# Patient Record
Sex: Female | Born: 1965
Health system: Southern US, Community
[De-identification: ages and names within clinical notes are randomized; demographics above are authoritative.]

## PROBLEM LIST (undated history)

## (undated) DIAGNOSIS — K219 Gastro-esophageal reflux disease without esophagitis: Secondary | ICD-10-CM

## (undated) DIAGNOSIS — K589 Irritable bowel syndrome without diarrhea: Secondary | ICD-10-CM

## (undated) DIAGNOSIS — E785 Hyperlipidemia, unspecified: Secondary | ICD-10-CM

## (undated) DIAGNOSIS — N939 Abnormal uterine and vaginal bleeding, unspecified: Secondary | ICD-10-CM

## (undated) DIAGNOSIS — K8020 Calculus of gallbladder without cholecystitis without obstruction: Secondary | ICD-10-CM

## (undated) DIAGNOSIS — F419 Anxiety disorder, unspecified: Secondary | ICD-10-CM

## (undated) DIAGNOSIS — I10 Essential (primary) hypertension: Secondary | ICD-10-CM

## (undated) DIAGNOSIS — E119 Type 2 diabetes mellitus without complications: Secondary | ICD-10-CM

## (undated) DIAGNOSIS — T7840XA Allergy, unspecified, initial encounter: Secondary | ICD-10-CM

## (undated) DIAGNOSIS — F909 Attention-deficit hyperactivity disorder, unspecified type: Secondary | ICD-10-CM

## (undated) HISTORY — DX: Gastro-esophageal reflux disease without esophagitis: K21.9

## (undated) HISTORY — DX: Hyperlipidemia, unspecified: E78.5

## (undated) HISTORY — DX: Allergy, unspecified, initial encounter: T78.40XA

## (undated) HISTORY — DX: Type 2 diabetes mellitus without complications: E11.9

---
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
## 1997-12-13 ENCOUNTER — Encounter: Admission: RE | Admit: 1997-12-13 | Discharge: 1997-12-13 | Payer: Self-pay | Admitting: Family Medicine

## 1998-01-26 ENCOUNTER — Encounter: Admission: RE | Admit: 1998-01-26 | Discharge: 1998-01-26 | Payer: Self-pay | Admitting: Family Medicine

## 1998-02-14 ENCOUNTER — Encounter: Admission: RE | Admit: 1998-02-14 | Discharge: 1998-02-14 | Payer: Self-pay | Admitting: *Deleted

## 1998-12-06 ENCOUNTER — Encounter: Admission: RE | Admit: 1998-12-06 | Discharge: 1998-12-06 | Payer: Self-pay | Admitting: Family Medicine

## 1999-01-31 ENCOUNTER — Encounter: Admission: RE | Admit: 1999-01-31 | Discharge: 1999-01-31 | Payer: Self-pay | Admitting: Family Medicine

## 1999-08-09 ENCOUNTER — Encounter: Admission: RE | Admit: 1999-08-09 | Discharge: 1999-08-09 | Payer: Self-pay | Admitting: Family Medicine

## 1999-09-18 ENCOUNTER — Encounter: Admission: RE | Admit: 1999-09-18 | Discharge: 1999-09-18 | Payer: Self-pay | Admitting: Family Medicine

## 1999-10-18 ENCOUNTER — Encounter: Admission: RE | Admit: 1999-10-18 | Discharge: 1999-10-18 | Payer: Self-pay | Admitting: Family Medicine

## 1999-11-14 ENCOUNTER — Encounter: Admission: RE | Admit: 1999-11-14 | Discharge: 1999-11-14 | Payer: Self-pay | Admitting: Family Medicine

## 1999-11-14 ENCOUNTER — Encounter: Admission: RE | Admit: 1999-11-14 | Discharge: 2000-02-12 | Payer: Self-pay | Admitting: *Deleted

## 1999-12-14 ENCOUNTER — Encounter: Admission: RE | Admit: 1999-12-14 | Discharge: 1999-12-14 | Payer: Self-pay | Admitting: Family Medicine

## 2000-02-07 ENCOUNTER — Encounter: Admission: RE | Admit: 2000-02-07 | Discharge: 2000-02-07 | Payer: Self-pay | Admitting: Family Medicine

## 2000-03-22 ENCOUNTER — Encounter: Admission: RE | Admit: 2000-03-22 | Discharge: 2000-03-22 | Payer: Self-pay | Admitting: Sports Medicine

## 2000-07-04 ENCOUNTER — Encounter: Admission: RE | Admit: 2000-07-04 | Discharge: 2000-07-04 | Payer: Self-pay | Admitting: Sports Medicine

## 2000-08-22 ENCOUNTER — Encounter: Admission: RE | Admit: 2000-08-22 | Discharge: 2000-08-22 | Payer: Self-pay | Admitting: Family Medicine

## 2001-01-13 ENCOUNTER — Encounter: Admission: RE | Admit: 2001-01-13 | Discharge: 2001-01-13 | Payer: Self-pay | Admitting: Family Medicine

## 2001-01-14 ENCOUNTER — Encounter: Admission: RE | Admit: 2001-01-14 | Discharge: 2001-01-14 | Payer: Self-pay | Admitting: Family Medicine

## 2001-01-29 ENCOUNTER — Inpatient Hospital Stay (HOSPITAL_COMMUNITY): Admission: AD | Admit: 2001-01-29 | Discharge: 2001-01-29 | Payer: Self-pay | Admitting: Obstetrics

## 2001-04-02 ENCOUNTER — Encounter: Admission: RE | Admit: 2001-04-02 | Discharge: 2001-04-02 | Payer: Self-pay | Admitting: Family Medicine

## 2001-08-22 ENCOUNTER — Encounter: Admission: RE | Admit: 2001-08-22 | Discharge: 2001-08-22 | Payer: Self-pay | Admitting: Family Medicine

## 2001-09-12 ENCOUNTER — Encounter: Admission: RE | Admit: 2001-09-12 | Discharge: 2001-09-12 | Payer: Self-pay | Admitting: Family Medicine

## 2001-12-26 ENCOUNTER — Encounter: Admission: RE | Admit: 2001-12-26 | Discharge: 2001-12-26 | Payer: Self-pay | Admitting: Family Medicine

## 2002-02-11 ENCOUNTER — Encounter: Admission: RE | Admit: 2002-02-11 | Discharge: 2002-02-11 | Payer: Self-pay | Admitting: Family Medicine

## 2002-03-09 ENCOUNTER — Inpatient Hospital Stay (HOSPITAL_COMMUNITY): Admission: AD | Admit: 2002-03-09 | Discharge: 2002-03-09 | Payer: Self-pay | Admitting: *Deleted

## 2002-03-20 ENCOUNTER — Encounter: Admission: RE | Admit: 2002-03-20 | Discharge: 2002-03-20 | Payer: Self-pay | Admitting: Family Medicine

## 2002-06-17 ENCOUNTER — Encounter: Admission: RE | Admit: 2002-06-17 | Discharge: 2002-06-17 | Payer: Self-pay | Admitting: Family Medicine

## 2002-08-08 ENCOUNTER — Emergency Department (HOSPITAL_COMMUNITY): Admission: EM | Admit: 2002-08-08 | Discharge: 2002-08-08 | Payer: Self-pay | Admitting: Emergency Medicine

## 2002-08-09 ENCOUNTER — Emergency Department (HOSPITAL_COMMUNITY): Admission: EM | Admit: 2002-08-09 | Discharge: 2002-08-09 | Payer: Self-pay | Admitting: Emergency Medicine

## 2002-11-16 ENCOUNTER — Encounter: Admission: RE | Admit: 2002-11-16 | Discharge: 2002-11-16 | Payer: Self-pay | Admitting: Family Medicine

## 2003-01-08 ENCOUNTER — Encounter: Admission: RE | Admit: 2003-01-08 | Discharge: 2003-01-08 | Payer: Self-pay | Admitting: Family Medicine

## 2003-02-19 ENCOUNTER — Emergency Department (HOSPITAL_COMMUNITY): Admission: EM | Admit: 2003-02-19 | Discharge: 2003-02-19 | Payer: Self-pay | Admitting: Emergency Medicine

## 2003-06-04 ENCOUNTER — Encounter: Admission: RE | Admit: 2003-06-04 | Discharge: 2003-06-04 | Payer: Self-pay | Admitting: Sports Medicine

## 2003-06-13 ENCOUNTER — Emergency Department (HOSPITAL_COMMUNITY): Admission: EM | Admit: 2003-06-13 | Discharge: 2003-06-13 | Payer: Self-pay | Admitting: Emergency Medicine

## 2003-07-08 ENCOUNTER — Observation Stay (HOSPITAL_COMMUNITY): Admission: EM | Admit: 2003-07-08 | Discharge: 2003-07-09 | Payer: Self-pay | Admitting: Emergency Medicine

## 2003-07-08 ENCOUNTER — Encounter: Payer: Self-pay | Admitting: Emergency Medicine

## 2003-07-16 ENCOUNTER — Ambulatory Visit: Admission: RE | Admit: 2003-07-16 | Discharge: 2003-07-16 | Payer: Self-pay | Admitting: Family Medicine

## 2003-07-19 ENCOUNTER — Encounter: Admission: RE | Admit: 2003-07-19 | Discharge: 2003-07-19 | Payer: Self-pay | Admitting: Family Medicine

## 2003-09-16 ENCOUNTER — Encounter: Admission: RE | Admit: 2003-09-16 | Discharge: 2003-09-16 | Payer: Self-pay | Admitting: Sports Medicine

## 2003-12-08 ENCOUNTER — Emergency Department (HOSPITAL_COMMUNITY): Admission: EM | Admit: 2003-12-08 | Discharge: 2003-12-08 | Payer: Self-pay | Admitting: Emergency Medicine

## 2003-12-09 ENCOUNTER — Encounter: Admission: RE | Admit: 2003-12-09 | Discharge: 2003-12-09 | Payer: Self-pay | Admitting: Family Medicine

## 2003-12-12 ENCOUNTER — Emergency Department (HOSPITAL_COMMUNITY): Admission: EM | Admit: 2003-12-12 | Discharge: 2003-12-12 | Payer: Self-pay | Admitting: *Deleted

## 2003-12-23 ENCOUNTER — Encounter: Admission: RE | Admit: 2003-12-23 | Discharge: 2003-12-23 | Payer: Self-pay | Admitting: Family Medicine

## 2004-02-29 ENCOUNTER — Encounter: Admission: RE | Admit: 2004-02-29 | Discharge: 2004-02-29 | Payer: Self-pay | Admitting: Family Medicine

## 2004-05-18 ENCOUNTER — Ambulatory Visit: Payer: Self-pay | Admitting: Family Medicine

## 2004-06-30 ENCOUNTER — Emergency Department (HOSPITAL_COMMUNITY): Admission: EM | Admit: 2004-06-30 | Discharge: 2004-06-30 | Payer: Self-pay | Admitting: Emergency Medicine

## 2004-07-10 ENCOUNTER — Encounter: Admission: RE | Admit: 2004-07-10 | Discharge: 2004-10-08 | Payer: Self-pay | Admitting: Family Medicine

## 2004-09-22 ENCOUNTER — Ambulatory Visit: Payer: Self-pay | Admitting: Family Medicine

## 2005-03-24 ENCOUNTER — Inpatient Hospital Stay (HOSPITAL_COMMUNITY): Admission: AD | Admit: 2005-03-24 | Discharge: 2005-03-24 | Payer: Self-pay | Admitting: Family Medicine

## 2005-03-29 ENCOUNTER — Ambulatory Visit: Payer: Self-pay

## 2005-06-01 ENCOUNTER — Emergency Department (HOSPITAL_COMMUNITY): Admission: EM | Admit: 2005-06-01 | Discharge: 2005-06-01 | Payer: Self-pay | Admitting: Emergency Medicine

## 2005-06-20 ENCOUNTER — Ambulatory Visit: Payer: Self-pay | Admitting: Family Medicine

## 2005-07-03 ENCOUNTER — Encounter: Admission: RE | Admit: 2005-07-03 | Discharge: 2005-07-03 | Payer: Self-pay | Admitting: Internal Medicine

## 2006-01-09 ENCOUNTER — Ambulatory Visit: Payer: Self-pay | Admitting: Family Medicine

## 2006-01-31 ENCOUNTER — Ambulatory Visit: Payer: Self-pay | Admitting: Family Medicine

## 2006-02-07 ENCOUNTER — Encounter (INDEPENDENT_AMBULATORY_CARE_PROVIDER_SITE_OTHER): Payer: Self-pay | Admitting: *Deleted

## 2006-02-07 LAB — CONVERTED CEMR LAB

## 2006-04-09 ENCOUNTER — Ambulatory Visit: Payer: Self-pay

## 2006-08-14 ENCOUNTER — Ambulatory Visit: Payer: Self-pay | Admitting: Sports Medicine

## 2006-09-17 ENCOUNTER — Ambulatory Visit: Payer: Self-pay | Admitting: Family Medicine

## 2006-09-25 ENCOUNTER — Ambulatory Visit: Payer: Self-pay | Admitting: Family Medicine

## 2006-10-04 ENCOUNTER — Encounter (INDEPENDENT_AMBULATORY_CARE_PROVIDER_SITE_OTHER): Payer: Self-pay | Admitting: Family Medicine

## 2006-10-04 ENCOUNTER — Ambulatory Visit: Payer: Self-pay | Admitting: Family Medicine

## 2006-10-17 ENCOUNTER — Ambulatory Visit: Payer: Self-pay | Admitting: Family Medicine

## 2006-10-31 DIAGNOSIS — F988 Other specified behavioral and emotional disorders with onset usually occurring in childhood and adolescence: Secondary | ICD-10-CM

## 2006-10-31 DIAGNOSIS — F900 Attention-deficit hyperactivity disorder, predominantly inattentive type: Secondary | ICD-10-CM

## 2006-10-31 DIAGNOSIS — F411 Generalized anxiety disorder: Secondary | ICD-10-CM

## 2006-10-31 DIAGNOSIS — F419 Anxiety disorder, unspecified: Secondary | ICD-10-CM | POA: Insufficient documentation

## 2006-10-31 HISTORY — DX: Attention-deficit hyperactivity disorder, predominantly inattentive type: F90.0

## 2006-11-01 ENCOUNTER — Encounter (INDEPENDENT_AMBULATORY_CARE_PROVIDER_SITE_OTHER): Payer: Self-pay | Admitting: *Deleted

## 2006-11-05 ENCOUNTER — Telehealth: Payer: Self-pay | Admitting: *Deleted

## 2006-11-13 ENCOUNTER — Ambulatory Visit: Payer: Self-pay | Admitting: Sports Medicine

## 2006-11-13 ENCOUNTER — Telehealth (INDEPENDENT_AMBULATORY_CARE_PROVIDER_SITE_OTHER): Payer: Self-pay | Admitting: *Deleted

## 2006-11-13 LAB — CONVERTED CEMR LAB: Hgb A1c MFr Bld: 7.1 %

## 2006-12-12 ENCOUNTER — Emergency Department (HOSPITAL_COMMUNITY): Admission: EM | Admit: 2006-12-12 | Discharge: 2006-12-12 | Payer: Self-pay | Admitting: Emergency Medicine

## 2006-12-12 ENCOUNTER — Telehealth (INDEPENDENT_AMBULATORY_CARE_PROVIDER_SITE_OTHER): Payer: Self-pay | Admitting: *Deleted

## 2006-12-13 ENCOUNTER — Telehealth: Payer: Self-pay | Admitting: *Deleted

## 2006-12-17 ENCOUNTER — Telehealth (INDEPENDENT_AMBULATORY_CARE_PROVIDER_SITE_OTHER): Payer: Self-pay | Admitting: Family Medicine

## 2006-12-17 ENCOUNTER — Ambulatory Visit: Payer: Self-pay | Admitting: Family Medicine

## 2006-12-17 DIAGNOSIS — Z794 Long term (current) use of insulin: Secondary | ICD-10-CM

## 2006-12-17 DIAGNOSIS — E119 Type 2 diabetes mellitus without complications: Secondary | ICD-10-CM | POA: Insufficient documentation

## 2006-12-17 HISTORY — DX: Type 2 diabetes mellitus without complications: Z79.4

## 2007-01-24 ENCOUNTER — Ambulatory Visit: Payer: Self-pay | Admitting: Family Medicine

## 2007-01-24 ENCOUNTER — Encounter: Payer: Self-pay | Admitting: *Deleted

## 2007-01-24 LAB — CONVERTED CEMR LAB: Whiff Test: NEGATIVE

## 2007-01-27 ENCOUNTER — Emergency Department (HOSPITAL_COMMUNITY): Admission: EM | Admit: 2007-01-27 | Discharge: 2007-01-27 | Payer: Self-pay | Admitting: Emergency Medicine

## 2007-01-31 ENCOUNTER — Ambulatory Visit: Payer: Self-pay | Admitting: Family Medicine

## 2007-02-04 ENCOUNTER — Telehealth (INDEPENDENT_AMBULATORY_CARE_PROVIDER_SITE_OTHER): Payer: Self-pay | Admitting: *Deleted

## 2007-02-16 ENCOUNTER — Telehealth (INDEPENDENT_AMBULATORY_CARE_PROVIDER_SITE_OTHER): Payer: Self-pay | Admitting: *Deleted

## 2007-02-19 ENCOUNTER — Encounter: Payer: Self-pay | Admitting: *Deleted

## 2007-02-19 ENCOUNTER — Telehealth: Payer: Self-pay | Admitting: *Deleted

## 2007-03-05 ENCOUNTER — Encounter (INDEPENDENT_AMBULATORY_CARE_PROVIDER_SITE_OTHER): Payer: Self-pay | Admitting: Family Medicine

## 2007-03-05 ENCOUNTER — Ambulatory Visit: Payer: Self-pay | Admitting: Family Medicine

## 2007-03-05 DIAGNOSIS — F3289 Other specified depressive episodes: Secondary | ICD-10-CM | POA: Insufficient documentation

## 2007-03-05 DIAGNOSIS — F329 Major depressive disorder, single episode, unspecified: Secondary | ICD-10-CM | POA: Insufficient documentation

## 2007-03-05 LAB — CONVERTED CEMR LAB
Hgb A1c MFr Bld: 11 %
Pap Smear: NORMAL
Pap Smear: NORMAL
Whiff Test: NEGATIVE

## 2007-03-06 ENCOUNTER — Telehealth: Payer: Self-pay | Admitting: *Deleted

## 2007-03-10 ENCOUNTER — Telehealth (INDEPENDENT_AMBULATORY_CARE_PROVIDER_SITE_OTHER): Payer: Self-pay | Admitting: *Deleted

## 2007-03-11 ENCOUNTER — Telehealth (INDEPENDENT_AMBULATORY_CARE_PROVIDER_SITE_OTHER): Payer: Self-pay | Admitting: Family Medicine

## 2007-03-11 ENCOUNTER — Encounter (INDEPENDENT_AMBULATORY_CARE_PROVIDER_SITE_OTHER): Payer: Self-pay | Admitting: Family Medicine

## 2007-03-19 ENCOUNTER — Telehealth (INDEPENDENT_AMBULATORY_CARE_PROVIDER_SITE_OTHER): Payer: Self-pay | Admitting: Family Medicine

## 2007-04-07 ENCOUNTER — Encounter (INDEPENDENT_AMBULATORY_CARE_PROVIDER_SITE_OTHER): Payer: Self-pay | Admitting: Family Medicine

## 2007-04-10 ENCOUNTER — Encounter (INDEPENDENT_AMBULATORY_CARE_PROVIDER_SITE_OTHER): Payer: Self-pay | Admitting: Family Medicine

## 2007-04-10 ENCOUNTER — Ambulatory Visit: Payer: Self-pay | Admitting: Family Medicine

## 2007-04-10 LAB — CONVERTED CEMR LAB: Whiff Test: NEGATIVE

## 2007-04-12 LAB — CONVERTED CEMR LAB
ALT: 12 units/L (ref 0–35)
AST: 11 units/L (ref 0–37)
Albumin: 4.3 g/dL (ref 3.5–5.2)
Alkaline Phosphatase: 59 units/L (ref 39–117)
BUN: 7 mg/dL (ref 6–23)
CO2: 24 meq/L (ref 19–32)
Calcium: 9.4 mg/dL (ref 8.4–10.5)
Chloride: 101 meq/L (ref 96–112)
Cholesterol: 202 mg/dL — ABNORMAL HIGH (ref 0–200)
Creatinine, Ser: 0.73 mg/dL (ref 0.40–1.20)
Glucose, Bld: 306 mg/dL — ABNORMAL HIGH (ref 70–99)
HDL: 62 mg/dL (ref >39)
LDL Cholesterol: 105 mg/dL — ABNORMAL HIGH (ref 0–99)
Potassium: 4.4 meq/L (ref 3.5–5.3)
Sodium: 137 meq/L (ref 135–145)
Total Bilirubin: 0.5 mg/dL (ref 0.3–1.2)
Total CHOL/HDL Ratio: 3.3
Total Protein: 7.3 g/dL (ref 6.0–8.3)
Triglycerides: 173 mg/dL — ABNORMAL HIGH (ref <150)
VLDL: 35 mg/dL (ref 0–40)

## 2007-06-23 ENCOUNTER — Telehealth: Payer: Self-pay | Admitting: *Deleted

## 2007-07-02 ENCOUNTER — Telehealth: Payer: Self-pay | Admitting: Psychology

## 2007-07-02 ENCOUNTER — Ambulatory Visit: Payer: Self-pay | Admitting: Family Medicine

## 2007-07-02 ENCOUNTER — Encounter (INDEPENDENT_AMBULATORY_CARE_PROVIDER_SITE_OTHER): Payer: Self-pay | Admitting: Family Medicine

## 2007-07-02 LAB — CONVERTED CEMR LAB: Hgb A1c MFr Bld: 8.9 %

## 2007-07-03 ENCOUNTER — Telehealth (INDEPENDENT_AMBULATORY_CARE_PROVIDER_SITE_OTHER): Payer: Self-pay | Admitting: Family Medicine

## 2007-07-03 LAB — CONVERTED CEMR LAB
Cholesterol: 171 mg/dL (ref 0–200)
HDL: 50 mg/dL (ref >39)
LDL Cholesterol: 99 mg/dL (ref 0–99)
Total CHOL/HDL Ratio: 3.4
Triglycerides: 111 mg/dL (ref <150)
VLDL: 22 mg/dL (ref 0–40)

## 2007-07-14 ENCOUNTER — Encounter: Payer: Self-pay | Admitting: Psychology

## 2007-07-22 ENCOUNTER — Telehealth: Payer: Self-pay | Admitting: *Deleted

## 2007-09-07 ENCOUNTER — Telehealth (INDEPENDENT_AMBULATORY_CARE_PROVIDER_SITE_OTHER): Payer: Self-pay | Admitting: Family Medicine

## 2007-09-08 ENCOUNTER — Ambulatory Visit: Payer: Self-pay | Admitting: Family Medicine

## 2007-09-08 LAB — CONVERTED CEMR LAB
Blood Glucose, AC Bkfst: 285 mg/dL
Whiff Test: NEGATIVE

## 2007-09-10 ENCOUNTER — Telehealth: Payer: Self-pay | Admitting: *Deleted

## 2007-09-18 ENCOUNTER — Encounter (INDEPENDENT_AMBULATORY_CARE_PROVIDER_SITE_OTHER): Payer: Self-pay | Admitting: Family Medicine

## 2007-09-18 ENCOUNTER — Encounter (INDEPENDENT_AMBULATORY_CARE_PROVIDER_SITE_OTHER): Payer: Self-pay | Admitting: *Deleted

## 2007-09-18 ENCOUNTER — Ambulatory Visit: Payer: Self-pay | Admitting: Family Medicine

## 2007-09-18 LAB — CONVERTED CEMR LAB: Hgb A1c MFr Bld: 9.3 %

## 2007-09-19 LAB — CONVERTED CEMR LAB
BUN: 7 mg/dL (ref 6–23)
CO2: 28 meq/L (ref 19–32)
Calcium: 9.8 mg/dL (ref 8.4–10.5)
Chloride: 102 meq/L (ref 96–112)
Creatinine, Ser: 0.79 mg/dL (ref 0.40–1.20)
Glucose, Bld: 132 mg/dL — ABNORMAL HIGH (ref 70–99)
Potassium: 4.6 meq/L (ref 3.5–5.3)
Sodium: 139 meq/L (ref 135–145)

## 2007-10-21 ENCOUNTER — Telehealth (INDEPENDENT_AMBULATORY_CARE_PROVIDER_SITE_OTHER): Payer: Self-pay | Admitting: Family Medicine

## 2007-10-21 ENCOUNTER — Encounter: Payer: Self-pay | Admitting: *Deleted

## 2007-10-22 ENCOUNTER — Telehealth (INDEPENDENT_AMBULATORY_CARE_PROVIDER_SITE_OTHER): Payer: Self-pay | Admitting: Family Medicine

## 2007-10-27 ENCOUNTER — Telehealth (INDEPENDENT_AMBULATORY_CARE_PROVIDER_SITE_OTHER): Payer: Self-pay | Admitting: Family Medicine

## 2007-11-24 ENCOUNTER — Ambulatory Visit: Payer: Self-pay | Admitting: Family Medicine

## 2007-11-24 LAB — CONVERTED CEMR LAB: Hgb A1c MFr Bld: 8.2 %

## 2007-11-25 ENCOUNTER — Telehealth (INDEPENDENT_AMBULATORY_CARE_PROVIDER_SITE_OTHER): Payer: Self-pay | Admitting: Family Medicine

## 2008-02-05 ENCOUNTER — Telehealth (INDEPENDENT_AMBULATORY_CARE_PROVIDER_SITE_OTHER): Payer: Self-pay | Admitting: *Deleted

## 2008-02-13 ENCOUNTER — Ambulatory Visit: Payer: Self-pay | Admitting: Family Medicine

## 2008-02-16 ENCOUNTER — Ambulatory Visit: Payer: Self-pay | Admitting: Family Medicine

## 2008-05-07 ENCOUNTER — Telehealth (INDEPENDENT_AMBULATORY_CARE_PROVIDER_SITE_OTHER): Payer: Self-pay | Admitting: Family Medicine

## 2008-05-25 ENCOUNTER — Encounter (INDEPENDENT_AMBULATORY_CARE_PROVIDER_SITE_OTHER): Payer: Self-pay | Admitting: *Deleted

## 2008-05-25 ENCOUNTER — Encounter: Payer: Self-pay | Admitting: *Deleted

## 2008-06-11 ENCOUNTER — Ambulatory Visit: Payer: Self-pay | Admitting: Family Medicine

## 2008-06-11 ENCOUNTER — Encounter (INDEPENDENT_AMBULATORY_CARE_PROVIDER_SITE_OTHER): Payer: Self-pay | Admitting: Family Medicine

## 2008-06-11 LAB — CONVERTED CEMR LAB
Bilirubin Urine: NEGATIVE
Blood in Urine, dipstick: NEGATIVE
Glucose, Urine, Semiquant: 500
Hgb A1c MFr Bld: 10.1 %
Ketones, urine, test strip: NEGATIVE
Nitrite: NEGATIVE
Pap Smear: NORMAL
Specific Gravity, Urine: 1.02
Urobilinogen, UA: 0.2
WBC Urine, dipstick: NEGATIVE
Whiff Test: POSITIVE
pH: 7.5

## 2008-06-18 ENCOUNTER — Encounter (INDEPENDENT_AMBULATORY_CARE_PROVIDER_SITE_OTHER): Payer: Self-pay | Admitting: Family Medicine

## 2008-09-10 ENCOUNTER — Encounter: Payer: Self-pay | Admitting: Family Medicine

## 2008-09-10 ENCOUNTER — Ambulatory Visit: Payer: Self-pay | Admitting: Family Medicine

## 2008-09-10 LAB — CONVERTED CEMR LAB
ALT: 11 units/L (ref 0–35)
AST: 11 units/L (ref 0–37)
Albumin: 4.3 g/dL (ref 3.5–5.2)
Alkaline Phosphatase: 52 units/L (ref 39–117)
Bilirubin, Direct: 0.1 mg/dL (ref 0.0–0.3)
Hgb A1c MFr Bld: 9.2 %
Indirect Bilirubin: 0.3 mg/dL (ref 0.0–0.9)
Total Bilirubin: 0.4 mg/dL (ref 0.3–1.2)
Total Protein: 6.9 g/dL (ref 6.0–8.3)

## 2008-09-14 ENCOUNTER — Encounter: Payer: Self-pay | Admitting: Family Medicine

## 2008-11-10 ENCOUNTER — Telehealth: Payer: Self-pay | Admitting: Family Medicine

## 2009-02-18 ENCOUNTER — Encounter (INDEPENDENT_AMBULATORY_CARE_PROVIDER_SITE_OTHER): Payer: Self-pay | Admitting: Family Medicine

## 2009-02-18 ENCOUNTER — Ambulatory Visit: Payer: Self-pay | Admitting: Family Medicine

## 2009-02-18 ENCOUNTER — Telehealth (INDEPENDENT_AMBULATORY_CARE_PROVIDER_SITE_OTHER): Payer: Self-pay | Admitting: Family Medicine

## 2009-02-18 LAB — CONVERTED CEMR LAB
Hgb A1c MFr Bld: 8.8 %
Whiff Test: NEGATIVE

## 2009-02-19 LAB — CONVERTED CEMR LAB
Cholesterol: 186 mg/dL (ref 0–200)
HDL: 55 mg/dL (ref >39)
LDL Cholesterol: 109 mg/dL — ABNORMAL HIGH (ref 0–99)
Total CHOL/HDL Ratio: 3.4
Triglycerides: 111 mg/dL (ref <150)
VLDL: 22 mg/dL (ref 0–40)

## 2009-02-26 ENCOUNTER — Encounter (INDEPENDENT_AMBULATORY_CARE_PROVIDER_SITE_OTHER): Payer: Self-pay | Admitting: Family Medicine

## 2009-03-01 ENCOUNTER — Telehealth: Payer: Self-pay | Admitting: Family Medicine

## 2009-04-07 ENCOUNTER — Inpatient Hospital Stay (HOSPITAL_COMMUNITY): Admission: AD | Admit: 2009-04-07 | Discharge: 2009-04-07 | Payer: Self-pay | Admitting: Obstetrics & Gynecology

## 2009-04-08 ENCOUNTER — Telehealth: Payer: Self-pay | Admitting: *Deleted

## 2009-04-13 ENCOUNTER — Ambulatory Visit: Payer: Self-pay | Admitting: Family Medicine

## 2009-04-13 DIAGNOSIS — E669 Obesity, unspecified: Secondary | ICD-10-CM

## 2009-04-13 HISTORY — DX: Obesity, unspecified: E66.9

## 2009-05-01 ENCOUNTER — Telehealth: Payer: Self-pay | Admitting: Family Medicine

## 2009-05-04 ENCOUNTER — Encounter: Payer: Self-pay | Admitting: Family Medicine

## 2009-05-13 ENCOUNTER — Telehealth: Payer: Self-pay | Admitting: Family Medicine

## 2009-05-15 ENCOUNTER — Emergency Department (HOSPITAL_COMMUNITY): Admission: EM | Admit: 2009-05-15 | Discharge: 2009-05-15 | Payer: Self-pay | Admitting: Emergency Medicine

## 2009-05-18 ENCOUNTER — Ambulatory Visit: Payer: Self-pay | Admitting: Family Medicine

## 2009-05-18 LAB — CONVERTED CEMR LAB: Hgb A1c MFr Bld: 8.4 %

## 2009-05-30 ENCOUNTER — Encounter: Payer: Self-pay | Admitting: Family Medicine

## 2009-06-08 ENCOUNTER — Encounter: Payer: Self-pay | Admitting: Family Medicine

## 2009-07-07 ENCOUNTER — Encounter: Payer: Self-pay | Admitting: Family Medicine

## 2009-07-07 ENCOUNTER — Ambulatory Visit: Payer: Self-pay | Admitting: Family Medicine

## 2009-07-07 DIAGNOSIS — I10 Essential (primary) hypertension: Secondary | ICD-10-CM

## 2009-07-07 HISTORY — DX: Essential (primary) hypertension: I10

## 2009-07-07 LAB — CONVERTED CEMR LAB
ALT: 16 units/L (ref 0–35)
AST: 15 units/L (ref 0–37)
Albumin: 4.2 g/dL (ref 3.5–5.2)
Alkaline Phosphatase: 53 units/L (ref 39–117)
BUN: 7 mg/dL (ref 6–23)
Beta hcg, urine, semiquantitative: NEGATIVE
CO2: 23 meq/L (ref 19–32)
Calcium: 9.3 mg/dL (ref 8.4–10.5)
Chloride: 103 meq/L (ref 96–112)
Creatinine, Ser: 0.76 mg/dL (ref 0.40–1.20)
Glucose, Bld: 289 mg/dL — ABNORMAL HIGH (ref 70–99)
HCT: 38.2 % (ref 36.0–46.0)
Hemoglobin: 12.2 g/dL (ref 12.0–15.0)
MCHC: 31.9 g/dL (ref 30.0–36.0)
MCV: 87 fL (ref 78.0–100.0)
Platelets: 383 10*3/uL (ref 150–400)
Potassium: 4.7 meq/L (ref 3.5–5.3)
RBC: 4.39 M/uL (ref 3.87–5.11)
RDW: 13 % (ref 11.5–15.5)
Sodium: 137 meq/L (ref 135–145)
Total Bilirubin: 0.4 mg/dL (ref 0.3–1.2)
Total Protein: 7.1 g/dL (ref 6.0–8.3)
WBC: 6.3 10*3/uL (ref 4.0–10.5)

## 2009-07-20 ENCOUNTER — Ambulatory Visit: Payer: Self-pay | Admitting: Family Medicine

## 2009-08-04 ENCOUNTER — Encounter: Payer: Self-pay | Admitting: Family Medicine

## 2009-08-04 ENCOUNTER — Ambulatory Visit: Payer: Self-pay | Admitting: Family Medicine

## 2009-08-04 ENCOUNTER — Telehealth: Payer: Self-pay | Admitting: Family Medicine

## 2009-08-04 LAB — CONVERTED CEMR LAB
Beta hcg, urine, semiquantitative: NEGATIVE
Bilirubin Urine: NEGATIVE
Blood in Urine, dipstick: NEGATIVE
Glucose, Urine, Semiquant: NEGATIVE
Ketones, urine, test strip: NEGATIVE
Nitrite: NEGATIVE
Protein, U semiquant: NEGATIVE
Specific Gravity, Urine: 1.02
Urobilinogen, UA: 0.2
WBC Urine, dipstick: NEGATIVE
pH: 7

## 2009-08-05 ENCOUNTER — Encounter: Payer: Self-pay | Admitting: Family Medicine

## 2009-08-05 ENCOUNTER — Ambulatory Visit: Payer: Self-pay | Admitting: Family Medicine

## 2009-08-05 ENCOUNTER — Ambulatory Visit (HOSPITAL_COMMUNITY): Admission: RE | Admit: 2009-08-05 | Discharge: 2009-08-05 | Payer: Self-pay | Admitting: Family Medicine

## 2009-08-05 LAB — CONVERTED CEMR LAB
HCT: 36.3 % (ref 36.0–46.0)
Hemoglobin: 12.4 g/dL (ref 12.0–15.0)
MCHC: 34.2 g/dL (ref 30.0–36.0)
MCV: 85.4 fL (ref 78.0–100.0)
Platelets: 346 10*3/uL (ref 150–400)
RBC: 4.25 M/uL (ref 3.87–5.11)
RDW: 13.1 % (ref 11.5–15.5)
WBC: 6.3 10*3/uL (ref 4.0–10.5)

## 2009-08-15 ENCOUNTER — Ambulatory Visit (HOSPITAL_COMMUNITY): Admission: RE | Admit: 2009-08-15 | Discharge: 2009-08-15 | Payer: Self-pay | Admitting: Family Medicine

## 2009-09-19 ENCOUNTER — Encounter: Payer: Self-pay | Admitting: Family Medicine

## 2009-11-01 ENCOUNTER — Emergency Department (HOSPITAL_COMMUNITY): Admission: EM | Admit: 2009-11-01 | Discharge: 2009-11-01 | Payer: Self-pay | Admitting: Emergency Medicine

## 2009-11-02 ENCOUNTER — Ambulatory Visit: Payer: Self-pay | Admitting: Radiology

## 2009-11-02 ENCOUNTER — Emergency Department (HOSPITAL_BASED_OUTPATIENT_CLINIC_OR_DEPARTMENT_OTHER): Admission: EM | Admit: 2009-11-02 | Discharge: 2009-11-02 | Payer: Self-pay | Admitting: Emergency Medicine

## 2009-12-15 ENCOUNTER — Ambulatory Visit (HOSPITAL_COMMUNITY): Admission: RE | Admit: 2009-12-15 | Discharge: 2009-12-15 | Payer: Self-pay | Admitting: Orthopedic Surgery

## 2009-12-29 ENCOUNTER — Encounter: Admission: RE | Admit: 2009-12-29 | Discharge: 2010-03-29 | Payer: Self-pay | Admitting: Orthopedic Surgery

## 2010-02-03 ENCOUNTER — Ambulatory Visit: Payer: Self-pay | Admitting: Family Medicine

## 2010-02-03 LAB — CONVERTED CEMR LAB: Hgb A1c MFr Bld: 8.4 %

## 2010-04-14 ENCOUNTER — Telehealth: Payer: Self-pay | Admitting: Family Medicine

## 2010-04-19 ENCOUNTER — Encounter: Payer: Self-pay | Admitting: Family Medicine

## 2010-05-02 ENCOUNTER — Telehealth: Payer: Self-pay | Admitting: *Deleted

## 2010-05-05 ENCOUNTER — Ambulatory Visit: Payer: Self-pay | Admitting: Family Medicine

## 2010-05-05 LAB — CONVERTED CEMR LAB: Whiff Test: NEGATIVE

## 2010-06-08 ENCOUNTER — Encounter: Payer: Self-pay | Admitting: Family Medicine

## 2010-06-09 ENCOUNTER — Encounter: Payer: Self-pay | Admitting: Family Medicine

## 2010-06-13 ENCOUNTER — Encounter: Payer: Self-pay | Admitting: Family Medicine

## 2010-06-22 ENCOUNTER — Encounter: Payer: Self-pay | Admitting: Family Medicine

## 2010-07-04 ENCOUNTER — Telehealth: Payer: Self-pay | Admitting: Psychology

## 2010-07-04 ENCOUNTER — Ambulatory Visit: Payer: Self-pay | Admitting: Family Medicine

## 2010-07-04 LAB — CONVERTED CEMR LAB
Glucose, Urine, Semiquant: 100
Hgb A1c MFr Bld: 9.5 %
Protein, U semiquant: NEGATIVE

## 2010-08-08 ENCOUNTER — Telehealth: Payer: Self-pay | Admitting: *Deleted

## 2010-08-25 ENCOUNTER — Telehealth: Payer: Self-pay | Admitting: Sports Medicine

## 2010-09-24 ENCOUNTER — Encounter: Payer: Self-pay | Admitting: Family Medicine

## 2010-09-28 ENCOUNTER — Telehealth: Payer: Self-pay | Admitting: Family Medicine

## 2010-09-28 ENCOUNTER — Encounter: Payer: Self-pay | Admitting: Family Medicine

## 2010-10-02 ENCOUNTER — Telehealth: Payer: Self-pay | Admitting: *Deleted

## 2010-10-02 ENCOUNTER — Telehealth: Payer: Self-pay | Admitting: Family Medicine

## 2010-10-03 NOTE — Progress Notes (Signed)
Summary: Triage  Phone Note Call from Patient Call back at Home Phone [phone]   Summary of Call: Pt wants to speak with a nurse about feeling sick still.  Initial call taken by: [name],  September 10, 2007 2:44 PM  Follow-up for Phone Call        pt is calling about getting a doctor's note, sts she is still out of work and needs a work excuse note b/c she is sick Follow-up by: Fontaine No,  September 10, 2007 3:51 PM  Additional Follow-up for Phone Call Additional follow up Details #1::        message left to return call. Additional Follow-up by: Theresia Lo RN,  September 10, 2007 4:04 PM    Additional Follow-up for Phone Call Additional follow up Details #2::    pt came by office requesting note to be out of work today as she has not felt any better. states sugars continue to be elevated. continues with diarrhea.Marland Kitchen advised to push fluids. Consulted with Dr. Sheffield Slider and advises an additional note can be given for today  for being out of work. pt plans to return to work tomorrow. she will call tomorrow if not any better. Follow-up by: Theresia Lo RN,  September 10, 2007 4:38 PM

## 2010-10-03 NOTE — Progress Notes (Signed)
Summary: On call note - increased BP and syncope  Phone Note Call from Patient Call back at [facility] Phone [phone]   Caller: Patient Call For: On call physician  Summary of Call: 45 yo with DM. Sugars are in 300s. Had brief syncopal episode around 1:40 today. None since that time. No CP. No SOB. blood sugar checked at that time and found to be 350s. Took 20 units of her mom's levemir. Ran out of lantus on Friday. Did not take any insulin on Sat. Unable to afford Byetta. Taking metforming two times a day. Thinks she has a yeast infection and wants to be treated but was advised to come in for a swab by Dr. [name] prior to treatment. Unable to afford solostar pen. Drinking lots of water. Very thirsty. Current blood sugar is 338. Advised to take another 10 units of levemir as well as normal dose of Metformin and to schedule appointment in AM for clinic. If has another syncopal spell, feeling worse, or CP or SOB is to come into the ED tonight.  Initial call taken by: Alanda Amass MD,  September 07, 2007 9:11 PM

## 2010-10-03 NOTE — Assessment & Plan Note (Signed)
Summary: elevated blood sugar/ACM   Vital Signs:  Patient Profile:   45 Years Old Female Weight:      165.7 pounds Temp:     97.7 degrees F Pulse rate:   85 / minute BP sitting:   115 / 76  Pt. in pain?   no  Vitals Entered By: Dedra Skeens CMA, (November 13, 2006 2:25 PM)                Diabetes Management History:      The patient is a 45 years old female who comes in for evaluation of DM Type 2.  She states lack of understanding of dietary principles and is not following her diet appropriately.  No sensory loss is reported.  She is checking home blood sugars.        Hypoglycemic symptoms are not occurring.  No hyperglycemic symptoms are reported.        There are no symptoms to suggest diabetic complications.  Other questions/concerns include: Pt complains of BS running in 170-200 fasting for the last few months.  Has been using Byetta 10 two times a day and metformin 500 two times a day.  The following changes have been made to her treatment plan since last visit: insulin dosing and medication changes.  Treatment plan changes were initiated by D.E.P..  Dietary compliance is reported to be good.          Physical Exam  General:     Well-developed,well-nourished,in no acute distress; alert,appropriate and cooperative throughout examination Head:     Normocephalic and atraumatic without obvious abnormalities. No apparent alopecia or balding. Eyes:     vision grossly intact and pupils equal.   Lungs:     Normal respiratory effort, chest expands symmetrically. Lungs are clear to auscultation, no crackles or wheezes. Heart:     Normal rate and regular rhythm. S1 and S2 normal without gallop, murmur, click, rub or other extra sounds. Neurologic:     alert & oriented X3 and gait normal.   Psych:     Cognition and judgment appear intact. Alert and cooperative with normal attention span and concentration. No apparent delusions, illusions, hallucinations    Impression &  Recommendations:  Problem # 1:  AODM (ICD-250.00) Assessment: Unchanged Pt doing well. close to goal A1C despited reported high sugars. would like to see fasting CBG even lower.  Will try to increase metformin to 1000 two times a day. no change to byetta at this time. Pt to follow up with Dr. [name] for further adjustment of meds.  Spent good amoutn of time on counseling pt on carbohydrates and how they afffect diabetes. she was also given a handout.  If her understanding of carbohydrates is still limited at next visit, she may benefit from nutrition counseling. Orders: Hgb-A1C-[facility] (11914) [facility]- Est Level  3 ([address])   Diabetes Management Assessment/Plan:      The following lipid goals have been established for the patient: Total cholesterol goal of 200; LDL cholesterol goal of 100; HDL cholesterol goal of 40; Triglyceride goal of 200.     Patient Instructions: 1)  Please schedule a follow-up appointment in 1 month with Dr. [name]   Laboratory Results   Blood Tests   Date/Time Recieved: November 13, 2006 2:34 PM  Date/Time Reported: November 13, 2006 2:40 PM   HGBA1C: 7.1%   (Normal Range: Non-Diabetic - 3-6%   Control Diabetic - 6-8%)  CBC

## 2010-10-03 NOTE — Assessment & Plan Note (Signed)
Summary: taking NPH but not working as well  Phone Note Call from Patient Call back at [phone]   Caller: Patient Summary of Call: Would like to talk to a nurse concerning her insulin.  Follow-up for Phone Call        Tracy Weiss comes to office to discuss insulins. states her BS have been running 300-400. she has been unable to afford  lantus which costs $100.00  so she has been using NPH insulin  instead  because MD told her she could do this. she can buy  this for $25.00 she states. she takes 60 units twice daily.  states BS ran much better with lantus  . also she has no novolog either and has been using regular insulin based on a sliding scale she has. she is asking for samples of Lantus and Novolog. states BS ran much better when she takes Lantus instead of NPH.  gave her 1 sample today of   lantus and Novolog each. these are on her med list . the lantus will last 6 days and she will come back next week for more sample if available.   patient has appointment on 07/04/2010. since patient is unable to afford Lantus and can better afford NPH will ask MD to  adjust that dose  at next visit. patient had been taking 60 units twice daily of NPH. also will ask MD to enter on med list if she does change her to NPH and regular insulin.    patient brought in a vial of 70/30 mix  that she had ask pharmacist about.  advised her not to use this. needs to discuss this with MD and to work out a regime that she can afford.   consulted  and discussed all above with Dr. Gerri Spore, preceptor. Follow-up by: Theresia Lo RN,  June 22, 2010 11:35 AM  Additional Follow-up for Phone Call Additional follow up Details #1::        noted, this patient would likely benefit from working with pharmacy clinic to get her insulin adjusted properly.  Additional Follow-up by: Jamie Brookes MD,  June 24, 2010 6:01 PM    lantus 1 sample lot # [id number] exp 11/2011, Novolog 1 sample lot # [id number] exp 7/ 2012. Theresia Lo RN   June 22, 2010 11:59 AM

## 2010-10-03 NOTE — Progress Notes (Signed)
  Phone Note Call from Patient   Caller: Patient Summary of Call: Want a call back to inform her of what shots she's had from [facility].  She need the info by the 6th.  Have an appt with her doctor's office on 9/2, but don't want to wait until then to know info.  [phone] Initial call taken by: Britta Mccreedy mcgregor  Follow-up for Phone Call        told her we will have it when she comes in Fri. she wants it done now. unable to get on [facility]. told her I will keep trying & will call her when printed. I confirmed her appt on friday Follow-up by: Golden Circle RN,  May 02, 2010 4:45 PM  Additional Follow-up for Phone Call Additional follow up Details #1::         patient had an adult DT 06/03/2002 and Hep B #1 02/13/2008. there is no [place] in [place] attempted to call patient back at number  left, but no answer. Additional Follow-up by: Theresia Lo RN,  May 02, 2010 5:06 PM    Additional Follow-up for Phone Call Additional follow up Details #2::    spoke with patient and gave  her information. when she comes in Friday will need to continue the hep B series , needs tdap and she states she needs MMR .

## 2010-10-03 NOTE — Progress Notes (Signed)
Summary: [place] request  Phone Note Call from Patient Call back at Home Phone [phone]   Summary of Call: pt is wanting to speak with someone about a possible yeast infection Initial call taken by: [name],  March 10, 2007 8:47 AM  Follow-up for Phone Call        Spoke with pt regarding her vaginal itching - she states that she has been experiencing severe vaginal itching for 3 weeks - had a wet prep done last week here in office that was negative - but still has severe itching.  Worked her in with Dr. Beverely Low tomorrow. Follow-up by: AMY MARTIN RN,  March 10, 2007 9:14 AM

## 2010-10-03 NOTE — Miscellaneous (Signed)
Summary: dnka/ts  Clinical Lists Changes 

## 2010-10-03 NOTE — Letter (Signed)
Summary: Results Follow-up Letter  [facility]  [address]   [place], [place] [address]   Phone: [phone]  Fax: [phone]    09/14/2008  [address] [place], [place]  [address]  Dear Ms. Sugarman,   Your liver tests came back normal.  This is good news.  I hope you are feeling better.  Please call with any questions.  Sincerely,  Ruthe Mannan MD [facility]           Appended Document: Results Follow-up Letter mailed

## 2010-10-03 NOTE — Progress Notes (Signed)
Summary: Request mental health services  Phone Note Call from Patient   Caller: Patient Call For: Spero Geralds, Psy.D. Summary of Call: Patient called and left a VM to schedule an appt.  Called back and left a VM.  Will await follow-up. Initial call taken by: Spero Geralds PsyD,  July 04, 2010 4:57 PM

## 2010-10-03 NOTE — Progress Notes (Signed)
Summary: returning Amy's call  Phone Note Outgoing Call   Details for Reason: appt Summary of Call: I recommend an upcoming appointment to f/u on diabetes.  I understand finances are a concern.   Pt should call Rudell Cobb to discuss the [facility] plan if she will be uninsured.   In the meantime, if pt does not want to come for f/u visit, she should check her sugars fasting each morning and one time daily 2 hours post prandial and WRITE THEM DOWN for a week.  She may then drop off log and I can review them and change meds based on what I see.  Please have Keniya write down what her dose of Lantus is on these days as she should be self adjusting.  Pt should continue exercise and diet as per our previous discussions.  Please notify her of this as I will be away until Wed. Initial call taken by: Johney Maine MD,  October 22, 2007 8:00 PM  Follow-up for Phone Call        Left pt a voicemail to return my call. Follow-up by: AMY MARTIN RN,  October 23, 2007 12:04 PM  Additional Follow-up for Phone Call Additional follow up Details #1::        Left pt a voicemail to return my call. Additional Follow-up by: AMY MARTIN RN,  October 24, 2007 12:02 PM    Additional Follow-up for Phone Call Additional follow up Details #2::    pt is returning call Follow-up by: ERIN LEVAN,  October 24, 2007 12:09 PM  Additional Follow-up for Phone Call Additional follow up Details #3:: Details for Additional Follow-up Action Taken: Spoke with pt advised her of above schedule for checking her blood sugars and dropping log off here for Dr. [name].  Pt states she needs a new rx with the adjusted lantus dose - (pt now taking 50 units per day) because she has been out for approx 2 weeks due to increasing her dose.  Advised pt Dr. [name] is out of town until next week, but I gave her 2 lantus solostar pens so that she would have the med until Dr. [name] gets back and does her rx.  Pt to pick up lantus today before 5 pm.   12:55pm: Amy, I went ahead and refilled her [facility] [address] ([facility] sent there) and increased the dose to 50 units daily.  Please let her know  that this has been done. Also, tell her to call 5 days before running out as can be dangerous to be out of insulin.  Thanks!  Jess Additional Follow-up by: AMY MARTIN RN,  October 24, 2007 12:20 PM  New/Updated Medications: LANTUS SOLOSTAR 100 UNIT/ML  SOLN (INSULIN GLARGINE) inject 50 units subcutaneously once daily   Prescriptions: LANTUS SOLOSTAR 100 UNIT/ML  SOLN (INSULIN GLARGINE) inject 50 units subcutaneously once daily  #999 x 6   Entered and Authorized by:   Johney Maine MD   Signed by:   Johney Maine MD on 10/24/2007   Method used:   Electronically sent to ...       [facility] [address] [phone]*       [address]       [place], [place]  [address]       Ph: [phone] or [phone]       Fax: [phone]   RxID:   [id number]    Spoke with pt- advised  her rx was sent - and also to call in advance of running out of insulin because this can be very dangerous for her.  Pt expersses understanding. ..................................................................Marland KitchenAMY MARTIN RN  October 24, 2007 2:45 PM

## 2010-10-03 NOTE — Progress Notes (Signed)
Summary: Forms for work  Phone Note Call from Patient Call back at [employer] [phone]   Summary of Call: is wanting to speak with Dr [name] about form that needs to be filled out for work - states form is a protection form to not lose job due to diabetes Initial call taken by: [name],  December 17, 2006 9:45 AM  Follow-up for Phone Call        I need to review pt's chart b/c I do not know if she meets criteria for FMLA forms.

## 2010-10-03 NOTE — Assessment & Plan Note (Signed)
Summary: reaction to meds,tcb   Vital Signs:  Patient profile:   45 year old female Height:      61.75 inches Weight:      179.56 pounds Temp:     98.1 degrees F oral Pulse rate:   90 / minute BP sitting:   125 / 86  (right arm)  Vitals Entered By: Terese Door (May 18, 2009 3:37 PM) CC: reaction to Lisinopril (cough) Is Patient Diabetic? Yes  Pain Assessment Patient in pain? no        Primary Care Provider:  Jamie Brookes MD  CC:  reaction to Lisinopril (cough).  History of Present Illness: Cough: Pt has had coughing for about 4 weeks now. She noticed that her coughing decreased when she came off of Lisinopril so she has not taking it in about 3 weeks. She is still coughing even though she has been off the ACE-I. She was previously switched to an ARB and cold be having a cross reaction but this is unlikely. This cough has been keeping her up at night. She was seen in the ER last week (Sept 12) for the cough, fever, chills, sinus pressure and congestion. Pt was Strep neg and had a neg CRX. SHe was given a Rx for Amox x 10 days, and Guiatuss and Sudafed. She says these did not help the cough much. She fininshed the complete course.     Obesity/Weight Loss:  Pt has been trying to lose weight and says that since she started Welbutrin she has not lost any weight but that she feels well and her mood is good. She wants to know if there is anything else she can try for weight loss. Suggested trying over-the-counter Alli for weight loss when eating high fat meals. Warned her about oily stools and increased flatulance. She will consider it.   Habits & Providers  Alcohol-Tobacco-Diet     Tobacco Status: quit > 6 months  Current Medications (verified): 1)  Bayer Childrens Aspirin 81 Mg Chew (Aspirin) .... Take 1 Tablet By Mouth Once A Day 2)  Metformin Hcl 1000 Mg Tabs (Metformin Hcl) .Marland Kitchen.. 1 Tab By Mouth Two Times A Day 3)  Losartan Potassium 50 Mg Tabs (Losartan Potassium) ....  Take One Pill Daily 4)  Lantus 100 Unit/ml Soln (Insulin Glargine) .... 80 U Subcutaneously Two Times A Day 5)  Insulin Syringes .... Use Once Daily 6)  Relion Blood Glucose Test  Strp (Glucose Blood) .... Use As Directed 7)  Relion Ultra Thin Lancets  Misc (Lancets) .... Use As Directed 8)  Lantus Solostar 100 Unit/ml Soln (Insulin Glargine) .... 80 Units Subcutaneously Two Times A Day or As Directed 9)  Metronidazole 0.75 % Gel (Metronidazole) .Marland Kitchen.. 1 Applicatorful Per Vagina X 5 Days 10)  Bupropion Hcl 300 Mg Xr24h-Tab (Bupropion Hcl) .... Take One Pill in The Morning. 11)  Budeprion Xl 150 Mg Xr24h-Tab (Bupropion Hcl) .... Take One Pill Every Morning 12)  Alli 60 Mg Caps (Orlistat) .... Take Two Pills With Your Calorie Dense Meals  Allergies (verified): No Known Drug Allergies  Social History: Smoking Status:  quit > 6 months  Review of Systems        vitals reviewed and pertinent negatives and positives seen in HPI   Physical Exam  General:  overweight, well-hydrated.   Head:  Normocephalic and atraumatic without obvious abnormalities. No apparent alopecia or balding. Ears:  External ear exam shows no significant lesions or deformities.  Otoscopic examination reveals clear canals, tympanic  membranes are intact bilaterally without bulging, retraction, inflammation or discharge. Hearing is grossly normal bilaterally. Nose:  External nasal examination shows no deformity or inflammation. Nasal mucosa are pink and moist without lesions or exudates. Mouth:  Oral mucosa and oropharynx without lesions or exudates.  Teeth in good repair. Lungs:  Normal respiratory effort, chest expands symmetrically. Lungs are clear to auscultation, no crackles or wheezes. Heart:  Normal rate and regular rhythm. S1 and S2 normal without gallop, murmur, click, rub or other extra sounds.   Impression & Recommendations:  Problem # 1:  COUGH (ICD-786.2) Assessment Deteriorated PT has had this cough for  3-4 weeks now. She was evaluated in the ED. Thought to have sinusitis and a cough due to an ACE-I and sent home with a Rx for Amox which she completed. She is still coughing. ACE-I cough can take up to 3-4 weeks to resolve. She should soon begin to see some improvement. If there is none, I will consider stopping the ARB as it may have some crossreactivity. Recommended Delsem for cough.   Orders: [facility]- Est Level  3 ([address])  Problem # 2:  OBESITY (ICD-278.00) Assessment: Unchanged Pt is still quite concerned about her weight and wants to lose weight. I recommended Alli (can get over the counter) and since she is doing well ont he Welbutrin we could go up on that. Discussed exercise and diet as well.   Orders: [facility]- Est Level  3 ([address])  Complete Medication List: 1)  Bayer Childrens Aspirin 81 Mg Chew (Aspirin) .... Take 1 tablet by mouth once a day 2)  Metformin Hcl 1000 Mg Tabs (Metformin hcl) .Marland Kitchen.. 1 tab by mouth two times a day 3)  Losartan Potassium 50 Mg Tabs (Losartan potassium) .... Take one pill daily 4)  Lantus 100 Unit/ml Soln (Insulin glargine) .... 80 u subcutaneously two times a day 5)  Insulin Syringes  .... Use once daily 6)  Relion Blood Glucose Test Strp (Glucose blood) .... Use as directed 7)  Relion Ultra Thin Lancets Misc (Lancets) .... Use as directed 8)  Lantus Solostar 100 Unit/ml Soln (Insulin glargine) .... 80 units subcutaneously two times a day or as directed 9)  Metronidazole 0.75 % Gel (Metronidazole) .Marland Kitchen.. 1 applicatorful per vagina x 5 days 10)  Bupropion Hcl 300 Mg Xr24h-tab (Bupropion hcl) .... Take one pill in the morning. 11)  Budeprion Xl 150 Mg Xr24h-tab (Bupropion hcl) .... Take one pill every morning 12)  Alli 60 Mg Caps (Orlistat) .... Take two pills with your calorie dense meals  Other Orders: A1C-[facility] (09811)  Patient Instructions: 1)  Use Delsem for cough suppressant 2)  Use Alli for calorie dense meals 3)  Continue exercising 4)  increase  Wellbutrin 5)  Have a ton of fun on vacation for me! Prescriptions: LOSARTAN POTASSIUM 50 MG TABS (LOSARTAN POTASSIUM) take one pill daily  #34 x 2   Entered and Authorized by:   Jamie Brookes MD   Signed by:   Jamie Brookes MD on 05/18/2009   Method used:   Electronically to        [facility] [phone]* (retail)       [address]       [place], [place]  [address]       Ph: [phone]       Fax: [phone]   RxID:   [id number] ALLI 60 MG CAPS (ORLISTAT) take two pills with your calorie dense meals  #60 x 2   Entered and  Authorized by:   Jamie Brookes MD   Signed by:   Jamie Brookes MD on 05/18/2009   Method used:   Electronically to        [facility] [phone]* (retail)       [address]       [place], [place]  [address]       Ph: [phone]       Fax: [phone]   RxID:   [id number] BUPROPION HCL 300 MG XR24H-TAB (BUPROPION HCL) take one pill in the morning.  #34 x 1   Entered and Authorized by:   Jamie Brookes MD   Signed by:   Jamie Brookes MD on 05/18/2009   Method used:   Electronically to        [facility] [phone]* (retail)       [address]       [place], [place]  [address]       Ph: [phone]       Fax: [phone]   RxID:   [id number]   Laboratory Results   Blood Tests   Date/Time Received: May 18, 2009 3:39 PM  Date/Time Reported: May 18, 2009 3:55 PM   HGBA1C: 8.4%   (Normal Range: Non-Diabetic - 3-6%   Control Diabetic - 6-8%)  Comments: ...............test performed by......Marland KitchenBonnie A. [place], [place] (ASCP)

## 2010-10-03 NOTE — Progress Notes (Signed)
  Phone Note Outgoing Call Call back at [facility] Phone [phone]   Call placed by: Johney Maine MD,  February 18, 2009 1:34 PM Details for Reason: wet prep results Summary of Call: no infection current.  does not need metrogel or flagyl at this point.  will rx metrogel for future use but no need for pills.  patient did not answer but left messge on personal cell Initial call taken by: Johney Maine MD,  February 18, 2009 1:36 PM

## 2010-10-03 NOTE — Progress Notes (Signed)
Summary: triage  Phone Note Call from Patient Call back at Home Phone [phone]   Summary of Call: went to ed last night for headache believes it is bp related it was 159/92.  Her head is still hurting and she does not know what to do.  She has an app on [facility], but she don't think she can wait. Initial call taken by: Clydell Hakim,  April 08, 2009 4:18 PM  Follow-up for Phone Call        Reviewed ED records and patient was actually seen for abd pain and swelling.  Called patient back and she confirmed this but said that it was during the vsist that the elevated BP was discovered.  She then went on to tell me that she was very stressed d/t mom's recent death and having to care for her disabled father.   I explained that her HA was probably stressed related and best managed via an anti-imflammatory such as Tylenol or Motrin.  Instructed her to purchase Motrin and to a take one time dose of 800 mg followed by 400 mg 4 hours later.  Hopefully this will take the edge off the HA.  If her sx worsen she should go to the [facility] or ED.  Patient agreeable. Follow-up by: Dennison Nancy RN,  April 08, 2009 4:38 PM

## 2010-10-03 NOTE — Letter (Signed)
Summary: Results Follow-up Letter  [facility]  [address]   [place], [place] [address]   Phone: [phone]  Fax: [phone]    02/26/2009  [address] [place], [place]  [address]  Dear Ms. Boze,   The following are the results of your recent test(s):  Test     Result      Pap Smear    Normal   Please call for an appointment Or _________________________________________________________ _________________________________________________________ _________________________________________________________  Sincerely,  Johney Maine MD [facility]           Appended Document: Results Follow-up Letter mailed

## 2010-10-03 NOTE — Progress Notes (Signed)
Summary: pls call    I called and left message  Phone Note Call from Patient Call back at Home Phone [phone]   Caller: Patient Summary of Call: needs to talk to Tracy Weiss about her meds - getting ready to go back to school Initial call taken by: De Nurse,  April 14, 2010 1:43 PM  Follow-up for Phone Call        Called patient and asked her to leave a message with the front desk as to what she needs. I have a busy schedule this afternoon and most likelly will not be able to call back today.  Follow-up by: Jamie Brookes MD,  April 14, 2010 1:57 PM

## 2010-10-03 NOTE — Miscellaneous (Signed)
Summary: Request for documentation  pt dropped off request form for MD to supply documentation re: pts ADHD, placed on triage desk for any clinical info to be completed. Tracy Weiss  June 09, 2010 3:11 PM    form to Dr. Clotilde Dieter.Golden Circle RN  June 09, 2010 3:15 PM    done, will put on Tracy Weiss. Jamie Brookes MD  June 13, 2010 5:46 PM

## 2010-10-03 NOTE — Progress Notes (Signed)
Summary: triage  Phone Note Call from Patient Call back at Home Phone [phone]   Caller: Patient Summary of Call: tenderness on right side and lower back Initial call taken by: De Nurse,  August 04, 2009 2:34 PM  Follow-up for Phone Call        pain x 4 days. thinks it may be a bladder or kidney infection.  pcp schedule is full. placed with Dr. Janalyn Harder at 4:15 Follow-up by: Golden Circle RN,  August 04, 2009 3:28 PM  Additional Follow-up for Phone Call Additional follow up Details #1::        Put in as a work in on my schedule after 5 pm.  Additional Follow-up by: Jamie Brookes MD,  August 04, 2009 5:07 PM

## 2010-10-03 NOTE — Assessment & Plan Note (Signed)
Summary: blues, crying ,derpession/ls   Vital Signs:  Patient Profile:   45 Years Old Female Height:     61.75 inches Weight:      164.7 pounds BMI:     30.48 Temp:     98.3 degrees F Pulse rate:   81 / minute BP sitting:   130 / 87  (left arm)  Pt. in pain?   no  Vitals Entered By: Jacki Cones RN (July 02, 2007 9:19 AM)                  PCP:  Johney Maine MD  Chief Complaint:  f/u meds (lantus).  History of Present Illness: cc: elevated sugars, wt gain , crying 1)DM:  Tracy Weiss is a DM II pt who has struggled with elevated sugars for the past year. She finally agreed to Lantus a few months ago.  Previously we had tried oral meds only as pt feared that insulin would cause weight gain.  She is now upset b/c has gained 12lbs sent starting insulin.  Says she knows that she needs to go to gym more often.  Has tried treamill but thinks that weights are better. Has been reading about diabetes and is afraid of continuing to take meds b/c they may do her more harm than good.  Wants to increase her Metformin back.  Has no side effects of any meds.  Sugars are ranging from low of 262 high of 427, mostly in 300s since starting on Lantus.  No lows.  Pt does not want to start an ACE-I.  Wants a referral to Endocrinologist since we have been unable to adequately control her sugars.   2)Depression:  Pt has been crying alot lately. Says she has many  things going on in her life.  These include relationship questions, worsening diabetes, stress at work, financial stress, questions about her own life. Pt mentions that she may have been a "loner" so long that she did not make friends.  Says she has thought about what life would be like if she died but has no plans to hurt herself.  When asked what keeps her from hurting herself she just says "it's not realistic". Pt endorses highs and elevated moods as well.  She says she spends money she doesnt' have an has run up credit card bills. Has slept little  during some of these stretches.  Denies substance abuse at this time.  She is teary eyed during some of this discussion.  Really wants to talk to someone. States in past she has been on Wellbutrin and says she thinks she may need to be on it again.  Also wonders if her "highs" are b/c she is no longer taking Straterra.       Family History:    diabetes--mom, dad, grandparents    No history biplolar  Social History:    No smoking or alcohol.;  twin children   Risk Factors:  PAP Smear History:     Date of Last PAP Smear:  03/05/2007    Results:  normal  PAP Smear History:     Date of Last PAP Smear:  03/05/2007    Results:  normal    Review of Systems      See HPI   Physical Exam  General:     Well-developed,well-nourished,in no acute distress; alert,appropriate and cooperative throughout examination Psych:     normally interactive, good eye contact, and labile affect.      Impression & Recommendations:  Problem # 1:  DIABETES-TYPE 2 (ICD-250.00) Assessment: Improved Pt continues to have elevated sugars.  Will increase Lantus to 30 units at bedtime and likely may need more.  Continue Byetta as pt believes this has hellped her weight.  Increase Metforming ton 1.5 in am and 1 tab pm.  A1C is back and is improved although still high. Will discuss improvement with pt.  May refer to Endocrine if she desires or we can try the increased regimine.  Discussed concept of how pt's body requires insulin and exercise and wt loss will help decrease need but not eliminate it.  Pt is EXTREMELY motivated to exercise and lose weight.  Hopefullly we can work on stablizing weight while controlling sugars better.  Pt does not want to return to Pharm clinic.  Discussed importance of ACE-I for cardioprotection but she does not want anymore medications  The following medications were removed from the medication list:    Januvia 50 Mg Tabs (Sitagliptin phosphate) .Marland Kitchen... Take 1 tablet by mouth every  morning  Her updated medication list for this problem includes:    Bayer Childrens Aspirin 81 Mg Chew (Aspirin) .Marland Kitchen... Take 1 tablet by mouth once a day    Byetta 10 Mcg Pen 10 Mcg/0.30ml Soln (Exenatide) ..... Inject subcutaneously two times a day as directed    Metformin Hcl 1000 Mg Tabs (Metformin hcl) .Marland Kitchen... 1 1/2 tabs by mouth q am and 1 tab by mouth q pm    Lantus Solostar 100 Unit/ml Soln (Insulin glargine) ..... Inject 30 units subcutaneously once daily  Orders: Lipid-[facility] (11914-78295) A1C-[facility] (62130) [facility]- Est  Level 4 (86578)  Labs Reviewed: HgBA1c: 8.9 (07/02/2007)   Creat: 0.73 (04/10/2007)      Problem # 2:  DEPRESSION (ICD-311) Assessment: New Pt w signs of both depression and possible mania/bipolar.  I will not start SSRI b/c I fear she may have bipolar disorder or at least hypomania symptoms.  I reviewed her past visits with me and I have commented on pressured speech and hyperactivitiy. COuld be ADHD or possibly manic symptoms.   Today she is not having these symptoms.  Likely will  need some sort of mood stabilizer.  Offered pt referral to outside psychologist or psychiatrist as well as our own Psych D, Spero Geralds.  Pt wants to stay within [facility] so that I can communicate w/ provider.  Pt is stable enough to wait for OP appt.  I also will communicate with her over next few weeks.  Pt agrees to this plan. Orders: [facility]- Est  Level 4 (46962)   Complete Medication List: 1)  Bayer Childrens Aspirin 81 Mg Chew (Aspirin) .... Take 1 tablet by mouth once a day 2)  Byetta 10 Mcg Pen 10 Mcg/0.77ml Soln (Exenatide) .... Inject subcutaneously two times a day as directed 3)  Metrogel-vaginal 0.75 % Gel (Metronidazole) .Marland Kitchen.. 1 applicatorful into vagina twice a week 4)  Strattera 25 Mg Caps (Atomoxetine hcl) 5)  Metformin Hcl 1000 Mg Tabs (Metformin hcl) .Marland Kitchen.. 1 1/2 tabs by mouth q am and 1 tab by mouth q pm 6)  Lantus Solostar 100 Unit/ml Soln (Insulin glargine) .... Inject 30 units  subcutaneously once daily   Patient Instructions: 1)  Please schedule a follow-up appointment in 6 weeks 2)  Call Dr Spero Geralds 3)  Go to gym and do cardiac exercise or use treadmill 3-5 times a week for at least 30 minutes.  Try to get heartrate up.  Can also use weights.  4)  Increase Lantus to 30 units.  Increase Metforming  to 1 1/2 pills in am and 1 in evening.  continue Byetta.      ]  Laboratory Results   Blood Tests   Date/Time Received: July 02, 2007 9:53 AM  Date/Time Reported: July 02, 2007 10:12 AM   HGBA1C: 8.9%   (Normal Range: Non-Diabetic - 3-6%   Control Diabetic - 6-8%)  Comments: ...................................................................DONNA [facility]  July 02, 2007 10:12 AM      Preventive Care Screening  Pap Smear:    Date:  03/05/2007    Results:  normal    Appended Document: blues, crying ,derpession/ls Please note pt has also written down things about herself that is upsetting to her or that she does not like.  I believe she wrote it like a letter to herself.  York Spaniel this made her feel better.

## 2010-10-03 NOTE — Miscellaneous (Signed)
Summary: HCTZ and Losartan refill  Clinical Lists Changes  Medications: Rx of LOSARTAN POTASSIUM 50 MG TABS (LOSARTAN POTASSIUM) take one pill daily;  #34 x 6;  Signed;  Entered by: Jamie Brookes MD;  Authorized by: Jamie Brookes MD;  Method used: Electronically to [facility] [phone]*, [address], [place], [place]  [address], Ph: [phone], Fax: [phone] Rx of HYDROCHLOROTHIAZIDE 25 MG TABS (HYDROCHLOROTHIAZIDE) take 1 tablet daily;  #30 x 6;  Signed;  Entered by: Jamie Brookes MD;  Authorized by: Jamie Brookes MD;  Method used: Electronically to [facility] [id number]*, [address] [place], [place]  [address], Ph: [phone], Fax: [phone]    Prescriptions: HYDROCHLOROTHIAZIDE 25 MG TABS (HYDROCHLOROTHIAZIDE) take 1 tablet daily  #30 x 6   Entered and Authorized by:   Jamie Brookes MD   Signed by:   Jamie Brookes MD on 06/08/2010   Method used:   Electronically to        [facility] [phone]* (retail)       [address]       [place], [place]  [address]       Ph: [phone]       Fax: [phone]   RxID:   [id number] LOSARTAN POTASSIUM 50 MG TABS (LOSARTAN POTASSIUM) take one pill daily  #34 x 6   Entered and Authorized by:   Jamie Brookes MD   Signed by:   Jamie Brookes MD on 06/08/2010   Method used:   Electronically to        [facility] [phone]* (retail)       [address]       [place], [place]  [address]       Ph: [phone]       Fax: [phone]   RxID:   [id number]

## 2010-10-03 NOTE — Progress Notes (Signed)
  Phone Note Outgoing Call   Call placed by: Veleta Miners Call placed to: Patient Summary of Call: Called regarding pt's work-in request.  Pap smear showed yeast so I will call in Diflucan instead of having pt come in.  She has trouble getting off of work.  Also pt now has d/c similar to her BV flare-ups and had called in the Urgent Care line and was told she would need Flagyl.  I know this pt well and she does have recurrent BV.  I will go ahead and call this in. Will cancel work-in appt  Pt updated me on CBGs they are still high in 300s but improved from 400s on Januvia and Metformin.  She was unable to purchase her Byetta but will do so today.  Will follow CBGs and call in 1 week.  if no lows, can go up on Januvia.  If lows, d/c Byetta.  If no improvement, will go to insulin.   Johney Maine, MD

## 2010-10-03 NOTE — Miscellaneous (Signed)
Summary: Losartan started  Clinical Lists Changes  Medications: Changed medication from LISINOPRIL 2.5 MG  TABS (LISINOPRIL) 1 tab by mouth daily to LOSARTAN POTASSIUM 50 MG TABS (LOSARTAN POTASSIUM) take one pill daily - Signed Rx of LOSARTAN POTASSIUM 50 MG TABS (LOSARTAN POTASSIUM) take one pill daily;  #30 x 0;  Signed;  Entered by: Jamie Brookes MD;  Authorized by: Jamie Brookes MD;  Method used: [facility] [address] [id number]*, [address], [place], [place], [place]  [address], Ph: [phone] or [phone], Fax: [phone]    Prescriptions: LOSARTAN POTASSIUM 50 MG TABS (LOSARTAN POTASSIUM) take one pill daily  #30 x 0   Entered and Authorized by:   Jamie Brookes MD   Signed by:   Jamie Brookes MD on 05/04/2009   Method used:   [facility]        [facility] [address] [phone]* (retail)       [address]       [place], [place]  [address]       Ph: [phone] or [phone]       Fax: [phone]   RxID:   [id number]

## 2010-10-03 NOTE — Progress Notes (Signed)
Summary: Wants to speak with Dr  Medications Added BAYER CHILDRENS ASPIRIN 81 MG CHEW (ASPIRIN) Take 1 tablet by mouth once a day BYETTA 5 MCG PEN 5 MCG/0.02ML SOLN (EXENATIDE) Inject 5 mcg subcutaneously twice a day JANUVIA 50 MG TABS (SITAGLIPTIN PHOSPHATE) Take 1 tablet by mouth every morning METROGEL-VAGINAL 0.75 % GEL (METRONIDAZOLE) 1 applicatorful into vagina twice a week STRATTERA 25 MG  CAPS (ATOMOXETINE HCL)        Phone Note Call from Patient Call back at Home Phone [phone]   Summary of Call: wants to speak with Dr [name] - states dr already knows what she is calling about Initial call taken by: [name],  March 19, 2007 8:34 AM  Follow-up for Phone Call        called pt/informed of pap smear WNL. pt reports that her blood sugars are in the high 200's every morning. this morning 234.  would like to start Insulin. has an appt with dr.[name] on 04-06-07 and would like for dr.[name] to call her KZ:SWFUXNAT insulin fwd. message to dr.[name] Follow-up by: [name],,  March 19, 2007 8:54 AM    Additional Follow-up for Phone Call Additional follow up Details #2::    Left message 1000 7/16. [name] Pt called back:  Sugars in 200s and thinks she may need insulin. Asks about new insulin Levimer(sp?) and use of Byetta to offset weight gain.  Will discuss this w/ Dr [name].  In the interim prior to appt in 8/3 will go up on Januvia to therapeutic dose of 100mg  daily and follow CBGS.  I am encuraged that CBGs are in 200s and not 400s but still too high.  Will monitor for lows.  Continue Byetta and Metformin.  Pt has understanding of meds.    New/Updated Medications: BAYER CHILDRENS ASPIRIN 81 MG CHEW (ASPIRIN) Take 1 tablet by mouth once a day BYETTA 5 MCG PEN 5 MCG/0.02ML SOLN (EXENATIDE) Inject 5 mcg subcutaneously twice a day JANUVIA 50 MG TABS (SITAGLIPTIN PHOSPHATE) Take 1 tablet by mouth every morning METROGEL-VAGINAL 0.75 % GEL (METRONIDAZOLE) 1 applicatorful  into vagina twice a week STRATTERA 25 MG  CAPS (ATOMOXETINE HCL)

## 2010-10-03 NOTE — Assessment & Plan Note (Signed)
Summary: PPD TEST/  DPG  Nurse Visit    Prior Medications: BAYER CHILDRENS ASPIRIN 81 MG CHEW (ASPIRIN) Take 1 tablet by mouth once a day BYETTA 10 MCG PEN 10 MCG/0.04ML  SOLN (EXENATIDE) inject subcutaneously two times a day as directed METROGEL-VAGINAL 0.75 % GEL (METRONIDAZOLE) 1 applicatorful into vagina twice a week METFORMIN HCL 1000 MG TABS (METFORMIN HCL) 1 1/2 tabs by mouth q am and 1 tab by mouth q pm LANTUS SOLOSTAR 100 UNIT/ML  SOLN (INSULIN GLARGINE) inject 50 units subcutaneously once daily STRATTERA 18 MG  CAPS (ATOMOXETINE HCL) 1 tab by mouth daily LISINOPRIL 2.5 MG  TABS (LISINOPRIL) 1 tab by mouth daily DIFLUCAN 150 MG  TABS (FLUCONAZOLE) 1 tab by mouth x one. Repeat next day if symptoms continue    Hepatitis B Vaccine # 1    Vaccine Type: HepB Adult    Site: left deltoid    Mfr: GlaxoSmithKline    Dose: 1.0 ml    Route: IM    Given by: Anyssa Sharpless LPN    Exp. Date: 02/25/2010    Lot #: [id number]    VIS given: 03/20/06 version given February 13, 2008.  PPD Application    Vaccine Type: PPD    Site: right forearm    Mfr: Sanofi Pasteur    Dose: 0.1 ml    Route: ID    Given by: Asim Gersten LPN    Exp. Date: 07/10/2010    Lot #: [id number]   Orders Added: 1)  Hepatitis B Vaccine >80yrs [90746] 2)  TB Skin Test [86580] 3)  Admin 1st Vaccine [90471] 4)  Est Level 1- [facility] [47829]    ]

## 2010-10-03 NOTE — Assessment & Plan Note (Signed)
Summary: Depression, DM, Obesity   Vital Signs:  Patient profile:   45 year old female Weight:      178.3 pounds BMI:     32.99 Pulse rate:   92 / minute BP sitting:   131 / 86  (left arm)  Vitals Entered By: Starleen Blue RN 04/13/2009  Nutrition Counseling: Patient's BMI is greater than 25 and therefore counseled on weight management options. CC: BP,Weight loss, Depression, DM Is Patient Diabetic? Yes  Pain Assessment Patient in pain? no        Primary Care Provider:  Jamie Brookes MD  CC:  BP, Weight loss, Depression, and DM.  History of Present Illness: Tracy Weiss is an articulate 45 y/o female who has been going through a lot of family and job stress. Her mother recently died and she is now taking care of her elderly father. She has gone to the ER recently with abdominal pain but was also found to have an elevated SBP (159) and contributes her headaches to the elevated BP.  BP: Pt is concerned that she may be developing hypertension. Her BP was elevated when in the ER with pain. The elevation in the BP was likely due to the pain she was feeling. Her BP on the day of this appointment was 131/86. Would not start meds for this one time elevation in BP.  Weight loss: Pt hs a BMI of 32. She is concerned about her weight. Discussed diet and exercise. Pt given form to fill out about diet history and given an exercise prescription.    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 Will start an antidepressant that will work to help with weight loss and focus all at the same time.   DM: Pt is doing well with her insulin injections. She wants to lose weight. Discussed that her blood sugars will come down if she exercises and gets her glucose down.     Allergies: No Known Drug Allergies  Past History:  Past Medical History: Last updated: 11/24/2007 frequent episodes of bv and  yeast hx condyloma accuminata hx endometriosis hx SAB  thrombosed hemorrhoid 3/04 DEPRESSION (ICD-311) RHINITIS, ALLERGIC (ICD-477.9) IRRITABLE BOWEL SYNDROME (ICD-564.1) ATTENTION DEFICIT, W/O HYPERACTIVITY (ICD-314.00) ANXIETY (ICD-300.00) DIABETES-TYPE 2 (ICD-250.00)    Family History: Last updated: 02/18/2009 diabetes--mom, dad, grandparents No history biplolar mom w/ breast cancer  Risk Factors: Exercise: yes (02/18/2009)  Review of Systems       see HPi  Physical Exam  General:  obese, in no acute distress; alert,appropriate and cooperative throughout examination Head:  Normocephalic and atraumatic without obvious abnormalities. No apparent alopecia or balding. Lungs:  Normal respiratory effort, chest expands symmetrically. Lungs are clear to auscultation, no crackles or wheezes. Heart:  Normal rate and regular rhythm. S1 and S2 normal without gallop, murmur, click, rub or other extra sounds.  Diabetes Management Exam:    Foot Exam (with socks and/or shoes not present):       Sensory-Pinprick/Light touch:          Left medial foot (L-4): normal          Left dorsal foot (L-5): normal          Left lateral foot (S-1): normal          Right medial foot (L-4): normal          Right dorsal foot (L-5): normal  Right lateral foot (S-1): normal       Sensory-Monofilament:          Left foot: normal          Right foot: normal       Inspection:          Left foot: normal          Right foot: normal       Nails:          Left foot: normal          Right foot: normal    Impression & Recommendations:  Problem # 1:  DEPRESSION (ICD-311) Assessment Deteriorated Pt is dealing with a lot of changes in life. After dicussing different antidepressants she decided she would like to try Welbutrin since it may help with weight loss, her attention deficit and depression. Will plan to reassess in 6 months.   Her updated medication list for this problem includes:     Wellbutrin Xl 150 Mg Xr24h-tab (Bupropion hcl) .Marland Kitchen... Take one tab in the morning daily    Budeprion Xl 150 Mg Xr24h-tab (Bupropion hcl) .Marland Kitchen... Take one pill every morning  Orders: [facility]- Est  Level 4 ([address]214)  Problem # 2:  DIABETES-TYPE 2 (ICD-250.00) Assessment: Unchanged PT is doing well with her medications. She has not concerns at this time. She had a random glucose on 04-07-09 in the ER of 124. BMET was not done. Will need to check kidney function. Will order BMET for future.    Her updated medication list for this problem includes:    Bayer Childrens Aspirin 81 Mg Chew (Aspirin) .Marland Kitchen... Take 1 tablet by mouth once a day    Metformin Hcl 1000 Mg Tabs (Metformin hcl) .Marland Kitchen... 1 tab by mouth two times a day    Lisinopril 2.5 Mg Tabs (Lisinopril) .Marland Kitchen... 1 tab by mouth daily    Lantus 100 Unit/ml Soln (Insulin glargine) .Marland KitchenMarland KitchenMarland KitchenMarland Kitchen 80 u subcutaneously two times a day    Lantus Solostar 100 Unit/ml Soln (Insulin glargine) .Marland KitchenMarland KitchenMarland KitchenMarland Kitchen 80 units subcutaneously two times a day or as directed  Orders: [facility]- Est  Level 4 ([address]214)Future Orders: A1C-[facility] (04540) ... 04/12/2010 Basic Met-[facility] ([id number]) ... 04/07/2010  Problem # 3:  OBESITY (ICD-278.00) Assessment: Deteriorated Pt feels as if she is gaining weight and wants to discuss weight loss. Spent > 25 min discussing weight loss stratigies and exercise. Gave pt diet history to complete and and exercise prescription to start exercising and increase slowly. Hopefully pt will lose a bit of weight being on Welbutrin as well. Will continue to monitor.   Orders: [facility]- Est  Level 4 ([address]214)Future Orders: Lipid-[facility] ([id number]) ... 04/26/2010  Complete Medication List: 1)  Bayer Childrens Aspirin 81 Mg Chew (Aspirin) .... Take 1 tablet by mouth once a day 2)  Metformin Hcl 1000 Mg Tabs (Metformin hcl) .Marland Kitchen.. 1 tab by mouth two times a day 3)  Lisinopril 2.5 Mg Tabs (Lisinopril) .Marland Kitchen.. 1 tab by mouth daily 4)  Lantus 100 Unit/ml Soln (Insulin glargine) .... 80 u  subcutaneously two times a day 5)  Insulin Syringes  .... Use once daily 6)  [facility] Blood Glucose Test Strp (Glucose blood) .... Use as directed 7)  [facility] Ultra Thin Lancets Misc (Lancets) .... Use as directed 8)  Lantus Solostar 100 Unit/ml Soln (Insulin glargine) .... 80 units subcutaneously two times a day or as directed 9)  Metronidazole 0.75 % Gel (Metronidazole) .Marland Kitchen.. 1 applicatorful per vagina x 5 days 10)  Wellbutrin Xl  150 Mg Xr24h-tab (Bupropion hcl) .... Take one tab in the morning daily 11)  Budeprion Xl 150 Mg Xr24h-tab (Bupropion hcl) .... Take one pill every morning  Patient Instructions: 1)  Come back in 5-6  weeks to get:  2)  Fasting lipid panal, HgA1c and your weight checked.  3)  Record your diet history (1 weekend and 2 week days).  4)  Please schedule a follow-up appointment in 6 months .  Prescriptions: LISINOPRIL 2.5 MG  TABS (LISINOPRIL) 1 tab by mouth daily  #34 x 6   Entered and Authorized by:   Jamie Brookes MD   Signed by:   Jamie Brookes MD on 04/13/2009   Method used:   Electronically to        [facility] [phone]* (retail)       [address]       [place], [place]  [address]       Ph: [phone]       Fax: [phone]   RxID:   [id number] BUDEPRION XL 150 MG XR24H-TAB (BUPROPION HCL) take one pill every morning  #31 x 3   Entered and Authorized by:   Jamie Brookes MD   Signed by:   Jamie Brookes MD on 04/13/2009   Method used:   Electronically to        [facility] [phone]* (retail)       [address]       [place], [place]  [address]       Ph: [phone]       Fax: [phone]   RxID:   [id number]

## 2010-10-03 NOTE — Consult Note (Signed)
Summary: Deabetic Eye Exam  NO retinopathy  Deabetic Eye Exam   Imported By: Clydell Hakim 06/09/2009 13:48:39  _____________________________________________________________________  External Attachment:    Type:   Image     Comment:   External Document

## 2010-10-03 NOTE — Miscellaneous (Signed)
Summary: Metformin refill  Clinical Lists Changes  Medications: Rx of METFORMIN HCL 1000 MG TABS (METFORMIN HCL) 1 tab by mouth two times a day;  #62 x 6;  Signed;  Entered by: Jamie Brookes MD;  Authorized by: Jamie Brookes MD;  Method used: Electronically to [facility] [phone]*, [address], [place], [place]  [address], Ph: [phone], Fax: [phone]    Prescriptions: METFORMIN HCL 1000 MG TABS (METFORMIN HCL) 1 tab by mouth two times a day  #62 x 6   Entered and Authorized by:   Jamie Brookes MD   Signed by:   Jamie Brookes MD on 09/19/2009   Method used:   Electronically to        [facility] [phone]* (retail)       [address]       [place], [place]  [address]       Ph: [phone]       Fax: [phone]   RxID:   [id number]

## 2010-10-03 NOTE — Assessment & Plan Note (Signed)
Summary: ADD, HTN, Obesity/DM2   Vital Signs:  Patient profile:   45 year old female Weight:      179 pounds Temp:     98.5 degrees F oral Pulse rate:   72 / minute Pulse rhythm:   regular BP sitting:   123 / 81  (left arm) Cuff size:   large  Vitals Entered By: Loralee Pacas CMA (July 04, 2010 2:22 PM)  Primary Care Provider:  Jamie Brookes MD  CC:  DM2, ADD, and HTN.  History of Present Illness: ADD: Pt says that she has not been able to get the Adderall lately because the manufacturer is not making it. She has called around to several pharmacies and no one has it. She wants to know if there is a different medicine we can use. Plan to switch to Methylphenidate. She would like to go back on Straterra someday but since it is not generic she can not afford it.   hypertension: Pt needs a refill on HCTZ. Her BP is well controlled at 123/81 today. No side effects of concern.   Obesity/DM2:  Pt is out of Lanus and is requesting a Lanus pen sample today. She has not been taking her insulin as she should because of running out. She has an elevated A1c today.  Pt has gained 6 lbs since her last appointment. She is getting discouraged.   Current Medications (verified): 1)  Bayer Childrens Aspirin 81 Mg Chew (Aspirin) .... Take 1 Tablet By Mouth Once A Day 2)  Metformin Hcl 1000 Mg Tabs (Metformin Hcl) .Marland Kitchen.. 1 Tab By Mouth Two Times A Day 3)  Losartan Potassium 50 Mg Tabs (Losartan Potassium) .... Take One Pill Daily 4)  Lantus 100 Unit/ml Soln (Insulin Glargine) .... 80 U Subcutaneously Two Times A Day 5)  Insulin Syringes .... Use Once Daily 6)  Relion Blood Glucose Test  Strp (Glucose Blood) .... Use As Directed 7)  Relion Ultra Thin Lancets  Misc (Lancets) .... Use As Directed 8)  Lantus Solostar 100 Unit/ml Soln (Insulin Glargine) .... 80 Units Subcutaneously Two Times A Day or As Directed 9)  Metronidazole 0.75 % Gel (Metronidazole) .Marland Kitchen.. 1 Applicatorful Per Vagina X 5 Days 10)   Bupropion Sr 150 Mg .... Take Two Tablets Daily. 11)  Hydrochlorothiazide 25 Mg Tabs (Hydrochlorothiazide) .... Take 1 Tablet Daily 12)  Methylphenidate Hcl 10 Mg Tabs (Methylphenidate Hcl) .... Take 1 Tab in The Morning and 1 in The Evening. 13)  Fluconazole 150 Mg Tabs (Fluconazole) .... Take 1 Pill At The First Symptoms of Vaginal Infection. Take Another One in 4 Days If No Improvement. 14)  Novolog 100 Unit/ml Soln (Insulin Aspart) .Marland Kitchen.. 10 Units At Each Meal  Allergies (verified): No Known Drug Allergies  Review of Systems        vitals reviewed and pertinent negatives and positives seen in HPI   Physical Exam  General:  Well-developed,well-nourished,in no acute distress; alert,appropriate and cooperative throughout examination Lungs:  Normal respiratory effort, chest expands symmetrically. Lungs are clear to auscultation, no crackles or wheezes. Heart:  Normal rate and regular rhythm. S1 and S2 normal without gallop, murmur, click, rub or other extra sounds. Abdomen:  abdomen soft and non-tender without masses, organomegaly or hernias noted. Genitalia:  Normal introitus for age, no external lesions, no vaginal discharge, mucosa pink and moist, no vaginal or cervical lesions, no vaginal atrophy, no friaility or hemorrhage, normal uterus size and position, no adnexal masses or tenderness Psych:  Cognition and  judgment appear intact. Alert and cooperative with normal attention span and concentration. No apparent delusions, illusions, hallucinations   Impression & Recommendations:  Problem # 1:  ATTENTION DEFICIT, W/O HYPERACTIVITY (ICD-314.00) Assessment Deteriorated  Pt has been without her meds so she is not doing so well. She wants to switch to a different med since Adderall is not available right now. Will switch to methylphenidate. (see med list) Orders: [facility]- Est Level  3 (52841)  Problem # 2:  ESSENTIAL HYPERTENSION, BENIGN (ICD-401.1) Assessment: Unchanged Pt's BP is well  controlled. Will cont meds as prescribed. Will refill HCTZ.  Her updated medication list for this problem includes:    Losartan Potassium 50 Mg Tabs (Losartan potassium) .Marland Kitchen... Take one pill daily    Hydrochlorothiazide 25 Mg Tabs (Hydrochlorothiazide) .Marland Kitchen... Take 1 tablet daily  Orders: [facility]- Est Level  3 (32440)  Problem # 3:  OBESITY (ICD-278.00) Assessment: Deteriorated Pt has gained 6 lbs and her A1c has gone up. I suspect that she is gaining some weight because of not having her appetite suppressed by Adderall and simply eating more. Her A1c is up because she didn't have the lantus she needs. Pt given Lantus pen in office, started back on ADD meds.   Orders: [facility]- Est Level  3 (10272)  Complete Medication List: 1)  Bayer Childrens Aspirin 81 Mg Chew (Aspirin) .... Take 1 tablet by mouth once a day 2)  Metformin Hcl 1000 Mg Tabs (Metformin hcl) .Marland Kitchen.. 1 tab by mouth two times a day 3)  Losartan Potassium 50 Mg Tabs (Losartan potassium) .... Take one pill daily 4)  Lantus 100 Unit/ml Soln (Insulin glargine) .... 80 u subcutaneously two times a day 5)  Insulin Syringes  .... Use once daily 6)  Relion Blood Glucose Test Strp (Glucose blood) .... Use as directed 7)  Relion Ultra Thin Lancets Misc (Lancets) .... Use as directed 8)  Lantus Solostar 100 Unit/ml Soln (Insulin glargine) .... 80 units subcutaneously two times a day or as directed 9)  Metronidazole 0.75 % Gel (Metronidazole) .Marland Kitchen.. 1 applicatorful per vagina x 5 days 10)  Bupropion Sr 150 Mg  .... Take two tablets daily. 11)  Hydrochlorothiazide 25 Mg Tabs (Hydrochlorothiazide) .... Take 1 tablet daily 12)  Methylphenidate Hcl 10 Mg Tabs (Methylphenidate hcl) .... Take 1 tab in the morning and 1 in the evening. 13)  Fluconazole 150 Mg Tabs (Fluconazole) .... Take 1 pill at the first symptoms of vaginal infection. take another one in 4 days if no improvement. 14)  Novolog 100 Unit/ml Soln (Insulin aspart) .Marland Kitchen.. 10 units at each  meal  Other Orders: A1C-[facility] (53664) UA Glucose/Protein-[facility] (40347)  Patient Instructions: 1)  It was good to see you today.  2)  You are getting an A1c. We will call you with results.  3)  Call Jaynee Eagles to make an appointment for a mammogram.  Prescriptions: HYDROCHLOROTHIAZIDE 25 MG TABS (HYDROCHLOROTHIAZIDE) take 1 tablet daily  #30 x 5   Entered and Authorized by:   Jamie Brookes MD   Signed by:   Jamie Brookes MD on 07/04/2010   Method used:   Electronically to        [facility] [phone]* (retail)       [address]       [place], [place]  [address]       Ph: [phone]       Fax: [phone]   RxID:   [id number] METHYLPHENIDATE HCL 10 MG TABS (METHYLPHENIDATE HCL) take  1 tab in the morning and 1 in the evening.  #60 x 0   Entered and Authorized by:   Jamie Brookes MD   Signed by:   Jamie Brookes MD on 07/04/2010   Method used:   Handwritten   RxID:   [id number]    Orders Added: 1)  [facility]- Est Level  3 [14782] 2)  A1C-[facility] [83036] 3)  UA Glucose/Protein-[facility] [81002]    Laboratory Results   Urine Tests  Date/Time Received: July 04, 2010 3:12 PM  Date/Time Reported: July 04, 2010 3:34 PM   Routine Urinalysis   Glucose: 100   (Normal Range: Negative) Protein: negative   (Normal Range: Negative)    Comments: ...............test performed by......Marland KitchenBonnie A. [place], MLS (ASCP)cm   Blood Tests   Date/Time Received: July 04, 2010 3:12 PM  Date/Time Reported: July 04, 2010 3:25 PM   HGBA1C: 9.5%   (Normal Range: Non-Diabetic - 3-6%   Control Diabetic - 6-8%)  Comments: ...............test performed by......Marland KitchenBonnie A. [place], MLS (ASCP)cm

## 2010-10-03 NOTE — Assessment & Plan Note (Signed)
Summary: FU/KH needs to see denya re copays   Vital Signs:  Patient Profile:   45 Years Old Female Height:     61.75 inches Weight:      152 pounds Pulse rate:   82 / minute BP sitting:   121 / 84  Vitals Entered By: Lillia Pauls CMA (April 10, 2007 9:40 AM)               PCP:  Johney Maine MD  Chief Complaint:  fu dm and vag d/c.  History of Present Illness: cc: dm HPI: 45 yo female w/ uncontrolled DM 1)DM:  Ms Brandenburger has continued to have CBGs in 200s.  This is slightly lower since we have started Januvia but not at the goal we wanted. She states she is ready to start insulin.  Has used Lantus in past before.  Wants to stay on Byetta b/c of weight loss she has achieved.  (likelly from high sugars in reality).  Continues to have blurry vision when sugars are up.  Also on max Metformin, Januvia. 2)Vaginal d/c:  Has devloped white vag d/c. Has frequent yeast infections b/c of high CBGs    Past Medical History:    Reviewed history from 01/31/2007 and no changes required:       frequent episodes of bv and yeast       hx condyloma accuminata       hx endometriosis       hx SAB        thrombosed hemorrhoid 3/04   Family History:    Reviewed history from 10/31/2006 and no changes required:       diabetes--mom, dad, grandparents  Social History:    Reviewed history from 10/31/2006 and no changes required:       No smoking or alcohol.; Lives with husband, twin children and niece.    Review of Systems      See HPI   Physical Exam  General:     Well-developed,well-nourished,in no acute distress; alert,appropriate and cooperative throughout examination    Impression & Recommendations:  Problem # 1:  DIABETES-TYPE 2 (ICD-250.00) Assessment: Deteriorated Last A1C 11 with continued high CBGs.  Have discussed case with Dr [name], PharmD.  Will start Lantus at 15units once daily. continue Byetta.  D/c Januvia.  Decrease Metromin to 1000mg  two times a day.  Pt to check  sugars and monitor for lows.  Will call me w/ sugars so we can adjust Lantus.  Likely will require up to 30 units based on weight.  Pt insists on keeping Byetta as she feels it will help balance wt loss.  f/u 1 month.  Discussed starting ACE-I low dose but pt wants to weight until next visit.  Discussed beneftis of cardioprotection.  Will take ASA. Her updated medication list for this problem includes:    Bayer Childrens Aspirin 81 Mg Chew (Aspirin) .Marland Kitchen... Take 1 tablet by mouth once a day    Byetta 10 Mcg Pen 10 Mcg/0.67ml Soln (Exenatide) ..... Inject subcutaneously two times a day as directed    Januvia 50 Mg Tabs (Sitagliptin phosphate) .Marland Kitchen... Take 1 tablet by mouth every morning    Metformin Hcl 1000 Mg Tabs (Metformin hcl) .Marland Kitchen... 1 1/2 tabs by mouth q am and 1 tab by mouth q pm    Lantus Solostar 100 Unit/ml Soln (Insulin glargine) ..... Inject 15 units subcutaneously once daily  Orders: Comp Met-[facility] (52841-32440) Lipid-[facility] (10272-53664) [facility]- Est  Level 4 (40347)  Labs Reviewed:  HgBA1c: 11.0 (03/05/2007)      Problem # 2:  VAGINAL DISCHARGE (ICD-623.5) Assessment: Deteriorated Wrote for diflucan 2 pills.  Hopefully will decrease once sugars controlled. Her updated medication list for this problem includes:    Metrogel-vaginal 0.75 % Gel (Metronidazole) .Marland Kitchen... 1 applicatorful into vagina twice a week  Orders: Wet Prep- [facility] (87210) [facility]- Est  Level 4 (55732)   Complete Medication List: 1)  Bayer Childrens Aspirin 81 Mg Chew (Aspirin) .... Take 1 tablet by mouth once a day 2)  Byetta 10 Mcg Pen 10 Mcg/0.5ml Soln (Exenatide) .... Inject subcutaneously two times a day as directed 3)  Januvia 50 Mg Tabs (Sitagliptin phosphate) .... Take 1 tablet by mouth every morning 4)  Metrogel-vaginal 0.75 % Gel (Metronidazole) .Marland Kitchen.. 1 applicatorful into vagina twice a week 5)  Strattera 25 Mg Caps (Atomoxetine hcl) 6)  Metformin Hcl 1000 Mg Tabs (Metformin hcl) .Marland Kitchen.. 1 1/2 tabs by mouth q am and 1  tab by mouth q pm 7)  Lantus Solostar 100 Unit/ml Soln (Insulin glargine) .... Inject 15 units subcutaneously once daily     Prescriptions: DIFLUCAN 150 MG TABS (FLUCONAZOLE) 1 tab by mouth daily  #2 x 0   Entered and Authorized by:   Johney Maine MD   Signed by:   Johney Maine MD on 04/10/2007   Method used:   Print then Give to Patient   RxID:   [id number] LANTUS SOLOSTAR 100 UNIT/ML  SOLN (INSULIN GLARGINE) inject 15 units subcutaneously once daily  #1 x 3   Entered and Authorized by:   Johney Maine MD   Signed by:   Johney Maine MD on 04/10/2007   Method used:   Print then Give to Patient   RxID:   [id number] BYETTA 10 MCG PEN 10 MCG/0.04ML  SOLN (EXENATIDE) inject subcutaneously two times a day as directed  #1 x 3   Entered and Authorized by:   Johney Maine MD   Signed by:   Johney Maine MD on 04/10/2007   Method used:   Print then Give to Patient   RxID:   [id number]      Laboratory Results  Date/Time Received: April 10, 2007 9:52 AM  Date/Time Reported: April 10, 2007 10:14 AM   Wet [place]/KOH Source: Vaginal WBC/hpf 1-5 Bacteria/hpf 3+  Rods Clue cells/hpf none  Negative whiff Yeast/hpf none Trichomonas/hpf none Comments ...................................................................DONNA LORING  April 10, 2007 10:14 AM

## 2010-10-03 NOTE — Progress Notes (Signed)
Summary: WI request  Phone Note Call from Patient Call back at Home Phone [phone]   Summary of Call: pt is needing to be seen next week for her blood sugar - states it was 501 yesterday and went to UC for it last night and is supposed to fu early next week Initial call taken by: [name],  December 13, 2006 9:07 AM  Follow-up for Phone Call        was seen in urgent care yesterday . no meds were changed . advised to f/u with pcp .  needs endocrinologist referral. offered appointment this afternoon but pt is at work. requests appointment tues 12/17/06 and it is scheduled . advised to follow diet carefully and take meds as directed. Follow-up by: Theresia Lo RN,  December 13, 2006 11:03 AM

## 2010-10-03 NOTE — Assessment & Plan Note (Signed)
Summary: ck dm and stomach swelling, df   Vital Signs:  Patient Profile:   45 Years Old Female Height:     61.75 inches Weight:      168 pounds Pulse rate:   87 / minute BP sitting:   128 / 93  Pt. in pain?   no  Vitals Entered By: Jone Baseman CMA (September 10, 2008 9:49 AM)                   PCP:  Johney Maine MD  Chief Complaint:  DM and stomach swelling.  History of Present Illness: Tracy Weiss is a 45 yo AAF with h/o poorly controlled  DM, recurrent BV, depression who presents with 3 month h/o stomach swelling and weight gain.  1.  Stomach swelling/weight gain- has actually lost four pounds since last visit.  Has a h/o IBS but states this feels different tha her IBS flares.  She is frequently nauseated and vommits a couple of times a month with "all over" abdominal pain.  Feels like her bowels are irregular as well, alternating between constipation and diarrhea.  2. DM- has been poorly controlled for a while.  Lost insurance coverage last year and can no longer afford Byetta.  Now on Lantus 50 qam and 50 q pm.  Does not check her sugars at home.  Denies any symptoms of hypo or hyper glycemia.  She does endorse some blurry vision but has not been to an eye doctor in years.  Wants Lanuts samples today.  Medications reviewed.    Past Medical History:    Reviewed history from 11/24/2007 and no changes required:       frequent episodes of bv and yeast       hx condyloma accuminata       hx endometriosis       hx SAB        thrombosed hemorrhoid 3/04       DEPRESSION (ICD-311)       RHINITIS, ALLERGIC (ICD-477.9)       IRRITABLE BOWEL SYNDROME (ICD-564.1)       ATTENTION DEFICIT, W/O HYPERACTIVITY (ICD-314.00)       ANXIETY (ICD-300.00)       DIABETES-TYPE 2 (ICD-250.00)          Past Surgical History:    Reviewed history from 01/31/2007 and no changes required:       btl - 06/03/1989       c-section - 10/04/1984       l. groin mass excision (endometriosis) -  07/04/1990     Review of Systems      See HPI   Physical Exam  General:     alert and well-nourished.   Lungs:     Normal respiratory effort, chest expands symmetrically. Lungs are clear to auscultation, no crackles or wheezes. Heart:     Normal rate and regular rhythm. S1 and S2 normal without gallop, murmur, click, rub or other extra sounds. Abdomen:     Bowel sounds positive,abdomen soft and non-tender without masses, organomegaly or hernias noted.    Impression & Recommendations:  Problem # 1:  ABDOMINAL PAIN, GENERALIZED (ICD-789.07) Assessment: New Possibly early onset gastroparesis given poorly controlled diabetes.  Will check LFTs simply to r/o other pathology although not likely.  Advised that if labs wnl and continues to have issues could possibly try Reglan. Orders: Hepatic-[facility] ([id number]) [facility]- Est Level  3 (09811)   Problem # 2:  DIABETES-TYPE 2 (ICD-250.00) Assessment:  Unchanged Poorly controlled.  Strongly advised to see an eye doctor.  Attempted to get Lanuts samples from her but Olegario Messier says would could not give it to her.  She became VERY angry but nursing staff was able to calm her down. Her updated medication list for this problem includes:    Bayer Childrens Aspirin 81 Mg Chew (Aspirin) .Marland Kitchen... Take 1 tablet by mouth once a day    Metformin Hcl 1000 Mg Tabs (Metformin hcl) .Marland Kitchen... 1 tab by mouth two times a day    Lantus 100 Unit/ml Soln (Insulin glargine) .Marland KitchenMarland KitchenMarland KitchenMarland Kitchen 50 two times a day    Lisinopril 2.5 Mg Tabs (Lisinopril) .Marland Kitchen... 1 tab by mouth daily    Lantus 100 Unit/ml Soln (Insulin glargine) ..... Inject 50 units once daily subq  Orders: A1C-[facility] (54098) UA Microalbumin-[facility] (11914) [facility]- Est Level  3 (78295)   Complete Medication List: 1)  Bayer Childrens Aspirin 81 Mg Chew (Aspirin) .... Take 1 tablet by mouth once a day 2)  Metformin Hcl 1000 Mg Tabs (Metformin hcl) .Marland Kitchen.. 1 tab by mouth two times a day 3)  Lantus 100 Unit/ml Soln (Insulin glargine) ....  50 two times a day 4)  Strattera 18 Mg Caps (Atomoxetine hcl) .Marland Kitchen.. 1 tab by mouth daily 5)  Lisinopril 2.5 Mg Tabs (Lisinopril) .Marland Kitchen.. 1 tab by mouth daily 6)  Lantus 100 Unit/ml Soln (Insulin glargine) .... Inject 50 units once daily subq 7)  Insulin Syringes  .... Use once daily 8)  Relion Blood Glucose Test Strp (Glucose blood) .... Use as directed 9)  Relion Ultra Thin Lancets Misc (Lancets) .... Use as directed 10)  Metronidazole 500 Mg Tabs (Metronidazole) .Marland Kitchen.. 1 tab by mouth two times a day for 7 days    ] Laboratory Results   Blood Tests   Date/Time Received: September 10, 2008 9:46 AM  Date/Time Reported: September 10, 2008 9:57 AM   HGBA1C: 9.2%   (Normal Range: Non-Diabetic - 3-6%   Control Diabetic - 6-8%)  Comments: ...............test performed by......Marland KitchenBonnie A. [place], [place] (ASCP)      Appended Document: microalbumin results  Laboratory Results   Urine Tests  Date/Time Received: September 10, 2008 10:45 AM  Date/Time Reported: September 10, 2008 11:00 AM   Microalbumin (urine): 1+ mg/L   Comments: ...........test performed by...........Marland KitchenTerese Door, CMA

## 2010-10-03 NOTE — Progress Notes (Signed)
Summary: Schedule Behavioral Medicine  Phone Note Call from Patient   Caller: Patient Call For: Spero Geralds Summary of Call: Patient saw Dr. [name] today and was asked to call for an appointment.  She called before she left the building.  I couldn't get a sense from her whether this was a [facility] or Behavioral Medicine referral.  I scheduled her for Behavioral Medicine because I had an opening on 07/14/07 at 10:00.  I can initiate an evaluation and then go from there. Initial call taken by: Spero Geralds PsyD,  July 02, 2007 9:56 AM

## 2010-10-03 NOTE — Progress Notes (Signed)
Summary: triage  Phone Note Call from Patient Call back at Home Phone [phone]   Caller: Patient Summary of Call: pt is urinating frequently w/ burn -  mom passed away last February 04, 2023 and funeral is tomorrow and wants to see if she can take care of it before then Initial call taken by: De Nurse,  March 01, 2009 9:17 AM  Follow-up for Phone Call        she will be here at 3pm for a workin appt. unable to come earlier. to increase fluids Follow-up by: Golden Circle RN,  March 01, 2009 9:20 AM

## 2010-10-03 NOTE — Letter (Signed)
Summary: Generic Letter  [facility]  [address]   [place], [place] [address]   Phone: [phone]  Fax: [phone]    06/13/2010  re: Anelia Carriveau  To whom it may concern, Falan Hensler is under my care as a patient at [facility]. She is being seen for Attention Deficit without hyperactivity (ICD9 314.00). She was last seen on 05-05-10 for this problem. She was assessed by myself and was placed on Adderall 10 mg qAm and qNoon. She should have improved academic performace on this medication and I would expect that without it she would have poor performance. She will need a quiet room for testing but would not require any additional accommodations at this time.     Sincerely,   Jamie Brookes MD  Appended Document: Generic Letter LM on her voice mail that the papers she wanted are ready for her to pick up

## 2010-10-03 NOTE — Progress Notes (Signed)
Summary: Glucometer needed ASAP  Phone Note Call from Patient Call back at Home Phone [phone]   Summary of Call: Pt needs glucometer called into her pharmacy today because she will not have insurance after tomorrow.  Needs to go [facility] on [address]. Initial call taken by: [name],  October 27, 2007 3:32 PM  Follow-up for Phone Call        will have script faxed Follow-up by: Levander Campion MD,  October 27, 2007 3:58 PM

## 2010-10-03 NOTE — Miscellaneous (Signed)
Summary: triage   Clinical Lists Changes  t states she was rescheduled from appointment on 01/22/07 to today to see Dr. [name].at 2:00. however md is not in clinic today. pt has white vaginal discharge . has tried otc med . not helping.blood sugars elevated and vision blurred she states. appointment scheduled with Dr. Sandria Manly now . ..................................................................Marland KitchenTheresia Lo RN  Jan 24, 2007 2:19 PM

## 2010-10-03 NOTE — Progress Notes (Signed)
Summary: Insulin  Phone Note Call from Patient Call back at Home Phone [phone]   Reason for Call: Talk to Nurse Summary of Call: Wants to discuss insulin. Initial call taken by: [name],  May 07, 2008 8:29 AM  Follow-up for Phone Call        spoke with patient and she states insurance no longer pays for [employer]. she has been out of insulin for a week but has gotten some from her cousin off and on. she wants rx called to [facility] on  [address] for Lantus insulin in a vial and syringes. will send message to  MD. Follow-up by: Theresia Lo RN,  May 07, 2008 10:00 AM  Additional Follow-up for Phone Call Additional follow up Details #1::        filled by RN.  Pt made upcoming appt Additional Follow-up by: Johney Maine MD,  May 07, 2008 12:17 PM

## 2010-10-03 NOTE — Miscellaneous (Signed)
Summary: Metronidazole refill to [facility].   Clinical Lists Changes  Medications: Rx of METRONIDAZOLE 0.75 % GEL (METRONIDAZOLE) 1 applicatorful per vagina x 5 days;  #5 x 3;  Signed;  Entered by: Jamie Brookes MD;  Authorized by: Jamie Brookes MD;  Method used: Electronically to [facility] [phone]*, [address] [place], [place]  [address], Ph: [phone], Fax: [phone]    Prescriptions: METRONIDAZOLE 0.75 % GEL (METRONIDAZOLE) 1 applicatorful per vagina x 5 days  #5 x 3   Entered and Authorized by:   Jamie Brookes MD   Signed by:   Jamie Brookes MD on 04/19/2010   Method used:   Electronically to        [facility] [phone]* (retail)       [address]       [place], [place]  [address]       Ph: [phone]       Fax: [phone]   RxID:   [id number]

## 2010-10-03 NOTE — Assessment & Plan Note (Signed)
Summary: f/u diabetes/ACM   Vital Signs:  Patient Profile:   45 Years Old Female Height:     61.75 inches Weight:      166.8 pounds Pulse rate:   81 / minute BP sitting:   125 / 86  (right arm)  Pt. in pain?   no  Vitals Entered By: [name], (November 24, 2007 2:49 PM)              Is Patient Diabetic? Yes      PCP:  Johney Maine MD  Chief Complaint:  f/up DM>.  History of Present Illness: 1)DM: Pt doing well.  Taking medications as prescribed.  Denies increased urination, being thirsty, vision changes, signs of low sugars.   see template 2)Depression:  Lost job in January but much better now.  Spirits good.  Looking for job.  Is happy.  Happy w/ exercising.  Feels healthier.    Diabetes Management History:      The patient is a 45 years old female who comes in for evaluation of DM Type 2.  She has not been enrolled in the "Diabetic Education Program".  She states understanding of dietary principles and is following her diet appropriately.  No sensory loss is reported.  Self foot exams are not being performed.  She is checking home blood sugars.  She says that she is exercising.  Type of exercise includes: running.  Duration of exercise is estimated to be 40 min.  She is doing this 4 times per week.        Hypoglycemic symptoms are not occurring.  No hyperglycemic symptoms are reported.        There are no symptoms to suggest diabetic complications.  Other questions/concerns include: CBGs now 170s-270s.  No lows.  Treamill, cardio video, walking park most days week.  Pants feel loose in waist.  The following changes have been made to her treatment plan since last visit: diet changes and exercise program.       Past Medical History:    frequent episodes of bv and yeast    hx condyloma accuminata    hx endometriosis    hx SAB     thrombosed hemorrhoid 3/04    DEPRESSION (ICD-311)    RHINITIS, ALLERGIC (ICD-477.9)    IRRITABLE BOWEL SYNDROME (ICD-564.1)  ATTENTION DEFICIT, W/O HYPERACTIVITY (ICD-314.00)    ANXIETY (ICD-300.00)    DIABETES-TYPE 2 (ICD-250.00)         Risk Factors:  Exercise:  yes    Times per week:  4    Type:  running   Review of Systems      See HPI   Physical Exam  General:     Well-developed,well-nourished,in no acute distress; alert,appropriate and cooperative throughout examination Lungs:     Normal respiratory effort, chest expands symmetrically. Lungs are clear to auscultation, no crackles or wheezes. Heart:     Normal rate and regular rhythm. S1 and S2 normal without gallop, murmur, click, rub or other extra sounds. Extremities:     No clubbing, cyanosis, edema,  Psych:     Oriented X3, memory intact for recent and remote, normally interactive, and good eye contact.      Impression & Recommendations:  Problem # 1:  DIABETES-TYPE 2 (ICD-250.00) Assessment: Improved MARKED IMPROVEMENT over past 6 months.  Still w/ room to improve.  INcrase Lantus to 30 units two times a day or 60 units at bedtime depending on pt's comfort level.  Finances issues.  Ferol Luz to help pt get set up in free meds programs.  Gave 2 Lantus pens.  Pt wants Byetta..will set her up as well w/ free meds if possible.  Started LIsiniopril for cardioprotection.  Will check BMET at next visit.  Praised exercise regimine Her updated medication list for this problem includes:    Bayer Childrens Aspirin 81 Mg Chew (Aspirin) .Marland Kitchen... Take 1 tablet by mouth once a day    Byetta 10 Mcg Pen 10 Mcg/0.56ml Soln (Exenatide) ..... Inject subcutaneously two times a day as directed    Metformin Hcl 1000 Mg Tabs (Metformin hcl) .Marland Kitchen... 1 1/2 tabs by mouth q am and 1 tab by mouth q pm    Lantus Solostar 100 Unit/ml Soln (Insulin glargine) ..... Inject 50 units subcutaneously once daily    Lisinopril 2.5 Mg Tabs (Lisinopril) .Marland Kitchen... 1 tab by mouth daily  Orders: A1C-[facility] ([address]) UA Microalbumin-[facility] ([address]) [facility]- Est  Level 4 ([address])  Labs Reviewed:  HgBA1c: 8.2 (11/24/2007)   Creat: 0.79 (09/18/2007)   Microalbumin: 1+ (11/24/2007)   Problem # 2:  DEPRESSION (ICD-311) Assessment: Improved No changes.  doing well.  Orders: [facility]- Est  Level 4 ([address])   Complete Medication List: 1)  Bayer Childrens Aspirin 81 Mg Chew (Aspirin) .... Take 1 tablet by mouth once a day 2)  Byetta 10 Mcg Pen 10 Mcg/0.52ml Soln (Exenatide) .... Inject subcutaneously two times a day as directed 3)  Metrogel-vaginal 0.75 % Gel (Metronidazole) .Marland Kitchen.. 1 applicatorful into vagina twice a week 4)  Metformin Hcl 1000 Mg Tabs (Metformin hcl) .Marland Kitchen.. 1 1/2 tabs by mouth q am and 1 tab by mouth q pm 5)  Lantus Solostar 100 Unit/ml Soln (Insulin glargine) .... Inject 50 units subcutaneously once daily 6)  Strattera 18 Mg Caps (Atomoxetine hcl) .Marland Kitchen.. 1 tab by mouth daily 7)  Lisinopril 2.5 Mg Tabs (Lisinopril) .Marland Kitchen.. 1 tab by mouth daily  Diabetes Management Assessment/Plan:      The following lipid goals have been established for the patient: Total cholesterol goal of 200; LDL cholesterol goal of 100; HDL cholesterol goal of 40; Triglyceride goal of 200.     Patient Instructions: 1)  Please schedule a follow-up appointment in 3 months. 2)  Call Dr [name] with name brand of strips 3)  Try Lantus either 60 units at night or 30 every morning and night 4)  Keep up with exercising!  You are doing a great job!!!    Prescriptions: LISINOPRIL 2.5 MG  TABS (LISINOPRIL) 1 tab by mouth daily  #31 x 3   Entered and Authorized by:   Johney Maine MD   Signed by:   Johney Maine MD on 11/24/2007   Method used:   Electronically sent to ...       [address]       [address]       [place], [place]  [address]       Ph: [phone]       Fax: [phone]   RxID:   [id number] BYETTA 10 MCG PEN 10 MCG/0.04ML  SOLN (EXENATIDE) inject subcutaneously two times a day as directed  #1 x 3   Entered and Authorized by:   Johney Maine MD   Signed by:   Johney Maine MD on  11/24/2007   Method used:   Printed then faxed to ...       [facility] [phone]*       [address]  [place], [place]  [address]       Ph: [phone] or [phone]       Fax: [phone]   RxID:   [id number]  ] Laboratory Results   Urine Tests  Date/Time Received: November 24, 2007 3:21 PM  Date/Time Reported: November 24, 2007 3:54 PM   Microalbumin (urine): 1+ mg/L   Comments: ...................................................................[facility]  November 24, 2007 3:54 PM   Blood Tests   Date/Time Received: November 24, 2007 2:50  PM  Date/Time Reported: November 24, 2007 3:01 PM   HGBA1C: 8.2%   (Normal Range: Non-Diabetic - 3-6%   Control Diabetic - 6-8%)  Comments: ...............test performed by......Marland KitchenBonnie A. [place], [place] (ASCP)

## 2010-10-03 NOTE — Progress Notes (Signed)
Summary: samples?  Phone Note Call from Patient Call back at Home Phone [phone]   Reason for Call: Talk to Nurse Summary of Call: pt is out of lantus and wants to know if we have any samples Initial call taken by: Knox Royalty,  February 05, 2008 3:00 PM  Follow-up for Phone Call        Pt notified our office does not have Lantus solostar samples.  Advised to call in a few weeks to see if we have any samples Follow-up by: Alphia Kava,  February 05, 2008 4:11 PM

## 2010-10-03 NOTE — Progress Notes (Signed)
Summary: [place] request  Phone Note Call from Patient Call back at Home Phone [phone]   Reason for Call: Talk to Nurse Summary of Call: Pt is requesting to speak with an rn about depression. Initial call taken by: [name],  June 23, 2007 8:56 AM  Follow-up for Phone Call        pt reports she has been feeling blue for about a month, crying a lot.  requests appointment with Dr. [name]. also needs new rx for glucose monitor because her monitor has broken. there are no available appointment with Dr Ivery Quale available thru the month of November. offered work in appointment but she only wants to see Dr. [name].  consulted with Denya. will double book pt and notify MD  to make sure this is acceptable. appointment double booked for 07/03/07 at 9:00 Am. pt will use mother's  glucose monitor until that appointment time. Follow-up by: Theresia Lo RN,  June 23, 2007 9:52 AM  Additional Follow-up for Phone Call Additional follow up Details #1::        I know Barba well and am  happy to see her for this. Please let her know that I'll have to limit her appt to the depression.  Ask her to bring in an average of her sugars so that I can at least glance and address this issue to see if need to increase Lantus.  Thanks! Additional Follow-up by: Johney Maine    Additional Follow-up for Phone Call Additional follow up Details #2::    pt notified of above message. Follow-up by: Theresia Lo RN,  June 24, 2007 10:44 AM

## 2010-10-03 NOTE — Assessment & Plan Note (Signed)
Summary: read TB/eo  Nurse Visit    Prior Medications: BAYER CHILDRENS ASPIRIN 81 MG CHEW (ASPIRIN) Take 1 tablet by mouth once a day BYETTA 10 MCG PEN 10 MCG/0.04ML  SOLN (EXENATIDE) inject subcutaneously two times a day as directed METROGEL-VAGINAL 0.75 % GEL (METRONIDAZOLE) 1 applicatorful into vagina twice a week METFORMIN HCL 1000 MG TABS (METFORMIN HCL) 1 1/2 tabs by mouth q am and 1 tab by mouth q pm LANTUS SOLOSTAR 100 UNIT/ML  SOLN (INSULIN GLARGINE) inject 50 units subcutaneously once daily STRATTERA 18 MG  CAPS (ATOMOXETINE HCL) 1 tab by mouth daily LISINOPRIL 2.5 MG  TABS (LISINOPRIL) 1 tab by mouth daily DIFLUCAN 150 MG  TABS (FLUCONAZOLE) 1 tab by mouth x one. Repeat next day if symptoms continue    PPD Results    Date of reading: 02/16/2008    Results: < 5mm    Interpretation: negative     ]  Appended Document: read TB/eo    Clinical Lists Changes  Orders: Added new Service order of No Charge Patient Arrived (NCPA0) (NCPA0) - Signed

## 2010-10-03 NOTE — Assessment & Plan Note (Signed)
Summary: CPE/pap,df   Vital Signs:  Patient profile:   45 year old female Height:      61.75 inches Weight:      173.7 pounds BMI:     32.14 Temp:     97.6 degrees F oral Pulse rate:   93 / minute BP sitting:   134 / 90  (left arm) Cuff size:   regular  Vitals Entered By: Dedra Skeens CMA, (February 18, 2009 11:32 AM)   CC: cpe with pap Is Patient Diabetic? Yes  Pain Assessment Patient in pain? no        Primary Care Provider:  Johney Maine MD  CC:  cpe with pap.  History of Present Illness: physical w/ brief complaints  1)DM:  taking 80u lantus two times a day.  not always daily as she has no insurance.  gets lantus through church.  has not been checking sugars b/c can't afford strips.  has appt w/ [facility] next week.    2)stress: mom has cancer w/ mets.  gastric cancer.  breast cancer.  unclear if 2 types or not.  doign very poorly and in hospital.  has been w/ mom 24/7 for months.  family very supportive.  using faith to help deal with illness    3)vag d/c:  has very frequent episode of BV and yeast infections.  thinks she has one now. white d/c w/ irritation.  out of metrogel which she uses to prevent BV.    Habits & Providers  Alcohol-Tobacco-Diet     Tobacco Status: never  Exercise-Depression-Behavior     Does Patient Exercise: yes     Type of exercise: gym [employer]bmbership     Times/week: 1  Medications Prior to Update: 1)  Bayer Childrens Aspirin 81 Mg Chew (Aspirin) .... Take 1 Tablet By Mouth Once A Day 2)  Metformin Hcl 1000 Mg Tabs (Metformin Hcl) .Marland Kitchen.. 1 Tab By Mouth Two Times A Day 3)  Lantus 100 Unit/ml Soln (Insulin Glargine) .... 50 Two Times A Day 4)  Strattera 18 Mg  Caps (Atomoxetine Hcl) .Marland Kitchen.. 1 Tab By Mouth Daily 5)  Lisinopril 2.5 Mg  Tabs (Lisinopril) .Marland Kitchen.. 1 Tab By Mouth Daily 6)  Lantus 100 Unit/ml Soln (Insulin Glargine) .... Inject 50 Units Once Daily Subq 7)  Insulin Syringes .... Use Once Daily 8)  Relion Blood Glucose Test  Strp  (Glucose Blood) .... Use As Directed 9)  Relion Ultra Thin Lancets  Misc (Lancets) .... Use As Directed 10)  Metronidazole 500 Mg Tabs (Metronidazole) .Marland Kitchen.. 1 Tab By Mouth Two Times A Day For 7 Days  Allergies (verified): No Known Drug Allergies  Past History:  Past Medical History: Last updated: 11/24/2007 frequent episodes of bv and yeast hx condyloma accuminata hx endometriosis hx SAB  thrombosed hemorrhoid 3/04 DEPRESSION (ICD-311) RHINITIS, ALLERGIC (ICD-477.9) IRRITABLE BOWEL SYNDROME (ICD-564.1) ATTENTION DEFICIT, W/O HYPERACTIVITY (ICD-314.00) ANXIETY (ICD-300.00) DIABETES-TYPE 2 (ICD-250.00)    Past Surgical History: Last updated: 01/31/2007 btl - 06/03/1989 c-section - 10/04/1984 l. groin mass excision (endometriosis) - 07/04/1990  Family History: diabetes--mom, dad, grandparents No history biplolar mom w/ breast cancer  Social History: Smoking Status:  never  Review of Systems       The patient complains of weight gain and depression.  The patient denies fever, chest pain, syncope, dyspnea on exertion, peripheral edema, headaches, abdominal pain, genital sores, and suspicious skin lesions.   CV:  Complains of palpitations.  Physical Exam  General:  alert and well-nourished.   Head:  Normocephalic and atraumatic without obvious abnormalities. No apparent alopecia or balding. Eyes:  PERRLA extraocular muscles intact  Ears:  External ear exam shows no significant lesions or deformities.  Otoscopic examination reveals clear canals, tympanic membranes are intact bilaterally without bulging, retraction, inflammation or discharge. Hearing is grossly normal bilaterally. Nose:  External nasal examination shows no deformity or inflammation. Nasal mucosa are pink and moist without lesions or exudates. Mouth:  Oral mucosa and oropharynx without lesions or exudates.  Teeth in good repair. Breasts:  dense, fibrocystic.  no  masses.  no nipple abnormalities Lungs:  Normal  respiratory effort, chest expands symmetrically. Lungs are clear to auscultation, no crackles or wheezes. Heart:  Normal rate and regular rhythm. S1 and S2 normal without gallop, murmur, click, rub or other extra sounds. Abdomen:  Bowel sounds positive,abdomen soft and non-tender without masses, organomegaly or hernias noted. Genitalia:  Pelvic Exam: scant white d/c        External: normal female genitalia without lesions or masses        Vagina: normal without lesions or masses        Cervix: normal without lesions or masses        Adnexa: normal bimanual exam without masses or fullness        Uterus: normal by palpation        Pap smear: performed Msk:  normal ROM, no joint tenderness, no joint swelling, and no joint warmth.   Pulses:  normal radial and dp pulses   Extremities:  No clubbing, cyanosis, edema, or deformity noted with normal full range of motion of all joints.   Neurologic:  alert & oriented X3, cranial nerves II-XII intact, strength normal in all extremities, gait normal, and DTRs symmetrical and normal.   Skin:  Intact without suspicious lesions or rashes Psych:  normally interactive, good eye contact, and not anxious appearing.    tearful at times when discussing mom's health   Impression & Recommendations:  Problem # 1:  ROUTINE GYNECOLOGICAL EXAMINATION (ICD-V72.31) Assessment Unchanged pap obtained Orders: [facility] - Est  40-64 yrs ([address])  Problem # 2:  VAGINAL DISCHARGE (ICD-623.5) Assessment: Deteriorated no yeast or BV.  no need for oral tx.  refilled metro gel for patient to use for BV prevention.  ok to continue refills for this.  patient w/o insurance and high stress time.  ok to prescribe diflucan w/ 2 refills in future if has signficant d/c. Her updated medication list for this problem includes:    Metronidazole 0.75 % Gel (Metronidazole) .Marland Kitchen... 1 applicatorful per vagina x 5 days  Orders: Wet Prep- [facility] ([address])  Problem # 3:  DEPRESSION (ICD-311)  Assessment: Deteriorated No HI/SI.  has good support.  asked patient to return in 1-2 weeks to talk about how she's doing.  declines meds.  using faith for self tx  Problem # 4:  DIABETES-TYPE 2 (ICD-250.00) Assessment: Improved improved A1C.  Has been obtained Lantus through help from church as still no insurance.  She will meet w/ Rudell Cobb next week.  Just got Lantus refilled yesterday.  Requests rx for both pen and and needle Lantus as her church can help her purchase these but she isn't sure which would be easier for them.  I will try and give her samples at next visit.  She will see me on July 6th for a work in to discuss her diabetes.  Need to talk about her BP and encourage her to start lisinopril, which she never started taking.  The following medications were removed from the medication list:    Lantus 100 Unit/ml Soln (Insulin glargine) .Marland KitchenMarland KitchenMarland KitchenMarland Kitchen 50 two times a day Her updated medication list for this problem includes:    Bayer Childrens Aspirin 81 Mg Chew (Aspirin) .Marland Kitchen... Take 1 tablet by mouth once a day    Metformin Hcl 1000 Mg Tabs (Metformin hcl) .Marland Kitchen... 1 tab by mouth two times a day    Lisinopril 2.5 Mg Tabs (Lisinopril) .Marland Kitchen... 1 tab by mouth daily    Lantus 100 Unit/ml Soln (Insulin glargine) .Marland KitchenMarland KitchenMarland KitchenMarland Kitchen 80 u subcutaneously two times a day    Lantus Solostar 100 Unit/ml Soln (Insulin glargine) .Marland KitchenMarland KitchenMarland KitchenMarland Kitchen 80 units subcutaneously two times a day or as directed  Orders: A1C-[facility] ([address]) Lipid-[facility] ([id number])  Labs Reviewed: Creat: 0.79 (09/18/2007)   Microalbumin: 1+ (09/10/2008) Reviewed HgBA1c results: 8.8 (02/18/2009)  9.2 (09/10/2008)  Problem # 5:  palpitations has them when anxious.  can discuss at next visit if continues.    Complete Medication List: 1)  Bayer Childrens Aspirin 81 Mg Chew (Aspirin) .... Take 1 tablet by mouth once a day 2)  Metformin Hcl 1000 Mg Tabs (Metformin hcl) .Marland Kitchen.. 1 tab by mouth two times a day 3)  Strattera 18 Mg Caps (Atomoxetine hcl) .Marland Kitchen.. 1 tab by  mouth daily 4)  Lisinopril 2.5 Mg Tabs (Lisinopril) .Marland Kitchen.. 1 tab by mouth daily 5)  Lantus 100 Unit/ml Soln (Insulin glargine) .... 80 u subcutaneously two times a day 6)  Insulin Syringes  .... Use once daily 7)  Relion Blood Glucose Test Strp (Glucose blood) .... Use as directed 8)  Relion Ultra Thin Lancets Misc (Lancets) .... Use as directed 9)  Lantus Solostar 100 Unit/ml Soln (Insulin glargine) .... 80 units subcutaneously two times a day or as directed 10)  Metronidazole 0.75 % Gel (Metronidazole) .Marland Kitchen.. 1 applicatorful per vagina x 5 days  Other Orders: Pap Smear-[facility] ([id number])  Patient Instructions: 1)  come in July 1st or July 6th as a work-in to see Dr [name]. Prescriptions: METRONIDAZOLE 0.75 % GEL (METRONIDAZOLE) 1 applicatorful per vagina x 5 days  #5 x 3   Entered and Authorized by:   Johney Maine MD   Signed by:   Johney Maine MD on 02/18/2009   Method used:   Electronically to        [facility] [phone]* (retail)       [address]       [place], [place]  [address]       Ph: [phone]       Fax: [phone]   RxID:   [id number] LANTUS SOLOSTAR 100 UNIT/ML SOLN (INSULIN GLARGINE) 80 units subcutaneously two times a day or as directed  #3 x 3   Entered and Authorized by:   Johney Maine MD   Signed by:   Johney Maine MD on 02/18/2009   Method used:   Electronically to        [facility] [phone]* (retail)       [address]       [place], [place]  [address]       Ph: [phone]       Fax: [phone]   RxID:   [id number]   Laboratory Results   Blood Tests   Date/Time Received: February 18, 2009 11:31 AM  Date/Time Reported: February 18, 2009 11:49 AM   HGBA1C: 8.8%   (Normal Range: Non-Diabetic - 3-6%   Control Diabetic - 6-8%)  Comments: ...........test performed by...........Marland KitchenTerese Door, CMA  Date/Time Received: February 18, 2009 12:19 PM  Date/Time Reported: February 18, 2009 12:24 PM   [employer] Source: VAGINAL WBC/hpf:  RARE Bacteria/hpf: 1+  Rods Clue cells/hpf: none  Negative whiff Yeast/hpf: none Trichomonas/hpf: none Comments: ...........test performed by...........Marland KitchenTerese Door, CMA

## 2010-10-03 NOTE — Progress Notes (Signed)
Summary: MEDICATION  Phone Note Call from Patient Call back at Home Phone [phone]   Reason for Call: Talk to Nurse Summary of Call: PT ACCIDENTALLY GAVE HERSELF 2 SHOTS OF HER MEDICATION, SHE THINKS SHE MAY BE HAVING AN ALLERGIC REACTION, SHE NEEDS A CALL ASAP Initial call taken by: ERIN LEVAN,  November 05, 2006 4:19 PM  Follow-up for Phone Call        pt reports at 4:00, bs 281, took Byetta10 micrograms.  had to meet with her supervisor then 15 minutes later she inadvertently took additional byetta 10 micrograms,  momentarily forgetting she had already taken. immediately felt tingling sensation in throat,she was nervous and scared. Now 45 minutes later feels fine,  blood sugar now is 221. consulted with Dr Cathey Endow advised to check blood sugars every 2 hours, eat supper now.advised Byetta peaks 2.5 hours pt understands Follow-up by: Theresia Lo RN,  November 05, 2006 5:01 PM

## 2010-10-03 NOTE — Letter (Signed)
Summary: Out of Work  [facility]  [address]   [place], [place] [address]   Phone: [phone]  Fax:     February 19, 2007   Employee:  Judie Braddock    To Whom It May Concern:   For Medical reasons, please excuse the above named employee from work for the following dates:  Start:   01-31-07  End:   02-01-07  If you need additional information, please feel free to contact our office.         Sincerely,    Montrel Donahoe CMA,

## 2010-10-03 NOTE — Assessment & Plan Note (Signed)
Summary: wp   Vital Signs:  Patient Profile:   45 Years Old Female Height:     61.75 inches Weight:      172.3 pounds Temp:     98.8 degrees F oral Pulse rate:   78 / minute BP sitting:   125 / 93  (left arm)  Vitals Entered By: Johney Maine MD (June 11, 2008 11:36 AM)             Is Patient Diabetic? Yes      PCP:  Johney Maine MD  Chief Complaint:  f/u.  History of Present Illness: cc: dm, depression 1)DM: patient has lost job and can no longer afford byetta.  has run out of metformin and did not refill it.  also is getting lantus from a friend although she says she hasn't missed dosing.  admits to not eating healthy and has not been exercising.  wants to get her sugars down.  has not checked her sugars at home.  wants machine w/ cheaper strips.    2)vag d/c:  has white vag d/c .  thinks it's a yeast infection.  no concern for STDs  3)Depression/anxeity:  ms Manolis is very concerned that she may have bipolar.  says she has frequent highs w/ her mind racing.  when asked she does spend money she doesn't have.  she does not abuse alcohol or drugs.  she does not go days w/o sleeping.  she has lows after her highs.  she used to think highs were related to ADHD but now she has more concerns.  Reviewed medical history.  Updated and reviewed medications.       Past Medical History:    Reviewed history from 11/24/2007 and no changes required:       frequent episodes of bv and yeast       hx condyloma accuminata       hx endometriosis       hx SAB        thrombosed hemorrhoid 3/04       DEPRESSION (ICD-311)       RHINITIS, ALLERGIC (ICD-477.9)       IRRITABLE BOWEL SYNDROME (ICD-564.1)       ATTENTION DEFICIT, W/O HYPERACTIVITY (ICD-314.00)       ANXIETY (ICD-300.00)       DIABETES-TYPE 2 (ICD-250.00)             Review of Systems      See HPI   Physical Exam  General:     Well-developed,well-nourished,in no acute distress; alert,appropriate and cooperative  throughout examination Lungs:     Normal respiratory effort, chest expands symmetrically. Lungs are clear to auscultation, no crackles or wheezes. Heart:     Normal rate and regular rhythm. S1 and S2 normal without gallop, murmur, click, rub or other extra sounds. Genitalia:     white vag d/c normal introitus, no external lesions, mucosa pink and moist, normal uterus size and position, and no adnexal masses or tenderness.   Pulses:     normal dp pulses Extremities:     No clubbing, cyanosis, edema, or deformity noted with normal full range of motion of all joints.   Psych:     normally interactive, good eye contact, not anxious appearing, and not depressed appearing.      Impression & Recommendations:  Problem # 1:  DIABETES-TYPE 2 (ICD-250.00) Assessment: Deteriorated finances have limited patient's adherence to regimine.  discussed switching to 70/30 which is cheaper.  however  patient does  not eat breakfast regularly and there is a risk of lows.  patient desires switch to lantus syringes instead of solostar pen.  discussed healthy eating.  patient to resume metformin daily w/ goal to increase to two times a day.  wrote rx for relion, the cheapest glucometer and strips. samples given.  The following medications were removed from the medication list:    Byetta 10 Mcg Pen 10 Mcg/0.80ml Soln (Exenatide) ..... Inject subcutaneously two times a day as directed  Her updated medication list for this problem includes:    Bayer Childrens Aspirin 81 Mg Chew (Aspirin) .Marland Kitchen... Take 1 tablet by mouth once a day    Metformin Hcl 1000 Mg Tabs (Metformin hcl) .Marland Kitchen... 1 tab by mouth two times a day    Lantus 100 Unit/ml Soln (Insulin glargine) .Marland KitchenMarland KitchenMarland KitchenMarland Kitchen 50 two times a day    Lisinopril 2.5 Mg Tabs (Lisinopril) .Marland Kitchen... 1 tab by mouth daily    Lantus 100 Unit/ml Soln (Insulin glargine) ..... Inject 50 units once daily subq  Orders: A1C-[facility] ([address]) [facility]- Est  Level 4 (60454)  Labs Reviewed: HgBA1c: 10.1  (06/11/2008)   Creat: 0.79 (09/18/2007)   Microalbumin: 1+ (11/24/2007)   Problem # 2:  VAGINAL DISCHARGE (ICD-623.5) Assessment: New BV.  treat w/ flagyl.  LM on vm. The following medications were removed from the medication list:    Metrogel-vaginal 0.75 % Gel (Metronidazole) .Marland Kitchen... 1 applicatorful into vagina twice a week  Orders: Pap Smear-[facility] (09811-91478) Wet Prep- [facility] (29562) [facility]- Est  Level 4 (13086)   Problem # 3:  ANXIETY (ICD-300.00) Assessment: Deteriorated will review chart to see if there has been any concerns about bipolar disorder.  will discuss findings w/ patient and determine if tx needed.    Complete Medication List: 1)  Bayer Childrens Aspirin 81 Mg Chew (Aspirin) .... Take 1 tablet by mouth once a day 2)  Metformin Hcl 1000 Mg Tabs (Metformin hcl) .Marland Kitchen.. 1 tab by mouth two times a day 3)  Lantus 100 Unit/ml Soln (Insulin glargine) .... 50 two times a day 4)  Strattera 18 Mg Caps (Atomoxetine hcl) .Marland Kitchen.. 1 tab by mouth daily 5)  Lisinopril 2.5 Mg Tabs (Lisinopril) .Marland Kitchen.. 1 tab by mouth daily 6)  Lantus 100 Unit/ml Soln (Insulin glargine) .... Inject 50 units once daily subq 7)  Insulin Syringes  .... Use once daily 8)  Relion Blood Glucose Test Strp (Glucose blood) .... Use as directed 9)  Relion Ultra Thin Lancets Misc (Lancets) .... Use as directed 10)  Metronidazole 500 Mg Tabs (Metronidazole) .Marland Kitchen.. 1 tab by mouth two times a day for 7 days  Other Orders: Urinalysis-[facility] (00000)   Patient Instructions: 1)  Please schedule a follow-up appointment in 3 months. 2)  START ON METFORMIN ONCE A DAY.  WE MAY INCREASE TO two times a day  3)  DR Khalfani Weideman WILL CALL IN PRESCRIPTION FOR EITHER BACTERIAL OR FUNGAL VAGINAL INFECTION   Prescriptions: METRONIDAZOLE 500 MG TABS (METRONIDAZOLE) 1 tab by mouth two times a day for 7 days  #14 x 0   Entered and Authorized by:   Johney Maine MD   Signed by:   Johney Maine MD on 06/11/2008   Method used:   Electronically to         [facility]* (retail)       [address]       [place], [place]  [address]       Ph: [phone]  Fax: [phone]   RxID:   [id number] RELION ULTRA THIN LANCETS  MISC (LANCETS) use as directed  #100 x 11   Entered and Authorized by:   Johney Maine MD   Signed by:   Johney Maine MD on 06/11/2008   Method used:   Electronically to        [facility]* (retail)       [address]       [place], [place]  [address]       Ph: [phone]       Fax: [phone]   RxID:   [id number] RELION BLOOD GLUCOSE TEST  STRP (GLUCOSE BLOOD) use as directed  #100 x 11   Entered and Authorized by:   Johney Maine MD   Signed by:   Johney Maine MD on 06/11/2008   Method used:   Electronically to        [facility]* (retail)       [address]       [place], [place]  [address]       Ph: [phone]       Fax: [phone]   RxID:   [id number] METFORMIN HCL 1000 MG TABS (METFORMIN HCL) 1 tab by mouth two times a day  #60 x 6   Entered and Authorized by:   Johney Maine MD   Signed by:   Johney Maine MD on 06/11/2008   Method used:   Electronically to        [facility]* (retail)       [address]       [place], [place]  [address]       Ph: [phone]       Fax: [phone]   RxID:   [id number]  ] Laboratory Results   Urine Tests  Date/Time Received: June 11, 2008 11:45 AM  Date/Time Reported: June 11, 2008 11:49 AM   Routine Urinalysis   Color: yellow Appearance: Clear Glucose: 500   (Normal Range: Negative) Bilirubin: negative   (Normal Range: Negative) Ketone: negative   (Normal Range: Negative) Spec. Gravity: 1.020   (Normal Range: 1.003-1.035) Blood: negative   (Normal Range: Negative) pH: 7.5   (Normal Range: 5.0-8.0) Protein: trace   (Normal Range: Negative) Urobilinogen: 0.2   (Normal Range: 0-1) Nitrite: negative   (Normal Range: Negative) Leukocyte Esterace: negative   (Normal Range: Negative)    Comments:  ...........test performed by...........Marland KitchenTerese Door, CMA    Blood Tests   Date/Time Received: June 11, 2008 11:45 AM  Date/Time Reported: June 11, 2008 12:01 PM   HGBA1C: 10.1%   (Normal Range: Non-Diabetic - 3-6%   Control Diabetic - 6-8%)  Comments: ............test performed by...........Marland Kitchen Terese Door, CMA .............entered by...........Marland KitchenBonnie A. [place], [place] (ASCP)  Date/Time Received: June 11, 2008 12:27 PM  Date/Time Reported: June 11, 2008 12:31 PM   [employer] Source: vag WBC/hpf: 1-5 Bacteria/hpf: 3+  Cocci Clue cells/hpf: moderate  Positive whiff Yeast/hpf: none Trichomonas/hpf: none Comments: ...............test performed by......Marland KitchenBonnie A. [place], [place] (ASCP)

## 2010-10-03 NOTE — Assessment & Plan Note (Signed)
Summary: abd swelling and Obesity   Vital Signs:  Patient profile:   45 year old female Height:      61.75 inches Weight:      183.4 pounds BMI:     33.94 Temp:     98.1 degrees F oral Pulse rate:   90 / minute BP sitting:   120 / 86  (right arm) Cuff size:   large  Vitals Entered By: Garen Grams LPN (July 20, 2009 10:29 AM) CC: f/u meds and abd swelling Is Patient Diabetic? Yes Did you bring your meter with you today? No Pain Assessment Patient in pain? no        Primary Care Provider:  Jamie Brookes MD  CC:  f/u meds and abd swelling.  History of Present Illness: Abd "swelling":  45 y/o diabetic who comes in after a recent appointment for abd swelling where the patient was given HCTZ. She is very sensitive about her weight and has made many appointments concerning her weight gain. The patient would like to get off some of her meds and we discussed her medications and what they were for. She can consider coming off her HCTZ  med or combining it with the Losartan if she feels that her swelling is back to baseline.   WEight: Pt continues to struggle with weight. Every time she comes it we spend a lot of time discussing different weight loss stratigies. She appears to be very motivated but may be overwhelmed with the amount of information. She would like to lead a healthy life and get off some of her medications.  She also believes that her diabetes could be under control if she lost some weight. she is interested in speaking to a nutritionist.   Habits & Providers  Alcohol-Tobacco-Diet     Tobacco Status: quit > 6 months  Current Medications (verified): 1)  Bayer Childrens Aspirin 81 Mg Chew (Aspirin) .... Take 1 Tablet By Mouth Once A Day 2)  Metformin Hcl 1000 Mg Tabs (Metformin Hcl) .Marland Kitchen.. 1 Tab By Mouth Two Times A Day 3)  Losartan Potassium 50 Mg Tabs (Losartan Potassium) .... Take One Pill Daily 4)  Lantus 100 Unit/ml Soln (Insulin Glargine) .... 80 U  Subcutaneously Two Times A Day 5)  Insulin Syringes .... Use Once Daily 6)  Relion Blood Glucose Test  Strp (Glucose Blood) .... Use As Directed 7)  Relion Ultra Thin Lancets  Misc (Lancets) .... Use As Directed 8)  Lantus Solostar 100 Unit/ml Soln (Insulin Glargine) .... 80 Units Subcutaneously Two Times A Day or As Directed 9)  Metronidazole 0.75 % Gel (Metronidazole) .Marland Kitchen.. 1 Applicatorful Per Vagina X 5 Days 10)  Bupropion Sr 150 Mg .... Take Two Tablets Daily. 11)  Alli 60 Mg Caps (Orlistat) .... Take Two Pills With Your Calorie Dense Meals 12)  Hydrochlorothiazide 25 Mg Tabs (Hydrochlorothiazide) .... Take 1 Tablet Daily  Allergies (verified): No Known Drug Allergies  Review of Systems       weight gain since last visit but abdominal swelling has improved.   Physical Exam  General:   overweight, in no acute distress; alert,appropriate and cooperative throughout examination Lungs:  Normal respiratory effort, chest expands symmetrically. Lungs are clear to auscultation, no crackles or wheezes. Heart:  Normal rate and regular rhythm. S1 and S2 normal without gallop, murmur, click, rub or other extra sounds. Abdomen:  Bowel sounds positive,abdomen soft and non-tender without masses, organomegaly or hernias noted. overweight Psych:  Cognition and judgment appear intact. Alert  and cooperative with normal attention span and concentration. No apparent delusions, illusions, hallucinations   Impression & Recommendations:  Problem # 1:  ABDOMINAL DISTENSION (ICD-787.3) Assessment Improved Pt feels that her abdomen is not as distended (and I did not see her the last time she came it but it appears to be normal) but wants to stay on the HCTZ in order to keep the fluid off. I'm not convinced that her problem was a lot of fluid but the HCTZ may be serving a placebo role. May try to transition off at the next office visit. This may have been due to some weight gain as the patient is very sensitive  to any weight gain.   Orders: [facility]- Est Level  3 (81191)  Problem # 2:  OBESITY (ICD-278.00) Assessment: Unchanged Pt is very determined to lose weight and we talked about several options including internet sites for calorie tracking and having her meet with our nutritionist Dr. Gerilyn Pilgrim. Pt is very interested in meeting with Dr. Gerilyn Pilgrim and I think she would be motivated to makes some changes.   Gave pt recipe for fat free salad dressing.   Orders: [facility]- Est Level  3 (47829)  Complete Medication List: 1)  Bayer Childrens Aspirin 81 Mg Chew (Aspirin) .... Take 1 tablet by mouth once a day 2)  Metformin Hcl 1000 Mg Tabs (Metformin hcl) .Marland Kitchen.. 1 tab by mouth two times a day 3)  Losartan Potassium 50 Mg Tabs (Losartan potassium) .... Take one pill daily 4)  Lantus 100 Unit/ml Soln (Insulin glargine) .... 80 u subcutaneously two times a day 5)  Insulin Syringes  .... Use once daily 6)  Relion Blood Glucose Test Strp (Glucose blood) .... Use as directed 7)  Relion Ultra Thin Lancets Misc (Lancets) .... Use as directed 8)  Lantus Solostar 100 Unit/ml Soln (Insulin glargine) .... 80 units subcutaneously two times a day or as directed 9)  Metronidazole 0.75 % Gel (Metronidazole) .Marland Kitchen.. 1 applicatorful per vagina x 5 days 10)  Bupropion Sr 150 Mg  .... Take two tablets daily. 11)  Alli 60 Mg Caps (Orlistat) .... Take two pills with your calorie dense meals 12)  Hydrochlorothiazide 25 Mg Tabs (Hydrochlorothiazide) .... Take 1 tablet daily  Patient Instructions: 1)  check out  www.mycaloriecounter.com 2)  Lemon juice, 1 tsp of oregano, basil, spikes seasonings, 1 tbsp of Braggs amino acids, agave nectar (like honey) 3)  I will see you the beginning of January.

## 2010-10-03 NOTE — Miscellaneous (Signed)
Summary: DNKA   DPG   

## 2010-10-03 NOTE — Progress Notes (Signed)
Summary: re: Lantus/ts  Phone Note Call from Patient Call back at Home Phone [phone]   Caller: Patient Summary of Call: needs to talk to nurse/Strother is out of her DM meds and needs to know if we have samples of lantus  Initial call taken by: De Nurse,  August 08, 2010 10:20 AM  Follow-up for Phone Call        called pt. 'mail box is full'. unable to leave message. we do have Lantus pens only. Follow-up by: [name],,  August 08, 2010 12:00 PM  Additional Follow-up for Phone Call Additional follow up Details #1::        Patient will pick a Lantus pen.  Let her know when ready.  Ms. Bath want to know if the pen will equal the same amount as in the vial. If not then she will need 2 pens. Additional Follow-up by: Abundio Miu,  August 09, 2010 11:46 AM    Additional Follow-up for Phone Call Additional follow up Details #2::    CALLED PT AND LMVM TO ASK FOR ME.  Follow-up by: [name],,  August 10, 2010 5:46 PM  Additional Follow-up for Phone Call Additional follow up Details #3:: Details for Additional Follow-up Action Taken: spoke with pt and pt knows how to use the pen. 2 pens are in the refrigerator for her to pick up. Additional Follow-up by: [name],,  August 11, 2010 10:33 AM

## 2010-10-03 NOTE — Miscellaneous (Signed)
Summary: change to Buppropion SR  Clinical Lists Changes Pharmacy at [facility] Dept does not have Bupropion XL 300 so we will switch to Bupropion SR 300mg .  Medications: Removed medication of BUDEPRION XL 150 MG XR24H-TAB (BUPROPION HCL) take one pill every morning Changed medication from BUPROPION HCL 300 MG XR24H-TAB (BUPROPION HCL) take one pill in the morning. to * BUPROPION SR 150 MG take two tablets daily. - Signed Rx of BUPROPION SR 150 MG take two tablets daily.;  #64 x 3;  Signed;  Entered by: Jamie Brookes MD;  Authorized by: Jamie Brookes MD;  Method used: [facility] [address] [id number]*, [address], [place], [place], [place]  [address], Ph: [phone] or [phone], Fax: [phone]    Prescriptions: BUPROPION SR 150 MG take two tablets daily.  #64 x 3   Entered and Authorized by:   Jamie Brookes MD   Signed by:   Jamie Brookes MD on 06/08/2009   Method used:   [facility]ed to ...       [facility] [address] [phone]* (retail)       [address]       [place], [place]  [address]       Ph: [phone] or [phone]       Fax: [phone]   RxID:   [id number]

## 2010-10-03 NOTE — Assessment & Plan Note (Signed)
Summary: DNKA            Complete Medication List: 1)  Bayer Childrens Aspirin 81 Mg Chew (Aspirin) .... Take 1 tablet by mouth once a day 2)  Byetta 10 Mcg Pen 10 Mcg/0.69ml Soln (Exenatide) .... Inject subcutaneously two times a day as directed 3)  Metrogel-vaginal 0.75 % Gel (Metronidazole) .Marland Kitchen.. 1 applicatorful into vagina twice a week 4)  Strattera 25 Mg Caps (Atomoxetine hcl) 5)  Metformin Hcl 1000 Mg Tabs (Metformin hcl) .Marland Kitchen.. 1 1/2 tabs by mouth q am and 1 tab by mouth q pm 6)  Lantus Solostar 100 Unit/ml Soln (Insulin glargine) .... Inject 30 units subcutaneously once daily     ]

## 2010-10-03 NOTE — Letter (Signed)
Summary: Results Follow-up Letter  [facility]  [address]   [place], [place] [address]   Phone: [phone]  Fax: [phone]    06/18/2008  [address] [place], [place]  [address]  Dear Ms. Jaimes,   The following are the results of your recent test(s):  Test     Result     Pap Smear    Normal _________________________________________________________  Please call for an appointment Or _________________________________________________________ _________________________________________________________ _________________________________________________________  Sincerely,  Johney Maine MD [facility]           Appended Document: Results Follow-up Letter sent

## 2010-10-03 NOTE — Progress Notes (Signed)
Summary: R hand pain and bilateral breast pain.   Phone Note Call from Patient Call back at [facility] Phone [phone]   Caller: Patient Summary of Call: PAtient woke up this am with R hand numb. has been numb since primarily in the finger tips of her entire hand.  she had her hands massaged/nails done today to see if this would help but it didn't and she became worried because it was still continuing after so long that she called the outpatient line.  she denies any color change to her hands, she denies any weakness or slurred speech.  she denies any injury.  she states that her hand is the normal color.  she does note that she has DM2.    she also was concerned about pain in the outer quadrants of her breasts that she thinks may be related to fibrous tissue that is from increased caffeine intake.   advised her that the 2 are likely unrelated.  advised her to call in the AM for appt.  she thinks the needs an MRI but i suggested she get examined first to get a better feel for what is going on.  advised her that is fhe develops color change to the hand, weakness or slurred speech she get checked out sooner.  she expressed understanding.  Initial call taken by: Ancil Boozer  MD,  November 10, 2008 9:35 PM

## 2010-10-03 NOTE — Assessment & Plan Note (Signed)
Summary: RLQ pain and mid low back pain.    Vital Signs:  Patient profile:   45 year old female Weight:      184 pounds Temp:     98.3 degrees F oral Pulse rate:   78 / minute Pulse rhythm:   regular BP sitting:   133 / 93  (right arm) Cuff size:   regular  Vitals Entered By: Loralee Pacas CMA (August 04, 2009 4:52 PM) CC: ?bladder/kidney infection  sx x 1 1/2 weeks   Primary Care Provider:  Jamie Brookes MD  CC:  ?bladder/kidney infection  sx x 1 1/2 weeks.  History of Present Illness: 45 y/o female with RLQ pain x 1 week. It started 1-2 days after Thanksgiving and she describes pain/discomfort in her RLQ as well as midline low back pain. She has not had any urinary frequency, burning or bleeding. No fever or chills, She vomited 3 times last week but has not had any vomiting this week. She has no new sexual partners and when asked if she is sexually active she says "not really".  She has been drinnking lots of water today. She says she has been laying in bed all day. Pt says she has had unusual mentration the last 3 months and isn't sure when her true las period was. She thinks it was the first half of last month but is not sure. No history of kidney stones.   Habits & Providers  Alcohol-Tobacco-Diet     Tobacco Status: never  Current Medications (verified): 1)  Bayer Childrens Aspirin 81 Mg Chew (Aspirin) .... Take 1 Tablet By Mouth Once A Day 2)  Metformin Hcl 1000 Mg Tabs (Metformin Hcl) .Marland Kitchen.. 1 Tab By Mouth Two Times A Day 3)  Losartan Potassium 50 Mg Tabs (Losartan Potassium) .... Take One Pill Daily 4)  Lantus 100 Unit/ml Soln (Insulin Glargine) .... 80 U Subcutaneously Two Times A Day 5)  Insulin Syringes .... Use Once Daily 6)  Relion Blood Glucose Test  Strp (Glucose Blood) .... Use As Directed 7)  Relion Ultra Thin Lancets  Misc (Lancets) .... Use As Directed 8)  Lantus Solostar 100 Unit/ml Soln (Insulin Glargine) .... 80 Units Subcutaneously Two Times A Day or As  Directed 9)  Metronidazole 0.75 % Gel (Metronidazole) .Marland Kitchen.. 1 Applicatorful Per Vagina X 5 Days 10)  Bupropion Sr 150 Mg .... Take Two Tablets Daily. 11)  Alli 60 Mg Caps (Orlistat) .... Take Two Pills With Your Calorie Dense Meals 12)  Hydrochlorothiazide 25 Mg Tabs (Hydrochlorothiazide) .... Take 1 Tablet Daily  Allergies (verified): No Known Drug Allergies  Social History: Smoking Status:  never  Review of Systems        vitals reviewed and pertinent negatives and positives seen in HPI   Physical Exam  General:  overweight appearing, in no acute distress but does not appear to feel well; alert,appropriate and cooperative throughout examination Lungs:  Normal respiratory effort, chest expands symmetrically. Lungs are clear to auscultation, no crackles or wheezes. Heart:  Normal rate and regular rhythm. S1 and S2 normal without gallop, murmur, click, rub or other extra sounds. Abdomen:  Bowel sounds positive,abdomen soft without masses, organomegaly or hernias noted. She has RLQ tenderness to deep palpation. NO CVA tenderness   Impression & Recommendations:  Problem # 1:  ABDOMINAL PAIN RIGHT LOWER QUADRANT (ICD-789.03) Assessment New Pt had neg UA and neg Upreg. She continued to have RLQ pain in the clinic and was given a Toradol inj. She  was also scheduled for a vaginal [name] and told to come back in the morning to have a CBC. DDx is appendicitis, ovarian mass, menstrual pain? Pt may need to be seen again tomorrow to go over results if not feeling better.   Orders: U Preg-[facility] (81025) Ultrasound (Ultrasound) Ketorolac-Toradol 15mg  (Y3016) [facility]- Est Level  3 (99213)Future Orders: CBC-[facility] (01093) ... 08/10/2010  Problem # 2:  DYSURIA (ICD-788.1) Assessment: Comment Only Pt is complaining of pain in her abdomen that she feels when she urinates but it does not actually hurt to urinate. She feels the pain at other times as well.   Orders: Urinalysis-[facility] (00000) [facility]- Est Level  3  (23557)  Complete Medication List: 1)  Bayer Childrens Aspirin 81 Mg Chew (Aspirin) .... Take 1 tablet by mouth once a day 2)  Metformin Hcl 1000 Mg Tabs (Metformin hcl) .Marland Kitchen.. 1 tab by mouth two times a day 3)  Losartan Potassium 50 Mg Tabs (Losartan potassium) .... Take one pill daily 4)  Lantus 100 Unit/ml Soln (Insulin glargine) .... 80 u subcutaneously two times a day 5)  Insulin Syringes  .... Use once daily 6)  Relion Blood Glucose Test Strp (Glucose blood) .... Use as directed 7)  Relion Ultra Thin Lancets Misc (Lancets) .... Use as directed 8)  Lantus Solostar 100 Unit/ml Soln (Insulin glargine) .... 80 units subcutaneously two times a day or as directed 9)  Metronidazole 0.75 % Gel (Metronidazole) .Marland Kitchen.. 1 applicatorful per vagina x 5 days 10)  Bupropion Sr 150 Mg  .... Take two tablets daily. 11)  Alli 60 Mg Caps (Orlistat) .... Take two pills with your calorie dense meals 12)  Hydrochlorothiazide 25 Mg Tabs (Hydrochlorothiazide) .... Take 1 tablet daily  Laboratory Results   Urine Tests  Date/Time Received: August 04, 2009 5:02 PM  Date/Time Reported: August 04, 2009 5:26 PM   Routine Urinalysis   Color: yellow Appearance: Clear Glucose: negative   (Normal Range: Negative) Bilirubin: negative   (Normal Range: Negative) Ketone: negative   (Normal Range: Negative) Spec. Gravity: 1.020   (Normal Range: 1.003-1.035) Blood: negative   (Normal Range: Negative) pH: 7.0   (Normal Range: 5.0-8.0) Protein: negative   (Normal Range: Negative) Urobilinogen: 0.2   (Normal Range: 0-1) Nitrite: negative   (Normal Range: Negative) Leukocyte Esterace: negative   (Normal Range: Negative)    Urine HCG: negative Comments: ...............test performed by......Marland KitchenBonnie A. [place], MLS (ASCP)cm     Appended Document: RLQ pain and mid low back pain.  pt given toradol 60mg /51mL 1 mL given in L deltoid, and 1mL given in R deltoid. pt tolorated medication without complications

## 2010-10-03 NOTE — Progress Notes (Signed)
Summary: Appt  Phone Note Call from Patient Call back at Home Phone [phone]   Summary of Call: Pt is calling to schedule an appt with Dr. [name] @ the end of Nov. or beginning of Dec. to check her kidney's and for side pain she is experiencing.  I told pt that dr was booked for the rest of the year, but that we would ask Dr if she needs to be overbooked, or if she can wait until Jan. to be seen. Initial call taken by: [name],  July 22, 2007 10:56 AM  Follow-up for Phone Call        If pt having side pain, maybe this should be  a SDA or acute continuity.  We recently check creatinine but can recheck next month w/o a MD appt.  Please notify pt.  Also she missed appt w/ Dr Pascal Lux, let her know this as an FYI Follow-up by: Johney Maine  Additional Follow-up for Phone Call Additional follow up Details #1::        called pt. LMVM to sched. SDA for pain. Additional Follow-up by: [name],,  July 24, 2007 8:43 AM

## 2010-10-03 NOTE — Letter (Signed)
Summary: Out of Work  [facility]  [address]   [place], [place] [address]   Phone: [phone]  Fax: [phone]    September 18, 2007   Employee:  Tracy Weiss    To Whom It May Concern:   For Medical reasons, please excuse the above named employee from work for the following dates:  Start:  September 18, 2007 9:24 AM   End:  September 19, 2007   If you need additional information, please feel free to contact our office.         Sincerely,    Lillia Pauls CMA

## 2010-10-03 NOTE — Letter (Signed)
Summary: Results Follow-up Letter  [facility]  [address]   [place], [place] [address]   Phone: [phone]  Fax: [phone]    03/11/2007  [address] [place], [place]  [address]  Dear Ms. Feldhaus,   The following are the results of your recent test(s):  Test     Result     Pap Smear    Normal___x____  _________________________________________________________  Please call for an appointment Or _________________________________________________________ _________________________________________________________ _________________________________________________________  Sincerely,  Johney Maine MD [facility]           Appended Document: Results Follow-up Letter Patient letter mailed

## 2010-10-03 NOTE — Assessment & Plan Note (Signed)
Summary: ADHD, vaccines   Vital Signs:  Patient profile:   45 year old female Height:      61.75 inches Weight:      173 pounds BMI:     32.01 Temp:     97.9 degrees F oral Pulse rate:   90 / minute BP sitting:   138 / 89  (left arm) Cuff size:   regular  Vitals Entered By: Jimmy Footman, CMA (May 05, 2010 2:50 PM) CC: ADHD, immunization catch up Is Patient Diabetic? No   Primary Care Provider:  Jamie Brookes MD  CC:  ADHD and immunization catch up.  History of Present Illness: ADHD: Pt is having a hard time focusing in school. We recently started Adderall and she says that it is helping her study but that she has had to go up on the dose. She says that the 10 mg dose helps more than the 5 mg dose. She is taking the medication too late in the day (4 pm) and is staying up late at night but says that she likes that because she gets more done at night. Talked about getting good sleep while in school so she can make good grades.   Immunization: Pt is currently in school and is in need of some vaccines to be caught up and continue school. Those will be given today. Pt has already spoken to [place], Charity fundraiser to figure out which vaccines she needs.    Habits & Providers  Alcohol-Tobacco-Diet     Tobacco Status: never  Current Medications (verified): 1)  Bayer Childrens Aspirin 81 Mg Chew (Aspirin) .... Take 1 Tablet By Mouth Once A Day 2)  Metformin Hcl 1000 Mg Tabs (Metformin Hcl) .Marland Kitchen.. 1 Tab By Mouth Two Times A Day 3)  Losartan Potassium 50 Mg Tabs (Losartan Potassium) .... Take One Pill Daily 4)  Lantus 100 Unit/ml Soln (Insulin Glargine) .... 80 U Subcutaneously Two Times A Day 5)  Insulin Syringes .... Use Once Daily 6)  Relion Blood Glucose Test  Strp (Glucose Blood) .... Use As Directed 7)  Relion Ultra Thin Lancets  Misc (Lancets) .... Use As Directed 8)  Lantus Solostar 100 Unit/ml Soln (Insulin Glargine) .... 80 Units Subcutaneously Two Times A Day or As Directed 9)   Metronidazole 0.75 % Gel (Metronidazole) .Marland Kitchen.. 1 Applicatorful Per Vagina X 5 Days 10)  Bupropion Sr 150 Mg .... Take Two Tablets Daily. 11)  Alli 60 Mg Caps (Orlistat) .... Take Two Pills With Your Calorie Dense Meals 12)  Hydrochlorothiazide 25 Mg Tabs (Hydrochlorothiazide) .... Take 1 Tablet Daily 13)  Adderall 10 Mg Tabs (Amphetamine-Dextroamphetamine) .... Take 1 Tab in The Am, 1 Tab By 2:00 Pm 14)  Fluconazole 150 Mg Tabs (Fluconazole) .... Take 1 Pill At The First Symptoms of Vaginal Infection. Take Another One in 4 Days If No Improvement. 15)  Novolog 100 Unit/ml Soln (Insulin Aspart) .Marland Kitchen.. 10 Units At Each Meal  Allergies (verified): No Known Drug Allergies  Review of Systems       no anorexia, some insomnia (b/c pt taking medication too late), some weight loss, no heart palpitations  Physical Exam  General:  Well-developed,well-nourished,in no acute distress; alert,appropriate and cooperative throughout examination Psych:  Cognition and judgment appear intact. Alert and cooperative with normal attention span and concentration. No apparent delusions, illusions, hallucinations. Pt appears a little hyperactive today.    Impression & Recommendations:  Problem # 1:  ATTENTION DEFICIT, W/O HYPERACTIVITY (ICD-314.00) Assessment Improved Pt says she is  doing well in school. She needs to go up on the Adderall. She says the 10 mg works best for her. She recieved Rx x3 scripts. Will see her back in 3 weeks.   Orders: [facility]- Est Level  3 (51761)  Problem # 2:  vaccines Assessment: Comment Only Pt recieved vaccines needed for school today.  Complete Medication List: 1)  Bayer Childrens Aspirin 81 Mg Chew (Aspirin) .... Take 1 tablet by mouth once a day 2)  Metformin Hcl 1000 Mg Tabs (Metformin hcl) .Marland Kitchen.. 1 tab by mouth two times a day 3)  Losartan Potassium 50 Mg Tabs (Losartan potassium) .... Take one pill daily 4)  Lantus 100 Unit/ml Soln (Insulin glargine) .... 80 u subcutaneously  two times a day 5)  Insulin Syringes  .... Use once daily 6)  Relion Blood Glucose Test Strp (Glucose blood) .... Use as directed 7)  Relion Ultra Thin Lancets Misc (Lancets) .... Use as directed 8)  Lantus Solostar 100 Unit/ml Soln (Insulin glargine) .... 80 units subcutaneously two times a day or as directed 9)  Metronidazole 0.75 % Gel (Metronidazole) .Marland Kitchen.. 1 applicatorful per vagina x 5 days 10)  Bupropion Sr 150 Mg  .... Take two tablets daily. 11)  Hydrochlorothiazide 25 Mg Tabs (Hydrochlorothiazide) .... Take 1 tablet daily 12)  Adderall 10 Mg Tabs (Amphetamine-dextroamphetamine) .... Take 1 tab in the am, 1 tab by 2:00 pm 13)  Fluconazole 150 Mg Tabs (Fluconazole) .... Take 1 pill at the first symptoms of vaginal infection. take another one in 4 days if no improvement. 14)  Novolog 100 Unit/ml Soln (Insulin aspart) .Marland Kitchen.. 10 units at each meal  Other Orders: Tdap => 12yrs IM [phone]) Admin 1st Vaccine (10626) Admin 1st Vaccine [facility]) [phone]) Hepatitis B Vaccine >60yrs (27035) Admin of Any Addtl Vaccine (00938) Admin of Any Addtl Vaccine (State) (18299B) Prescriptions: ADDERALL 10 MG TABS (AMPHETAMINE-DEXTROAMPHETAMINE) take 1 tab in the am, 1 tab by 2:00 pm  #62 x 0   Entered and Authorized by:   Jamie Brookes MD   Signed by:   Terese Door on 05/05/2010   Method used:   Handwritten   RxID:   [id number]       Hepatitis B Vaccine # 2    Vaccine Type: HepB Adult    Site: right deltoid    Mfr: GlaxoSmithKline    Dose: 0.1 ml    Route: IM    Given by: Theresia Lo RN    Exp. Date: 05/26/2012    Lot #: [id number]    VIS given: 03/20/06 version given May 05, 2010.  Tetanus/Td Vaccine    Vaccine Type: Tdap    Site: left deltoid    Mfr: GlaxoSmithKline    Dose: 0.5 ml    Route: IM    Given by: Theresia Lo RN    Exp. Date: 03/02/2012    Lot #: [id number]    VIS given: 07/21/08 version given May 05, 2010.  MMR given. state supply used because  patient is needing immunization for college and this is supplied by the state.  all immunizations entered in [place]. Theresia Lo RN  May 05, 2010 4:13 PM

## 2010-10-03 NOTE — Progress Notes (Signed)
Summary: Requesting excuse note for work  Phone Note Call from Patient Call back at Home Phone [phone]   Reason for Call: Talk to Nurse Summary of Call: pt is calling to find out if she can get another Doctor's excuse note for work from 05/30, she lost her last one that was given Initial call taken by: ERIN LEVAN,  February 19, 2007 11:57 AM  Follow-up for Phone Call        called pt. (last ov 01-31-07), [facility] Follow-up by: [name],,  February 19, 2007 3:03 PM  Additional Follow-up for Phone Call Additional follow up Details #1::        note up front for pick up Additional Follow-up by: [name],,  February 20, 2007 10:28 AM

## 2010-10-03 NOTE — Progress Notes (Signed)
Summary: wi request  Phone Note Call from Patient Call back at Home Phone [phone]   Reason for Call: Talk to Nurse Summary of Call: pt is requesting a wi appt, she sts her sugar is running high Initial call taken by: ERIN LEVAN,  December 12, 2006 3:20 PM  Follow-up for Phone Call        Phone call to pt - no answer left message on answering machine to call our office.   Follow-up by: Taylah Dubiel MARTIN RN,  December 12, 2006 3:36 PM  Additional Follow-up for Phone Call Additional follow up Details #1::        Pt returned call stating her blood sugars have been running high for past few weeks, and today it is 501 mg/dl, and she is taking her medications regularly.  Advised pt that this blood sugar is extremely high, and she needs to come in to our office for evaluation today, pt states she cannot leave work to come into office.  Spoke with Dr. [name] she advised pt come in to office today to be seen, and if pt will not come here, she needs to go to Urgent Care or ED to be evaluated.  Gave pt the above message from Dr. [name] - she states she will go to Urgent Care after she gets off work, and she will call our office in the morning to f/u.   Additional Follow-up by: Tashawn Laswell MARTIN RN,  December 12, 2006 4:20 PM

## 2010-10-03 NOTE — Assessment & Plan Note (Signed)
Summary: ADD, Diabetes type 2   Vital Signs:  Patient profile:   45 year old female Weight:      178.2 pounds Temp:     98.8 degrees F oral Pulse rate:   91 / minute Pulse rhythm:   regular BP sitting:   128 / 78  (left arm) Cuff size:   regular CC: ADD, DM2 Comments not feeling as sharp mentally x 2 months    Primary Care Provider:  Jamie Brookes MD  CC:  ADD and DM2.  History of Present Illness: Difficulty paying attention/ADD: Pt has been trying to study some material before she goes back to school and she is finding it difficult to concentrate. She is having to go over the material several times, her mind wanders. She use to take Straterra and did not like it very well. Her symptoms have all started up again in the l;ast 6 months. She feels fatigued, has a fear of fialing and feels like she is studying but not progressing.   Diabetes 2: Pt saw opthomology yesterday. She does not have any diabetic retinopathy but does need glasses. She can not afford glasses at this time. Her CBg's are running in the 300's. She can not afford Lantus. She did get some from a relative but that ran out in April. She went to [facility] and found some over the counter insulin and has been taking that. She does not have a meter though. It is broken. So she is not regularily checking her blood sugars. She is out of several needed supplies. She is suppose to go to the MAP program for an appointment tomorrow and will hopefully get some help with her meds.   I spent more than 40 minutes with this patient with face-to-face time.   Current Medications (verified): 1)  Bayer Childrens Aspirin 81 Mg Chew (Aspirin) .... Take 1 Tablet By Mouth Once A Day 2)  Metformin Hcl 1000 Mg Tabs (Metformin Hcl) .Marland Kitchen.. 1 Tab By Mouth Two Times A Day 3)  Losartan Potassium 50 Mg Tabs (Losartan Potassium) .... Take One Pill Daily 4)  Lantus 100 Unit/ml Soln (Insulin Glargine) .... 80 U Subcutaneously Two Times A Day 5)  Insulin  Syringes .... Use Once Daily 6)  Relion Blood Glucose Test  Strp (Glucose Blood) .... Use As Directed 7)  Relion Ultra Thin Lancets  Misc (Lancets) .... Use As Directed 8)  Lantus Solostar 100 Unit/ml Soln (Insulin Glargine) .... 80 Units Subcutaneously Two Times A Day or As Directed 9)  Metronidazole 0.75 % Gel (Metronidazole) .Marland Kitchen.. 1 Applicatorful Per Vagina X 5 Days 10)  Bupropion Sr 150 Mg .... Take Two Tablets Daily. 11)  Alli 60 Mg Caps (Orlistat) .... Take Two Pills With Your Calorie Dense Meals 12)  Hydrochlorothiazide 25 Mg Tabs (Hydrochlorothiazide) .... Take 1 Tablet Daily 13)  Amphetamine-Dextroamphetamine 5 Mg Tabs (Amphetamine-Dextroamphetamine) .... Take 1 Pill At Breakfast and 1 At [facility]. 14)  Fluconazole 150 Mg Tabs (Fluconazole) .... Take 1 Pill At The First Symptoms of Vaginal Infection. Take Another One in 4 Days If No Improvement. 15)  Novolog 100 Unit/ml Soln (Insulin Aspart) .Marland Kitchen.. 10 Units At Each Meal  Allergies (verified): No Known Drug Allergies  Review of Systems        vitals reviewed and pertinent negatives and positives seen in HPI   Physical Exam  General:  Well-developed,well-nourished,in no acute distress; alert,appropriate and cooperative throughout examination   Impression & Recommendations:  Problem # 1:  DIABETES-TYPE 2 (ICD-250.00)  Assessment Deteriorated Pt met with pharmacist while in the office. She was given 1 lantus and 2 novolog samples. She was taught to give herself 5 units of Novolog with each meal and give 50 units of Lantus daily. She is to get  MAP assistance soon. I wrote a RX for Relion glucometer, have 3 boxes of syringes, and refilled other necessities. Hopefully this patient will get back on the road to health. her goal is to get off insulin. She is exercising. Her A1c is actually donwn compared to last time despite being off insulin for a month or more.   Her updated medication list for this problem includes:    Bayer Childrens  Aspirin 81 Mg Chew (Aspirin) .Marland Kitchen... Take 1 tablet by mouth once a day    Metformin Hcl 1000 Mg Tabs (Metformin hcl) .Marland Kitchen... 1 tab by mouth two times a day    Losartan Potassium 50 Mg Tabs (Losartan potassium) .Marland Kitchen... Take one pill daily    Lantus 100 Unit/ml Soln (Insulin glargine) .Marland KitchenMarland KitchenMarland KitchenMarland Kitchen 80 u subcutaneously two times a day    Lantus Solostar 100 Unit/ml Soln (Insulin glargine) .Marland KitchenMarland KitchenMarland KitchenMarland Kitchen 80 units subcutaneously two times a day or as directed    Novolog 100 Unit/ml Soln (Insulin aspart) .Marland KitchenMarland KitchenMarland KitchenMarland Kitchen 10 units at each meal  Orders: A1C-[facility] ([address]) [facility]- Est  Level 4 ([address])  Problem # 2:  ATTENTION DEFICIT, W/O HYPERACTIVITY (ICD-314.00) Assessment: Deteriorated Started Adderall in this patient. Will reasses in 3 months. STarted on 5 mg dose.   Orders: [facility]- Est  Level 4 ([address])  Complete Medication List: 1)  Bayer Childrens Aspirin 81 Mg Chew (Aspirin) .... Take 1 tablet by mouth once a day 2)  Metformin Hcl 1000 Mg Tabs (Metformin hcl) .Marland Kitchen.. 1 tab by mouth two times a day 3)  Losartan Potassium 50 Mg Tabs (Losartan potassium) .... Take one pill daily 4)  Lantus 100 Unit/ml Soln (Insulin glargine) .... 80 u subcutaneously two times a day 5)  Insulin Syringes  .... Use once daily 6)  Relion Blood Glucose Test Strp (Glucose blood) .... Use as directed 7)  Relion Ultra Thin Lancets Misc (Lancets) .... Use as directed 8)  Lantus Solostar 100 Unit/ml Soln (Insulin glargine) .... 80 units subcutaneously two times a day or as directed 9)  Metronidazole 0.75 % Gel (Metronidazole) .Marland Kitchen.. 1 applicatorful per vagina x 5 days 10)  Bupropion Sr 150 Mg  .... Take two tablets daily. 11)  Alli 60 Mg Caps (Orlistat) .... Take two pills with your calorie dense meals 12)  Hydrochlorothiazide 25 Mg Tabs (Hydrochlorothiazide) .... Take 1 tablet daily 13)  Amphetamine-dextroamphetamine 5 Mg Tabs (Amphetamine-dextroamphetamine) .... Take 1 pill at breakfast and 1 at lunch. 14)  Fluconazole 150 Mg Tabs (Fluconazole) .... Take 1  pill at the first symptoms of vaginal infection. take another one in 4 days if no improvement. 15)  Novolog 100 Unit/ml Soln (Insulin aspart) .Marland Kitchen.. 10 units at each meal  Patient Instructions: 1)  pap smear in june 2)  mamogram was normal 3)  pick up meds 4)  see you back in june.  Prescriptions: NOVOLOG 100 UNIT/ML SOLN (INSULIN ASPART) 10 units at each meal  #31 x 6   Entered and Authorized by:   Jamie Brookes MD   Signed by:   Jamie Brookes MD on 02/03/2010   Method used:   Electronically to        [facility] [phone]* (retail)       [address]  [place], [place]  [address]       Ph: [phone]       Fax: [phone]   RxID:   [id number] METFORMIN HCL 1000 MG TABS (METFORMIN HCL) 1 tab by mouth two times a day  #62 x 11   Entered and Authorized by:   Jamie Brookes MD   Signed by:   Jamie Brookes MD on 02/03/2010   Method used:   Electronically to        [facility] [phone]* (retail)       [address]       [place], [place]  [address]       Ph: [phone]       Fax: [phone]   RxID:   [id number] RELION BLOOD GLUCOSE TEST  STRP (GLUCOSE BLOOD) use as directed  #100 x 11   Entered and Authorized by:   Jamie Brookes MD   Signed by:   Jamie Brookes MD on 02/03/2010   Method used:   Electronically to        [facility] [phone]* (retail)       [address]       [place], [place]  [address]       Ph: [phone]       Fax: [phone]   RxID:   [id number] RELION ULTRA THIN LANCETS  MISC (LANCETS) use as directed  #100 x 11   Entered and Authorized by:   Jamie Brookes MD   Signed by:   Jamie Brookes MD on 02/03/2010   Method used:   Electronically to        [facility] [phone]* (retail)       [address]       [place], [place]  [address]       Ph: [phone]       Fax: [phone]   RxID:   [id number] FLUCONAZOLE 150 MG TABS (FLUCONAZOLE) take 1 pill at the first symptoms of vaginal infection. Take  another one in 4 days if no improvement.  #2 x 5   Entered and Authorized by:   Jamie Brookes MD   Signed by:   Jamie Brookes MD on 02/03/2010   Method used:   Electronically to        [facility] [phone]* (retail)       [address]       [place], [place]  [address]       Ph: [phone]       Fax: [phone]   RxID:   [id number] AMPHETAMINE-DEXTROAMPHETAMINE 5 MG TABS (AMPHETAMINE-DEXTROAMPHETAMINE) take 1 pill at breakfast and 1 at lunch.  #62 x 0   Entered and Authorized by:   Jamie Brookes MD   Signed by:   Jamie Brookes MD on 02/03/2010   Method used:   Handwritten   RxID:   [id number]   Laboratory Results   Blood Tests   Date/Time Received: February 03, 2010 11:27 AM  Date/Time Reported: February 03, 2010 12:04 PM   HGBA1C: 8.4%   (Normal Range: Non-Diabetic - 3-6%   Control Diabetic - 6-8%)  Comments: ...............test performed by......Marland KitchenBonnie A. [place]az[place], MLS (ASCP)cm      Prevention & Chronic Care Immunizations   Influenza vaccine: Not documented    Tetanus booster: 06/03/2002: Done.   Tetanus booster due: 06/03/2012    Pneumococcal vaccine: Not documented  Other Screening   Pap smear: NEGATIVE FOR INTRAEPITHELIAL LESIONS OR MALIGNANCY.  (02/18/2009)   Pap smear  due: 06/11/2009    Mammogram: ASSESSMENT: Negative - BI-RADS 1^MM DIGITAL SCREENING  (08/15/2009)  Reports requested:  Smoking status: never  (08/04/2009)  Diabetes Mellitus   HgbA1C: 8.4  (02/03/2010)   Hemoglobin A1C due: 09/11/2008    Eye exam: Not documented   Last eye exam report requested.   Eye exam due: 02/03/2011    Foot exam: yes  (04/13/2009)   High risk foot: Not documented   Foot care education: Not documented    Urine microalbumin/creatinine ratio: Not documented   Urine microalbumin/cr due: 11/23/2008    Diabetes flowsheet reviewed?: Yes   Progress toward A1C goal: Improved  Lipids   Total Cholesterol: 186  (02/18/2009)   LDL: 109   (02/18/2009)   LDL Direct: Not documented   HDL: 55  (02/18/2009)   Triglycerides: 111  (02/18/2009)  Hypertension   Last Blood Pressure: 128 / 78  (02/03/2010)   Serum creatinine: 0.76  (07/07/2009)   Serum potassium 4.7  (07/07/2009)    Hypertension flowsheet reviewed?: Yes   Progress toward BP goal: At goal  Self-Management Support :   Personal Goals (by the next clinic visit) :     Personal A1C goal: 6  (02/03/2010)     Personal blood pressure goal: 130/80  (02/03/2010)     Personal LDL goal: 70  (02/03/2010)    Patient will work on the following items until the next clinic visit to reach self-care goals:     Medications and monitoring: take my medicines every day, check my blood sugar  (02/03/2010)    Diabetes self-management support: Not documented    Hypertension self-management support: Not documented   Nursing Instructions: Request report of last diabetic eye exam

## 2010-10-03 NOTE — Assessment & Plan Note (Signed)
Summary: cpp wk   Vital Signs:  Patient Profile:   45 Years Old Female Height:     61.75 inches Weight:      152.8 pounds BMI:     28.28 Pulse rate:   72 / minute BP sitting:   121 / 84  (right arm)  Pt. in pain?   no  Vitals Entered By: Theresia Lo RN (March 05, 2007 9:49 AM)              Is Patient Diabetic? Yes    PCP:  Johney Maine MD  Chief Complaint:  adult cpe.  History of Present Illness: cc: PE HPI: 1)PE:  Doing well overall.  Has lost weight by eating less fried foods and sweets and more veggies.   2)DM: Admits sugars are usually in 3-400s only occasionally 200s and never in 100s.  Has been using Byetta and Metformin max dose.  Does not want to start insulin as she fears weight gain.  Has blurry vision on occasion.  Has been to ED with high sugars. complains of tingling in 3-4th toes on left foot and occasionally tingling/numbness in hand over past 2 months.    3)Depression:  Pt states she has been depressed b/c of high sugars and "a lot going on".  Denies suicidal ideation.  Is eating well and has interest in her normal activities.  Does not want meds, just wants to let me know 3)ADD: Says since stopping Straterra she has noticed herself having difficulty concentrating.  Has had some trouble w/ her job and wants to restart at a low dose.   4)Vaginal itching: has frequent BV. Says she now has external itching.  Not sexually active.  No burning.    Diabetes Management History:      The patient is a 45 years old female who comes in for evaluation of DM Type 2.  She has not been enrolled in the "Diabetic Education Program".  She states understanding of dietary principles but she is not following the appropriate diet.  Sensory loss is noted.  Self foot exams are not being performed.  She is checking home blood sugars.  She says that she is not exercising regularly.        Hyperglycemic symptoms include blurred vision.        Symptoms which suggest diabetic complications  include vision problems and paresthesias.  The following changes have been made to her treatment plan since last visit: medication changes.  Treatment plan changes were initiated by MD.       Past Medical History:    Reviewed history from 01/31/2007 and no changes required:       frequent episodes of bv       hx condyloma accuminata       hx endometriosis       hx SAB        thrombosed hemorrhoid 3/04   Family History:    Reviewed history from 10/31/2006 and no changes required:       diabetes--mom, dad, grandparents  Social History:    Reviewed history from 10/31/2006 and no changes required:       No smoking or alcohol.; Lives with husband, twin children and niece.   Risk Factors:  Tobacco use:  quit Exercise:  no   Review of Systems      See HPI   Physical Exam  General:     Well-developed,well-nourished,in no acute distress; alert,appropriate and cooperative throughout examination Head:  Normocephalic and atraumatic without obvious abnormalities. No apparent alopecia or balding. Eyes:     No corneal or conjunctival inflammation noted. EOMI. Perrla. Funduscopic exam benign, without hemorrhages, exudates or papilledema. Vision grossly normal. Mouth:     Oral mucosa and oropharynx without lesions or exudates.  Teeth in good repair. Neck:     No deformities, masses, or tenderness noted. Chest Wall:     No deformities, masses, or tenderness noted. Breasts:     No mass, nodules, thickening, tenderness, bulging, retraction, inflamation, nipple discharge or skin changes noted.   Lungs:     Normal respiratory effort, chest expands symmetrically. Lungs are clear to auscultation, no crackles or wheezes. Heart:     Normal rate and regular rhythm. S1 and S2 normal without gallop, murmur, click, rub or other extra sounds. Abdomen:     Bowel sounds positive,abdomen soft and non-tender without masses, organomegaly or hernias noted.  Pelvic Exam  Vulva:      normal  appearance.  No hair present Vagina:      normal.   Cervix:      friable.  ? brown discharge on cervix that comes off w/ swab.   Uterus:      smooth.   Adnexa:      normal, no masses bilaterally.       Impression & Recommendations:  Problem # 1:  ATTENTION DEFICIT, W/O HYPERACTIVITY (ICD-314.00) Assessment: Deteriorated Refilled STraterra but at lower dose 25mg  daily. May titrate up as needed  Problem # 2:  DIABETES-TYPE 2 (ICD-250.00) Assessment: Deteriorated Very poorly controlled despite max metformin and byetta.  Continues to refuse insulin.  Have discussed this w/ pt and Dr [name], Ilda Basset D in past.  Will try and add on Januvia and titrate up. Will have pt check sugars and look out for lows and symptoms of lows.  Will bring sugar diary to next visit.  If not much improvement, pt agrees that she will try insulin at next visit.  I believe her pancreas is no longer functioning and she really requires insulin.  Have discussed risks of not being on insulin now but pt wants to "try one more month".  Explained to pt that her tingling is likely seconary to DM and that it is sign of damage.  Orders: A1C-[facility] ([address])  Labs Reviewed: HgBA1c: 11.0 (03/05/2007)      Problem # 3:  VAGINAL DISCHARGE (ICD-623.5) Assessment: Unchanged wet prep negative.  No action needed.   Problem # 4:  DEPRESSION (ICD-311) Assessment: New Pt desires no meds for now.  Wants to get DM under control to decrease biggest stressor.  Will notify me if things get worse.  Other Orders: [facility] ([id number]) [id number]  Diabetes Management Assessment/Plan:      The following lipid goals have been established for the patient: Total cholesterol goal of 200; LDL cholesterol goal of 100; HDL cholesterol goal of 40; Triglyceride goal of 200.     Patient Instructions: 1)  Please schedule a follow-up appointment in 1 month. 2)  Take Januvia, Metformin, and Byetta as before        Laboratory  Results   Blood Tests   Date/Time Recieved: March 05, 2007 9:54 AM  Date/Time Reported: March 05, 2007 10:05 AM   HGBA1C: 11.0%   (Normal Range: Non-Diabetic - 3-6%   Control Diabetic - 6-8%)  Comments: ...................................................................DONNA [facility]  March 05, 2007 10:05 AM  Date/Time Received: March 05, 2007 10:32 AM  Date/Time Reported: March 05, 2007 10:37 AM   Wet Mount/KOH Source: vaginal WBC/hpf 5-10 Bacteria/hpf 2+  Rods Clue cells/hpf none  Negative whiff Yeast/hpf none Comments ...................................................................JEANNETTE RICHARDSON CMA,  March 05, 2007 10:37 AM    Laboratory Results   Blood Tests     HGBA1C: 11.0%   (Normal Range: Non-Diabetic - 3-6%   Control Diabetic - 6-8%)  CBC Comments: ...................................................................DONNA LORING  March 05, 2007 10:05 AM    Wet Mount/KOH  Rods  Negative whiff Comments ...................................................................Altamese Dilling CMA,  March 05, 2007 10:37 AM

## 2010-10-03 NOTE — Letter (Signed)
Summary: Out of Work  [facility]  [address]   [place], [place] [address]   Phone: [phone]  Fax: [phone]    August 04, 2009   Employee:  Tracy Weiss    To Whom It May Concern:   For Medical reasons, please excuse the above named employee from work for the following dates:  Start:   08-04-09  End:   08-07-09  If you need additional information, please feel free to contact our office.         Sincerely,    Jamie Brookes MD

## 2010-10-05 NOTE — Progress Notes (Addendum)
Summary: refill: adderall and sample of lantus  Phone Note Call from Patient Message from:  Patient  Caller: Patient Summary of Call: pt would like a refill for adderall xr. and she also would like a sample of lantus(flag sent to [facility] ). aslo she would like for her phone number to be updated will send to admin team.  Cell [phone], Home [phone] Initial call taken by: Loralee Pacas CMA,  September 28, 2010 2:58 PM  Follow-up for Phone Call        her Adderall Rx was put in the box out front yesterday and is ready for pick up. Please let her know.  Follow-up by: Jamie Brookes MD,  September 29, 2010 10:15 AM    patient in office to pick up RX . gave her one lantus pen sample [employer] lot # [id number] exp 07/14 Theresia Lo RN  September 29, 2010 4:08 PM

## 2010-10-05 NOTE — Progress Notes (Signed)
Summary: EMERGENCY LINE CALL  Phone Note Call from Patient Call back at [phone]   Caller: Patient Summary of Call: Pt calling emergency line with CBG of 290, she says she doesn't feel right.  She usually takes lantus 80 units two times a day but only took it once today because she ate a lot of vegetables.  She took 10 units of novolog approx 20 min before the CBG of 290.  I asked her to check her CBG now, she did and it was 280.  I advised she could take another 10 units novolog, and should take her lantus 80 two times a day as directed regardless of what she eats (as long as she is eating something).  Advised that since she missed a dose of lantus, she is 80 units of insulin deficient today and that an extra dose of novolog would be helpful but would unlikely drop her too low. Pt agrees and thankful amd will take lantus as directed from now on. Initial call taken by: Rodney Langton MD,  August 25, 2010 1:45 AM

## 2010-10-05 NOTE — Miscellaneous (Signed)
Summary: change methyphenedate to Adderall xr  Clinical Lists Changes  Pt was originally on Adderall. The pharamacy's in the area had a temporary shortage and she was switched to Methyphenidate during this time. She would like to go back on Adderall and use the long acting medicine so she doesn't have to dose it twice a day. Will give a 1 month trial of this medicine and if it works for her I can start writing 3 Rx's at a time. Pt is to call or come in to let her know who it's working.  Jamie Brookes MD  September 28, 2010 2:12 PM   Medications: Changed medication from METHYLPHENIDATE HCL 10 MG TABS (METHYLPHENIDATE HCL) take 1 tab in the morning and 1 in the evening. to ADDERALL XR 20 MG XR24H-CAP (AMPHETAMINE-DEXTROAMPHETAMINE) take 1 tablet every morning for ADHD - Signed Rx of ADDERALL XR 20 MG XR24H-CAP (AMPHETAMINE-DEXTROAMPHETAMINE) take 1 tablet every morning for ADHD;  #31 x 0;  Signed;  Entered by: Jamie Brookes MD;  Authorized by: Jamie Brookes MD;  Method used: Handwritten    Prescriptions: ADDERALL XR 20 MG XR24H-CAP (AMPHETAMINE-DEXTROAMPHETAMINE) take 1 tablet every morning for ADHD  #31 x 0   Entered and Authorized by:   Jamie Brookes MD   Signed by:   Jamie Brookes MD on 09/28/2010   Method used:   Handwritten   RxID:   [id number]

## 2010-10-06 ENCOUNTER — Telehealth: Payer: Self-pay | Admitting: Family Medicine

## 2010-10-09 ENCOUNTER — Encounter: Payer: Self-pay | Admitting: *Deleted

## 2010-10-09 ENCOUNTER — Telehealth: Payer: Self-pay | Admitting: *Deleted

## 2010-10-11 NOTE — Progress Notes (Signed)
----   Converted from flag ---- ---- 10/02/2010 4:27 PM, Jamie Brookes MD wrote: Please let the patient know she can pick up the new Rx. ------------------------------  LVM telling pt. that new rx is up front ready to be picked up

## 2010-10-11 NOTE — Progress Notes (Signed)
Summary: alternate rx  Phone Note Call from Patient Call back at [phone]   Summary of Call: pt wants to know if there is another rx besides the adderall XR? the rx she went to fill was $170, pt cannot afford it.  Initial call taken by: Knox Royalty,  October 02, 2010 3:46 PM  Follow-up for Phone Call        We can try the long acting concerta. If this does not work we will go back to regular dosing of either adderall or concerta and forget the extended release versions.  Follow-up by: Jamie Brookes MD,  October 02, 2010 4:24 PM    New/Updated Medications: METADATE ER 20 MG CR-TABS (METHYLPHENIDATE HCL) take 1 tablet every morning for ADHD Prescriptions: METADATE ER 20 MG CR-TABS (METHYLPHENIDATE HCL) take 1 tablet every morning for ADHD  #31 x 0   Entered and Authorized by:   Jamie Brookes MD   Signed by:   Jamie Brookes MD on 10/02/2010   Method used:   Handwritten   RxID:   [id number]

## 2010-10-11 NOTE — Progress Notes (Signed)
Summary: [facility] referral and [facility]  Phone Note Call from Patient   Caller: Patient Call For: [phone] Summary of Call: Need referral to Dental Clinic and have Mammogram.  Have Rudell Cobb card now. Initial call taken by: Abundio Miu,  October 06, 2010 11:20 AM  Follow-up for Phone Call        Will make sure this gets done. She should just need the phone number to the Breast Center. Will have WT give it.  Follow-up by: Jamie Brookes MD,  October 06, 2010 11:42 AM  New Problems: DENTAL EXAMINATION (ICD-V72.2)   New Problems: DENTAL EXAMINATION (ICD-V72.2)

## 2010-10-12 ENCOUNTER — Other Ambulatory Visit: Payer: Self-pay | Admitting: Family Medicine

## 2010-10-12 DIAGNOSIS — Z1231 Encounter for screening mammogram for malignant neoplasm of breast: Secondary | ICD-10-CM

## 2010-10-17 ENCOUNTER — Telehealth: Payer: Self-pay | Admitting: Family Medicine

## 2010-10-17 NOTE — Telephone Encounter (Signed)
Called pt and lvm for her to return call.Tracy Weiss [place]etta  

## 2010-10-19 NOTE — Letter (Signed)
Summary: Generic Letter  [facility]  [address]   [place], [place] [address]   Phone: [phone]  Fax: [phone]     Dear Tracy Weiss,  I have made several attempts to reach Tracy Weiss by phone concerning the information that you requested.  I am enclosing the card for the Breast center and you can call and set up an appointment to have a mammogram done.  Also I will send your information to [facility], just to make you aware we don not have any control over when the appointments for the dental clinic are scheduled and they have a limited number of slots each month for their services. Please be sure to stop by our office so that we can make a copy of your [facility] card.  Thank you for your time and attention to this matter.  Sincerely,   Tracy Weiss CMA enclosure

## 2010-10-19 NOTE — Progress Notes (Signed)
Summary: request for information  Phone Note Outgoing Call   Call placed by: Loralee Pacas CMA,  October 09, 2010 10:43 AM Summary of Call: called to give pt the information for the breast center that she was requesting but the mailbox was full. i will send her the information requested via mail Initial call taken by: Loralee Pacas CMA,  October 09, 2010 10:44 AM

## 2010-10-24 ENCOUNTER — Ambulatory Visit (HOSPITAL_COMMUNITY)
Admission: RE | Admit: 2010-10-24 | Discharge: 2010-10-24 | Disposition: A | Discharge status: Home / Self Care | Payer: Self-pay | Source: Ambulatory Visit | Attending: Family Medicine | Admitting: Family Medicine

## 2010-10-24 DIAGNOSIS — Z1231 Encounter for screening mammogram for malignant neoplasm of breast: Secondary | ICD-10-CM

## 2010-10-24 DIAGNOSIS — Z139 Encounter for screening, unspecified: Secondary | ICD-10-CM | POA: Insufficient documentation

## 2010-10-27 NOTE — Telephone Encounter (Signed)
Pt has not returned call after 2 attempts, if pt calls back, transfer to white team.

## 2010-11-02 ENCOUNTER — Telehealth: Payer: Self-pay | Admitting: Family Medicine

## 2010-11-02 NOTE — Telephone Encounter (Signed)
Has gotten letter from [facility] and they said it will be about 6-8 weeks before she can get her meds from there.  Needs lantus and novalog - she will be out tomorrow. Has an appt w/ strother next week.

## 2010-11-02 NOTE — Telephone Encounter (Signed)
Message left on voicemail that  She needs to bring in letter. Once she does that we will give her sample .

## 2010-11-03 NOTE — Telephone Encounter (Signed)
Patient brings in letter stating that she has appointment with [facility] on 11/08/2010. Advised patient when she gets her acceptance to let us know. Gave her one sample of Lantus pen [employer] lot # [id number] exp date 03/2013 and Novolog [employer]) lot # [id number] exp date 10/13.

## 2010-11-07 ENCOUNTER — Encounter: Payer: Self-pay | Admitting: Family Medicine

## 2010-11-07 ENCOUNTER — Ambulatory Visit (INDEPENDENT_AMBULATORY_CARE_PROVIDER_SITE_OTHER): Payer: Self-pay | Admitting: Family Medicine

## 2010-11-07 VITALS — BP 141/91 | HR 90 | Temp 98.6°F | Ht 62.0 in | Wt 181.9 lb

## 2010-11-07 DIAGNOSIS — N760 Acute vaginitis: Secondary | ICD-10-CM | POA: Insufficient documentation

## 2010-11-07 DIAGNOSIS — F988 Other specified behavioral and emotional disorders with onset usually occurring in childhood and adolescence: Secondary | ICD-10-CM

## 2010-11-07 DIAGNOSIS — B9689 Other specified bacterial agents as the cause of diseases classified elsewhere: Secondary | ICD-10-CM | POA: Insufficient documentation

## 2010-11-07 DIAGNOSIS — E119 Type 2 diabetes mellitus without complications: Secondary | ICD-10-CM

## 2010-11-07 DIAGNOSIS — E669 Obesity, unspecified: Secondary | ICD-10-CM

## 2010-11-07 DIAGNOSIS — I10 Essential (primary) hypertension: Secondary | ICD-10-CM

## 2010-11-07 HISTORY — DX: Other specified bacterial agents as the cause of diseases classified elsewhere: N76.0

## 2010-11-07 HISTORY — DX: Other specified bacterial agents as the cause of diseases classified elsewhere: B96.89

## 2010-11-07 LAB — POCT GLYCOSYLATED HEMOGLOBIN (HGB A1C): Hemoglobin A1C: 8

## 2010-11-07 MED ORDER — LOSARTAN POTASSIUM 50 MG PO TABS: 50.0000 mg | ORAL_TABLET | Freq: Every day | ORAL | Status: DC

## 2010-11-07 MED ORDER — FLUCONAZOLE 150 MG PO TABS: 150.0000 mg | ORAL_TABLET | Freq: Once | ORAL | Status: DC

## 2010-11-07 MED ORDER — INSULIN GLARGINE 100 UNIT/ML ~~LOC~~ SOLN: 80.0000 [IU] | Freq: Two times a day (BID) | SUBCUTANEOUS | Status: DC

## 2010-11-07 MED ORDER — METRONIDAZOLE 0.75 % VA GEL: 1.0000 | Freq: Two times a day (BID) | VAGINAL | Status: AC

## 2010-11-07 MED ORDER — HYDROCHLOROTHIAZIDE 25 MG PO TABS: 25.0000 mg | ORAL_TABLET | Freq: Every day | ORAL | Status: DC

## 2010-11-07 MED ORDER — ATOMOXETINE HCL 18 MG PO CAPS: 18.0000 mg | ORAL_CAPSULE | Freq: Every day | ORAL | Status: DC

## 2010-11-07 NOTE — Patient Instructions (Signed)
It was good to see you today. We updated your meds.

## 2010-11-10 ENCOUNTER — Telehealth: Payer: Self-pay | Admitting: Family Medicine

## 2010-11-10 NOTE — Telephone Encounter (Addendum)
Called back to # provided and person answering states I have a wrong # . Number dialed  verified correct. Will await call back from patient. All other numbers listed are out of service.

## 2010-11-10 NOTE — Telephone Encounter (Signed)
Message left for patient to return call.

## 2010-11-10 NOTE — Telephone Encounter (Signed)
[facility] states that it will take 4-6 weeks to get her meds and needs enough to last through march, It's for Methylphenid pls call when ready to pick up- needs today

## 2010-11-12 NOTE — Progress Notes (Signed)
Addended by: Jamie Brookes on: 11/12/2010 02:33 PM   Modules accepted: Orders

## 2010-11-12 NOTE — Assessment & Plan Note (Signed)
Pt has been out of her HCTZ and has been taking ADHD meds that may have caused some increase in her BP. Will refill HCTZ and start pt on Stratterra.

## 2010-11-12 NOTE — Assessment & Plan Note (Signed)
Pt's preference to change to [facility] which I approve of since it is better to have her off adderall and concerta.

## 2010-11-12 NOTE — Progress Notes (Signed)
Pt is here today to discuss several issues:  DM2: Pt is using Lantus 80 mg twice daily and Novolog 5-10 units with meals. Her A1c was 9.5 on 07-04-10 an is now 8.0. She says that her CBGs have been 75, 209, 95, 51, 63, 209 and 257 over the last few days.   ADHD: Pt is doing well on her ADHD meds but thinks that she would like to go back on Straterra because she did well with it in the past and she doesn't want to be on a medication for "kids" she wants to be on the "adult" ADHD medication.   HTN: Pt's BP is elevated today to 141/91. She has run out of her HCTZ and has not been taking it.   Weight: Pt continues to be unhappy with her weight. She was hoping that her ADHD meds would help with both school and weight loss but she has not lost weight with them. She is not exercising like she was previously. She has some joint aching.   Joint aches: Pt has not been taking her HCTZ med for HTN and today comes in with joints that are aching and feel like they are swollen. Discussed her not taking her diuretic and how this could contribute to her feeling like she has increased swelling everywhere.   Chronic Vaginitis: Pt has frequent vaginitis infection and keeps metronidazole at home to treat these infections. She does need a refill today.   ROS: Neg except as noted in HPI, neg fever, chills and weight loss.   PE:  GEN:NAD, pt it active and appears happy.  HEENT: [place]/AT, EOMI, PERRL, no external ear deformities, no external nasal deformities CV: RRR, no murmurs Pulm: CTAB, no wheezes.  Ext: no signs of edema.

## 2010-11-12 NOTE — Assessment & Plan Note (Signed)
Pt has chronic recurring vaginitis infections. Rx for Metronidazole gel refilled.

## 2010-11-12 NOTE — Assessment & Plan Note (Signed)
meds refilled, A1c is 8.0 which is improved since last A1c of 9.5 Cont Lantus and Novolog with meals.  Encouraged weight loss to help with diabetes

## 2010-11-12 NOTE — Assessment & Plan Note (Signed)
Pt feels like she is gaining weight despite being on some ADHD meds that might have caused weight loss. She wonders if it is all swelling. It is likely that she is retaining some fluid since she usually is taking HCTZ and has been out. Will restart this medication and encourage exercise for weight loss.

## 2010-11-13 NOTE — Telephone Encounter (Signed)
Spoke with patient and she needs a full months prescription written for methyphenidate .   Will send message to Dr. Clotilde Dieter.

## 2010-11-13 NOTE — Telephone Encounter (Signed)
[phone] - pls call back

## 2010-11-14 ENCOUNTER — Other Ambulatory Visit: Payer: Self-pay | Admitting: Family Medicine

## 2010-11-14 DIAGNOSIS — I10 Essential (primary) hypertension: Secondary | ICD-10-CM

## 2010-11-14 DIAGNOSIS — IMO0002 Reserved for concepts with insufficient information to code with codable children: Secondary | ICD-10-CM

## 2010-11-14 DIAGNOSIS — F988 Other specified behavioral and emotional disorders with onset usually occurring in childhood and adolescence: Secondary | ICD-10-CM

## 2010-11-14 DIAGNOSIS — E669 Obesity, unspecified: Secondary | ICD-10-CM

## 2010-11-14 DIAGNOSIS — E1165 Type 2 diabetes mellitus with hyperglycemia: Secondary | ICD-10-CM

## 2010-11-14 MED ORDER — INSULIN ASPART 100 UNIT/ML ~~LOC~~ SOLN: 10.0000 [IU] | Freq: Three times a day (TID) | SUBCUTANEOUS | Status: DC

## 2010-11-14 MED ORDER — OLMESARTAN MEDOXOMIL-HCTZ 40-25 MG PO TABS: 1.0000 | ORAL_TABLET | Freq: Every day | ORAL | Status: DC

## 2010-11-14 MED ORDER — METHYLPHENIDATE HCL 10 MG PO TABS: 10.0000 mg | ORAL_TABLET | Freq: Two times a day (BID) | ORAL | Status: DC

## 2010-11-14 NOTE — Telephone Encounter (Signed)
Informed patient

## 2010-11-14 NOTE — Telephone Encounter (Signed)
Please tell the patient the prescription will be waiting at the front desk. Also, let her know that I have to change her Cozaar to Benicar HCT to combine both her Cozaar and HCTZ into 1 similar med that they cover.  Tell her to check her BP frequently to make sure she is not going low over the next moth or so. If she gets dizzy to drop back to a half a pill.  Also, let her know that I will also send in a Rx for the Novolog.  Thanks.

## 2010-11-26 LAB — GLUCOSE, CAPILLARY: Glucose-Capillary: 311 mg/dL — ABNORMAL HIGH (ref 70–99)

## 2010-11-29 ENCOUNTER — Telehealth: Payer: Self-pay | Admitting: Family Medicine

## 2010-11-29 NOTE — Telephone Encounter (Signed)
Has not heard about referral to diabetic education.  Needs to know when it is.

## 2010-11-30 NOTE — Telephone Encounter (Signed)
Will re-fax referral.

## 2010-12-08 LAB — RAPID STREP SCREEN (MED CTR MEBANE ONLY): Streptococcus, Group A Screen (Direct): NEGATIVE

## 2010-12-09 LAB — DIFFERENTIAL
Basophils Absolute: 0.3 10*3/uL — ABNORMAL HIGH (ref 0.0–0.1)
Basophils Relative: 4 % — ABNORMAL HIGH (ref 0–1)
Eosinophils Absolute: 0.1 10*3/uL (ref 0.0–0.7)
Eosinophils Relative: 1 % (ref 0–5)
Lymphocytes Relative: 37 % (ref 12–46)
Lymphs Abs: 2.6 10*3/uL (ref 0.7–4.0)
Monocytes Absolute: 0.4 10*3/uL (ref 0.1–1.0)
Monocytes Relative: 6 % (ref 3–12)
Neutro Abs: 3.7 10*3/uL (ref 1.7–7.7)
Neutrophils Relative %: 53 % (ref 43–77)

## 2010-12-09 LAB — URINALYSIS, ROUTINE W REFLEX MICROSCOPIC
Bilirubin Urine: NEGATIVE
Glucose, UA: NEGATIVE mg/dL
Hgb urine dipstick: NEGATIVE
Ketones, ur: NEGATIVE mg/dL
Nitrite: NEGATIVE
Protein, ur: NEGATIVE mg/dL
Specific Gravity, Urine: 1.02 (ref 1.005–1.030)
Urobilinogen, UA: 0.2 mg/dL (ref 0.0–1.0)
pH: 6 (ref 5.0–8.0)

## 2010-12-09 LAB — CBC
HCT: 35.3 % — ABNORMAL LOW (ref 36.0–46.0)
Hemoglobin: 12 g/dL (ref 12.0–15.0)
MCHC: 34.1 g/dL (ref 30.0–36.0)
MCV: 88.7 fL (ref 78.0–100.0)
Platelets: 332 10*3/uL (ref 150–400)
RBC: 3.98 MIL/uL (ref 3.87–5.11)
RDW: 13.5 % (ref 11.5–15.5)
WBC: 7.2 10*3/uL (ref 4.0–10.5)

## 2010-12-09 LAB — POCT PREGNANCY, URINE: Preg Test, Ur: NEGATIVE

## 2010-12-09 LAB — GLUCOSE, CAPILLARY: Glucose-Capillary: 124 mg/dL — ABNORMAL HIGH (ref 70–99)

## 2010-12-29 ENCOUNTER — Encounter: Payer: Self-pay | Admitting: Family Medicine

## 2010-12-29 ENCOUNTER — Ambulatory Visit (INDEPENDENT_AMBULATORY_CARE_PROVIDER_SITE_OTHER): Payer: Self-pay | Admitting: Family Medicine

## 2010-12-29 VITALS — BP 130/89 | HR 86 | Temp 98.2°F | Ht 61.0 in | Wt 183.0 lb

## 2010-12-29 DIAGNOSIS — K589 Irritable bowel syndrome without diarrhea: Secondary | ICD-10-CM | POA: Insufficient documentation

## 2010-12-29 DIAGNOSIS — E119 Type 2 diabetes mellitus without complications: Secondary | ICD-10-CM

## 2010-12-29 NOTE — Assessment & Plan Note (Addendum)
Last A1c was 8.0 in March. Next A1c due is in June. Pt has her meds intermittently from the [facility]. She is using them when they are available and trying to eat right and exercise when they don't have her meds.  Plan to see her back in 3rd week of June to retest her A1c.

## 2010-12-29 NOTE — Assessment & Plan Note (Addendum)
Pt has had years of IBS, was evaluated by a GI doctor in the past > 15 years ago but does not remember the name. She has a family history of metastatic ca (unknown origin) and her sister has to have colonoscopies yearly (she's not sure why). She is concerned about her risk of colon cancer. Plan to order screening colonoscopy since she has a family h/o colon problems.

## 2010-12-29 NOTE — Patient Instructions (Signed)
I sent in a referral to the GI doctor for a colonoscopy.  I resent a message about your diabetes classes.  I will need to see you the end of June for a diabetes class.

## 2010-12-31 NOTE — Progress Notes (Signed)
GI/IBS: Pt has a personal h/o years of constipation dominant IBS, was evaluated by a GI doctor in the past > 15 years ago but does not remember the name. She takes something (Super Colon Cleanse) to help her stool about every other day. If she does not take something then she does not stool. She feels like she is bloated daily. She has not seen any blood in the stool, has a pos h/o hemorroids, some painful stools occasionally. She has a family history of metastatic ca (unknown origin) and her sister has to have colonoscopies yearly since age 89 (she's not sure why). She is concerned about her risk of colon cancer. She wants to discuss getting a colonoscopy.   Diabetes: Last A1c was 8.0 in March. Next A1c due is in June. Pt has her meds intermittently from the [facility]. She is using them when they are available and trying to eat right and exercise when they don't have her meds. She is walking and riding her bike for exercise. She is not checking her pm CBG's as often but is checking the am regularly and says the are usually less than 120 if taking meds.   ROS: neg except as noted above in HPI.   PE:  Gen: no acute distress CV: RRR, no murmur Pulm: CTAB, no wheezes.  Abd: no tenderness, no distention, hypoactive BS, overweight but not obese.  Est:  no edema

## 2011-01-19 NOTE — Discharge Summary (Signed)
   NAME:  Tracy Weiss, Tracy Weiss                           ACCOUNT NO.:  [id number]   MEDICAL RECORD NO.:  [id number]                   PATIENT TYPE:  INP   LOCATION:  5039                                 FACILITY:  [facility]   PHYSICIAN:  Leighton Roach McDiarmid, M.D.             DATE OF BIRTH:  1965/11/27   DATE OF ADMISSION:  07/08/2003  DATE OF DISCHARGE:  07/09/2003                                 DISCHARGE SUMMARY   DISCHARGE DIAGNOSES:  1. Syncope.  2. Pleuritic chest pain, resolved.  3. Diabetes mellitus, type 2.   DISCHARGE MEDICATIONS:  1. Glucophage 500 mg p.o. b.i.d.  2. Sucaryl 80 mg p.o. daily  3. Zelnorm 6 mg p.o. b.i.d.   HISTORY:  Tracy Weiss is a 45 year old who presented with a syncopal episode  and pleuritic chest pain.   HOSPITAL COURSE:  The patient was brought into the hospital for evaluation  and monitoring.  Overnight she remained on telemetry without any episodes.  The plan was to have a 2-D echocardiogram performed the following morning.  After admission, the patient had no further chest pain or difficulty  ambulating and no dizziness.  The patient did admit to social stressor being  possibility of her chest pain which could have caused her to become  vasovagal and have her syncopal episode.  It was decided to perform 2-D  echocardiogram as outpatient at [facility].  If she does develop  palpitations in the future, she will need Holter monitor.   Diabetes mellitus:  The patient received a flu shot prior to discharge.  She  was continued on Glucophage, and her sugars remained stable.   CONDITION ON DISCHARGE:  The patient was discharged to home in stable  condition.   DISCHARGE INSTRUCTIONS:  1. She was told to continue her medical regimen.  2. She has followup appointment with her primary physician as well as     evaluation by echocardiogram.      Maurice March, M.D.                        Tracy Weiss, M.D.    LC/MEDQ  D:  07/09/2003  T:  07/10/2003   Job:  [id number]

## 2011-01-19 NOTE — H&P (Signed)
NAME:  Tracy Weiss, Tracy Weiss                           ACCOUNT NO.:  [id number]   MEDICAL RECORD NO.:  [id number]                   PATIENT TYPE:  INP   LOCATION:  1843                                 FACILITY:  [facility]   PHYSICIAN:  Ursula Beath, MD               DATE OF BIRTH:  1965-12-26   DATE OF ADMISSION:  07/08/2003  DATE OF DISCHARGE:                                HISTORY & PHYSICAL   CHIEF COMPLAINT:  Syncope.   HISTORY OF PRESENT ILLNESS:  Today, the patient was dizzy, clouded, and  went to work.  She walked over to see her employer when she felt worsening  dizziness and apparently lost consciousness. Coworkers state she did not  respond for 15 minutes, but did not specify whether she spontaneously opened  her eyes or  had movement during that 15 minutes. She recalled where she was  after the incident. No mental status changes.  Able to ambulate without  incident. No bowel or bladder incontinence. No seizure activity.  She was  diaphoretic with incident. Of note, she has also had substernal chest pains  for the past three days. Initially it was 5/10 and today prior to the  incident it was 8 or 9/10 and sharp radiating to her back, and increasing  with bending forward and breathing.   REVIEW OF SYSTEMS:  Denies fevers. Did have a bitemporal headache the day  prior to admission.  CARDIOVASCULAR:  Positive for chest pain. GI: No  vomiting, diarrhea, or constipation. Positive for nausea. NEUROLOGIC: No  weakness or numbness. MUSCULOSKELETAL: None noted.  ENT:  No URI symptoms.  EYES: No vision changes.  GU: No dysuria. RESPIRATORY: Positive for  pleuritic chest pain and some dyspnea, but no cough.  SKIN: No rash.  PSYCHIATRIC: No anxiety, nervousness, or depression.  ENDOCRINE: Her CBGs  have been no greater than 160.   PAST MEDICAL HISTORY:  1. Type 2 diabetes mellitus.  2. Attention deficit disorder, without hyperactivity.  3. Irritable bowel syndrome.  4.  Endometriosis.   PAST SURGICAL HISTORY:  1. Removal of thrombosed hemorrhoid.  2. Cesarean section.  3. Left groin mass excision.  4. Bilateral tubal ligation.   MEDICATIONS:  1. Glucophage 500 mg b.i.d.  2. Strattera 80 mg daily.  3. Zelnorm 6 mg b.i.d. Her stools have been soft since starting this     medication.   ALLERGIES:  No known drug allergies.   SOCIAL HISTORY:  She lives with her husband, twin children, and niece. 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 She denies alcohol or tobacco  use.   PHYSICAL EXAMINATION:  VITAL SIGNS: Temperature 97.9, blood pressure 153/99,  heart rate 84, respirations 20.  Saturations 99% on room air.  GENERAL: She is alert, somewhat less talkative than usual, appropriate, and  in no acute distress. Judgment and insight appear to be intact.  Mental  status also  appears to be intact.  HEENT:  Pupils equal, round, and reactive to light. Extraocular motions  intact. Conjunctivae clear. Oropharynx is without erythema or exudate.  Check for mild tenderness over the center of his forehead.  NECK: She has no neck masses. Her thyroid is without enlargement or  tenderness. She has no lymphadenopathy.  RESPIRATORY: Decreased breath sounds secondary to splinting bilaterally, but  her lungs are clear.  CARDIOVASCULAR: Regular rate and rhythm with no murmur, rub, or gallop.  No  cardiomegaly is noted on exam. EXTREMITIES: No clubbing or cyanosis. No  edema.  GI: Her abdomen is soft, nontender, nondistended with good bowel sounds.  No  hepatosplenomegaly.  SKIN: No rash noted.  NEUROLOGIC: Cranial nerves II-XII intact grossly.  DTRs reveal 0-1/2 patella  bilaterally and biceps bilaterally. Normal finger-to-nose, normal heel-to-  shin. Strength is 5/5 in the upper and lower extremities bilaterally. She  has a normal gait. Monofilament is normal.   Laboratories collected at [facility] emergency department  revealed Chem-7 with  sodium of 138, potassium 3.4, chloride 106, bicarbonate  28, BUN 3, creatinine 0.6, glucose 120, calcium 8.7. CBC reveals white count  of 5.5, hemoglobin 11.8, platelet count 327,000, MCV 89%.  Liver enzymes are  within normal limits. CK 99, MB 1.5, troponin 0.01. Urine pregnancy is  negative. UA is within normal limits.   ASSESSMENT/PLAN:  This is a 45 year old female with syncopal episode and  pleuritic chest pain.  1. Syncope. Differential diagnosis includes vasovagal reaction, dehydration,     and most likely neurologic event or cardiac event. She has had some risk     factors for cardiac disease, but her diabetes is extremely well     controlled. Last noted clinic hemoglobin A1C was 5.9 and she also has a     nonfocal exam.  You should also consider pericarditis as a possible in     sighting event with the pain causing a vagal reaction. We plan for an EKG     and chest x-ray, check a 2-D echocardiogram in the morning, and will plan     InFeD therapy if it is found to be pericarditis.  2. Diabetes mellitus.  Continue Glucophage.  3. Irritable bowel syndrome. Continue Zelnorm.  4. Blood pressure is elevated, but no history. Will monitor this during her     hospitalization.                                                Ursula Beath, MD    JT/MEDQ  D:  07/08/2003  T:  07/08/2003  Job:  [id number]

## 2011-01-23 ENCOUNTER — Encounter: Payer: Self-pay | Admitting: Family Medicine

## 2011-01-23 ENCOUNTER — Ambulatory Visit (INDEPENDENT_AMBULATORY_CARE_PROVIDER_SITE_OTHER): Payer: Self-pay | Admitting: Family Medicine

## 2011-01-23 VITALS — BP 121/83 | HR 101 | Temp 97.1°F | Ht 61.0 in | Wt 186.2 lb

## 2011-01-23 DIAGNOSIS — R259 Unspecified abnormal involuntary movements: Secondary | ICD-10-CM

## 2011-01-23 DIAGNOSIS — R253 Fasciculation: Secondary | ICD-10-CM

## 2011-01-23 LAB — BASIC METABOLIC PANEL
BUN: 8 mg/dL (ref 6–23)
CO2: 25 mEq/L (ref 19–32)
Calcium: 10 mg/dL (ref 8.4–10.5)
Chloride: 100 mEq/L (ref 96–112)
Creat: 0.78 mg/dL (ref 0.40–1.20)
Glucose, Bld: 108 mg/dL — ABNORMAL HIGH (ref 70–99)
Potassium: 3.7 mEq/L (ref 3.5–5.3)
Sodium: 138 mEq/L (ref 135–145)

## 2011-01-23 NOTE — Patient Instructions (Signed)
We are going to check you electrolytes for the reason for your twitches.  I do not think they are anything to be concerned about and don't think we should get any imaging today.  I will put in a referral to neurology and if they feel imaging is needed, they will order the proper imaging.

## 2011-01-23 NOTE — Assessment & Plan Note (Signed)
Pt has some twitching sensations in her Rt parietal region on the outside of her skull. She is concerned that it is something serious. It has been going on for 2 weeks. I reassured her that imaging is not necessary but since it is waking her up at night i have agreed to do a neurology referral.

## 2011-01-24 ENCOUNTER — Encounter: Payer: Self-pay | Admitting: Family Medicine

## 2011-01-31 NOTE — Progress Notes (Signed)
Rt head fasiculations: Pt has some twitching sensations in her Rt parietal region on the outside of her skull. She is concerned that it is something serious. It has been going on for 2 weeks. She reports having a headache for 2 days. Took Aleve and got better. She says that the twitching in her head wakes her up out of sleep sometimes. "feels like something is running in it". No pain. Never had anything like this before.   ROS: neg except as noted above.  PE:  Gen: seated comfortably.  HEENT: [place]/AT, pt has on hairpeice that can not be removed. No noted abnormalities in her head. No eye abnormalities.

## 2011-02-02 ENCOUNTER — Other Ambulatory Visit: Payer: Self-pay | Admitting: Family Medicine

## 2011-02-02 DIAGNOSIS — N760 Acute vaginitis: Secondary | ICD-10-CM

## 2011-02-02 MED ORDER — FLUCONAZOLE 150 MG PO TABS: 150.0000 mg | ORAL_TABLET | Freq: Once | ORAL | Status: DC

## 2011-02-16 ENCOUNTER — Encounter: Payer: Self-pay | Attending: Family Medicine | Admitting: Dietician

## 2011-02-16 DIAGNOSIS — E119 Type 2 diabetes mellitus without complications: Secondary | ICD-10-CM | POA: Insufficient documentation

## 2011-02-16 DIAGNOSIS — Z713 Dietary counseling and surveillance: Secondary | ICD-10-CM | POA: Insufficient documentation

## 2011-02-22 ENCOUNTER — Ambulatory Visit: Payer: Self-pay | Admitting: Family Medicine

## 2011-03-26 ENCOUNTER — Encounter: Payer: Self-pay | Admitting: Family Medicine

## 2011-03-26 ENCOUNTER — Ambulatory Visit (INDEPENDENT_AMBULATORY_CARE_PROVIDER_SITE_OTHER): Payer: Self-pay | Admitting: Family Medicine

## 2011-03-26 VITALS — BP 127/81 | HR 90 | Temp 98.3°F | Ht 61.0 in | Wt 186.6 lb

## 2011-03-26 DIAGNOSIS — R202 Paresthesia of skin: Secondary | ICD-10-CM | POA: Insufficient documentation

## 2011-03-26 DIAGNOSIS — R200 Anesthesia of skin: Secondary | ICD-10-CM

## 2011-03-26 DIAGNOSIS — E119 Type 2 diabetes mellitus without complications: Secondary | ICD-10-CM

## 2011-03-26 DIAGNOSIS — R209 Unspecified disturbances of skin sensation: Secondary | ICD-10-CM

## 2011-03-26 HISTORY — DX: Anesthesia of skin: R20.2

## 2011-03-26 HISTORY — DX: Anesthesia of skin: R20.0
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
## 2011-03-26 NOTE — Patient Instructions (Signed)
I've put in the referral for podiatry for your annual foot exam. Someone from our office or their office will give you a call. Try wearing tennis shoes to help with the swelling and numbness. If you have any questions don't hesitate to call.

## 2011-03-27 ENCOUNTER — Telehealth: Payer: Self-pay | Admitting: Family Medicine

## 2011-03-27 NOTE — Telephone Encounter (Signed)
lvm to inform pt that because she has [place] that her referrals cannot be complete at this time. Neurology she will have to go to either [facility] and will have to pay a few hundred dollars and then set up a payment plan. If this is something that she is interested in doing to call our office back and we can begin setting up that appt for her.Marland KitchenMarland KitchenLoralee Pacas [place]

## 2011-03-27 NOTE — Telephone Encounter (Signed)
Would like a referral to a Neurologist as well as a Podiatrist.  Will call back in 2 days to check the status of these referrals.

## 2011-03-28 ENCOUNTER — Telehealth: Payer: Self-pay | Admitting: Family Medicine
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
## 2011-03-31 ENCOUNTER — Encounter: Payer: Self-pay | Admitting: Family Medicine

## 2011-04-16 ENCOUNTER — Emergency Department (HOSPITAL_COMMUNITY): Payer: Self-pay

## 2011-04-16 ENCOUNTER — Emergency Department (HOSPITAL_COMMUNITY)
Admission: EM | Admit: 2011-04-16 | Discharge: 2011-04-16 | Disposition: A | Discharge status: Home / Self Care | Payer: Self-pay | Attending: Emergency Medicine | Admitting: Emergency Medicine

## 2011-04-16 DIAGNOSIS — E119 Type 2 diabetes mellitus without complications: Secondary | ICD-10-CM | POA: Insufficient documentation

## 2011-04-16 DIAGNOSIS — R609 Edema, unspecified: Secondary | ICD-10-CM | POA: Insufficient documentation

## 2011-04-16 DIAGNOSIS — R079 Chest pain, unspecified: Secondary | ICD-10-CM | POA: Insufficient documentation

## 2011-04-16 LAB — CBC
HCT: 33.4 % — ABNORMAL LOW (ref 36.0–46.0)
Hemoglobin: 11.1 g/dL — ABNORMAL LOW (ref 12.0–15.0)
MCH: 29.1 pg (ref 26.0–34.0)
MCHC: 33.2 g/dL (ref 30.0–36.0)
MCV: 87.7 fL (ref 78.0–100.0)
Platelets: 348 10*3/uL (ref 150–400)
RBC: 3.81 MIL/uL — ABNORMAL LOW (ref 3.87–5.11)
RDW: 13.5 % (ref 11.5–15.5)
WBC: 5 10*3/uL (ref 4.0–10.5)

## 2011-04-16 LAB — POCT I-STAT, CHEM 8
BUN: 8 mg/dL (ref 6–23)
Calcium, Ion: 1.23 mmol/L (ref 1.12–1.32)
Chloride: 103 mEq/L (ref 96–112)
Creatinine, Ser: 0.8 mg/dL (ref 0.50–1.10)
Glucose, Bld: 109 mg/dL — ABNORMAL HIGH (ref 70–99)
HCT: 36 % (ref 36.0–46.0)
Hemoglobin: 12.2 g/dL (ref 12.0–15.0)
Potassium: 3.9 mEq/L (ref 3.5–5.1)
Sodium: 141 mEq/L (ref 135–145)
TCO2: 26 mmol/L (ref 0–100)

## 2011-04-16 LAB — DIFFERENTIAL
Basophils Absolute: 0 10*3/uL (ref 0.0–0.1)
Basophils Relative: 1 % (ref 0–1)
Eosinophils Absolute: 0.1 10*3/uL (ref 0.0–0.7)
Eosinophils Relative: 1 % (ref 0–5)
Lymphocytes Relative: 46 % (ref 12–46)
Lymphs Abs: 2.3 10*3/uL (ref 0.7–4.0)
Monocytes Absolute: 0.4 10*3/uL (ref 0.1–1.0)
Monocytes Relative: 7 % (ref 3–12)
Neutro Abs: 2.3 10*3/uL (ref 1.7–7.7)
Neutrophils Relative %: 46 % (ref 43–77)

## 2011-04-16 LAB — GLUCOSE, CAPILLARY: Glucose-Capillary: 112 mg/dL — ABNORMAL HIGH (ref 70–99)

## 2011-04-16 LAB — POCT I-STAT TROPONIN I: Troponin i, poc: 0 ng/mL (ref 0.00–0.08)

## 2011-04-17 ENCOUNTER — Ambulatory Visit: Payer: Self-pay | Admitting: Dietician

## 2011-04-18 ENCOUNTER — Ambulatory Visit: Payer: Self-pay | Admitting: Family Medicine

## 2011-04-30 ENCOUNTER — Encounter: Payer: Self-pay | Admitting: Dietician

## 2011-05-11 ENCOUNTER — Ambulatory Visit: Payer: Self-pay | Admitting: Family Medicine

## 2011-05-15 ENCOUNTER — Other Ambulatory Visit: Payer: Self-pay | Admitting: Family Medicine

## 2011-05-15 DIAGNOSIS — E119 Type 2 diabetes mellitus without complications: Secondary | ICD-10-CM

## 2011-05-15 MED ORDER — INSULIN GLARGINE 100 UNIT/ML ~~LOC~~ SOLN: 80.0000 [IU] | Freq: Two times a day (BID) | SUBCUTANEOUS | Status: DC

## 2011-06-17 ENCOUNTER — Telehealth: Payer: Self-pay | Admitting: Family Medicine

## 2011-06-17 NOTE — Telephone Encounter (Signed)
Pt reports cold symptoms x 3 weeks.  Not seeming to get better.  Cough- dry cough.  Good fluid intake. Decreased po intake.  Had fever approx 3-4 days ago.  Pt encouraged to call in the morning for work in appointment.  Pt states understanding.

## 2011-06-19 ENCOUNTER — Ambulatory Visit (INDEPENDENT_AMBULATORY_CARE_PROVIDER_SITE_OTHER): Payer: Self-pay | Admitting: Family Medicine

## 2011-06-19 ENCOUNTER — Encounter: Payer: Self-pay | Admitting: Family Medicine

## 2011-06-19 VITALS — BP 131/85 | HR 94 | Ht 61.0 in | Wt 189.9 lb

## 2011-06-19 DIAGNOSIS — J189 Pneumonia, unspecified organism: Secondary | ICD-10-CM

## 2011-06-19 DIAGNOSIS — F988 Other specified behavioral and emotional disorders with onset usually occurring in childhood and adolescence: Secondary | ICD-10-CM

## 2011-06-19 MED ORDER — METHYLPHENIDATE HCL 10 MG PO TABS: 10.0000 mg | ORAL_TABLET | Freq: Two times a day (BID) | ORAL | Status: DC

## 2011-06-19 MED ORDER — DOXYCYCLINE HYCLATE 100 MG PO TABS: 100.0000 mg | ORAL_TABLET | Freq: Two times a day (BID) | ORAL | Status: AC

## 2011-06-19 MED ORDER — DOXYCYCLINE HYCLATE 100 MG PO TABS: 100.0000 mg | ORAL_TABLET | Freq: Two times a day (BID) | ORAL | Status: DC

## 2011-06-19 NOTE — Patient Instructions (Signed)
Pneumonia Pneumonia is an infection of the lungs. It may be caused by a bacteria or virus. Most forms are bacterial. Usually, these infections are caused by breathing infectious particles into the lungs (respiratory tract). SYMPTOMS   The most common problems (symptoms) are:    Cough.   Fever.    Chest pain.   Increased rate of breathing.     Wheezing.   Mucus production.     DIAGNOSIS   Often these infections are diagnosed on exam by your caregiver. Sometimes the diagnosis may require:    Chest X-rays.   Blood analysis.   Cultures. Blood cultures may be done to help find the cause of your pneumonia.  Your caregiver may do tests (blood gasses or pulse oximetry) to see how well your lungs are working. TREATMENT The bacterial pneumonias generally respond well to medicines (antibiotics) that kill germs. Viral infections must run their course. These infections will not respond to antibiotics. A pneumococcal shot (vaccine) is available to prevent a common bacterial pneumonia. This is usually suggested for the elderly and for other groups of higher risk individuals, such as those on chemotherapy or those who have problems with their immune system.  You will have pneumococcal screening or vaccination if you are over 65 years old and are not immunized.   If you are a smoker, it is time to quit. You may receive instructions on how to best stop smoking. Your caregiver can provide medicines and counseling to help you quit.  HOME CARE INSTRUCTIONS  Cough suppressants may be used if you are losing too much rest. However, coughing protects you by clearing your lungs. This is one reason for not using cough suppressants, if able, as they take away this protection.   Your caregiver may have prescribed an antibiotic if she or he feels your cough is caused by a bacterial infection. Take all your medicine until you are finished.   Your caregiver may also prescribe an expectorant to loosen the mucus to  be coughed up.   Only take over-the-counter or prescription medicines for pain, discomfort, or fever as directed by your caregiver.   Smoking is a common cause of bronchitis and can contribute to pneumonia. Stopping this habit is an important self-help step.   If you are a smoker and continue to smoke, your cough may last several weeks after your pneumonia has cleared.   A cold steam vaporizer or humidifier in your room or home may help loosen mucus.   Coughing is often worse at night. Sleeping in a semi-upright position in a recliner or using a couple pillows under your head will help with this.   Get rest as you feel it is needed. Your body will usually let you know when to rest.  SEEK IMMEDIATE MEDICAL CARE IF:  You develop pus-like mucus (sputum) or your illness becomes worse. This is especially true if you are elderly or weakened from any other disease.   You cannot control your cough with suppressants and are losing sleep.   You begin coughing up blood.   You develop pain which is getting worse or is uncontrolled with medicines.   You or your child has an oral temperature above 100.4, not controlled by medicine.   Any of the symptoms which initially brought you in for treatment are getting worse rather than better.   You develop shortness of breath or chest pain.  MAKE SURE YOU:    Understand these instructions.   Will watch your condition.     Will get help right away if you are not doing well or get worse.  Document Released: 08/20/2005 Document Re-Released: 02/07/2010 [employer] Patient Information 2011 [employer]. 

## 2011-06-19 NOTE — Progress Notes (Signed)
  Subjective:     Tracy Weiss is a 45 y.o. female who presents for evaluation of symptoms of a URI. Symptoms include fever,  cough described as productive of yellow sputum and harsh. Onset of symptoms was 1 month ago, and has been gradually worsening since that time. Treatment to date: antihistamines, cough suppressants and decongestants. She originally had congestion and sore throat with post-nasal drip. Now it is in her chest. She has loss of voice. No contact with children or hospital/nursing home.  The following portions of the patient's history were reviewed and updated as appropriate: allergies, current medications, past family history, past medical history, past social history, past surgical history and problem list.  Review of Systems Constitutional: positive for chills and fevers, negative for night sweats and weight loss Respiratory: positive for cough, pleurisy/chest pain and sputum, negative for asthma, chronic bronchitis, hemoptysis, stridor and wheezing Cardiovascular: negative for chest pain, irregular heart beat, lower extremity edema and near-syncope Gastrointestinal: positive for none, negative for constipation, diarrhea, melena and nausea Genitourinary:negative   Objective:    BP 131/85  Pulse 94  Ht 5\' 1"  (1.549 m)  Wt 189 lb 14.4 oz (86.138 kg)  BMI 35.88 kg/m2 General appearance: alert and cooperative Head: Normocephalic, without obvious abnormality, atraumatic, sinuses nontender to percussion Nose: Nares normal. Septum midline. Mucosa normal. No drainage or sinus tenderness. Throat: lips, mucosa, and tongue normal; teeth and gums normal Neck: no adenopathy, no carotid bruit, no JVD, supple, symmetrical, trachea midline and thyroid not enlarged, symmetric, no tenderness/mass/nodules Lungs: rales LLL Heart: regular rate and rhythm, S1, S2 normal, no murmur, click, rub or gallop   Assessment:    Community acquired Pneumonia  Plan:    discussed option to get chest  xray. patient does not want to get one.  Discussed treatment of Pneumonia. Will prescribe 7 days of Doxycycline 100mg  BID PO No allergies

## 2011-06-19 NOTE — Progress Notes (Signed)
Addended by: Edd Arbour on: 06/19/2011 04:01 PM   Modules accepted: Orders

## 2011-07-13 ENCOUNTER — Ambulatory Visit (INDEPENDENT_AMBULATORY_CARE_PROVIDER_SITE_OTHER): Payer: Self-pay | Admitting: Family Medicine

## 2011-07-13 ENCOUNTER — Encounter: Payer: Self-pay | Admitting: Family Medicine

## 2011-07-13 VITALS — BP 124/86 | HR 92 | Temp 98.2°F | Ht 61.0 in | Wt 190.5 lb

## 2011-07-13 DIAGNOSIS — E119 Type 2 diabetes mellitus without complications: Secondary | ICD-10-CM

## 2011-07-13 DIAGNOSIS — F988 Other specified behavioral and emotional disorders with onset usually occurring in childhood and adolescence: Secondary | ICD-10-CM

## 2011-07-13 DIAGNOSIS — N760 Acute vaginitis: Secondary | ICD-10-CM

## 2011-07-13 DIAGNOSIS — E669 Obesity, unspecified: Secondary | ICD-10-CM

## 2011-07-13 LAB — POCT GLYCOSYLATED HEMOGLOBIN (HGB A1C): Hemoglobin A1C: 8.5

## 2011-07-13 MED ORDER — METHYLPHENIDATE HCL 10 MG PO TABS: 20.0000 mg | ORAL_TABLET | Freq: Every day | ORAL | Status: DC

## 2011-07-13 MED ORDER — INSULIN GLARGINE 100 UNIT/ML ~~LOC~~ SOLN: 80.0000 [IU] | Freq: Two times a day (BID) | SUBCUTANEOUS | Status: DC

## 2011-07-13 MED ORDER — FLUCONAZOLE 150 MG PO TABS: 150.0000 mg | ORAL_TABLET | Freq: Once | ORAL | Status: DC

## 2011-07-13 MED ORDER — METRONIDAZOLE 0.75 % EX GEL: Freq: Two times a day (BID) | CUTANEOUS | Status: DC

## 2011-07-13 NOTE — Assessment & Plan Note (Signed)
Patient gets frequent yeast and BV infections and keeps refills of diflucan and metronidazole cream per her last PCP. She has been successful with this regimen.

## 2011-07-13 NOTE — Progress Notes (Signed)
  Subjective:    Patient ID: Tracy Weiss, female    DOB: 11-14-1965, 45 y.o.   MRN: [id number]  HPI 1. Diabetes  45 y.o. female for follow up of diabetes. Diabetic Review of Systems - medication compliance: compliant all of the time, diabetic diet compliance: compliant most of the time, home glucose monitoring: is performed regularly, fasting values range 79-120, nonfasting values range 120-374, last eye exam approximately one year ago.  Other symptoms and concerns: none.  2. Obesity  Current weight/BMI :  36 How long have been obese:  Most of adult life Course:  Worsening - 20 lbs in 2 years Problems or symptoms it causes:  Diabetes control, self-image   Things have tried to improve:  Diet  3. ADHD Patient has been using ritalin for some time. She has noticed that taking two pills daily 20mg , is more effective at controlling her adhd than one. No appetite/weightloss. No palpitations. No anxiety. No sleep disruption.   Review of Systems No fever, no chills, no night sweats, no weight loss    Objective:   Physical Exam Filed Vitals:   07/13/11 1339  BP: 124/86  Pulse: 92  Temp: 98.2 F (36.8 C)  TempSrc: Oral  Height: 5\' 1"  (1.549 m)  Weight: 190 lb 8 oz (86.41 kg)  Lungs:  Normal respiratory effort, chest expands symmetrically. Lungs are clear to auscultation, no crackles or wheezes. Heart - Regular rate and rhythm.  No murmurs, gallops or rubs.    Extremities:  No cyanosis, edema, or deformity noted with good range of motion of all major joints.   Constitutional: obese, alert, nad    Assessment & Plan:

## 2011-07-13 NOTE — Assessment & Plan Note (Signed)
Advised patient to count calories using myfitnesspal phone app Advised to exercise daily for 30 minutes or more counseled about health benefits with HTN and Dm with weight loss

## 2011-07-13 NOTE — Assessment & Plan Note (Signed)
No change in medications. Referral for opthalmology for yearly eye exam. Foot exam normal. A1c: pending F/u in 3 months Weight loss

## 2011-07-13 NOTE — Patient Instructions (Signed)
It was great to see you today!  Schedule an appointment to see me in 3 months.  Great work on your Diabetes control.  Keep working on your weight with myfitnesspal.

## 2011-07-13 NOTE — Assessment & Plan Note (Signed)
Doing ok. Still having some concentration issues with school. Increased her dose to 20 mg of ritalin daily. She has done this on her own with successful results. F/u in 3 months

## 2011-08-20 ENCOUNTER — Other Ambulatory Visit (HOSPITAL_COMMUNITY)
Admission: RE | Admit: 2011-08-20 | Discharge: 2011-08-20 | Disposition: A | Discharge status: Home / Self Care | Payer: Self-pay | Source: Ambulatory Visit | Attending: Family Medicine | Admitting: Family Medicine

## 2011-08-20 ENCOUNTER — Ambulatory Visit (INDEPENDENT_AMBULATORY_CARE_PROVIDER_SITE_OTHER): Payer: Self-pay | Admitting: Family Medicine

## 2011-08-20 ENCOUNTER — Encounter: Payer: Self-pay | Admitting: Family Medicine

## 2011-08-20 VITALS — BP 130/85 | HR 90 | Ht 61.0 in | Wt 190.7 lb

## 2011-08-20 DIAGNOSIS — E669 Obesity, unspecified: Secondary | ICD-10-CM

## 2011-08-20 DIAGNOSIS — F988 Other specified behavioral and emotional disorders with onset usually occurring in childhood and adolescence: Secondary | ICD-10-CM

## 2011-08-20 DIAGNOSIS — I10 Essential (primary) hypertension: Secondary | ICD-10-CM

## 2011-08-20 DIAGNOSIS — E119 Type 2 diabetes mellitus without complications: Secondary | ICD-10-CM

## 2011-08-20 DIAGNOSIS — Z124 Encounter for screening for malignant neoplasm of cervix: Secondary | ICD-10-CM

## 2011-08-20 DIAGNOSIS — Z01419 Encounter for gynecological examination (general) (routine) without abnormal findings: Secondary | ICD-10-CM | POA: Insufficient documentation

## 2011-08-20 MED ORDER — METHYLPHENIDATE HCL 20 MG PO TABS: 20.0000 mg | ORAL_TABLET | Freq: Two times a day (BID) | ORAL | Status: DC

## 2011-08-20 MED ORDER — METFORMIN HCL 500 MG PO TABS: 500.0000 mg | ORAL_TABLET | Freq: Two times a day (BID) | ORAL | Status: DC

## 2011-08-20 NOTE — Patient Instructions (Signed)
It has been a pleasure to see you today. Please take the medications as prescribed. I will call you with the labs results if they come back abnormal otherwise you will receive a letter. Make your next appointment in 3 months

## 2011-08-21 ENCOUNTER — Other Ambulatory Visit: Payer: Self-pay

## 2011-08-21 DIAGNOSIS — E119 Type 2 diabetes mellitus without complications: Secondary | ICD-10-CM

## 2011-08-21 LAB — LIPID PANEL
Cholesterol: 194 mg/dL (ref 0–200)
HDL: 41 mg/dL (ref >39)
LDL Cholesterol: 107 mg/dL — ABNORMAL HIGH (ref 0–99)
Total CHOL/HDL Ratio: 4.7 Ratio
Triglycerides: 228 mg/dL — ABNORMAL HIGH (ref <150)
VLDL: 46 mg/dL — ABNORMAL HIGH (ref 0–40)

## 2011-08-21 LAB — TSH: TSH: 1.22 u[IU]/mL (ref 0.350–4.500)

## 2011-08-21 NOTE — Assessment & Plan Note (Addendum)
Obese with BMI of 36.. On exercise and diet plan. Plan: Discussed goals. Pt motivated to lose weight. General diabetes diet recommendations given. Possible consult with diabetes education/nutrition if weight continues to increase. TSH Lipid profile

## 2011-08-21 NOTE — Assessment & Plan Note (Signed)
Controlled on Methylphenidate 20 mg BID. Pt reports the loss of prescriptions. We looked  In control substance database and the meds were not filled. Plan:  We refilled this medication. Reevaluate if another episode of prescription loss is reported.

## 2011-08-21 NOTE — Progress Notes (Signed)
  Subjective:    Patient ID: York Spaniel, female    DOB: 19-Jun-1966, 45 y.o.   MRN: [id number]  HPI Pt that comes for a full physical today. Her principal complaints is her weight that she is been increasing regardless the amount of exercise and healthy lifestyles she has adopted in the past year. States she is actually studying in [facility] and she is taking classes of nutrition. Pt is concerned about her inability to decrease her weight. #1. DM On Lantus 80 units BID compliant. Has treatment also with Novolog but she is using this once a day if so. A1C today 8.5. No symptoms of hypoglycemia. No other side effects. Was on Metformin in the past, she does not recall why was this medication was stopped.  #2. HTN: Controlled on Benicar (40/25) compliant. No side effects reported. #3. Attention Deficit Disorder: On Methylphenidate and symptom controlled on 20 mg BID.   Review of Systems Per HPI    Objective:   Physical Exam  Constitutional: She is oriented to person, place, and time. No distress.  HENT:  Mouth/Throat: Oropharynx is clear and moist.  Eyes: Conjunctivae and EOM are normal. Pupils are equal, round, and reactive to light.  Neck: No thyromegaly present.  Cardiovascular: Normal rate and regular rhythm.   No murmur heard. Pulmonary/Chest: Effort normal and breath sounds normal.  Abdominal: Soft. Bowel sounds are normal.  Genitourinary: Vagina normal and uterus normal. No vaginal discharge found.       Pap smear sample taken today. Norma cervix. Still some minor bleeding from menstruation. Bimanual: No tender to palpation. No adnexal mass found.  Musculoskeletal: She exhibits no edema.  Lymphadenopathy:    She has no cervical adenopathy.  Neurological: She is alert and oriented to person, place, and time. She has normal reflexes.  Psychiatric: She has a normal mood and affect. Her behavior is normal. Judgment and thought content normal.          Assessment & Plan:

## 2011-08-21 NOTE — Assessment & Plan Note (Signed)
On insuline 80 units BID. A1C 8.5 goal under 7.0.  Plan: Add Metformin 500 mg BID.  Check A1C in three months.

## 2011-08-21 NOTE — Assessment & Plan Note (Signed)
On Benicar. Controlled. Plan:  Continue treatment. Weight management.

## 2011-08-21 NOTE — Progress Notes (Signed)
FLP AND TSH DONE TODAY [facility] 

## 2011-08-31 ENCOUNTER — Telehealth: Payer: Self-pay | Admitting: Family Medicine

## 2011-08-31 ENCOUNTER — Encounter: Payer: Self-pay | Admitting: Family Medicine

## 2011-08-31 NOTE — Telephone Encounter (Signed)
Called pt left a message. In case she calls back please tell her the result of her Lipid Panel. Cholesterol (LDL) is 107 goal under 100. 2 years ago was 109 (elevated). Also Triglycerides are 228, normal value is under 150, two years ago was normal. She should schedule a visit to talk about plan to manage this. Thanks.

## 2011-09-17 ENCOUNTER — Other Ambulatory Visit: Payer: Self-pay | Admitting: Family Medicine

## 2011-09-17 DIAGNOSIS — Z1231 Encounter for screening mammogram for malignant neoplasm of breast: Secondary | ICD-10-CM

## 2011-09-27 ENCOUNTER — Telehealth: Payer: Self-pay | Admitting: Family Medicine

## 2011-09-27 NOTE — Telephone Encounter (Signed)
Pt called reporting emesis beginning yesterday evening.  Emesis is non-bloody and intermittent.  Is able to keep food/drink down.  Has been feeling under the weather recently as well.  Normal BM.  Is concerned that this might be related to her pancreas or her gall bladder.  Informed her that she could either come to the ER or wait and come to the clinic in the AM.  Symptoms do not sound terribly severe so I recommended tylenol and clinic in the morning.  Reviewed red flags.  Pt agreeable to plan.

## 2011-10-10 ENCOUNTER — Encounter: Payer: Self-pay | Admitting: Family Medicine

## 2011-10-10 ENCOUNTER — Ambulatory Visit (INDEPENDENT_AMBULATORY_CARE_PROVIDER_SITE_OTHER): Payer: Self-pay | Admitting: Family Medicine

## 2011-10-10 VITALS — BP 142/95 | HR 88 | Temp 97.7°F | Ht 61.0 in | Wt 190.2 lb

## 2011-10-10 DIAGNOSIS — R635 Abnormal weight gain: Secondary | ICD-10-CM

## 2011-10-10 DIAGNOSIS — E785 Hyperlipidemia, unspecified: Secondary | ICD-10-CM

## 2011-10-10 DIAGNOSIS — E119 Type 2 diabetes mellitus without complications: Secondary | ICD-10-CM

## 2011-10-10 DIAGNOSIS — I10 Essential (primary) hypertension: Secondary | ICD-10-CM

## 2011-10-10 LAB — POCT GLYCOSYLATED HEMOGLOBIN (HGB A1C): Hemoglobin A1C: 7.2

## 2011-10-10 NOTE — Patient Instructions (Addendum)
Make an appointment to f/u in 3-4 months Hypertriglyceridemia  Diet for High blood levels of Triglycerides Most fats in food are triglycerides. Triglycerides in your blood are stored as fat in your body. High levels of triglycerides in your blood may put you at a greater risk for heart disease and stroke.  Normal triglyceride levels are less than 150 mg/dL. Borderline high levels are 150-199 mg/dl. High levels are 200 - 499 mg/dL, and very high triglyceride levels are greater than 500 mg/dL. The decision to treat high triglycerides is generally based on the level. For people with borderline or high triglyceride levels, treatment includes weight loss and exercise. Drugs are recommended for people with very high triglyceride levels. Many people who need treatment for high triglyceride levels have metabolic syndrome. This syndrome is a collection of disorders that often include: insulin resistance, high blood pressure, blood clotting problems, high cholesterol and triglycerides. TESTING PROCEDURE FOR TRIGLYCERIDES  You should not eat 4 hours before getting your triglycerides measured. The normal range of triglycerides is between 10 and 250 milligrams per deciliter (mg/dl). Some people may have extreme levels (1000 or above), but your triglyceride level may be too high if it is above 150 mg/dl, depending on what other risk factors you have for heart disease.   People with high blood triglycerides may also have high blood cholesterol levels. If you have high blood cholesterol as well as high blood triglycerides, your risk for heart disease is probably greater than if you only had high triglycerides. High blood cholesterol is one of the main risk factors for heart disease.  CHANGING YOUR DIET  Your weight can affect your blood triglyceride level. If you are more than 20% above your ideal body weight, you may be able to lower your blood triglycerides by losing weight. Eating less and exercising regularly is the  best way to combat this. Fat provides more calories than any other food. The best way to lose weight is to eat less fat. Only 30% of your total calories should come from fat. Less than 7% of your diet should come from saturated fat. A diet low in fat and saturated fat is the same as a diet to decrease blood cholesterol. By eating a diet lower in fat, you may lose weight, lower your blood cholesterol, and lower your blood triglyceride level.  Eating a diet low in fat, especially saturated fat, may also help you lower your blood triglyceride level. Ask your dietitian to help you figure how much fat you can eat based on the number of calories your caregiver has prescribed for you.  Exercise, in addition to helping with weight loss may also help lower triglyceride levels.   Alcohol can increase blood triglycerides. You may need to stop drinking alcoholic beverages.   Too much carbohydrate in your diet may also increase your blood triglycerides. Some complex carbohydrates are necessary in your diet. These may include bread, rice, potatoes, other starchy vegetables and cereals.   Reduce "simple" carbohydrates. These may include pure sugars, candy, honey, and jelly without losing other nutrients. If you have the kind of high blood triglycerides that is affected by the amount of carbohydrates in your diet, you will need to eat less sugar and less high-sugar foods. Your caregiver can help you with this.   Adding 2-4 grams of fish oil (EPA+ DHA) may also help lower triglycerides. Speak with your caregiver before adding any supplements to your regimen.  Following the Diet  Maintain your ideal weight. Your  caregivers can help you with a diet. Generally, eating less food and getting more exercise will help you lose weight. Joining a weight control group may also help. Ask your caregivers for a good weight control group in your area.  Eat low-fat foods instead of high-fat foods. This can help you lose weight too.    These foods are lower in fat. Eat MORE of these:   Dried beans, peas, and lentils.   Egg whites.   Low-fat cottage cheese.   Fish.   Lean cuts of meat, such as round, sirloin, rump, and flank (cut extra fat off meat you fix).   Whole grain breads, cereals and pasta.   Skim and nonfat dry milk.   Low-fat yogurt.   Poultry without the skin.   Cheese made with skim or part-skim milk, such as mozzarella, parmesan, farmers', ricotta, or pot cheese.  These are higher fat foods. Eat LESS of these:   Whole milk and foods made from whole milk, such as American, blue, cheddar, monterey jack, and swiss cheese   High-fat meats, such as luncheon meats, sausages, knockwurst, bratwurst, hot dogs, ribs, corned beef, ground pork, and regular ground beef.   Fried foods.  Limit saturated fats in your diet. Substituting unsaturated fat for saturated fat may decrease your blood triglyceride level. You will need to read package labels to know which products contain saturated fats.  These foods are high in saturated fat. Eat LESS of these:   Fried pork skins.   Whole milk.   Skin and fat from poultry.   Palm oil.   Butter.   Shortening.   Cream cheese.   Tracy Weiss.   Margarines and baked goods made from listed oils.   Vegetable shortenings.   Chitterlings.   Fat from meats.   Coconut oil.   Palm kernel oil.   Lard.   Cream.   Sour cream.   Fatback.   Coffee whiteners and non-dairy creamers made with these oils.   Cheese made from whole milk.  Use unsaturated fats (both polyunsaturated and monounsaturated) moderately. Remember, even though unsaturated fats are better than saturated fats; you still want a diet low in total fat.  These foods are high in unsaturated fat:   Canola oil.   Sunflower oil.   Mayonnaise.   Almonds.   Peanuts.   Pine nuts.   Margarines made with these oils.   Safflower oil.   Olive oil.   Avocados.   Cashews.   Peanut  butter.   Sunflower seeds.   Soybean oil.   Peanut oil.   Olives.   Pecans.   Walnuts.   Pumpkin seeds.  Avoid sugar and other high-sugar foods. This will decrease carbohydrates without decreasing other nutrients. Sugar in your food goes rapidly to your blood. When there is excess sugar in your blood, your liver may use it to make more triglycerides. Sugar also contains calories without other important nutrients.  Eat LESS of these:   Sugar, brown sugar, powdered sugar, jam, jelly, preserves, honey, syrup, molasses, pies, candy, cakes, cookies, frosting, pastries, colas, soft drinks, punches, fruit drinks, and regular gelatin.   Avoid alcohol. Alcohol, even more than sugar, may increase blood triglycerides. In addition, alcohol is high in calories and low in nutrients. Ask for sparkling water, or a diet soft drink instead of an alcoholic beverage.  Suggestions for planning and preparing meals   Bake, broil, grill or roast meats instead of frying.   Remove fat from meats and skin  from poultry before cooking.   Add spices, herbs, lemon juice or vinegar to vegetables instead of salt, rich sauces or gravies.   Use a non-stick skillet without fat or use no-stick sprays.   Cool and refrigerate stews and broth. Then remove the hardened fat floating on the surface before serving.   Refrigerate meat drippings and skim off fat to make low-fat gravies.   Serve more fish.   Use less butter, margarine and other high-fat spreads on bread or vegetables.   Use skim or reconstituted non-fat dry milk for cooking.   Cook with low-fat cheeses.   Substitute low-fat yogurt or cottage cheese for all or part of the sour cream in recipes for sauces, dips or congealed salads.   Use half yogurt/half mayonnaise in salad recipes.   Substitute evaporated skim milk for cream. Evaporated skim milk or reconstituted non-fat dry milk can be whipped and substituted for whipped cream in certain recipes.    Choose fresh fruits for dessert instead of high-fat foods such as pies or cakes. Fruits are naturally low in fat.  When Dining Out   Order low-fat appetizers such as fruit or vegetable juice, pasta with vegetables or tomato sauce.   Select clear, rather than cream soups.   Ask that dressings and gravies be served on the side. Then use less of them.   Order foods that are baked, broiled, poached, steamed, stir-fried, or roasted.   Ask for margarine instead of butter, and use only a small amount.   Drink sparkling water, unsweetened tea or coffee, or diet soft drinks instead of alcohol or other sweet beverages.  QUESTIONS AND ANSWERS ABOUT OTHER FATS IN THE BLOOD: SATURATED FAT, TRANS FAT, AND CHOLESTEROL What is trans fat? Trans fat is a type of fat that is formed when vegetable oil is hardened through a process called hydrogenation. This process helps makes foods more solid, gives them shape, and prolongs their shelf life. Trans fats are also called hydrogenated or partially hydrogenated oils.  What do saturated fat, trans fat, and cholesterol in foods have to do with heart disease? Saturated fat, trans fat, and cholesterol in the diet all raise the level of LDL "bad" cholesterol in the blood. The higher the LDL cholesterol, the greater the risk for coronary heart disease (CHD). Saturated fat and trans fat raise LDL similarly.  What foods contain saturated fat, trans fat, and cholesterol? High amounts of saturated fat are found in animal products, such as fatty cuts of meat, chicken skin, and full-fat dairy products like butter, whole milk, cream, and cheese, and in tropical vegetable oils such as palm, palm kernel, and coconut oil. Trans fat is found in some of the same foods as saturated fat, such as vegetable shortening, some margarines (especially hard or stick margarine), crackers, cookies, baked goods, fried foods, salad dressings, and other processed foods made with partially  hydrogenated vegetable oils. Small amounts of trans fat also occur naturally in some animal products, such as milk products, beef, and lamb. Foods high in cholesterol include liver, other organ meats, egg yolks, shrimp, and full-fat dairy products. How can I use the new food label to make heart-healthy food choices? Check the Nutrition Facts panel of the food label. Choose foods lower in saturated fat, trans fat, and cholesterol. For saturated fat and cholesterol, you can also use the Percent Daily Value (%DV): 5% DV or less is low, and 20% DV or more is high. (There is no %DV for trans fat.) Use the  Nutrition Facts panel to choose foods low in saturated fat and cholesterol, and if the trans fat is not listed, read the ingredients and limit products that list shortening or hydrogenated or partially hydrogenated vegetable oil, which tend to be high in trans fat. POINTS TO REMEMBER: YOU NEED A LITTLE TLC (THERAPEUTIC LIFESTYLE CHANGES)  Discuss your risk for heart disease with your caregivers, and take steps to reduce risk factors.   Change your diet. Choose foods that are low in saturated fat, trans fat, and cholesterol.   Add exercise to your daily routine if it is not already being done. Participate in physical activity of moderate intensity, like brisk walking, for at least 30 minutes on most, and preferably all days of the week. No time? Break the 30 minutes into three, 10-minute segments during the day.   Stop smoking. If you do smoke, contact your caregiver to discuss ways in which they can help you quit.   Do not use street drugs.   Maintain a normal weight.   Maintain a healthy blood pressure.   Keep up with your blood work for checking the fats in your blood as directed by your caregiver.  Document Released: 06/07/2004 Document Revised: 05/02/2011 Document Reviewed: 01/03/2009 [facility] Patient Information 2012 [place], [place].  Cholesterol Control Diet Cholesterol levels in your body  are determined significantly by your diet. Cholesterol levels may also be related to heart disease. The following material helps to explain this relationship and discusses what you can do to help keep your heart healthy. Not all cholesterol is bad. Low-density lipoprotein (LDL) cholesterol is the "bad" cholesterol. It may cause fatty deposits to build up inside your arteries. High-density lipoprotein (HDL) cholesterol is "good." It helps to remove the "bad" LDL cholesterol from your blood. Cholesterol is a very important risk factor for heart disease. Other risk factors are high blood pressure, smoking, stress, heredity, and weight. The heart muscle gets its supply of blood through the coronary arteries. If your LDL cholesterol is high and your HDL cholesterol is low, you are at risk for having fatty deposits build up in your coronary arteries. This leaves less room through which blood can flow. Without sufficient blood and oxygen, the heart muscle cannot function properly and you may feel chest pains (angina pectoris). When a coronary artery closes up entirely, a part of the heart muscle may die, causing a heart attack (myocardial infarction). CHECKING CHOLESTEROL When your caregiver sends your blood to a lab to be analyzed for cholesterol, a complete lipid (fat) profile may be done. With this test, the total amount of cholesterol and levels of LDL and HDL are determined. Triglycerides are a type of fat that circulates in the blood and can also be used to determine heart disease risk. The list below describes what the numbers should be: Test: Total Cholesterol.  Less than 200 mg/dl.  Test: LDL "bad cholesterol."  Less than 100 mg/dl.   Less than 70 mg/dl if you are at very high risk of a heart attack or sudden cardiac death.  Test: HDL "good cholesterol."  Greater than 50 mg/dl for women.   Greater than 40 mg/dl for men.  Test: Triglycerides.  Less than 150 mg/dl.  CONTROLLING CHOLESTEROL WITH  DIET Although exercise and lifestyle factors are important, your diet is key. That is because certain foods are known to raise cholesterol and others to lower it. The goal is to balance foods for their effect on cholesterol and more importantly, to replace saturated and  trans fat with other types of fat, such as monounsaturated fat, polyunsaturated fat, and omega-3 fatty acids. On average, a person should consume no more than 15 to 17 g of saturated fat daily. Saturated and trans fats are considered "bad" fats, and they will raise LDL cholesterol. Saturated fats are primarily found in animal products such as meats, butter, and cream. However, that does not mean you need to sacrifice all your favorite foods. Today, there are good tasting, low-fat, low-cholesterol substitutes for most of the things you like to eat. Choose low-fat or nonfat alternatives. Choose round or loin cuts of red meat, since these types of cuts are lowest in fat and cholesterol. Chicken (without the skin), fish, veal, and ground [place] breast are excellent choices. Eliminate fatty meats, such as hot dogs and salami. Even shellfish have little or no saturated fat. Have a 3 oz (85 g) portion when you eat lean meat, poultry, or fish. Trans fats are also called "partially hydrogenated oils." They are oils that have been scientifically manipulated so that they are solid at room temperature resulting in a longer shelf life and improved taste and texture of foods in which they are added. Trans fats are found in stick margarine, some tub margarines, cookies, crackers, and baked goods.  When baking and cooking, oils are an excellent substitute for butter. The monounsaturated oils are especially beneficial since it is believed they lower LDL and raise HDL. The oils you should avoid entirely are saturated tropical oils, such as coconut and palm.  Remember to eat liberally from food groups that are naturally free of saturated and trans fat, including  fish, fruit, vegetables, beans, grains (barley, rice, couscous, bulgur wheat), and pasta (without cream sauces).  IDENTIFYING FOODS THAT LOWER CHOLESTEROL  Soluble fiber may lower your cholesterol. This type of fiber is found in fruits such as apples, vegetables such as broccoli, potatoes, and carrots, legumes such as beans, peas, and lentils, and grains such as barley. Foods fortified with plant sterols (phytosterol) may also lower cholesterol. You should eat at least 2 g per day of these foods for a cholesterol lowering effect.  Read package labels to identify low-saturated fats, trans fats free, and low-fat foods at the supermarket. Select cheeses that have only 2 to 3 g saturated fat per ounce. Use a heart-healthy tub margarine that is free of trans fats or partially hydrogenated oil. When buying baked goods (cookies, crackers), avoid partially hydrogenated oils. Breads and muffins should be made from whole grains (whole-wheat or whole oat flour, instead of "flour" or "enriched flour"). Buy non-creamy canned soups with reduced salt and no added fats.  FOOD PREPARATION TECHNIQUES  Never deep-fry. If you must fry, either stir-fry, which uses very little fat, or use non-stick cooking sprays. When possible, broil, bake, or roast meats, and steam vegetables. Instead of dressing vegetables with butter or margarine, use lemon and herbs, applesauce and cinnamon (for squash and sweet potatoes), nonfat yogurt, salsa, and low-fat dressings for salads.  LOW-SATURATED FAT / LOW-FAT FOOD SUBSTITUTES Meats / Saturated Fat (g)  Avoid: Steak, marbled (3 oz/85 g) / 11 g   Choose: Steak, lean (3 oz/85 g) / 4 g   Avoid: Hamburger (3 oz/85 g) / 7 g   Choose: Hamburger, lean (3 oz/85 g) / 5 g   Avoid: Ham (3 oz/85 g) / 6 g   Choose: Ham, lean cut (3 oz/85 g) / 2.4 g   Avoid: Chicken, with skin, dark meat (3 oz/85 g) /  4 g   Choose: Chicken, skin removed, dark meat (3 oz/85 g) / 2 g   Avoid: Chicken, with  skin, light meat (3 oz/85 g) / 2.5 g   Choose: Chicken, skin removed, light meat (3 oz/85 g) / 1 g  Dairy / Saturated Fat (g)  Avoid: Whole milk (1 cup) / 5 g   Choose: Low-fat milk, 2% (1 cup) / 3 g   Choose: Low-fat milk, 1% (1 cup) / 1.5 g   Choose: Skim milk (1 cup) / 0.3 g   Avoid: Hard cheese (1 oz/28 g) / 6 g   Choose: Skim milk cheese (1 oz/28 g) / 2 to 3 g   Avoid: Cottage cheese, 4% fat (1 cup) / 6.5 g   Choose: Low-fat cottage cheese, 1% fat (1 cup) / 1.5 g   Avoid: Ice cream (1 cup) / 9 g   Choose: Sherbet (1 cup) / 2.5 g   Choose: Nonfat frozen yogurt (1 cup) / 0.3 g   Choose: Frozen fruit bar / trace   Avoid: Whipped cream (1 tbs) / 3.5 g   Choose: Nondairy whipped topping (1 tbs) / 1 g  Condiments / Saturated Fat (g)  Avoid: Mayonnaise (1 tbs) / 2 g   Choose: Low-fat mayonnaise (1 tbs) / 1 g   Avoid: Butter (1 tbs) / 7 g   Choose: Extra light margarine (1 tbs) / 1 g   Avoid: Coconut oil (1 tbs) / 11.8 g   Choose: Olive oil (1 tbs) / 1.8 g   Choose: Corn oil (1 tbs) / 1.7 g   Choose: Safflower oil (1 tbs) / 1.2 g   Choose: Sunflower oil (1 tbs) / 1.4 g   Choose: Soybean oil (1 tbs) / 2.4 g   Choose: Canola oil (1 tbs) / 1 g  Document Released: 08/20/2005 Document Revised: 05/02/2011 Document Reviewed: 02/08/2011 [facility] Patient Information 2012 [place], [place].

## 2011-10-12 DIAGNOSIS — E785 Hyperlipidemia, unspecified: Secondary | ICD-10-CM | POA: Insufficient documentation

## 2011-10-12 NOTE — Assessment & Plan Note (Signed)
Pt has already improved her A1C in the last three months, she is enthusiastic and wants to try diet and life style changes first. Plan: Nutrition consult for weight management. If in three months no changes pt agreeable with start statins.

## 2011-10-12 NOTE — Progress Notes (Signed)
  Subjective:    Patient ID: Tracy Weiss, female    DOB: 1966-02-17, 46 y.o.   MRN: [id number]  HPI Pt comes today to address her labs results (lipid profile). This is a first time since 2008 that her cholesterol and TG are elevated. No active treatment for this condition.  She has DM type 2 controlled with Insuline Lantus 80u BID but is currently using 60 units. Insuline Novolog 10u w/meals but she is using just 5u when her "sugars are high". Also with Metformin 500mg   BID. With no side effects She went to one DM education class but was not very helpful to her and she did not return. Her A1 C today was 7.2 and prior on Nov/11(aprox 3 months ago) was 8.5. Showing improvement but still not at target. Pt weight is 190.2 she has been slowly increasing her weight but has been stable for the past 3 months. Her BMI is 35.96.  Her other chronic condition is HTN. She takes Benicar HCT40/25 daily today her BP was 142/95 but she hasn't taken it today.  Pt with Attention Deficit without hyperactivity controlled on Ritalin 20 mg daily.  Pt is very enthusiastic about making life styles changes that can help with her chronic conditions.  Review of Systems  Constitutional: Negative for fever, activity change and appetite change.  HENT: Negative.   Eyes: Negative for visual disturbance.  Respiratory: Negative for chest tightness and shortness of breath.   Cardiovascular: Negative for chest pain, palpitations and leg swelling.  Genitourinary: Negative for dysuria and frequency.  Musculoskeletal: Negative.   Neurological: Negative for dizziness and weakness.  Psychiatric/Behavioral: Negative for hallucinations, behavioral problems, confusion, dysphoric mood and agitation.       Objective:   Physical Exam  Constitutional: She is oriented to person, place, and time. No distress.  HENT:  Mouth/Throat: Oropharynx is clear and moist.  Eyes: Conjunctivae and EOM are normal. Pupils are equal, round, and reactive to  light.       Left eye with two small subconjunctival hemorrhages. No changes in vision.  Cardiovascular: Normal rate, regular rhythm, normal heart sounds and intact distal pulses.   No murmur heard. Pulmonary/Chest: Effort normal and breath sounds normal. No respiratory distress. She has no rales.  Abdominal: Soft. Bowel sounds are normal. There is no tenderness.  Musculoskeletal: She exhibits no edema.  Neurological: She is alert and oriented to person, place, and time. She has normal reflexes.       No focal neurologic findings.  Skin: Skin is warm and dry.  Psychiatric: She has a normal mood and affect. Her behavior is normal. Judgment and thought content normal.          Assessment & Plan:

## 2011-10-12 NOTE — Assessment & Plan Note (Signed)
Elevated BP today. Pt has not taken her daily Benicar HCT. Plan: Take meds as prescribed and recheck BP. If next visit still elevated will adjust treatment.

## 2011-10-12 NOTE — Assessment & Plan Note (Signed)
A1C from 8.5 to 7.2 in 3 months. Improved. No side effects of DM medications (pt on Insuline Lantus, Novolog and Metformin) Plan: Continue same recommendations. Discussed with pt amount of insulin needed and importance of taking as prescribed. Nutrition consult for weight management. Pt agree to have ophthalmology consulted once a year.

## 2011-10-23 ENCOUNTER — Telehealth: Payer: Self-pay | Admitting: Family Medicine

## 2011-10-23 ENCOUNTER — Other Ambulatory Visit: Payer: Self-pay | Admitting: Family Medicine

## 2011-10-23 NOTE — Telephone Encounter (Signed)
Pt is anxious about getting labs done for a hep B panel - she has a friend that just found out she has it and patient is concerned - needs to hear back within the next couple of days.

## 2011-10-23 NOTE — Telephone Encounter (Signed)
Will forward message to Dr. Aviva Signs.

## 2011-10-24 NOTE — Telephone Encounter (Signed)
patient is scheduled for 2/21 for labs

## 2011-10-24 NOTE — Telephone Encounter (Signed)
I will put an future order for several tests if she is concerned about sexually/blood transmitted disease. She can call and make a lab appointment.

## 2011-10-25 ENCOUNTER — Other Ambulatory Visit: Payer: Self-pay | Admitting: Family Medicine

## 2011-10-25 ENCOUNTER — Other Ambulatory Visit: Payer: Self-pay

## 2011-10-25 DIAGNOSIS — Z7251 High risk heterosexual behavior: Secondary | ICD-10-CM

## 2011-10-25 NOTE — Progress Notes (Signed)
LABS DRAWN FOR HEP B  NEWSOME,CRYSTAL

## 2011-10-26 ENCOUNTER — Ambulatory Visit (HOSPITAL_COMMUNITY)
Admission: RE | Admit: 2011-10-26 | Discharge: 2011-10-26 | Disposition: A | Discharge status: Home / Self Care | Payer: Self-pay | Source: Ambulatory Visit | Attending: Family Medicine | Admitting: Family Medicine

## 2011-10-26 DIAGNOSIS — Z1231 Encounter for screening mammogram for malignant neoplasm of breast: Secondary | ICD-10-CM | POA: Insufficient documentation

## 2011-10-26 LAB — HEPATITIS B SURFACE ANTIGEN: Hepatitis B Surface Ag: NEGATIVE

## 2011-10-30 ENCOUNTER — Encounter: Payer: Self-pay | Admitting: Family Medicine

## 2011-10-30 ENCOUNTER — Ambulatory Visit (INDEPENDENT_AMBULATORY_CARE_PROVIDER_SITE_OTHER): Payer: Self-pay | Admitting: Family Medicine

## 2011-10-30 VITALS — BP 119/79 | HR 89 | Temp 98.2°F | Wt 189.0 lb

## 2011-10-30 DIAGNOSIS — N760 Acute vaginitis: Secondary | ICD-10-CM

## 2011-10-30 DIAGNOSIS — A499 Bacterial infection, unspecified: Secondary | ICD-10-CM

## 2011-10-30 DIAGNOSIS — B9689 Other specified bacterial agents as the cause of diseases classified elsewhere: Secondary | ICD-10-CM

## 2011-10-30 LAB — POCT WET PREP (WET MOUNT)

## 2011-10-30 MED ORDER — METRONIDAZOLE 500 MG PO TABS: 500.0000 mg | ORAL_TABLET | Freq: Two times a day (BID) | ORAL | Status: AC

## 2011-10-30 MED ORDER — METRONIDAZOLE 0.75 % EX GEL: CUTANEOUS | Status: DC

## 2011-10-30 NOTE — Patient Instructions (Signed)
Bacterial Vaginosis Bacterial vaginosis (BV) is a vaginal infection where the normal balance of bacteria in the vagina is disrupted. The normal balance is then replaced by an overgrowth of certain bacteria. There are several different kinds of bacteria that can cause BV. BV is the most common vaginal infection in women of childbearing age. CAUSES   The cause of BV is not fully understood. BV develops when there is an increase or imbalance of harmful bacteria.   Some activities or behaviors can upset the normal balance of bacteria in the vagina and put women at increased risk including:   Having a new sex partner or multiple sex partners.   Douching.   Using an intrauterine device (IUD) for contraception.   It is not clear what role sexual activity plays in the development of BV. However, women that have never had sexual intercourse are rarely infected with BV.  Women do not get BV from toilet seats, bedding, swimming pools or from touching objects around them.  SYMPTOMS   Grey vaginal discharge.   A fish-like odor with discharge, especially after sexual intercourse.   Itching or burning of the vagina and vulva.   Burning or pain with urination.   Some women have no signs or symptoms at all.  DIAGNOSIS  Your caregiver must examine the vagina for signs of BV. Your caregiver will perform lab tests and look at the sample of vaginal fluid through a microscope. They will look for bacteria and abnormal cells (clue cells), a pH test higher than 4.5, and a positive amine test all associated with BV.  RISKS AND COMPLICATIONS   Pelvic inflammatory disease (PID).   Infections following gynecology surgery.   Developing HIV.   Developing herpes virus.  TREATMENT  Sometimes BV will clear up without treatment. However, all women with symptoms of BV should be treated to avoid complications, especially if gynecology surgery is planned. Female partners generally do not need to be treated. However,  BV may spread between female sex partners so treatment is helpful in preventing a recurrence of BV.   BV may be treated with antibiotics. The antibiotics come in either pill or vaginal cream forms. Either can be used with nonpregnant or pregnant women, but the recommended dosages differ. These antibiotics are not harmful to the baby.   BV can recur after treatment. If this happens, a second round of antibiotics will often be prescribed.   Treatment is important for pregnant women. If not treated, BV can cause a premature delivery, especially for a pregnant woman who had a premature birth in the past. All pregnant women who have symptoms of BV should be checked and treated.   For chronic reoccurrence of BV, treatment with a type of prescribed gel vaginally twice a week is helpful.  HOME CARE INSTRUCTIONS   Finish all medication as directed by your caregiver.   Do not have sex until treatment is completed.   Tell your sexual partner that you have a vaginal infection. They should see their caregiver and be treated if they have problems, such as a mild rash or itching.   Practice safe sex. Use condoms. Only have 1 sex partner.  PREVENTION  Basic prevention steps can help reduce the risk of upsetting the natural balance of bacteria in the vagina and developing BV:  Do not have sexual intercourse (be abstinent).   Do not douche.   Use all of the medicine prescribed for treatment of BV, even if the signs and symptoms go away.     Tell your sex partner if you have BV. That way, they can be treated, if needed, to prevent reoccurrence.  SEEK MEDICAL CARE IF:   Your symptoms are not improving after 3 days of treatment.   You have increased discharge, pain, or fever.  MAKE SURE YOU:   Understand these instructions.   Will watch your condition.   Will get help right away if you are not doing well or get worse.  FOR MORE INFORMATION  Division of STD Prevention (DSTDP), Centers for Disease  Control and Prevention: www.cdc.gov/std American Social Health Association (ASHA): www.ashastd.org  Document Released: 08/20/2005 Document Revised: 05/02/2011 Document Reviewed: 02/10/2009 Exit[employer] Patient Information 2012 [employer]. 

## 2011-10-30 NOTE — Progress Notes (Signed)
  Subjective:    Patient ID: York Spaniel, female    DOB: 1965/11/07, 46 y.o.   MRN: [id number]  HPIcc: vag discahrge  Several days to weeks of vaginal discharge.  Has chronic BV which she treats with metrogel.  Had not resolved despite treatment over the past three days.  Foul odor, thin white d/c. Changed with stage of her menstrual cycle.   Some dyspareunia, no sig pelvic pain, fever, or dysuria.  Uses douches and vaginal PH balancing products.  Review of Systems See HPI    Objective:   Physical Exam  GEN: NAD Pelvic Exam:        External: normal female genitalia without lesions or masses        Vagina: normal without lesions or masses, thin white discharge noted.        Cervix: normal without lesions or masses        Adnexa: normal bimanual exam without masses or fullness        Uterus: normal by palpation        Samples for Wet prep, GC/Chlamydia obtained       Assessment & Plan:

## 2011-10-30 NOTE — Assessment & Plan Note (Signed)
Recurrent bacterial vaginosis, seen on w et prep again today.  Also screened for GC/ Chlamydia.  Discussed avoidng douches, vaginal products.  Will rx flagyl x 10 days, then will prophyalx with twice weekly metrogel.  Discussed benign yet chronic nature of BV.

## 2011-11-05 ENCOUNTER — Other Ambulatory Visit: Payer: Self-pay | Admitting: Family Medicine

## 2011-11-05 NOTE — Progress Notes (Signed)
Received Rx request for strattera 18mg  daily for ADHD. Review of records reveals filled on 11/07/2010 as patient was switched from ritalin. All subsequent records discuss patient being well controlled on ritalin including most recent note on 08/21/11. Faxed request to pharmacy indicating will not refill strattera at this time as patient on alternate therapy for ADHD.   Tana Conch, MD, PGY1 11/05/2011 5:32 PM

## 2011-11-15 ENCOUNTER — Ambulatory Visit: Payer: Self-pay | Admitting: Family Medicine

## 2011-11-16 ENCOUNTER — Ambulatory Visit: Payer: Self-pay | Admitting: Family Medicine

## 2011-12-13 ENCOUNTER — Ambulatory Visit (HOSPITAL_COMMUNITY)
Admission: RE | Admit: 2011-12-13 | Discharge: 2011-12-13 | Disposition: A | Discharge status: Home / Self Care | Payer: Self-pay | Source: Ambulatory Visit | Attending: Family Medicine | Admitting: Family Medicine

## 2011-12-13 ENCOUNTER — Ambulatory Visit (INDEPENDENT_AMBULATORY_CARE_PROVIDER_SITE_OTHER): Payer: Self-pay | Admitting: Family Medicine

## 2011-12-13 ENCOUNTER — Other Ambulatory Visit: Payer: Self-pay | Admitting: Family Medicine

## 2011-12-13 VITALS — BP 125/85 | HR 92 | Temp 98.1°F | Ht 61.0 in | Wt 190.0 lb

## 2011-12-13 DIAGNOSIS — R002 Palpitations: Secondary | ICD-10-CM

## 2011-12-13 DIAGNOSIS — R55 Syncope and collapse: Secondary | ICD-10-CM | POA: Insufficient documentation

## 2011-12-13 DIAGNOSIS — G478 Other sleep disorders: Secondary | ICD-10-CM

## 2011-12-13 DIAGNOSIS — G4730 Sleep apnea, unspecified: Secondary | ICD-10-CM

## 2011-12-13 DIAGNOSIS — B9689 Other specified bacterial agents as the cause of diseases classified elsewhere: Secondary | ICD-10-CM

## 2011-12-13 DIAGNOSIS — I10 Essential (primary) hypertension: Secondary | ICD-10-CM

## 2011-12-13 DIAGNOSIS — A499 Bacterial infection, unspecified: Secondary | ICD-10-CM

## 2011-12-13 DIAGNOSIS — F988 Other specified behavioral and emotional disorders with onset usually occurring in childhood and adolescence: Secondary | ICD-10-CM

## 2011-12-13 DIAGNOSIS — N760 Acute vaginitis: Secondary | ICD-10-CM

## 2011-12-13 LAB — BASIC METABOLIC PANEL
BUN: 7 mg/dL (ref 6–23)
CO2: 28 mEq/L (ref 19–32)
Calcium: 9.4 mg/dL (ref 8.4–10.5)
Chloride: 103 mEq/L (ref 96–112)
Creat: 0.69 mg/dL (ref 0.50–1.10)
Glucose, Bld: 96 mg/dL (ref 70–99)
Potassium: 3.6 mEq/L (ref 3.5–5.3)
Sodium: 139 mEq/L (ref 135–145)

## 2011-12-13 LAB — CBC
HCT: 37.3 % (ref 36.0–46.0)
Hemoglobin: 12.1 g/dL (ref 12.0–15.0)
MCH: 28.7 pg (ref 26.0–34.0)
MCHC: 32.4 g/dL (ref 30.0–36.0)
MCV: 88.4 fL (ref 78.0–100.0)
Platelets: 429 10*3/uL — ABNORMAL HIGH (ref 150–400)
RBC: 4.22 MIL/uL (ref 3.87–5.11)
RDW: 13.1 % (ref 11.5–15.5)
WBC: 7 10*3/uL (ref 4.0–10.5)

## 2011-12-13 MED ORDER — METHYLPHENIDATE HCL 20 MG PO TABS: 20.0000 mg | ORAL_TABLET | Freq: Two times a day (BID) | ORAL | Status: DC

## 2011-12-13 NOTE — Progress Notes (Signed)
  Subjective:    Patient ID: Tracy Weiss, female    DOB: Sep 05, 1965, 46 y.o.   MRN: [id number]  HPI Pt that comes today complaining of two episodes of blackouts in a-week period. There is no prodrome and the people surround her have assisted preventing her from falling. The first episode at school was while she was in sitting position and it is described as "everything went black". She woke up and saw her friends around her. She was taken home and slept from 800pm that night to noon the next day, unusual for her since she is a morning person. There is no description of jerking movents, no urinary or fecal incontinence during this episode. The next episode occurred at church when she was standing up. Again, the people next to her noticed it and assisted her to be seated. She does not remember anything else until she wake up from it. The time of duration of both events are not specified. She reports mild tenderness on her right side of her forehead, no changes in vision in the phase of recovering, also describes that she feels like "out of place" not very sharp on her thoughts and difficulty remembering things. She also mention has felt palpitations and she is not under a source of stress currently. No episodes of lightheadedness or dizziness.  Her second complaint for today is difficulty sleeping. She has noticed that she feels sleepy during the day, unusual on her. At night she wakes up multiple times gasping for air. Her snoring has increased since her weight has increased. She is currently taking her medications with no changes in dose or frequency. No herbal supplements or recreational drugs.  Review of Systems  Constitutional: Positive for activity change and fatigue. Negative for fever and chills.  HENT: Negative for hearing loss, rhinorrhea and neck pain.   Eyes: Negative.  Negative for visual disturbance.  Respiratory: Negative.   Cardiovascular: Positive for palpitations. Negative for chest pain and  leg swelling.  Gastrointestinal: Negative.   Genitourinary: Negative.   Musculoskeletal: Negative.   Neurological: Positive for syncope and headaches. Negative for dizziness, tremors, speech difficulty, weakness, light-headedness and numbness.  Psychiatric/Behavioral: Positive for decreased concentration. Negative for confusion and agitation.       Objective:   Physical Exam  Constitutional: She is oriented to person, place, and time. She appears well-developed. No distress.  HENT:  Head: Normocephalic.  Right Ear: External ear normal.  Left Ear: External ear normal.  Mouth/Throat: Oropharynx is clear and moist.  Eyes: Conjunctivae and EOM are normal. Pupils are equal, round, and reactive to light.  Neck: Normal range of motion. Neck supple. No thyromegaly present.  Cardiovascular: Normal rate, regular rhythm, normal heart sounds and intact distal pulses.  Exam reveals no gallop and no friction rub.   No murmur heard. Pulmonary/Chest: Effort normal and breath sounds normal.  Abdominal: Soft. Bowel sounds are normal.  Musculoskeletal: She exhibits no edema and no tenderness.  Lymphadenopathy:    She has no cervical adenopathy.  Neurological: She is alert and oriented to person, place, and time. She has normal reflexes. No cranial nerve deficit. She exhibits normal muscle tone. Coordination normal.       No neurologic motor or sensory focalization. Gait normal.  Psychiatric: She has a normal mood and affect. Her behavior is normal. Judgment and thought content normal.          Assessment & Plan:

## 2011-12-13 NOTE — Assessment & Plan Note (Signed)
Methylphenidate prescription for 1 month given since no QT prolongation or other arrhythmias on ekg.

## 2011-12-13 NOTE — Assessment & Plan Note (Signed)
We can not rule out the possibility that this can contribute to above problem since is not clear the etiology of her "syncopal" episodes. Plan: Sleep studies. Sleep hygiene discussed. Weight reduction. Pt made appointment already with nutrition and she is awaiting to get seen. F/u after studies are completed

## 2011-12-13 NOTE — Assessment & Plan Note (Signed)
Two episodes of syncope witnessed by friends. No prodrome,  No specific length of duration/characteristic of the episode itself. No reported sphincter in continence or movements. Loss of muscular tone but with time for assistance. Not clear id postictal but somnolence described after the events. Short  term memory affected by pt's perception. MMSE intact. No new medication. CBG after the events on the 80's. Reported palpitations. Normal sinus rhythm by physical exam and ekg.  Orthostatics in or office today WNL, EKG normal (no QT prolongations or arrythmias, normal morphology of ekg waves) Causes of syncope, first r/o vasovagal, orthostatic, ventricular arrhythmias. Second r/o neurologic: seizure (complex, grand mal, petit mal) Plan: Avoid driving until diagnosis has been established. Aware friends and family and ask for details of duration or if presence of movements. Record event if possible.  CBC and BMET to check for metabolic contributors. Reevaluation in two weeks or sooner if event repeats. Possible holter monitoring

## 2011-12-13 NOTE — Patient Instructions (Addendum)
Do not drive until diagnosis is clear. Get  Medically evaluated right away if this episodes last more than 1 min or if you have body movements associated with it or If you present chest pain or chest discomfort.  In general get evaluated if your condition worsens or new symptoms appear. Make a f/u appointment in 2 weeks or sooner if symptoms recur. Ask people who assisted you and get more information about this episodes.  I will call you if lab results are abnormal.

## 2011-12-27 ENCOUNTER — Ambulatory Visit: Payer: Self-pay | Admitting: Family Medicine

## 2012-01-14 ENCOUNTER — Ambulatory Visit: Payer: Self-pay | Admitting: Family Medicine

## 2012-01-16 ENCOUNTER — Ambulatory Visit (HOSPITAL_BASED_OUTPATIENT_CLINIC_OR_DEPARTMENT_OTHER): Payer: Self-pay | Attending: Family Medicine | Admitting: General Practice

## 2012-01-16 VITALS — Ht 60.0 in | Wt 190.0 lb

## 2012-01-16 DIAGNOSIS — G4730 Sleep apnea, unspecified: Secondary | ICD-10-CM

## 2012-01-16 DIAGNOSIS — G4710 Hypersomnia, unspecified: Secondary | ICD-10-CM | POA: Insufficient documentation

## 2012-01-16 DIAGNOSIS — G4733 Obstructive sleep apnea (adult) (pediatric): Secondary | ICD-10-CM

## 2012-01-26 DIAGNOSIS — G4730 Sleep apnea, unspecified: Secondary | ICD-10-CM

## 2012-01-26 DIAGNOSIS — G4710 Hypersomnia, unspecified: Secondary | ICD-10-CM

## 2012-01-26 NOTE — Procedures (Signed)
Tracy Weiss, Tracy Weiss                   ACCOUNT NO.:  [id number]  MEDICAL RECORD NO.:  [id number]          PATIENT TYPE:  OUT  LOCATION:  SLEEP CENTER                 FACILITY:  [facility]  PHYSICIAN:  Georgia Baria D. Maple Hudson, MD, FCCP, FACPDATE OF BIRTH:  10[birth year]04[birth year]  DATE OF STUDY:  01/16/2012                           NOCTURNAL POLYSOMNOGRAM  REFERRING PHYSICIAN:  Wayne A. Sheffield Slider, M.D.  INDICATION FOR STUDY:  Hypersomnia with sleep apnea.  EPWORTH SLEEPINESS SCORE:  16/24.  BMI 37.1.  Weight 190 pounds.  Height 60 inches.  Neck 14.5 inches.  MEDICATIONS:  Home medications charted and reviewed.  SLEEP ARCHITECTURE:  Total sleep time 387 minutes with sleep efficiency 87.9%.  Stage I was 4%, stage II 81.5%,  stage III absent, REM 14.5% of total sleep time.  Sleep latency 44.5 minutes, REM latency 90 minutes. Awake after sleep onset 9 minutes.  Arousal index 7.8.  Bedtime medication:  None.  RESPIRATORY DATA:  Apnea-hypopnea index (AHI) 0.9 per hour.  A total of 6 events was scored including 1 obstructive apnea and 5 hypopneas. Events were not positional.  REM AHI 5.4.  There were insufficient numbers of events to permit application of split protocol CPAP titration on this study night.  OXYGEN DATA:  Moderately loud snoring with oxygen desaturation to a nadir of 77% with mean oxygen saturation through the study of 96.3% on room air.  During the total recording time 9.2 minutes were recorded with saturation less than 90% and 8.6 minutes recorded with saturation less than 88% on room air.  CARDIAC DATA:  Sinus rhythm with rare PVC.  MOVEMENT-PARASOMNIA:  A few limb jerks were noted with insignificant effect on sleep.  No bathroom trips.  IMPRESSIONS-RECOMMENDATIONS: 1. Unremarkable sleep architecture for sleep center environment. 2. Rare respiratory event with sleep disturbance, within normal     limits.  AHI 0.9 per hour (the normal range for adults is from 0-5     events per hour).   Moderately loud snoring with oxygen desaturation     to a nadir of 77% and mean oxygen saturation through the study of     96.3% on room air. 3. During the total recording time, which included some time awake, 9.2     minutes were recorded with room air     saturation less than 90% and 8.6 minutes recorded with saturation     less than 88%.  Consider if room air overnight oximetry in the home     environment would be useful in assessing this patient.     [name], MD, [facility], FACP Diplomate, American Board of Sleep Medicine    CDY/MEDQ  D:  01/26/2012 14:13:45  T:  01/26/2012 14:40:12  Job:  [id number]

## 2012-02-07 ENCOUNTER — Other Ambulatory Visit: Payer: Self-pay | Admitting: Family Medicine

## 2012-02-07 ENCOUNTER — Telehealth: Payer: Self-pay | Admitting: Family Medicine

## 2012-02-07 DIAGNOSIS — K029 Dental caries, unspecified: Secondary | ICD-10-CM

## 2012-02-07 NOTE — Telephone Encounter (Signed)
Is asking how long it will be about the dental referral  Has been waiting a very long time

## 2012-02-07 NOTE — Telephone Encounter (Signed)
Spoke with patient about this referral and informed her that it referral is being faxed to [facility]. She is also aware that because she has orange card this process may not be quick. Patient understands and agrees to this

## 2012-02-21 ENCOUNTER — Other Ambulatory Visit: Payer: Self-pay | Admitting: Family Medicine

## 2012-02-21 DIAGNOSIS — I10 Essential (primary) hypertension: Secondary | ICD-10-CM

## 2012-02-21 MED ORDER — OLMESARTAN MEDOXOMIL-HCTZ 40-25 MG PO TABS: 1.0000 | ORAL_TABLET | Freq: Every day | ORAL | Status: DC

## 2012-04-02 ENCOUNTER — Telehealth: Payer: Self-pay | Admitting: Family Medicine

## 2012-04-02 NOTE — Telephone Encounter (Signed)
Patient calls complaining of some numbness and tingling in a small area below her bottom lip on the left side of her face that has been present on and off for the last 2 days. She has no other symptoms. She has no weakness, no visual changes, no facial droop, no problems speaking, and no numbness elsewhere. I advised the patient that if her symptoms were to get significantly worse she come to the emergency room but that most likely she should be seen in our clinic by the end of the week. I told her that her symptoms did not sound concerning to me but that it would be best for her to be seen to set her mind at ease.

## 2012-04-08 ENCOUNTER — Ambulatory Visit (INDEPENDENT_AMBULATORY_CARE_PROVIDER_SITE_OTHER): Payer: Self-pay | Admitting: Family Medicine

## 2012-04-08 ENCOUNTER — Encounter: Payer: Self-pay | Admitting: Family Medicine

## 2012-04-08 VITALS — BP 127/86 | HR 90 | Temp 98.6°F | Ht 61.0 in | Wt 191.9 lb

## 2012-04-08 DIAGNOSIS — E669 Obesity, unspecified: Secondary | ICD-10-CM

## 2012-04-08 DIAGNOSIS — I10 Essential (primary) hypertension: Secondary | ICD-10-CM

## 2012-04-08 DIAGNOSIS — N649 Disorder of breast, unspecified: Secondary | ICD-10-CM

## 2012-04-08 DIAGNOSIS — N6489 Other specified disorders of breast: Secondary | ICD-10-CM

## 2012-04-08 DIAGNOSIS — E785 Hyperlipidemia, unspecified: Secondary | ICD-10-CM

## 2012-04-08 DIAGNOSIS — F988 Other specified behavioral and emotional disorders with onset usually occurring in childhood and adolescence: Secondary | ICD-10-CM

## 2012-04-08 DIAGNOSIS — E119 Type 2 diabetes mellitus without complications: Secondary | ICD-10-CM

## 2012-04-08 DIAGNOSIS — G478 Other sleep disorders: Secondary | ICD-10-CM

## 2012-04-08 DIAGNOSIS — G4730 Sleep apnea, unspecified: Secondary | ICD-10-CM

## 2012-04-08 LAB — POCT GLYCOSYLATED HEMOGLOBIN (HGB A1C): Hemoglobin A1C: 7.8

## 2012-04-09 ENCOUNTER — Other Ambulatory Visit: Payer: Self-pay | Admitting: Family Medicine

## 2012-04-09 ENCOUNTER — Telehealth: Payer: Self-pay | Admitting: *Deleted

## 2012-04-09 ENCOUNTER — Encounter: Payer: Self-pay | Admitting: Family Medicine

## 2012-04-09 DIAGNOSIS — F988 Other specified behavioral and emotional disorders with onset usually occurring in childhood and adolescence: Secondary | ICD-10-CM

## 2012-04-09 DIAGNOSIS — L80 Vitiligo: Secondary | ICD-10-CM | POA: Insufficient documentation

## 2012-04-09 MED ORDER — METHYLPHENIDATE HCL 20 MG PO TABS: 20.0000 mg | ORAL_TABLET | Freq: Two times a day (BID) | ORAL | Status: DC

## 2012-04-09 MED ORDER — TERBINAFINE HCL 1 % EX CREA: TOPICAL_CREAM | Freq: Two times a day (BID) | CUTANEOUS | Status: DC

## 2012-04-09 MED ORDER — METFORMIN HCL 1000 MG PO TABS: 1000.0000 mg | ORAL_TABLET | Freq: Two times a day (BID) | ORAL | Status: DC

## 2012-04-09 MED ORDER — METRONIDAZOLE 0.75 % VA GEL: 1.0000 | Freq: Two times a day (BID) | VAGINAL | Status: DC

## 2012-04-09 MED ORDER — METFORMIN HCL 500 MG PO TABS: 1000.0000 mg | ORAL_TABLET | Freq: Two times a day (BID) | ORAL | Status: DC

## 2012-04-09 NOTE — Assessment & Plan Note (Signed)
BP at goal. No change in treatment.

## 2012-04-09 NOTE — Assessment & Plan Note (Signed)
Pt is DM. Goal is under 100. She is actively doing exercise and has been changing her diet to more healthy choices. Plan Repeat lipid profile. If no change or worse LDL will discuss with pt possibility of start statin treatment.

## 2012-04-09 NOTE — Assessment & Plan Note (Signed)
Sleep studies negative for sleep apnea.

## 2012-04-09 NOTE — Assessment & Plan Note (Signed)
Pt has been on Ritalin 20 mg daily since 2011. No follow up with Psychiatry since that time. Pt reports problems with short term memory. MMSE is 30/30. Plan: Referral to psychiatry to follow up, evaluate efficacy of treatment and adjust medication if needed.

## 2012-04-09 NOTE — Assessment & Plan Note (Signed)
Discoloration on the upper aspect of left nipple. The discoloration is in one to two tones down of pt's nipple skin color. Edges are very demarcated, no erythema, no scaling, no pruritus, no discharge, no nipple retraction, no masses. Last mammography this year negative for malignancy. Woods lamp revealed bluish-greenish borders. Possible fungal infection (?).  Plan: Will treat with topical Terbinafine for 2 weeks and reevaluate. If no changes dermatology consult will be needed.

## 2012-04-09 NOTE — Progress Notes (Signed)
  Subjective:    Patient ID: Tracy Weiss, female    DOB: August 10, 1966, 46 y.o.   MRN: [id number]  HPI Pt comes today with multiple complaints. 1. Nipple discoloration: Noticed last weekend, unilateral on left breast. Not sure how long its has been there. Does not bother her, no pruritus, no swelling, no scaling, no redness. No nipple discharge. 2. DM: on metformin and insulin. Compliant. No side effect noticed. Pt very committed to lose weight. Reports to be on en exercise program and compliant with diabetes diet. 3. Needs medication refill for her Ritalin: last psychiatry consult was in 2011. She takes medication only during school year. No side effects that she notices but pt concerned about recent difficulty with short term memory. She is to the point that needs a letter for school disability's office stating that is ok for her to use recording devices during class. Without recording pt is unsure of new information given. 4. Obesity: pt very disappointed that her diet and exercise regimen is  Not heading to the right direction since she is gaining weight. Today her weight is the highest she is been. She asks for other possibilities as causes of her Metabolic Syndrome. Would like to consider other options to lose weight. 5. No other syncope-like events since the last described on prior visit.  Review of Systems  Constitutional: Positive for unexpected weight change (increase weight despite diet and exercise regimen.). Negative for appetite change and fatigue.  HENT: Negative.   Eyes: Negative.   Respiratory: Negative.   Cardiovascular: Negative for palpitations and leg swelling.  Genitourinary: Negative.   Psychiatric/Behavioral: Positive for confusion, disturbed wake/sleep cycle and decreased concentration. Negative for suicidal ideas, hallucinations, behavioral problems and agitation. The patient is nervous/anxious.        Objective:   Physical Exam  Constitutional: She is oriented to person,  place, and time. No distress.  HENT:  Mouth/Throat: Oropharynx is clear and moist.  Eyes: EOM are normal. Pupils are equal, round, and reactive to light.  Neck: Normal range of motion. Neck supple. No thyromegaly present.  Cardiovascular: Normal rate, regular rhythm and normal heart sounds.   Pulmonary/Chest: Effort normal and breath sounds normal.  Abdominal: Soft. Bowel sounds are normal. She exhibits no mass.  Musculoskeletal: Normal range of motion. She exhibits no edema.  Lymphadenopathy:    She has no cervical adenopathy.  Neurological: She is alert and oriented to person, place, and time.       No neurologic focalization.  Psychiatric: She has a normal mood and affect. Her behavior is normal. Judgment and thought content normal.       Assessment & Plan:

## 2012-04-09 NOTE — Assessment & Plan Note (Signed)
Pt concerned since she is dieting and doing fair amount of daily exercise in the gym and weight does not seem to change. Also DM, HTN, Hyperlipidemia with elevated TG . Pt meets criteria for Metabolic Syndrome diagnosis. Her weight keep increasing regardless reported life style changes made. Today is 191.9 lb the highest pt has been since 2010. Plan: Will obtain set of labs to evaluate other causes of obesity (endocrine) Result will guide [name] to therapy.

## 2012-04-09 NOTE — Assessment & Plan Note (Signed)
A1C today 7.8. Goal under 7. Pt has A1C up to 9 in the past. On Lantus 80 units BID. Metformin 500 mg BID Plan: Will increase Metformin 1000 mg BID. Recheck A1C in 3 months.

## 2012-04-09 NOTE — Telephone Encounter (Signed)
Called and LMOVM for pt to come p/u RX up front.Tracy Weiss [place]

## 2012-04-09 NOTE — Patient Instructions (Addendum)
1800 Calorie Diabetic Diet  The 1800 calorie diabetic diet is designed for eating up to 1800 calories each day. Following this diet and making healthy meal choices can help improve overall health. It controls blood glucose (sugar) levels, and it can also help lower blood pressure and cholesterol.  SERVING SIZES  Measuring foods and serving sizes helps to make sure you are getting the right amount of food. The list below tells how big or small some common serving sizes are:   1 oz.........4 stacked dice.    3 oz.........Deck of cards.    1 tsp........Tip of little finger.    1 tbs........Thumb.    2 tbs........Golf ball.     cup.......Half of a fist.    1 cup........A fist.   GUIDELINES FOR CHOOSING FOODS  The goal of this diet is to eat a variety of foods and limit calories to 1800 each day. This can be done by choosing foods that are low in calories and fat. The diet also suggests eating small amounts of food frequently. Doing this helps control your blood glucose levels so they do not get too high or too low. Each meal or snack may include a protein food source to help you feel more satisfied. Try to eat about the same amount of food around the same time each day. This includes weekend days, travel days, and days off work. Space your meals about 4 to 5 hours apart, and add a snack between them, if you wish.    For example, a daily food plan could include breakfast, a morning snack, lunch, dinner, and an evening snack. Healthy meals and snacks have different types of foods, including whole grains, vegetables, fruits, lean meats, poultry, fish, and dairy products. As you plan your meals, select a variety of foods. Choose from the bread and starch, vegetable, fruit, dairy, and meat/protein groups. Examples of foods from each group are listed below with their suggested serving sizes. Use measuring cups and spoons to become familiar with what a healthy portion looks like.  Bread and Starch   Each serving equals 15 grams of carbohydrates.   1 slice bread.     bagel.     cup cold cereal (unsweetened).     cup hot cereal or mashed potatoes.    1 small potato (size of a computer mouse).    ? cup cooked pasta or rice.     English muffin.    1 cup broth-based soup.    3 cups of popcorn.    4 to 6 whole-wheat crackers.     cup cooked beans, peas, or corn.   Vegetables  Each serving equals 5 grams of carbohydrates.    cup cooked vegetables.    1 cup raw vegetables.     cup tomato or vegetable juice.   Fruit  Each serving equals 15 grams of carbohydrates.   1 small apple or orange.    1  cup watermelon or strawberries.     cup applesauce (no sugar added).    2 tbs raisins.     banana.     cup canned fruit, packed in water or in its own juice.     cup unsweetened fruit juice.   Dairy  Each serving equals 12 to 15 grams of carbohydrates.   1 cup fat-free milk.    6 oz artificially sweetened yogurt or plain yogurt.    1 cup low-fat buttermilk.    1 cup soy milk.    1 cup   almond milk.   Meat/Protein   1 large egg.    2 to 3 oz meat, poultry, or fish.     cup low-fat cottage cheese.    1 tbs peanut butter.    1 oz low-fat cheese.     cup tuna, packed in water.     cup tofu.   Fat   1 tsp oil.    1 tsp trans-fat-free margarine.    1 tsp butter.    1 tsp mayonnaise.    2 tbs avocado.    1 tbs salad dressing.    1 tbs cream cheese.    2 tbs sour cream.   SAMPLE 1800 CALORIE DIET PLAN  Breakfast    cup unsweetened cereal (1 carb serving).    1 cup fat-free milk (1 carb serving).    1 slice whole-wheat toast (1 carb serving).     small banana (1 carb serving).    1 scrambled egg.    1 tsp trans-fat-free margarine.   Lunch   Tuna sandwich.    2 slices whole-wheat bread (2 carb servings).     cup canned tuna in water, drained.    1 tbs reduced fat mayonnaise.    1 stalk celery, chopped.    2 slices tomato.    1 lettuce leaf.    1 cup carrot sticks.     24 to 30 seedless grapes (2 carb servings).    6 oz light yogurt (1 carb serving).   Afternoon Snack   3 graham cracker squares (1 carb serving).    1 cup fat-free milk (1 carb serving).    1 tbs peanut butter.   Dinner   3 oz salmon, broiled with 1 tsp oil.    1 cup mashed potatoes (2 carb servings) with 1 tsp trans-fat-free margarine.    1 cup fresh or frozen green beans.    1 cup steamed asparagus.    1 cup fat-free milk (1 carb serving).   Evening Snack   3 cups of air-popped popcorn (1 carb serving).    2 tbs Parmesan cheese.   Meal Plan  You can use this worksheet to help you make a daily meal plan based on the 1800 calorie diabetic diet suggestions. If you are using this plan to help you control your blood glucose, you may interchange carbohydrate-containing foods (dairy, starches, and fruits). Select a variety of fresh foods of varying colors and flavors. The total amount of carbohydrate in your meals or snacks is more important than making sure you include all of the food groups every time you eat. Choose from the approximate amount of the following foods to build your day's meals:   8 Starches.    4 Vegetables.    3 Fruits.    2 Dairy.    6 to 7 oz Meat/Protein.    Up to 4 Fats.   Your dietician can use this worksheet to help you decide how many servings and which types of foods are right for you.  BREAKFAST  Food Group and Servings / Food Choice  Starches _______________________________________________________  Dairy __________________________________________________________  Fruit ___________________________________________________________  Meat/Protein ____________________________________________________  Fat ____________________________________________________________  LUNCH  Food Group and Servings / Food Choice  Starch _________________________________________________________  Meat/Protein ___________________________________________________   Vegetables _____________________________________________________  Fruit __________________________________________________________  Dairy __________________________________________________________  Fat ____________________________________________________________  AFTERNOON SNACK  Food Group and Servings / Food Choice  Starch ________________________________________________________  Meat/Protein ___________________________________________________  Fruit __________________________________________________________  Dairy __________________________________________________________  DINNER  Food

## 2012-04-10 ENCOUNTER — Telehealth: Payer: Self-pay | Admitting: *Deleted

## 2012-04-10 NOTE — Telephone Encounter (Signed)
Received fax from pharmacy questioning quaniity given of the metformin on the RX sent in yesterday. Directions states metformin 500 mg two tabs two times daily. Quaniity given was # 60. Will ask Dr. Aviva Signs to clarify. Please resend.

## 2012-04-18 ENCOUNTER — Encounter: Payer: Self-pay | Admitting: Family Medicine

## 2012-04-18 ENCOUNTER — Telehealth: Payer: Self-pay | Admitting: Family Medicine

## 2012-04-18 NOTE — Telephone Encounter (Signed)
Faxed out

## 2012-04-18 NOTE — Telephone Encounter (Signed)
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  The school needs this by next Tuesday.  Attention: [place] fax # [phone]

## 2012-04-25 ENCOUNTER — Telehealth: Payer: Self-pay | Admitting: Family Medicine

## 2012-04-25 NOTE — Telephone Encounter (Signed)
Letter written to signify change of needing a note taker in addition to recorder note faxed to Attention: [place] fax # [phone]   .Loralee [place]

## 2012-04-25 NOTE — Telephone Encounter (Signed)
The patient is calling about the letter that was sent about needing a recorder to assist her but she also needs it to say that she needs a note taker to assist her as well.  She needs this before Wednesday next week.

## 2012-05-30 ENCOUNTER — Telehealth: Payer: Self-pay | Admitting: Family Medicine

## 2012-05-30 NOTE — Telephone Encounter (Signed)
Spoke to pt by phone who states she has started experiencing intermittent numbness in arms and legs. TBS in the am has been around 130. Pt has significantly increased her exercise regimen and altered her diet over the past 6 days. Advised to pay close attention to [facility] throughout the day checking before and after exercise and before and after naps during the day. Discussed warning signs of hypo and hyperglycemia and advised to come to ED if necessary. Denies CP, SOB, syncope, Dizziness, HA. Pt to f/u w/ PCP on 06/11/12

## 2012-06-11 ENCOUNTER — Ambulatory Visit: Payer: Self-pay | Admitting: Family Medicine

## 2012-06-11 ENCOUNTER — Other Ambulatory Visit: Payer: Self-pay

## 2012-06-11 DIAGNOSIS — E669 Obesity, unspecified: Secondary | ICD-10-CM

## 2012-06-11 DIAGNOSIS — Z7251 High risk heterosexual behavior: Secondary | ICD-10-CM

## 2012-06-11 LAB — LIPID PANEL
Cholesterol: 170 mg/dL (ref 0–200)
HDL: 39 mg/dL — ABNORMAL LOW (ref >39)
LDL Cholesterol: 106 mg/dL — ABNORMAL HIGH (ref 0–99)
Total CHOL/HDL Ratio: 4.4 Ratio
Triglycerides: 123 mg/dL (ref <150)
VLDL: 25 mg/dL (ref 0–40)

## 2012-06-11 LAB — HIV ANTIBODY (ROUTINE TESTING W REFLEX): HIV: NONREACTIVE

## 2012-06-11 LAB — TSH: TSH: 1.033 u[IU]/mL (ref 0.350–4.500)

## 2012-06-11 LAB — HEPATITIS C ANTIBODY, REFLEX: HCV Ab: NEGATIVE

## 2012-06-11 NOTE — Progress Notes (Signed)
HIV,HEP C Ab,TSH,TESTOSTERONE,PROLACTIN,FSH AND FLP DONE TODAY [facility] [place]

## 2012-06-12 LAB — PROLACTIN: Prolactin: 10.3 ng/mL

## 2012-06-12 LAB — TESTOSTERONE, FREE, TOTAL, SHBG
Sex Hormone Binding: 23 nmol/L (ref 18–114)
Testosterone, Free: 7.3 pg/mL — ABNORMAL HIGH (ref 0.6–6.8)
Testosterone-% Free: 2.2 % (ref 0.4–2.4)
Testosterone: 33.33 ng/dL (ref 10–70)

## 2012-06-12 LAB — FOLLICLE STIMULATING HORMONE: FSH: 3.4 m[IU]/mL

## 2012-06-25 ENCOUNTER — Ambulatory Visit (INDEPENDENT_AMBULATORY_CARE_PROVIDER_SITE_OTHER): Payer: Self-pay | Admitting: Family Medicine

## 2012-06-25 ENCOUNTER — Encounter: Payer: Self-pay | Admitting: Family Medicine

## 2012-06-25 VITALS — BP 122/78 | HR 92 | Temp 98.6°F | Ht 61.0 in | Wt 193.4 lb

## 2012-06-25 DIAGNOSIS — E282 Polycystic ovarian syndrome: Secondary | ICD-10-CM | POA: Insufficient documentation

## 2012-06-25 DIAGNOSIS — E669 Obesity, unspecified: Secondary | ICD-10-CM

## 2012-06-25 DIAGNOSIS — N649 Disorder of breast, unspecified: Secondary | ICD-10-CM

## 2012-06-25 DIAGNOSIS — E785 Hyperlipidemia, unspecified: Secondary | ICD-10-CM

## 2012-06-25 DIAGNOSIS — N6489 Other specified disorders of breast: Secondary | ICD-10-CM

## 2012-06-25 HISTORY — DX: Polycystic ovarian syndrome: E28.2

## 2012-06-25 MED ORDER — PRAVASTATIN SODIUM 10 MG PO TABS: 10.0000 mg | ORAL_TABLET | Freq: Every day | ORAL | Status: DC

## 2012-06-25 NOTE — Patient Instructions (Addendum)
Polycystic Ovarian Syndrome Polycystic ovarian syndrome is a condition with a number of problems. One problem is with the ovaries. The ovaries are organs located in the female pelvis, on each side of the uterus. Usually, during the menstrual cycle, an egg is released from 1 ovary every month. This is called ovulation. When the egg is fertilized, it goes into the womb (uterus), which allows for the growth of a baby. The egg travels from the ovary through the fallopian tube to the uterus. The ovaries also make the hormones estrogen and progesterone. These hormones help the development of a woman's breasts, body shape, and body hair. They also regulate the menstrual cycle and pregnancy. Sometimes, cysts form in the ovaries. A cyst is a fluid-filled sac. On the ovary, different types of cysts can form. The most common type of ovarian cyst is called a functional or ovulation cyst. It is normal, and often forms during the normal menstrual cycle. Each month, a woman's ovaries grow tiny cysts that hold the eggs. When an egg is fully grown, the sac breaks open. This releases the egg. Then, the sac which released the egg from the ovary dissolves. In one type of functional cyst, called a follicle cyst, the sac does not break open to release the egg. It may actually continue to grow. This type of cyst usually disappears within 1 to 3 months.  One type of cyst problem with the ovaries is called Polycystic Ovarian Syndrome (PCOS). In this condition, many follicle cysts form, but do not rupture and produce an egg. This health problem can affect the following:  Menstrual cycle.  Heart.  Obesity.  Cancer of the uterus.  Fertility.  Blood vessels.  Hair growth (face and body) or baldness.  Hormones.  Appearance.  High blood pressure.  Stroke.  Insulin production.  Inflammation of the liver.  Elevated blood cholesterol and triglycerides. CAUSES   No one knows the exact cause of PCOS.  Women with  PCOS often have a mother or sister with PCOS. There is not yet enough proof to say this is inherited.  Many women with PCOS have a weight problem.  Researchers are looking at the relationship between PCOS and the body's ability to make insulin. Insulin is a hormone that regulates the change of sugar, starches, and other food into energy for the body's use, or for storage. Some women with PCOS make too much insulin. It is possible that the ovaries react by making too many female hormones, called androgens. This can lead to acne, excessive hair growth, weight gain, and ovulation problems.  Too much production of luteinizing hormone (LH) from the pituitary gland in the brain stimulates the ovary to produce too much female hormone (androgen). SYMPTOMS   Infrequent or no menstrual periods, and/or irregular bleeding.  Inability to get pregnant (infertility), because of not ovulating.  Increased growth of hair on the face, chest, stomach, back, thumbs, thighs, or toes.  Acne, oily skin, or dandruff.  Pelvic pain.  Weight gain or obesity, usually carrying extra weight around the waist.  Type 2 diabetes (this is the diabetes that usually does not need insulin).  High cholesterol.  High blood pressure.  Female-pattern baldness or thinning hair.  Patches of thickened and dark brown or black skin on the neck, arms, breasts, or thighs.  Skin tags, or tiny excess flaps of skin, in the armpits or neck area.  Sleep apnea (excessive snoring and breathing stops at times while asleep).  Deepening of the voice.    Gestational diabetes when pregnant.  Increased risk of miscarriage with pregnancy. DIAGNOSIS  There is no single test to diagnose PCOS.   Your caregiver will:  Take a medical history.  Perform a pelvic exam.  Perform an ultrasound.  Check your female and female hormone levels.  Measure glucose or sugar levels in the blood.  Do other blood tests.  If you are producing too many  female hormones, your caregiver will make sure it is from PCOS. At the physical exam, your caregiver will want to evaluate the areas of increased hair growth. Try to allow natural hair growth for a few days before the visit.  During a pelvic exam, the ovaries may be enlarged or swollen by the increased number of small cysts. This can be seen more easily by vaginal ultrasound or screening, to examine the ovaries and lining of the uterus (endometrium) for cysts. The uterine lining may become thicker, if there has not been a regular period. TREATMENT  Because there is no cure for PCOS, it needs to be managed to prevent problems. Treatments are based on your symptoms. Treatment is also based on whether you want to have a baby or whether you need contraception.  Treatment may include:  Progesterone hormone, to start a menstrual period.  Birth control pills, to make you have regular menstrual periods.  Medicines to make you ovulate, if you want to get pregnant.  Medicines to control your insulin.  Medicine to control your blood pressure.  Medicine and diet, to control your high cholesterol and triglycerides in your blood.  Surgery, making small holes in the ovary, to decrease the amount of female hormone production. This is done through a long, lighted tube (laparoscope), placed into the pelvis through a tiny incision in the lower abdomen. Your caregiver will go over some of the choices with you. WOMEN WITH PCOS HAVE THESE CHARACTERISTICS:  High levels of female hormones called androgens.  An irregular or no menstrual cycle.  May have many small cysts in their ovaries. PCOS is the most common hormonal reproductive problem in women of childbearing age. WHY DO WOMEN WITH PCOS HAVE TROUBLE WITH THEIR MENSTRUAL CYCLE? Each month, about 20 eggs start to mature in the ovaries. As one egg grows and matures, the follicle breaks open to release the egg, so it can travel through the fallopian tube for  fertilization. When the single egg leaves the follicle, ovulation takes place. In women with PCOS, the ovary does not make all of the hormones it needs for any of the eggs to fully mature. They may start to grow and accumulate fluid, but no one egg becomes large enough. Instead, some may remain as cysts. Since no egg matures or is released, ovulation does not occur and the hormone progesterone is not made. Without progesterone, a woman's menstrual cycle is irregular or absent. Also, the cysts produce female hormones, which continue to prevent ovulation.  Document Released: 12/14/2004 Document Revised: 11/12/2011 Document Reviewed: 07/08/2009 [employer] Patient Information 2013 [employer].  

## 2012-06-25 NOTE — Assessment & Plan Note (Signed)
A/P documented under PCOS.

## 2012-06-25 NOTE — Progress Notes (Signed)
Family Medicine Office Visit Note   Subjective:   Patient ID: Tracy Weiss, female  DOB: 06-11-1966, 46 y.o.. MRN: [id number]   Pt that comes today to f/u recent lab work order after her complaint of unintentional weigh gain.  Obesity: She denies other symptoms but is very concerned about her weight. She exercises on regular bases in a gym, she has been on a strict diet and reports has had evaluation in the past with dietitian but reports no improvement on her condition. She is open to any options she may have to lose weight and recover her healthy state.  DM type II: insulin-dependent on Lantus 80 units BID and SSI Novolog up to 10 units around meals. Reports some time adjusting her Laantus to 50 units BID if glucose has been on the lower side. Also on metformin and reports compliance.  Lesion on breast: f/u hypopigmented area on areola an nipple. Has been using Lamisil cream after prior diagnosis of fungal infection but no changes observed. No pruritus, erythema or extension of lesion. No other areas involved. No nipple discharge. No masses noted by pt.  Review of Systems:  Pt denies SOB, chest pain, palpitations, headaches, dizziness, numbness or weakness. No changes on urinary or BM habits.Positive for unintentional weight gain.  Objective:   Physical Exam: Gen:  NAD HEENT: Moist mucous membranes  CV: Regular rate and rhythm, no murmurs. BREASTS: left nipple area with 2 cm diameter of iregular but well defined area of hypopigmentation. No erythema or raised lesion. No masses on palpation, no nipple discharge. No involvement of other breast or other body areas.  PULM: Clear to auscultation bilaterally.  ABD: Soft, non tender, non distended, normal bowel sounds EXT: No edema Neuro: Alert and oriented x3. No focalization  Assessment & Plan:

## 2012-06-25 NOTE — Assessment & Plan Note (Signed)
No changes since noticed. No other areas involved. Possible vitiligo although infrequent area. Plan: Close observation. Oriented pt to take picture of lesion and observe since minimal changes when progression is slow are difficult to perceive. If changes seen possible dermatology consut for biopsy/eval.

## 2012-06-25 NOTE — Assessment & Plan Note (Signed)
Improvement of TG but LDL with almost no change. Plan: Start Pravachol 10 mg daily and repeat direct LDL in  6 months.

## 2012-06-25 NOTE — Assessment & Plan Note (Addendum)
Pt with DM type II, insulin dependant on high dose that shows insulin resistance. Obesity with BMI 36.54. Hyperlipidemia and Hypertension. (Metabolic Syndrome) with elevated FreeTestosterone per recent tests.  Denies hirsutism.  Plan: Obesity: Since pt is 36.54 BMI with multiple comorbidities and her major issue is obesity we discussed all options and pt would like to be evaluated for Bariatric Surgery. Information given to pt about seminar offer at [facility] as part of the process of BS eval. Insulin resistance: Pt already on max dose of metformin. Hx of irregular menses but Infertility is not a problem since she already has children and is 46 y/o. U/S is not indicated at this time.

## 2012-06-27 ENCOUNTER — Other Ambulatory Visit: Payer: Self-pay | Admitting: Family Medicine

## 2012-06-27 ENCOUNTER — Telehealth: Payer: Self-pay | Admitting: Family Medicine

## 2012-06-27 DIAGNOSIS — E1165 Type 2 diabetes mellitus with hyperglycemia: Secondary | ICD-10-CM

## 2012-06-27 DIAGNOSIS — E119 Type 2 diabetes mellitus without complications: Secondary | ICD-10-CM

## 2012-06-27 DIAGNOSIS — IMO0002 Reserved for concepts with insufficient information to code with codable children: Secondary | ICD-10-CM

## 2012-06-27 MED ORDER — INSULIN GLARGINE 100 UNIT/ML ~~LOC~~ SOLN: 80.0000 [IU] | Freq: Two times a day (BID) | SUBCUTANEOUS | Status: DC

## 2012-06-27 MED ORDER — INSULIN ASPART 100 UNIT/ML ~~LOC~~ SOLN: 10.0000 [IU] | Freq: Three times a day (TID) | SUBCUTANEOUS | Status: DC

## 2012-06-27 NOTE — Telephone Encounter (Addendum)
Dr. Aviva Signs refilled rx and these are faxed to [facility] and have been received per Dawn at [facility].

## 2012-06-27 NOTE — Telephone Encounter (Signed)
Refills on Novolog and Lantus need to be sent to [facility] with [facility], fax # is [phone].  Dawns direct # is [phone].  She has been out of these since August.  Patient says this is urgent to happen before 4:30.

## 2012-07-30 ENCOUNTER — Telehealth: Payer: Self-pay | Admitting: *Deleted

## 2012-07-30 NOTE — Telephone Encounter (Signed)
I need to precept this case. I agree to continue prescribing Ritalin since pt was on an stable dose not needing adjustments or changes. Most likely pt has to go to get her ADHD clinic to get re-evaluated in order to change medication.

## 2012-07-30 NOTE — Telephone Encounter (Signed)
[facility] pharmacy calling because patient just received 20-month supply of Lantus from [facility] program (#14 vials).  Patient's Lantus rx will expire and wants verbal order or rx to approve for 59-month supply.  Ok to give 41-month supply.    Patient at pharmacy and requesting to speak with someone in our office.  Patient takes generic Ritalin for ADD.  Patient has been unable to afford med.  Patient would like to change to Vyvanse because [facility] pharmacy can provide patient with medication assistance.  Wants to know if Dr. Aviva Signs can change rx and write for #30 x 3 (90-day supply) to cover December through February.  Will route request to Dr. Aviva Signs and call patient back.  Gaylene Brooks, RN

## 2012-08-11 ENCOUNTER — Encounter: Payer: Self-pay | Admitting: Family Medicine

## 2012-08-11 ENCOUNTER — Ambulatory Visit (INDEPENDENT_AMBULATORY_CARE_PROVIDER_SITE_OTHER): Payer: Self-pay | Admitting: Family Medicine

## 2012-08-11 VITALS — BP 125/71 | HR 81 | Temp 98.1°F | Ht 61.5 in | Wt 192.8 lb

## 2012-08-11 DIAGNOSIS — E119 Type 2 diabetes mellitus without complications: Secondary | ICD-10-CM

## 2012-08-11 DIAGNOSIS — N6489 Other specified disorders of breast: Secondary | ICD-10-CM

## 2012-08-11 DIAGNOSIS — E669 Obesity, unspecified: Secondary | ICD-10-CM

## 2012-08-11 DIAGNOSIS — F988 Other specified behavioral and emotional disorders with onset usually occurring in childhood and adolescence: Secondary | ICD-10-CM

## 2012-08-11 DIAGNOSIS — E785 Hyperlipidemia, unspecified: Secondary | ICD-10-CM

## 2012-08-11 DIAGNOSIS — N649 Disorder of breast, unspecified: Secondary | ICD-10-CM

## 2012-08-11 LAB — POCT GLYCOSYLATED HEMOGLOBIN (HGB A1C): Hemoglobin A1C: 7

## 2012-08-11 MED ORDER — METRONIDAZOLE 0.75 % VA GEL: 1.0000 | Freq: Two times a day (BID) | VAGINAL | Status: DC

## 2012-08-11 NOTE — Assessment & Plan Note (Signed)
Has been seen for this condition in the past. Last time pt was precepted with attending Dr. Leveda Anna and plan was close obs with low threshold for referral to dermatology for biopsy No reported changes but pt very anxious due to hx of maternal early death due to CA. Plan: Referral to dermatology for evaluation and biopsy.

## 2012-08-11 NOTE — Progress Notes (Signed)
Family Medicine Office Visit Note   Subjective:   Patient ID: Tracy Weiss, female  DOB: 1966/05/07, 46 y.o.. MRN: [id number]   Pt that comes today to discuss skin decoloration on left areola and nipple area. She reports the lesion has not changed but she is concerned due to mother's hx of death due to cancer. Denies pain, masses on breast self examination or other concerns.  Pt also went to Bariatric Surgery Seminar since she was considering this treatment due to failure of all her prior attempts to lose weight. (please refer to prior note for more detailed information about pt meeting criteria for Bariatric Procedure) Pt now considering more conservative approach and agreeable to meet with our dietitian to have more tailored diet regimen for weight management with her chronic conditions.   Review of Systems:  Pt denies SOB, chest pain, palpitations, headaches, dizziness, numbness or weakness. No changes on urinary or BM habits. No unintentional weigh loss/gain.  Objective:   Physical Exam: Gen:  NAD Skin: hypopigmented macular lesion on left nipple and areola. About 2-3 cm. borders well defined of irregular shape. No raised or depressed lesion. Breast: normal breast tissue. No masses. Normal nipple anatomy. No axillary adenopathies  Assessment & Plan:

## 2012-08-11 NOTE — Assessment & Plan Note (Signed)
Pt has not been taking Pravachol. Declines this treatment and would like to manage LDL goals with diet. Pt's LDL goal is below 70.

## 2012-08-11 NOTE — Assessment & Plan Note (Signed)
Pt would like to be evaluated by dietitian and give this approach one more time before she decides Bariatric Surgery.

## 2012-08-11 NOTE — Patient Instructions (Signed)
It has been a pleasure to see you today. A dermatology referral has been placed.  Please call and make an appointment with our dietitian. Make your next appointment for f/u in 3-4 months.

## 2012-08-15 ENCOUNTER — Other Ambulatory Visit: Payer: Self-pay | Admitting: Family Medicine

## 2012-08-15 DIAGNOSIS — E669 Obesity, unspecified: Secondary | ICD-10-CM

## 2012-09-15 ENCOUNTER — Telehealth: Payer: Self-pay | Admitting: Family Medicine

## 2012-09-15 ENCOUNTER — Other Ambulatory Visit: Payer: Self-pay | Admitting: Family Medicine

## 2012-09-15 DIAGNOSIS — Z1231 Encounter for screening mammogram for malignant neoplasm of breast: Secondary | ICD-10-CM

## 2012-09-15 NOTE — Telephone Encounter (Signed)
Patient is at her Dermatology appt with Dr. Terri Piedra and has been told that the referral was not placed as an [facility] referral and she said that she needs that corrected.  She said that the nurse needs to call [phone] to have this corrected.

## 2012-09-16 ENCOUNTER — Encounter: Payer: Self-pay | Admitting: Family Medicine

## 2012-09-16 ENCOUNTER — Encounter: Payer: Self-pay | Admitting: *Deleted

## 2012-09-16 ENCOUNTER — Ambulatory Visit (INDEPENDENT_AMBULATORY_CARE_PROVIDER_SITE_OTHER): Payer: No Typology Code available for payment source | Admitting: Family Medicine

## 2012-09-16 VITALS — Ht 61.75 in | Wt 187.4 lb

## 2012-09-16 DIAGNOSIS — E669 Obesity, unspecified: Secondary | ICD-10-CM

## 2012-09-16 DIAGNOSIS — E119 Type 2 diabetes mellitus without complications: Secondary | ICD-10-CM

## 2012-09-16 NOTE — Progress Notes (Signed)
Medical Nutrition Therapy:  Appt start time: 1330 end time:  1430.  Assessment:  Primary concerns today: Blood sugar control.  Tracy Weiss would like to get off her insulin.  She has been working hard at exercising and making good food choices.   Checks BG AM and bedtime.  FBG has been 90-150, but is usually around <110.  She is using Novolog only as needed, and is taking 80 U of Lantus only once a day (AM) usually.   Usual eating pattern includes 3 meals and 0-1 snacks per day. Usual physical activity includes gym 5 X wk (30 min TM walk-jog, ab work and light weights) - usually 45-60 total workout.  [name]'s cousin is her Systems analyst, and he calls her at 5 AM for the workout.   [name] is highly motivated to lose wt and manage her BG well right now.   Everyday foods include water, 24 oz [employer] orange Hi C drink.  Avoided foods include most sweets and breads.    Fasted yesterday for her religion, so recall is from Sunday:  Dietary recall: (Up at 9) B ( AM)-   None (BG was 110):  Went to church, AM & PM Snk ( AM)-   none L (1:45 PM)-  ~4 c salad w/ 2 tbsp dressing, 1 roll, 16 oz sweet tea Snk ( PM)-  none D ( PM)-  none Snk ( PM)-  none Sunday was atypical b/c [name] usually eats three meals a day.  More normal day includes bkfst of 15 almonds and either a [employer] oatmeal or home-cooked oatmeal, and dinner is often chicken, starch, and veg's.  Progress Towards Goal(s):  In progress.   Nutritional Diagnosis:  [address]-2.1 Inpaired nutrition utilization As related to blood glucose control.  As evidenced by history of Type 2 DM.    Intervention:  Nutrition education.  Monitoring/Evaluation:  Dietary intake, exercise, and body weight in 1 month(s).

## 2012-09-16 NOTE — Telephone Encounter (Signed)
This encounter was created in error - please disregard.

## 2012-09-16 NOTE — Telephone Encounter (Signed)
Attempted to call patient about this issues and her mailbox is full. Spoke with Lupita Leash about this and at the time that referral was made her [employer] was expired. Dr. Aviva Signs was aware of this and patient agreed to being self pay ($85 at time of visit) to that visit. If the patient has an active [employer] she needs to bring it in and the process for this referral will have to be done again through [employer] so that it is valid and can be accepted as an [facility] appointment.

## 2012-09-16 NOTE — Patient Instructions (Addendum)
-   [employer] Oatmeal:  32 g added sugar = 8 teaspoons of sugar.  Every teaspoon of sugar = 4 grams of sugar (carb) on the label.  - Carbohydrates come in different forms:  Starch, sugar, and fiber.  Fiber, however, does NOT raise your blood sugar b/c you can't absorb it.    Diet Recommendations for Diabetes   Starchy (carb) foods include: Bread, rice, pasta, potatoes, corn, crackers, bagels, muffins, all baked goods.  (Fruit and milk/yogurt all have sugar, but for now, we will focus on the starchy foods above to limit at each meal.)  Protein foods include: Meat, fish, poultry, eggs, dairy foods, and beans such as pinto and kidney beans (beans also provide carbohydrate).   1. Eat at least 3 meals and 1-2 snacks per day. Never go more than 4-5 hours while awake without eating.  2. Limit starchy foods to TWO per meal and ONE per snack. ONE portion of a starchy  food is equal to the following:   - ONE slice of bread (or its equivalent, such as half of a hamburger bun).   - 1/2 cup of a "scoopable" starchy food such as potatoes or rice.   - 1 OUNCE (28 grams) of starchy snack foods such as crackers or pretzels (look on label).   - 15 grams of carbohydrate as shown on food label.  3. Both lunch and dinner should include a protein food, a carb food, and vegetables.   - Obtain twice as many veg's as protein or carbohydrate foods for both lunch and dinner.   - Try to keep frozen veg's on hand for a quick vegetable serving.     - Fresh or frozen veg's are best.  4. Breakfast should always include protein, i.e., 2 eggs, 1-2 slc wholewheat toast, fruit &/or yogurt.   5. Record what you eat and drink every day, including how much and what time.  6. Record blood glucose levels on the form provided today, and bring to follow-up.    - Email [email or web address] for a follow-up appt in Feb (Mon, Tue, or [place]).

## 2012-09-16 NOTE — Telephone Encounter (Signed)
Attempted to call  patietn back about this and her mailbo

## 2012-09-17 NOTE — Telephone Encounter (Signed)
Faxed OC to [facility] stating that patient's card is valid and that she is an established patient there

## 2012-10-15 ENCOUNTER — Telehealth: Payer: Self-pay | Admitting: Family Medicine

## 2012-10-15 NOTE — Telephone Encounter (Signed)
Needs a refill on her ritalin - pls call when ready

## 2012-10-20 ENCOUNTER — Ambulatory Visit (HOSPITAL_COMMUNITY)
Admission: RE | Admit: 2012-10-20 | Discharge: 2012-10-20 | Disposition: A | Discharge status: Home / Self Care | Payer: No Typology Code available for payment source | Source: Ambulatory Visit | Attending: Internal Medicine | Admitting: Internal Medicine

## 2012-10-20 DIAGNOSIS — Z1231 Encounter for screening mammogram for malignant neoplasm of breast: Secondary | ICD-10-CM | POA: Insufficient documentation

## 2012-10-20 NOTE — Telephone Encounter (Signed)
Pt needs appointment. Ritalin prescription requires office visit.

## 2012-10-20 NOTE — Telephone Encounter (Signed)
Spoke with patient and she stated that she was told during her December visit to call when she needed the prescription just to call back. I informed her of message below but she was not happy and stated that she we must have some type of "amnesia" because we cannot remember what we tell her and she needs this for school

## 2012-10-21 ENCOUNTER — Telehealth: Payer: Self-pay | Admitting: Family Medicine

## 2012-10-21 NOTE — Telephone Encounter (Signed)
Pt is asking to speak with Huntley Dec asap - she is very upset that this is happening and it was supposed to be noted in her chart when she was here in Dec.  That she was told to call when she needs another refill.  The unfortunate thing is that Dr Aviva Signs doesn't have anything available until March 3rd

## 2012-10-21 NOTE — Telephone Encounter (Signed)
Called pt and clarify misunderstanding about prescription refill. She will call tomorrow morning to get a same day-appointment for this matter since she is out of her Ritalin. She can be seen by any doctor of our practice in order to prescribe this meds for a month supply since pt is scheduled with me in March.  She has been stable on her meds, no side effects noted, no need to increase dose.

## 2012-10-21 NOTE — Telephone Encounter (Signed)
Patient will like to speak with Dr. Aviva Signs personally about this today please.

## 2012-10-22 ENCOUNTER — Ambulatory Visit (INDEPENDENT_AMBULATORY_CARE_PROVIDER_SITE_OTHER): Payer: No Typology Code available for payment source | Admitting: Family Medicine

## 2012-10-22 VITALS — BP 122/81 | HR 85 | Ht 61.75 in | Wt 188.0 lb

## 2012-10-22 DIAGNOSIS — F988 Other specified behavioral and emotional disorders with onset usually occurring in childhood and adolescence: Secondary | ICD-10-CM

## 2012-10-22 MED ORDER — METHYLPHENIDATE HCL 20 MG PO TABS: 20.0000 mg | ORAL_TABLET | Freq: Two times a day (BID) | ORAL | Status: DC

## 2012-10-22 NOTE — Assessment & Plan Note (Signed)
Refilled ritalin 20mg  bid 60 tablets no refills.

## 2012-10-22 NOTE — Progress Notes (Signed)
Patient ID: Tracy Weiss    DOB: 1966/06/09, 47 y.o.   MRN: [id number] --- Subjective:  Tracy Weiss is a 47 y.o.female who presents for ritalin 20mg  bid refill. Spoke with her PCP, Dr. Aviva Signs on the phone who ok'ed the refill by provider in [facility].  Patient denies any side effects on medication. Feels like it helps with her concentration with work and school. No significant GI side effects.    ROS: see HPI Past Medical History: reviewed and updated medications and allergies. Social History: Tobacco: former smoker  Objective: Filed Vitals:   10/22/12 0908  BP: 122/81  Pulse: 85    Physical Examination:   General appearance - alert, well appearing, and in no distress Chest - clear to auscultation, no wheezes, rales or rhonchi, symmetric air entry Heart - normal rate, regular rhythm, normal S1, S2, no murmurs, rubs, clicks or gallops

## 2012-10-22 NOTE — Patient Instructions (Addendum)
Follow up with your primary care doctor in 1 month for a refill.  It was nice to meet you!

## 2012-11-28 ENCOUNTER — Telehealth: Payer: Self-pay | Admitting: Family Medicine

## 2012-11-28 NOTE — Telephone Encounter (Signed)
Patient is calling to make sure that the appt with Dr. Terri Piedra - Dermatology -  is going to be covered with her [facility]

## 2012-11-28 NOTE — Telephone Encounter (Signed)
LVM for patient to call back to inform her that the visit will be covered by orange card

## 2012-12-03 ENCOUNTER — Ambulatory Visit (INDEPENDENT_AMBULATORY_CARE_PROVIDER_SITE_OTHER): Payer: No Typology Code available for payment source | Admitting: Family Medicine

## 2012-12-03 ENCOUNTER — Encounter: Payer: Self-pay | Admitting: Family Medicine

## 2012-12-03 VITALS — BP 139/70 | HR 85 | Ht 61.75 in | Wt 191.0 lb

## 2012-12-03 DIAGNOSIS — E119 Type 2 diabetes mellitus without complications: Secondary | ICD-10-CM

## 2012-12-03 LAB — POCT GLYCOSYLATED HEMOGLOBIN (HGB A1C): Hemoglobin A1C: 7.1

## 2012-12-08 ENCOUNTER — Ambulatory Visit (INDEPENDENT_AMBULATORY_CARE_PROVIDER_SITE_OTHER): Payer: No Typology Code available for payment source | Admitting: Family Medicine

## 2012-12-08 ENCOUNTER — Encounter: Payer: Self-pay | Admitting: Family Medicine

## 2012-12-08 VITALS — BP 124/88 | HR 98 | Temp 98.0°F | Ht 61.0 in | Wt 191.0 lb

## 2012-12-08 DIAGNOSIS — E119 Type 2 diabetes mellitus without complications: Secondary | ICD-10-CM

## 2012-12-08 DIAGNOSIS — I10 Essential (primary) hypertension: Secondary | ICD-10-CM

## 2012-12-08 DIAGNOSIS — N649 Disorder of breast, unspecified: Secondary | ICD-10-CM

## 2012-12-08 DIAGNOSIS — F988 Other specified behavioral and emotional disorders with onset usually occurring in childhood and adolescence: Secondary | ICD-10-CM

## 2012-12-08 DIAGNOSIS — N6489 Other specified disorders of breast: Secondary | ICD-10-CM

## 2012-12-08 MED ORDER — METHYLPHENIDATE HCL 20 MG PO TABS: 20.0000 mg | ORAL_TABLET | Freq: Two times a day (BID) | ORAL | Status: DC

## 2012-12-08 NOTE — Progress Notes (Signed)
Family Medicine Office Visit Note   Subjective:   Patient ID: Tracy Weiss, female  DOB: [birth year], 47 y.o.. MRN: [id number]   Pt that comes today to refill her Ritalin. She reports that medication helps and denies side effects.   DM: she reports continue to be motivated to lose weight but after researching and going to pertinent seminar at [facility] she has decided that Bariatric Surgery is not a current option for her.  She continues with diet and exercise program. Pt states she is supposed to take Insulin BID but she only injects 50 units daily and sometimes 5 Novolog in Am if CBG's are above 140 before breakfast. She would like other options for DM control given the fact that insulin may contribute to her weight gain. Last A1C was 7.0  Review of Systems:  Pt denies SOB, chest pain, palpitations, headaches, dizziness, numbness or weakness. No changes on urinary or BM habits. No unintentional weigh loss/gain.  Objective:   Physical Exam: Gen:  NAD HEENT: Moist mucous membranes  CV: Regular rate and rhythm, no murmurs rubs or gallops PULM: Clear to auscultation bilaterally. No wheezes/rales/rhonchi ABD: Soft, non tender, non distended, normal bowel sounds EXT: No edema Neuro: Alert and oriented x3. No focalization  Assessment & Plan:

## 2012-12-08 NOTE — Patient Instructions (Addendum)
It has been a pleasure to see you today. Please take the medications as prescribed. Make your next appointment for pharmacy clinic and for laboratory.

## 2012-12-10 ENCOUNTER — Other Ambulatory Visit: Payer: No Typology Code available for payment source

## 2012-12-10 NOTE — Assessment & Plan Note (Signed)
Controlled. Continue current regimen. 

## 2012-12-10 NOTE — Assessment & Plan Note (Addendum)
Dermatology evaluate lesion and reports post inflammatory dermatitis. recommended triamcinolone cream BID.

## 2012-12-10 NOTE — Assessment & Plan Note (Addendum)
A1C 7.1, last 7.0. She is supposed to be on Lantus 80 BID, but only injecting 50u daily. Pt would like to explore other options for DM control. Besides improvement on A1C compared to prior results still compliance is a concern. Plan: Discussed with pt and she is agreeable to make appointment for Pharmacy clinic.  Microalbuminuria not obtained since pt is on ARB Recommended annual Opthalmology evaluation. Foot exam once a year.  Microalbuminuria not ordered, pt is on ARB fooron her  exam

## 2012-12-17 ENCOUNTER — Other Ambulatory Visit: Payer: Self-pay

## 2012-12-17 NOTE — Progress Notes (Signed)
Pt left without been seen

## 2012-12-22 ENCOUNTER — Encounter: Payer: Self-pay | Admitting: Pharmacist

## 2012-12-22 ENCOUNTER — Other Ambulatory Visit: Payer: No Typology Code available for payment source

## 2012-12-22 ENCOUNTER — Ambulatory Visit (INDEPENDENT_AMBULATORY_CARE_PROVIDER_SITE_OTHER): Payer: No Typology Code available for payment source | Admitting: Pharmacist

## 2012-12-22 VITALS — BP 128/85 | HR 71 | Ht 62.5 in | Wt 192.0 lb

## 2012-12-22 DIAGNOSIS — E119 Type 2 diabetes mellitus without complications: Secondary | ICD-10-CM

## 2012-12-22 MED ORDER — LIRAGLUTIDE 18 MG/3ML ~~LOC~~ SOLN: 1.8000 mg | Freq: Every day | SUBCUTANEOUS | Status: DC

## 2012-12-22 NOTE — Progress Notes (Signed)
  Subjective:    Patient ID: York Spaniel, female    DOB: Aug 25, 1966, 47 y.o.   MRN: [id number]  HPI Patient comes to clinic today in great spirits. Pt voiced concerns about weight gain over the last several years using insulin. Pt is currently ~190lbs and would like to be around ~150lbs. Pt states glucose is currently well controlled for the last several months. Interested in going to the gym soon. Voiced interest in alternative anti-diabetic agents to aid with wt loss and continue good glucose control.   Review of Systems     Objective:   Physical Exam        Assessment & Plan:   Diabetes of several yrs duration currently under good control of blood glucose based on   Lab Results  Component Value Date   HGBA1C 7.1 12/03/2012  home fasting CBG readings of low 100s and random CBG readings of 100s - 200s. Control is suboptimal due to likely elevated PPG readings, as patient is only taking Novolog approximately 1 time per day. Denies hypoglycemic events.  Able to verbalize appropriate hypoglycemia management plan. Decreased dose of basal insulin Lantus (insulin glargine) to 35 units daily. Increased dose of rapid insulin Novolog (insulin aspart) to 10 units three times daily with meals.  Written patient instructions provided.  Follow up in  Pharmacist Clinic Visit in approximately 1 month.   Total time in face to face counseling 35 minutes.  Patient seen with Abran Duke, PharmD Resident.   Transition to victoza was initiated with written script by Dr. Jennette Kettle provided to patient to start MAP process. Pt was counseled on the dosing of victoza, as well and the various adverse effects such as N/V/D and more serious issues such a pancreatitis and thyroid cancer. Pt was counseled on the transition from basal-bolus therapy to victoza. Pt was also counseled on administration technique. Pt was able to verbalize understanding of the education that was provided. F/u with pharmacy clinic in ~1 month for any  issues with victoza transition.  Marland Kitchen

## 2012-12-22 NOTE — Progress Notes (Signed)
PT CAME IN FOR LABS ONLY TODAY,PT HAD BLOOD DRAWN HOWEVER,NO ORDERS WERE IN EPIC.NO TEST WERE PERFORMED AT THIS TIME.DR. WAS OUT OF TOWN FOR THE WEEK AND WAS UNABLE TO BE REACHED Tracy Weiss

## 2012-12-22 NOTE — Patient Instructions (Addendum)
It was nice to see you today!  Please start taking Lantus 35 units daily.  Please start taking Novolog 10  Units three times daily with meals.   Continue to work on dietary changes and going to the gym.  Please follow-up with Dr. [name] in about 1 month.

## 2012-12-22 NOTE — Progress Notes (Signed)
Patient ID: Tracy Weiss, female   DOB: 1966-02-03, 47 y.o.   MRN: [id number] Reviewed: Agree with Dr. Macky Lower documentation and management

## 2012-12-22 NOTE — Assessment & Plan Note (Signed)
Diabetes of several yrs duration currently under good control of blood glucose based on   Lab Results  Component Value Date   HGBA1C 7.1 12/03/2012  home fasting CBG readings of low 100s and random CBG readings of 100s - 200s. Control is suboptimal due to likely elevated PPG readings, as patient is only taking Novolog approximately 1 time per day. Denies hypoglycemic events.  Able to verbalize appropriate hypoglycemia management plan. Decreased dose of basal insulin Lantus (insulin glargine) to 35 units daily. Increased dose of rapid insulin Novolog (insulin aspart) to 10 units three times daily with meals.  Written patient instructions provided.  Follow up in  Pharmacist Clinic Visit in approximately 1 month.   Total time in face to face counseling 35 minutes.  Patient seen with Abran Duke, PharmD Resident.   Transition to victoza was initiated with written script by Dr. Jennette Kettle provided to patient to start MAP process. Pt was counseled on the dosing of victoza, as well and the various adverse effects such as N/V/D and more serious issues such a pancreatitis and thyroid cancer. Pt was counseled on the transition from basal-bolus therapy to victoza. Pt was also counseled on administration technique. Pt was able to verbalize understanding of the education that was provided. F/u with pharmacy clinic in ~1 month for any issues with victoza transition.

## 2012-12-30 ENCOUNTER — Telehealth: Payer: Self-pay | Admitting: Family Medicine

## 2012-12-30 NOTE — Telephone Encounter (Signed)
LVM for patient to call back. Due to her having the orange card this request MUST go through [employer] in order for this appointment to be covered. This referral request will not be sent out until May 1st. It may be a little wait for this request we will put the doctor she requests on it. If patient is wanting to be seen on that date she will have to pay out of pocket due to the orange card always having to go through [employer] first.

## 2012-12-30 NOTE — Telephone Encounter (Signed)
Dr Acquanetta Sit # [phone]

## 2012-12-30 NOTE — Telephone Encounter (Signed)
Pt went to see Dr Burundi - DM Opthalm.  Needs to go back on May 19th and needs another referral to go back there for them to accept the orange card

## 2013-01-19 ENCOUNTER — Telehealth: Payer: Self-pay | Admitting: Family Medicine

## 2013-01-19 DIAGNOSIS — E119 Type 2 diabetes mellitus without complications: Secondary | ICD-10-CM

## 2013-01-19 NOTE — Telephone Encounter (Signed)
Spoke with [name].  She is very frustrated because she called on 12/30/2012 asking that a referral be sent to [employer] for Dr. Burundi because she was suppose to follow up on 01/19/2013.  I see the phone note where she called in.  Huntley Dec left her a voicemail stating that the referral would be faxed on 01/01/2013 which Tracy Weiss states she never received.  Does not look like request was routed to Dr. Aviva Signs so no referral was ever entered therefore no referral was sent to [facility].  Patient is very upset.  Doesn't understand why Dr. Aviva Signs wasn't given this message.  I called and spoke with Ewell Poe at [facility].  Explained that Dr. Burundi had already given Tracy Weiss an appointment slot for today but they needed a referral from [facility] in order for the visit to be covered under her orange card.  Explained that the patient had called in requesting this referral but it was never put in.  Asking if something can be worked out so Tracy Weiss can keep her appointment today at [facility].  Judeth Cornfield called Dr. Acquanetta Sit office and worked out the details with them.  She will have Tracy Weiss fax over the referral to Dr. Burundi.  Tracy Weiss informed to keep her appointment today.  She was very Adult nurse. I placed the referral in EPIC.    Tracy Weiss

## 2013-01-19 NOTE — Telephone Encounter (Signed)
Pt called. She wants to make sure her orange card has been authorized for use at Dr Lovie Macadamia. She has an appt with him today at 3. Please advise

## 2013-01-19 NOTE — Telephone Encounter (Signed)
Tried explained,she was frustrated so Lupita Leash continued to explain. Hollyn Stucky, [name]

## 2013-01-19 NOTE — Telephone Encounter (Signed)
Unable to reach patient,phone not receiving message. Tracy Weiss, [name]

## 2013-01-23 ENCOUNTER — Ambulatory Visit: Payer: No Typology Code available for payment source | Admitting: Pharmacist

## 2013-03-10 ENCOUNTER — Telehealth: Payer: Self-pay | Admitting: Family Medicine

## 2013-03-10 NOTE — Telephone Encounter (Signed)
Patient is coming in for a f/u appt on 7/16 at 8:45.  She thinks that she is supposed to have some labs drawn as well, but there are no orders in.  She is planning on coming in fasting just in case.

## 2013-03-11 ENCOUNTER — Other Ambulatory Visit: Payer: Self-pay | Admitting: Family Medicine

## 2013-03-11 DIAGNOSIS — E119 Type 2 diabetes mellitus without complications: Secondary | ICD-10-CM

## 2013-03-11 NOTE — Telephone Encounter (Signed)
Orders for labs entered in EMR. Pt can schedule and appointment for laboratory and 2-3 days later with me to discuss results during visit.

## 2013-03-18 ENCOUNTER — Ambulatory Visit (INDEPENDENT_AMBULATORY_CARE_PROVIDER_SITE_OTHER): Payer: No Typology Code available for payment source | Admitting: Family Medicine

## 2013-03-18 ENCOUNTER — Encounter: Payer: Self-pay | Admitting: Family Medicine

## 2013-03-18 VITALS — BP 122/75 | HR 88 | Temp 98.6°F | Ht 62.5 in | Wt 183.6 lb

## 2013-03-18 DIAGNOSIS — F988 Other specified behavioral and emotional disorders with onset usually occurring in childhood and adolescence: Secondary | ICD-10-CM

## 2013-03-18 DIAGNOSIS — E119 Type 2 diabetes mellitus without complications: Secondary | ICD-10-CM

## 2013-03-18 DIAGNOSIS — I10 Essential (primary) hypertension: Secondary | ICD-10-CM

## 2013-03-18 LAB — BASIC METABOLIC PANEL
BUN: 8 mg/dL (ref 6–23)
CO2: 27 mEq/L (ref 19–32)
Calcium: 9.4 mg/dL (ref 8.4–10.5)
Chloride: 104 mEq/L (ref 96–112)
Creat: 0.85 mg/dL (ref 0.50–1.10)
Glucose, Bld: 107 mg/dL — ABNORMAL HIGH (ref 70–99)
Potassium: 4.2 mEq/L (ref 3.5–5.3)
Sodium: 137 mEq/L (ref 135–145)

## 2013-03-18 LAB — CBC
HCT: 35.5 % — ABNORMAL LOW (ref 36.0–46.0)
Hemoglobin: 12 g/dL (ref 12.0–15.0)
MCH: 29.5 pg (ref 26.0–34.0)
MCHC: 33.8 g/dL (ref 30.0–36.0)
MCV: 87.2 fL (ref 78.0–100.0)
Platelets: 362 10*3/uL (ref 150–400)
RBC: 4.07 MIL/uL (ref 3.87–5.11)
RDW: 14.3 % (ref 11.5–15.5)
WBC: 5.2 10*3/uL (ref 4.0–10.5)

## 2013-03-18 LAB — LIPID PANEL
Cholesterol: 201 mg/dL — ABNORMAL HIGH (ref 0–200)
HDL: 45 mg/dL (ref >39)
LDL Cholesterol: 127 mg/dL — ABNORMAL HIGH (ref 0–99)
Total CHOL/HDL Ratio: 4.5 Ratio
Triglycerides: 146 mg/dL (ref <150)
VLDL: 29 mg/dL (ref 0–40)

## 2013-03-18 LAB — POCT GLYCOSYLATED HEMOGLOBIN (HGB A1C): Hemoglobin A1C: 7

## 2013-03-18 MED ORDER — METHYLPHENIDATE HCL ER (LA) 30 MG PO CP24: 30.0000 mg | ORAL_CAPSULE | ORAL | Status: DC

## 2013-03-18 NOTE — Assessment & Plan Note (Addendum)
Controlled with A1C at 7.0 today.  P/ Continue current care F/u with Dr. [name] as recommended. Lipid and Metabolic Profile drawn today.

## 2013-03-18 NOTE — Assessment & Plan Note (Addendum)
Pt taking Ritalin 30 mg a day in two separated doses (20 and 10) P/ Change to Ritalin Long Acting 30 mg daily, and evaluate response.

## 2013-03-18 NOTE — Progress Notes (Signed)
Family Medicine Office Visit Note   Subjective:   Patient ID: Tracy Weiss, female  DOB: 29-Jul-1966, 47 y.o.. MRN: [id number]   Pt that comes today for DM follow up and complaining of difficulty staying focused.   #1. DM f/u: Pt on new regimen for DM control. She is on Victosa and on Lantus 35 units. She is very happy with her weight loss of 12lb. She was supposed to f/u her new treatment changes on May/2014 with Dr. [name]. She reports continuing  to exercise and keeping her diabetic diet as strict as possible. She denies nausea,vomiting diarrhea or abdominal pain. Denies also hypoglycemic events.  #2 ADHD: She takes methylphenidate 20mg  plus another 1/2 a tab in 24 h to maintain focus. She uses it only when in classes (Monday through Friday). Denies side effects and reports no problems with sleeping, but she is concerned that this is not enough to keep her concentration and symptoms under control. She declines evaluation by Psychiatry specialists regarding this matter stating that she has never used them in the past and all her medication needs has been handled here in [facility].  Review of Systems:  Pt denies SOB, chest pain, palpitations, headaches, dizziness, numbness or weakness. No changes on urinary or BM habits.   Objective:   Physical Exam: Gen:  NAD HEENT: Moist mucous membranes  CV: Regular rate and rhythm, no murmurs rubs or gallops PULM: Clear to auscultation bilaterally. No wheezes/rales/rhonchi ABD: Obese, Soft, non tender, non distended, normal bowel sounds EXT: No edema Neuro: Alert and oriented x3. No focalization. Normal reflexes and 5/5 strength, intact sensation.  DM Foot exam done at this visit.   Assessment & Plan:

## 2013-03-18 NOTE — Patient Instructions (Addendum)
It has been a pleasure to see you today. Please take the medications as prescribed. I will call you with the labs results if they come back abnormal otherwise we will discuss them at your next appointment.

## 2013-03-18 NOTE — Assessment & Plan Note (Signed)
Controlled on HCTZ and ARB.

## 2013-03-31 ENCOUNTER — Encounter: Payer: Self-pay | Admitting: Family Medicine

## 2013-03-31 ENCOUNTER — Ambulatory Visit (INDEPENDENT_AMBULATORY_CARE_PROVIDER_SITE_OTHER): Payer: No Typology Code available for payment source | Admitting: Family Medicine

## 2013-03-31 VITALS — BP 129/76 | HR 87 | Temp 98.7°F | Ht 62.5 in | Wt 184.0 lb

## 2013-03-31 DIAGNOSIS — E119 Type 2 diabetes mellitus without complications: Secondary | ICD-10-CM

## 2013-03-31 DIAGNOSIS — R300 Dysuria: Secondary | ICD-10-CM

## 2013-03-31 DIAGNOSIS — E785 Hyperlipidemia, unspecified: Secondary | ICD-10-CM

## 2013-03-31 DIAGNOSIS — R109 Unspecified abdominal pain: Secondary | ICD-10-CM

## 2013-03-31 HISTORY — DX: Dysuria: R30.0

## 2013-03-31 LAB — POCT URINALYSIS DIPSTICK
Bilirubin, UA: NEGATIVE
Blood, UA: NEGATIVE
Glucose, UA: NEGATIVE
Ketones, UA: NEGATIVE
Leukocytes, UA: NEGATIVE
Nitrite, UA: NEGATIVE
Protein, UA: NEGATIVE
Spec Grav, UA: 1.02
Urobilinogen, UA: 0.2
pH, UA: 7

## 2013-03-31 MED ORDER — METRONIDAZOLE 0.75 % VA GEL: 1.0000 | Freq: Every day | VAGINAL | Status: AC

## 2013-03-31 MED ORDER — SIMVASTATIN 40 MG PO TABS: 40.0000 mg | ORAL_TABLET | Freq: Every day | ORAL | Status: DC

## 2013-03-31 NOTE — Patient Instructions (Signed)
All your recent labs are within normal limits except your LDL (cholesterol)  We have discussed options for treatment and have agreed to start you on Simvastatin 40 mg daily. Per your request we can recheck your direct LDL  in 6 months and evaluate response to medication. F/u with Dr. [name] for your DM management.

## 2013-03-31 NOTE — Assessment & Plan Note (Signed)
Dysuria and flank pain that have resolved. No fever or chills. No other symptoms. UA WNL. No further intervention.

## 2013-03-31 NOTE — Assessment & Plan Note (Signed)
Pt report never took Pravastatin as recommended in the past. Discussed start stating therapy with LDL goal for her below 70. P/ The ideal drug for her will be Crestor or Lipitor. Due to pt financial difficulties we have prescribed Simvastatin. (Pt discuss with our attending)

## 2013-03-31 NOTE — Progress Notes (Signed)
Family Medicine Office Visit Note   Subjective:   Patient ID: Tracy Weiss, female  DOB: 1966/01/19, 47 y.o.. MRN: [id number]   Pt that comes today to review labs. She reports feeling well but had mild dysuria and right flank pain 4 days ago that resolved completely. Denies fever, chills, nausea or vomiting. No other complaints.   Review of Systems:  Per HPI  Objective:   Physical Exam: Gen:  NAD HEENT: Moist mucous membranes  CV: Regular rate and rhythm, no murmurs rubs or gallops PULM: Clear to auscultation bilaterally. No wheezes/rales/rhonchi ABD: Soft, non tender, non distended, normal bowel sounds. No CVA tenderness. EXT: No edema Neuro: Alert and oriented x3. No focalization  Assessment & Plan:

## 2013-04-12 ENCOUNTER — Telehealth: Payer: Self-pay | Admitting: Family Medicine

## 2013-04-12 NOTE — Telephone Encounter (Signed)
[facility] Family Medicine Residency After Hours Line.   Started simvastatin a week ago. Diffuse muscle aches for a few days. Wants to come off medicine. Advised she is welcome to do that and see if it improves. To come in for appointment if no improvement off medicine. Can call in about a week to discuss future planning with PCP Dr. Abbott Pao. Marti Sleigh, MD, PGY3 04/12/2013 12:47 PM

## 2013-05-15 ENCOUNTER — Encounter: Payer: Self-pay | Admitting: Pharmacist

## 2013-05-15 ENCOUNTER — Ambulatory Visit (INDEPENDENT_AMBULATORY_CARE_PROVIDER_SITE_OTHER): Payer: No Typology Code available for payment source | Admitting: Pharmacist

## 2013-05-15 ENCOUNTER — Ambulatory Visit (INDEPENDENT_AMBULATORY_CARE_PROVIDER_SITE_OTHER): Payer: No Typology Code available for payment source | Admitting: Family Medicine

## 2013-05-15 VITALS — BP 126/89 | HR 84 | Ht 62.0 in | Wt 180.9 lb

## 2013-05-15 VITALS — BP 126/89 | HR 84

## 2013-05-15 DIAGNOSIS — Z800 Family history of malignant neoplasm of digestive organs: Secondary | ICD-10-CM

## 2013-05-15 DIAGNOSIS — E785 Hyperlipidemia, unspecified: Secondary | ICD-10-CM

## 2013-05-15 DIAGNOSIS — E119 Type 2 diabetes mellitus without complications: Secondary | ICD-10-CM

## 2013-05-15 DIAGNOSIS — F988 Other specified behavioral and emotional disorders with onset usually occurring in childhood and adolescence: Secondary | ICD-10-CM

## 2013-05-15 DIAGNOSIS — F909 Attention-deficit hyperactivity disorder, unspecified type: Secondary | ICD-10-CM

## 2013-05-15 MED ORDER — METHYLPHENIDATE HCL ER (LA) 30 MG PO CP24: 30.0000 mg | ORAL_CAPSULE | ORAL | Status: DC

## 2013-05-15 MED ORDER — ROSUVASTATIN CALCIUM 10 MG PO TABS: 10.0000 mg | ORAL_TABLET | Freq: Every day | ORAL | Status: DC

## 2013-05-15 NOTE — Assessment & Plan Note (Signed)
Diabetes diagnosed in 2008 currently on Lantus 35 units twice daily, Novolog 10 units TID with meals, Victoza 1.8 mg once daily Lab Results  Component Value Date   HGBA1C 7.0 03/18/2013   AND home fasting CBG readings of 100-140. Lowest reported is 87, highest 190 but very infrequent. She has made significant weight loss (15lbs) over the last several months and hopes to be around 150-170 lbs by December. Denies hypoglycemic events and is able to verbalize appropriate hypoglycemia management plan.  Reports adherence with medication and active lifestyle, walking 4x a week outside and up stairs. Patient is meeting goals for blood glucose control at this time but will likely need a reduction in insulin requirements due to increasing activity and weight loss. We will reduce her Lantus to 30 units twice daily, and continue Novolog 10 units TID with meals, and Victoza 1.8 mg daily. Patient was encouraged to drink more water and continue taking miralax for complaint of constipation. We will follow up in 2 weeks via telephone to adjust insulin and victoza if necessary. Written patient instructions provided.  Follow up in Pharmacist Clinic Visit in 4 weeks.   Total time in face to face counseling 40 minutes.  Patient seen with Anthony Sar, PharmD resident

## 2013-05-15 NOTE — Progress Notes (Signed)
S:    Patient arrives in good spirits today, happy about recent weight loss, 15 lbs in the last few months. She presents to the clinic for diabetes follow up, currently on Lantus 35 units twice daily, Novolog 10 units TID with meals, and Victoza 1.8 mg once daily. Patient reports having history of Diabetes since the year of 2008. At previous visit, pt reported CBGs in the 100s with high motivation to exercise and lose weight. Goal wt around 150 lbs.   Recently she has been walking 4x per week outside and up stairs. Today she is happy to have lost weight but concerned about recent constipation which may be correlated with Victoza use.    O:  Wt 180.9 lb BP 146/89   A/P: Diabetes diagnosed in 2008 currently on Lantus 35 units twice daily, Novolog 10 units TID with meals, Victoza 1.8 mg once daily Lab Results  Component Value Date   HGBA1C 7.0 03/18/2013   AND home fasting CBG readings of 100-140. Lowest reported is 87, highest 190 but very infrequent. She has made significant weight loss (15lbs) over the last several months and hopes to be around 150-170 lbs by December. Denies hypoglycemic events and is able to verbalize appropriate hypoglycemia management plan.  Reports adherence with medication and active lifestyle, walking 4x a week outside and up stairs. Patient is meeting goals for blood glucose control at this time but will likely need a reduction in insulin requirements due to increasing activity and weight loss. We will reduce her Lantus to 30 units twice daily, and continue Novolog 10 units TID with meals, and Victoza 1.8 mg daily. Patient was encouraged to drink more water and continue taking miralax for complaint of constipation. We will follow up in 2 weeks via telephone to adjust insulin and victoza if necessary. Written patient instructions provided.  Follow up in Pharmacist Clinic Visit in 4 weeks.   Total time in face to face counseling 40 minutes.  Patient seen with Anthony Sar, PharmD  resident

## 2013-05-15 NOTE — Assessment & Plan Note (Addendum)
Doing well on ritalin long acting formulation. No noticeable side effects.  Prescription given till Dec/2014.

## 2013-05-15 NOTE — Assessment & Plan Note (Signed)
Changed to Crestor 10mg  daily since pt is able to get medication with MAP program Direct LDL in December 2014 to follow up response and tailor tx.

## 2013-05-15 NOTE — Progress Notes (Signed)
Family Medicine Office Visit Note   Subjective:   Patient ID: Tracy Weiss, female  DOB: 09-06-1965, 47 y.o.. MRN: [id number]   Pt that comes today for her annual exam. She reports no complaints today. Before this visit she was seen for med review with our Pharmacist. Pt was very positive with her weight loss and her blood glucose results.  She also mentions doing well on Methylphenidate XR formulation changes on her prior visit. Denies noticeable side effects and reports her focus and memory has improved.   Pt reports Positive first line FHx of colon Cancer (mother who died at the age of 39) she also has another maternal uncle with this diagnosis. She denies change in coloration on her stools, but reports mild constipation after started on Victosa.>>> this was discussed with [facility] who recommend increase water intake and Miralax, possible decreasing Victosa if continues since this could be the cause but also we refer pt to get Screening Colonoscopy with her risk factors.  Pap smear: last in 2012, negative results since 2008>>> next pap smear in 2015. Mammography: negative done 10/2012>>> next mammography in 1 year.   Review of Systems:  Pt denies SOB, chest pain, palpitations, headaches, dizziness, numbness or weakness. No changes on urinary or BM habits.   Objective:   Physical Exam: Gen:  NAD HEENT: Moist mucous membranes  CV: Regular rate and rhythm, no murmurs rubs or gallops PULM: Clear to auscultation bilaterally. No wheezes/rales/rhonchi ABD: Soft, non tender, non distended, normal bowel sounds EXT: No edema Neuro: Alert and oriented x3. No focalization  Assessment & Plan:

## 2013-05-15 NOTE — Patient Instructions (Addendum)
Thanks for visiting [name].  You're making very good progress. Keep up the good work. We will make a couple changes to help adjust your insulin to your current lifestyle habits Please inject Lantus 30 units twice daily (morning and evening) Continue Novolog 10 units three times daily with meals, Victoza 1.8 mg once daily  Remember to stay hydrated and use your miralax if needed. We will call you in two weeks to follow up with your lantus and victoza dose.  Next visit in 4 weeks, you can schedule this at the front desk

## 2013-05-15 NOTE — Patient Instructions (Signed)
Congratulation with your new life style changes!!!! Please schedule a screening colonoscopy. I have provided you the information needed. Make a f/u appointment with Dr. [name] in 4 weeks. Your next pap smear will be in 2015. Mammography next year. F/u with me in 3-4 months or sooner if needed.

## 2013-05-15 NOTE — Assessment & Plan Note (Signed)
Reports mother died at age 45 with this diagnosis. Maternal uncle also with this dx per pt's report. P/ Screening colonoscopy recommended. Information given to pt.

## 2013-05-15 NOTE — Assessment & Plan Note (Addendum)
Well controlled on Victosa and Insulin. Mild constipation.  Insulin will be decreased today. Miralax and increase water intake for constipation.  F/u in 4 weeks with Pharmacy Clinic.

## 2013-05-18 NOTE — Progress Notes (Signed)
Patient ID: Tracy Weiss, female   DOB: [birth year], 47 y.o.   MRN: [id number] Reviewed: Agree with Dr. Macky Lower documentation and management.

## 2013-05-21 ENCOUNTER — Telehealth: Payer: Self-pay | Admitting: Family Medicine

## 2013-05-21 NOTE — Telephone Encounter (Signed)
Pt would like a refill request on Novolog. JW

## 2013-05-22 ENCOUNTER — Other Ambulatory Visit: Payer: Self-pay | Admitting: Family Medicine

## 2013-05-22 DIAGNOSIS — E1165 Type 2 diabetes mellitus with hyperglycemia: Secondary | ICD-10-CM

## 2013-05-22 DIAGNOSIS — IMO0002 Reserved for concepts with insufficient information to code with codable children: Secondary | ICD-10-CM

## 2013-05-22 MED ORDER — INSULIN ASPART 100 UNIT/ML ~~LOC~~ SOLN: 10.0000 [IU] | Freq: Three times a day (TID) | SUBCUTANEOUS | Status: DC

## 2013-05-22 NOTE — Telephone Encounter (Signed)
Informed patient of below.

## 2013-05-22 NOTE — Telephone Encounter (Signed)
Refill done place paper prescription to be faxed since pt gets it per [facility] program.

## 2013-06-27 ENCOUNTER — Telehealth: Payer: Self-pay | Admitting: Family Medicine

## 2013-06-27 NOTE — Telephone Encounter (Signed)
Patient called the after hours line at ~ 1100 PM.  She reports that earlier this morning and throughout the day she has not "felt like herself." She states that this started with left pinky numbness.  She has also had some lip numbness/tingling. Patient appears to be concerned that, given her risk factors, she may be having a stroke. I reassured her that her non-focal symptoms and lack of weakness, slurred speech or other deficits makes stroke highly unlikely. Patient reassured.  Advised Daily 81 mg Aspirin if she desired.  Encouraged follow up with PCP early next week.

## 2013-07-03 ENCOUNTER — Telehealth: Payer: Self-pay | Admitting: Family Medicine

## 2013-07-03 NOTE — Telephone Encounter (Signed)
[facility] Emergency Line  Patient calls complaining of 2 days of headache that is 6-7/10, throbbing, constant, and at her temples and near the bridge of her nose. She has also had runny nose and "a little cold" for the past 2-3 days. She does not report chest pain or shortness of breath and denies any other illness, sick contacts, dizziness, syncope, weakness, changes in speech or gait, or previous headache like this that she can remember. She thinks headache is due to her blood pressure being up, measured when having a headache at 140/90 when her baseline is usually 110-120/70s. She reports blurred vision for a few days that has resolved. She states her benicar was recently discontinued due to normal  BPs in clinic. She also wanted advice on lowering BP. She has not tried any medications. She also reports not having eaten much today.   A: Likely cold symptoms vs poor PO today vs BP mildly elevated from baseline. Unlikely to be hemorrhagic stroke at this level of BP elevation and with no neurologic changes other than blurred vision which could be related to migraine spectrum. P: - Encouraged better PO as this may make her feel better, especially staying hydrated. - Pt can try tylenol or ibuprofen for headache symptoms short-term. - Recommended recording 2-3 BPs when relaxed and bringing them into clinic for a visit with PCP next week to determine if pt needs to restart BP medication. BP possibly elevated due to pain. - Discussed return precautions including weakness, speech or gait changes, return of blurred vision, or dizziness/syncope, or other concerns.  Leona Singleton, MD  07/03/2013 7:26 PM

## 2013-07-20 ENCOUNTER — Ambulatory Visit: Payer: No Typology Code available for payment source

## 2013-08-06 ENCOUNTER — Ambulatory Visit: Payer: No Typology Code available for payment source

## 2013-09-15 ENCOUNTER — Other Ambulatory Visit: Payer: Self-pay | Admitting: Family Medicine

## 2013-09-15 ENCOUNTER — Telehealth: Payer: Self-pay | Admitting: Family Medicine

## 2013-09-15 DIAGNOSIS — F909 Attention-deficit hyperactivity disorder, unspecified type: Secondary | ICD-10-CM

## 2013-09-15 MED ORDER — METHYLPHENIDATE HCL ER (LA) 30 MG PO CP24
30.0000 mg | ORAL_CAPSULE | ORAL | Status: DC
Start: 2013-09-15 — End: 2013-12-22

## 2013-09-15 MED ORDER — METHYLPHENIDATE HCL ER (LA) 30 MG PO CP24: 30.0000 mg | ORAL_CAPSULE | ORAL | Status: DC

## 2013-09-15 NOTE — Telephone Encounter (Signed)
Prescription printed. Please inform pt, so she can pick them up and take it to pharmacy. Thank you.

## 2013-09-15 NOTE — Telephone Encounter (Signed)
Refill request for Ritalin. Per Pharmacy, please do not put a expiration date for RX. Patient sometimes has issues getting meds because to the "Valid from this date to that date".  Pharmacy would like to see just the month the RX is to be filled written on RX. Please call patient once RX are ready for pick up.

## 2013-09-21 ENCOUNTER — Telehealth: Payer: Self-pay | Admitting: Emergency Medicine

## 2013-09-21 NOTE — Telephone Encounter (Signed)
Emergency Line Call Patient calling for advice about an earache.  She reports waking up this morning and noticing a right earache.  It is associated with some right sided throat pain with swallowing.  No fevers, nausea, ear drainage.  She reports feeling a little off balance earlier, but this has resolved.  I recommended using tylenol and motrin for pain control tonight.  Okay to try an OTC earache drop as well.  I recommended that she call the office in the morning to schedule a SDA for further evaluation of the ear.  If the pain becomes more severe or she she starts having a lot of dizziness, she should go to the ER for evaluation. She expressed understanding and agreement with this plan.  Tracy Weiss 09/21/2013, 8:24 PM

## 2013-10-19 ENCOUNTER — Other Ambulatory Visit: Payer: Self-pay | Admitting: *Deleted

## 2013-10-19 DIAGNOSIS — I10 Essential (primary) hypertension: Secondary | ICD-10-CM

## 2013-10-20 MED ORDER — OLMESARTAN MEDOXOMIL-HCTZ 40-25 MG PO TABS: 1.0000 | ORAL_TABLET | Freq: Every day | ORAL | Status: DC

## 2013-11-16 ENCOUNTER — Other Ambulatory Visit: Payer: Self-pay | Admitting: *Deleted

## 2013-11-16 DIAGNOSIS — I10 Essential (primary) hypertension: Secondary | ICD-10-CM

## 2013-11-17 MED ORDER — OLMESARTAN MEDOXOMIL-HCTZ 40-25 MG PO TABS: 1.0000 | ORAL_TABLET | Freq: Every day | ORAL | Status: DC

## 2013-12-16 ENCOUNTER — Ambulatory Visit: Payer: No Typology Code available for payment source | Admitting: Family Medicine

## 2013-12-17 ENCOUNTER — Telehealth: Payer: Self-pay | Admitting: Family Medicine

## 2013-12-17 NOTE — Telephone Encounter (Signed)
Pt called and needs a refill on her Ritalin. jw

## 2013-12-17 NOTE — Telephone Encounter (Signed)
Will fwd to PCP for review. Pt has appt 12/25/13 with PCP. Thanks. Mauricia Area .

## 2013-12-21 NOTE — Telephone Encounter (Signed)
Left message on patient's voicemail.Lalani Winkles S Jailin [place]  

## 2013-12-21 NOTE — Telephone Encounter (Signed)
I will be in clinic Tuesday afternoon. This prescriptions needed to be printed and faxed to pharmacy. Thanks

## 2013-12-21 NOTE — Telephone Encounter (Signed)
Pt called and would like to know the status of her medication refill. She said that she is testing this week and needs her medications today. jw

## 2013-12-22 ENCOUNTER — Other Ambulatory Visit: Payer: Self-pay | Admitting: Family Medicine

## 2013-12-22 DIAGNOSIS — F909 Attention-deficit hyperactivity disorder, unspecified type: Secondary | ICD-10-CM

## 2013-12-22 MED ORDER — METHYLPHENIDATE HCL ER (LA) 30 MG PO CP24: 30.0000 mg | ORAL_CAPSULE | ORAL | Status: DC

## 2013-12-22 MED ORDER — METHYLPHENIDATE HCL ER (LA) 30 MG PO CP24
30.0000 mg | ORAL_CAPSULE | ORAL | Status: DC
Start: 2013-12-22 — End: 2014-01-28

## 2013-12-25 ENCOUNTER — Encounter: Payer: Self-pay | Admitting: Family Medicine

## 2013-12-25 ENCOUNTER — Ambulatory Visit (INDEPENDENT_AMBULATORY_CARE_PROVIDER_SITE_OTHER): Payer: No Typology Code available for payment source | Admitting: Family Medicine

## 2013-12-25 VITALS — BP 120/74 | HR 92 | Temp 98.2°F | Ht 61.0 in | Wt 183.8 lb

## 2013-12-25 DIAGNOSIS — E119 Type 2 diabetes mellitus without complications: Secondary | ICD-10-CM

## 2013-12-25 DIAGNOSIS — F988 Other specified behavioral and emotional disorders with onset usually occurring in childhood and adolescence: Secondary | ICD-10-CM

## 2013-12-25 DIAGNOSIS — I10 Essential (primary) hypertension: Secondary | ICD-10-CM

## 2013-12-25 LAB — POCT GLYCOSYLATED HEMOGLOBIN (HGB A1C): Hemoglobin A1C: 6.6

## 2013-12-25 MED ORDER — DEXMETHYLPHENIDATE HCL ER 10 MG PO CP24: 10.0000 mg | ORAL_CAPSULE | Freq: Every day | ORAL | Status: DC

## 2013-12-25 NOTE — Assessment & Plan Note (Signed)
On Ritalin 24h/ 30mg . Pt has financial difficulty to afford Ritalin and pharmacy can obtain Focalin free with for her. Prescription has been adjusted to reflect this change.  Will start at 10mg  (suggested 1/2 dose of methylphenidate) and adjust as needed. F/u in one month.

## 2013-12-25 NOTE — Assessment & Plan Note (Addendum)
On Benicar for HTN. Well controlled, at goal below 130/80. No changes on this regimen.

## 2013-12-25 NOTE — Patient Instructions (Signed)
We have switched your methylphenidate to focalin per request to more affordable medication. Dose is 10mg  daily. Please come back in one month to re-evaluate and monitor this change. Regarding your Diabetes, you can decrease Lantus by 2 units as long as your sugars at home are in the 100-140 range.

## 2013-12-25 NOTE — Progress Notes (Signed)
Family Medicine Office Visit Note   Subjective:   Patient ID: Tracy Weiss, female  DOB: 1965/10/06, 48 y.o.. MRN: [id number]   Pt that comes today for DM and ADHD f/u Please refer to specific problem list for more detail.  Review of Systems:  Pt denies SOB, chest pain, palpitations, headaches, dizziness, numbness or weakness. No changes on urinary or BM habits.   Objective:   Physical Exam: Gen:  NAD HEENT: Moist mucous membranes  CV: Regular rate and rhythm, no murmurs rubs or gallops PULM: Clear to auscultation bilaterally. No wheezes/rales/rhonchi ABD: Soft, non tender, non distended, normal bowel sounds EXT: No edema Neuro: Alert and oriented x3. No focalization  Assessment & Plan:

## 2013-12-25 NOTE — Assessment & Plan Note (Signed)
Continues with DM diet, exercise and motivated to lose weight. On Victoza 1.8, Lantus 30u, and Novolog on SSI 5-10 units BID. A1C 6.6 today. No hypoglycemic events or hyperglycemic symptoms. No noticeable side effects of this regimen. P/ Decrease Lantus by 2 units every 2 days to keep CBG's on 100-140 range.  F/u in 4 weeks or sooner if needed

## 2014-01-19 ENCOUNTER — Telehealth: Payer: Self-pay | Admitting: *Deleted

## 2014-01-19 NOTE — Telephone Encounter (Signed)
Received refill request for Victoza 18 mg/ 3ML from [facility].  Unable to reorder medication; stated medication is no longer available to order, need a new RX.  Derl Barrow, RN

## 2014-01-21 ENCOUNTER — Telehealth: Payer: Self-pay | Admitting: Family Medicine

## 2014-01-21 NOTE — Telephone Encounter (Signed)
Pt called and needs a hard copy of her prescription for Lantus and Novalog left up front for pickup. Please call when ready. jw

## 2014-01-22 ENCOUNTER — Telehealth: Payer: Self-pay | Admitting: *Deleted

## 2014-01-22 ENCOUNTER — Telehealth: Payer: Self-pay | Admitting: Family Medicine

## 2014-01-22 ENCOUNTER — Other Ambulatory Visit: Payer: Self-pay | Admitting: Family Medicine

## 2014-01-22 DIAGNOSIS — E1165 Type 2 diabetes mellitus with hyperglycemia: Secondary | ICD-10-CM

## 2014-01-22 DIAGNOSIS — IMO0002 Reserved for concepts with insufficient information to code with codable children: Secondary | ICD-10-CM

## 2014-01-22 MED ORDER — INSULIN ASPART 100 UNIT/ML ~~LOC~~ SOLN: 10.0000 [IU] | Freq: Three times a day (TID) | SUBCUTANEOUS | Status: DC

## 2014-01-22 MED ORDER — INSULIN GLARGINE 100 UNIT/ML ~~LOC~~ SOLN: 30.0000 [IU] | Freq: Two times a day (BID) | SUBCUTANEOUS | Status: DC

## 2014-01-22 NOTE — Telephone Encounter (Signed)
Prescription printed and at the front desk ready for pick up.

## 2014-01-22 NOTE — Telephone Encounter (Signed)
Pt called the after hours line thinking she has allergies.  Wanted to know what to get for this.  Recommended nasal saline, mucinex, nasacort if can afford, and OTC non sedating antihistamine.  No questions.  Tamela Oddi Awanda Mink, DO of [facility] 01/22/2014, 9:23 PM

## 2014-01-22 NOTE — Telephone Encounter (Signed)
Relayed message,patient voiced understanding.[place]

## 2014-01-22 NOTE — Telephone Encounter (Signed)
Patient came into office stating that she is needing refills on her ritalin and that they were not up front.

## 2014-01-27 ENCOUNTER — Other Ambulatory Visit: Payer: Self-pay | Admitting: *Deleted

## 2014-01-27 DIAGNOSIS — I10 Essential (primary) hypertension: Secondary | ICD-10-CM

## 2014-01-27 NOTE — Telephone Encounter (Signed)
Ritalin is not longer her medication. This was chaged to Focalin due to affordability issues. Last prescription was valid till 6/21. Not ready to be refilled again.

## 2014-01-28 ENCOUNTER — Other Ambulatory Visit: Payer: Self-pay | Admitting: Family Medicine

## 2014-01-28 ENCOUNTER — Encounter: Payer: Self-pay | Admitting: Family Medicine

## 2014-01-28 MED ORDER — DEXMETHYLPHENIDATE HCL ER 15 MG PO CP24: 15.0000 mg | ORAL_CAPSULE | Freq: Every day | ORAL | Status: DC

## 2014-01-28 MED ORDER — OLMESARTAN MEDOXOMIL-HCTZ 40-25 MG PO TABS: 1.0000 | ORAL_TABLET | Freq: Every day | ORAL | Status: DC

## 2014-01-28 NOTE — Progress Notes (Unsigned)
Patient needs another rx written for dexmethylphenidate for 15 mg.  She brought back other rx and I placed it in your box.  Also she needs a [facility]ill of Benicard [facility]  Please call her regarding these if you have questions.  [phone]

## 2014-02-02 ENCOUNTER — Other Ambulatory Visit: Payer: Self-pay | Admitting: *Deleted

## 2014-02-02 DIAGNOSIS — I10 Essential (primary) hypertension: Secondary | ICD-10-CM

## 2014-02-02 MED ORDER — OLMESARTAN MEDOXOMIL-HCTZ 40-25 MG PO TABS: 1.0000 | ORAL_TABLET | Freq: Every day | ORAL | Status: DC

## 2014-02-02 NOTE — Telephone Encounter (Signed)
Ok to refill the Victoza with current dosage; direction and refills?  Derl Barrow, RN

## 2014-02-02 NOTE — Telephone Encounter (Signed)
Received call from [place] for Refill request for [place].  Second request.  Derl Barrow, RN

## 2014-02-03 ENCOUNTER — Other Ambulatory Visit: Payer: Self-pay | Admitting: Family Medicine

## 2014-02-03 ENCOUNTER — Telehealth: Payer: Self-pay | Admitting: *Deleted

## 2014-02-03 MED ORDER — LIRAGLUTIDE 18 MG/3ML ~~LOC~~ SOPN: 1.8000 mg | PEN_INJECTOR | Freq: Every day | SUBCUTANEOUS | Status: DC

## 2014-02-03 NOTE — Telephone Encounter (Signed)
Prescription not longer available in solution. It has been changed to pen with no change in dose.

## 2014-02-03 NOTE — Telephone Encounter (Signed)
Dawn from [place] called requesting a refill on Victoza.  Unable to reorder on the medication list.  Please fax a new Rx to [facility] at [phone] or call [phone].  Derl Barrow, RN

## 2014-02-04 ENCOUNTER — Ambulatory Visit: Payer: No Typology Code available for payment source

## 2014-02-11 ENCOUNTER — Ambulatory Visit: Payer: No Typology Code available for payment source

## 2014-02-15 ENCOUNTER — Telehealth: Payer: Self-pay | Admitting: Family Medicine

## 2014-02-15 NOTE — Telephone Encounter (Signed)
Tracy Weiss is an [employer] recipient and is requesting a referral to a podiatrist for a callus on her toe that she want to have checked.  Please inform me appt has been scheduled

## 2014-02-25 ENCOUNTER — Telehealth: Payer: Self-pay | Admitting: Family Medicine

## 2014-02-25 NOTE — Telephone Encounter (Signed)
Needs increase dosage on focaline (sp?). The 15 mg did not work She only has 3 pills left

## 2014-03-02 ENCOUNTER — Encounter: Payer: Self-pay | Admitting: Family Medicine

## 2014-03-02 ENCOUNTER — Ambulatory Visit (INDEPENDENT_AMBULATORY_CARE_PROVIDER_SITE_OTHER): Payer: No Typology Code available for payment source | Admitting: Family Medicine

## 2014-03-02 VITALS — BP 115/76 | HR 82 | Temp 98.0°F | Resp 18 | Wt 179.0 lb

## 2014-03-02 DIAGNOSIS — J069 Acute upper respiratory infection, unspecified: Secondary | ICD-10-CM

## 2014-03-02 DIAGNOSIS — F988 Other specified behavioral and emotional disorders with onset usually occurring in childhood and adolescence: Secondary | ICD-10-CM

## 2014-03-02 MED ORDER — DEXMETHYLPHENIDATE HCL ER 30 MG PO CP24: 30.0000 mg | ORAL_CAPSULE | Freq: Every day | ORAL | Status: DC

## 2014-03-02 NOTE — Assessment & Plan Note (Signed)
Patient notes having better symptom control on focalin 30 mg daily. I provided refills of this for 3 months. She will need to follow-up with her new PCP in 2-3 months for further refills.

## 2014-03-02 NOTE — Patient Instructions (Signed)
Nice to meet you.  I have refilled your focalin today. If this dose does not work please let us know.  Follow-up with your PCP in 3 months.

## 2014-03-02 NOTE — Assessment & Plan Note (Deleted)
Patient with signs and symptoms of URI. Could also be a bronchitis, though there is no wheezing on exam. Discussed that this is viral in nature. Nothing on exam to indicate a PNA. Will treat cough with hydrocodone-chlorpheniramine. Mucinex and plenty of water to help with mucus. This will likely take several weeks for the cough to completely resolve. Given return precautions. F/u prn.

## 2014-03-02 NOTE — Progress Notes (Signed)
Patient ID: Tracy Weiss, female   DOB: 1965-09-07, 48 y.o.   MRN: [id number]  Tommi Rumps, MD Phone: [phone]  Tracy Weiss is a 48 y.o. female who presents today for same day appointment.  ADHD: patient presents for medication refills. She notes she was switched to focalin at last her last visit. She was on 15 mg, though felt this was not helping enough so she increased the dose on her own to 30 mg daily. She feels this is working well for her. She was having a lot of difficulty focusing, and this has decreased with the increase in dose. She denies palpitations and jitteriness. Appetite has been ok. She has been out of medication for 4 days.  Patient is a former smoker.   ROS: Per HPI   Physical Exam Filed Vitals:   03/02/14 1000  BP: 115/76  Pulse: 82  Temp: 98 F (36.7 C)  Resp: 18   Gen: Well NAD Lungs: CTABL Nl WOB, no wheezing noted Heart: RRR no MRG Exts: Non edematous BL  LE, warm and well perfused.    Assessment/Plan: Please see individual problem list.  # Healthcare maintenance: no addressed

## 2014-03-22 ENCOUNTER — Other Ambulatory Visit: Payer: Self-pay | Admitting: Family Medicine

## 2014-03-22 DIAGNOSIS — Z803 Family history of malignant neoplasm of breast: Secondary | ICD-10-CM

## 2014-03-25 ENCOUNTER — Ambulatory Visit (HOSPITAL_COMMUNITY)
Admission: RE | Admit: 2014-03-25 | Discharge: 2014-03-25 | Disposition: A | Discharge status: Home / Self Care | Payer: No Typology Code available for payment source | Source: Ambulatory Visit | Attending: Family Medicine | Admitting: Family Medicine

## 2014-03-25 DIAGNOSIS — Z803 Family history of malignant neoplasm of breast: Secondary | ICD-10-CM

## 2014-03-25 DIAGNOSIS — Z1231 Encounter for screening mammogram for malignant neoplasm of breast: Secondary | ICD-10-CM | POA: Insufficient documentation

## 2014-03-26 ENCOUNTER — Ambulatory Visit (HOSPITAL_COMMUNITY): Payer: No Typology Code available for payment source

## 2014-03-29 ENCOUNTER — Ambulatory Visit (INDEPENDENT_AMBULATORY_CARE_PROVIDER_SITE_OTHER): Payer: No Typology Code available for payment source | Admitting: Family Medicine

## 2014-03-29 ENCOUNTER — Encounter: Payer: Self-pay | Admitting: Family Medicine

## 2014-03-29 VITALS — BP 110/70 | HR 89 | Temp 98.3°F | Ht 61.0 in | Wt 177.0 lb

## 2014-03-29 DIAGNOSIS — F900 Attention-deficit hyperactivity disorder, predominantly inattentive type: Secondary | ICD-10-CM

## 2014-03-29 DIAGNOSIS — F909 Attention-deficit hyperactivity disorder, unspecified type: Secondary | ICD-10-CM

## 2014-03-29 DIAGNOSIS — F988 Other specified behavioral and emotional disorders with onset usually occurring in childhood and adolescence: Secondary | ICD-10-CM

## 2014-03-29 DIAGNOSIS — E119 Type 2 diabetes mellitus without complications: Secondary | ICD-10-CM

## 2014-03-29 LAB — POCT GLYCOSYLATED HEMOGLOBIN (HGB A1C): Hemoglobin A1C: 6.5

## 2014-03-29 MED ORDER — METHYLPHENIDATE HCL ER (LA) 30 MG PO CP24
30.0000 mg | ORAL_CAPSULE | ORAL | Status: DC
Start: 2014-03-29 — End: 2014-04-28

## 2014-03-29 NOTE — Assessment & Plan Note (Addendum)
A1C 6.5 today, improved from 6.7.  Has seen ophthalmologist within last year.  Normal foot exam today.  Would like referral to podiatry, however waiting list for patients with [place] is multiple months long.  Discuss podiatry referral at next visit.

## 2014-03-29 NOTE — Patient Instructions (Signed)
Thank you so much for coming to see me today!  I've given you a prescription for Methylphenidate (Ritalin) 30mg .  Please take one every morning.  In ADD, there really isn't one medication that works the best for any one person; instead, we have to find the medication that works best for each individual, since everyone is different!  Since Ritalin worked really well for you in the past and didn't have any side effects, it seems to be the best medication to try at this time!    Please follow-up with me in one month so we can evaluate how the Ritalin is working!  Thanks again! Dr. Gerlean Ren

## 2014-03-29 NOTE — Assessment & Plan Note (Signed)
Discontinued prescription of Foculin.  Patient returned unfilled prescriptions and I tore them into pieces and placed in shred box.   Restarted prescription for Methylphenidate 30mg  once a day and given 30day supply.  Instructed patient to followup in one month, but patient stated this may be difficult because she cannot afford office visits.  Patient instructed to call if she experiences any problems with medication.

## 2014-03-29 NOTE — Progress Notes (Signed)
Subjective:     Patient ID: Tracy Weiss, female   DOB: [birth year], 48 y.o.   MRN: [id number]  HPI Tracy Weiss is a 48yo African American female with a history of Attention Deficit Disorder without Hyperactivity, HTN, DM, and Hyperlipidemia presenting today to discuss adverse effects she's been having on her Foculin 30mg .  She has been noting blurred vision, slurred speech, and mild confusion ever since starting Foculin.  She noted these symptoms even at the lowest dose and they have gotten worse since increasing the dose.  At first she didn't think it was due to the [facility], but since stopping the medication last Tuesday (03/23/14), these symptoms have resolved.  She has had more trouble focusing since stopping the medication and would like to start back on the Ritalin even though she has to pay for it.  The Ritalin worked well for her and she did not have the adverse effects she has experienced with Foculin.   She often experiences trouble wrapping up final details of a project and getting things organized and often feels overly active.  She very often experiences problems remembering appointments and obligations, procrastination, and fidgeting.  She had symptoms of ADD as a child but she was never officially diagnosed due to lack of resources.  She was tested in college at [employer] [facility] and was diagnosed with ADD.  She first began taking medications in 2005 but then stopped, restarting medication in 2011.  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  She will begin working on her Masters in January 2016.    She has a history of well controlled diabetes.  She has seen an ophthalmologist within the past year.  She would like a referral to podiatry.  Review of Systems  Eyes: Positive for visual disturbance.  Neurological: Positive for speech difficulty.  Psychiatric/Behavioral: Positive for confusion.       Objective:    Physical Exam  Constitutional: She appears well-developed and well-nourished. No distress.  Cardiovascular: Normal rate, regular rhythm and normal heart sounds.  Exam reveals no gallop and no friction rub.   No murmur heard. Pulmonary/Chest: Effort normal and breath sounds normal. No respiratory distress. She has no wheezes. She has no rales.  Diabetic Foot Exam- No decrease sensation noted, No sores or ulcers noted.  ADHD Self-Report Scale supported diagnosis of ADD with 6 gray boxes selected in Part A.     Assessment:     Please see Problem List for Assessment.       Plan:     Please see Problem List for Plan.

## 2014-03-30 ENCOUNTER — Encounter: Payer: Self-pay | Admitting: Family Medicine

## 2014-03-30 ENCOUNTER — Other Ambulatory Visit: Payer: Self-pay | Admitting: *Deleted

## 2014-03-30 MED ORDER — LIRAGLUTIDE 18 MG/3ML ~~LOC~~ SOPN: 1.8000 mg | PEN_INJECTOR | Freq: Every day | SUBCUTANEOUS | Status: DC

## 2014-04-28 ENCOUNTER — Encounter: Payer: Self-pay | Admitting: Family Medicine

## 2014-04-28 ENCOUNTER — Telehealth: Payer: Self-pay | Admitting: Family Medicine

## 2014-04-28 DIAGNOSIS — F900 Attention-deficit hyperactivity disorder, predominantly inattentive type: Secondary | ICD-10-CM

## 2014-04-28 MED ORDER — METHYLPHENIDATE HCL ER (LA) 30 MG PO CP24: 30.0000 mg | ORAL_CAPSULE | ORAL | Status: DC

## 2014-04-28 NOTE — Telephone Encounter (Signed)
Relayed message,after a long conversation trying to convince patient to schedule an appointment.she agreed that she would schedule appointment  once she picks up RX.Tracy Weiss, Tracy Weiss

## 2014-04-28 NOTE — Progress Notes (Signed)
Patient would like for you to call her because she is upset at problem she had getting her rx written.  She said she was told that she had to have an appt with her dr.  Marcelline Weiss rx was written but when she came in to get it and make the appointment the dr doesn't even have any openings for the whole month of September and the October schedule is not out yet.  She does not like have a 1st year dr.  Her phone number is [phone].

## 2014-04-28 NOTE — Telephone Encounter (Signed)
Refill request for methylphenidate. Call pt for pick up.

## 2014-04-28 NOTE — Telephone Encounter (Signed)
Methylphenidate 30mg  refilled at this time for one month supply.  Please have Tracy Weiss schedule an appointment soon...we changed her medications at the last visit and she was instructed to try and follow up in a month if possible.

## 2014-04-29 ENCOUNTER — Other Ambulatory Visit: Payer: Self-pay | Admitting: Family Medicine

## 2014-04-29 DIAGNOSIS — F900 Attention-deficit hyperactivity disorder, predominantly inattentive type: Secondary | ICD-10-CM

## 2014-04-29 MED ORDER — METHYLPHENIDATE HCL ER (LA) 30 MG PO CP24: 30.0000 mg | ORAL_CAPSULE | ORAL | Status: DC

## 2014-05-11 NOTE — Progress Notes (Signed)
Spoke with patient and she is "Tracy Weiss."  Had picked up Rx for Ritalin on 04/29/14.  Has appt with Dr. Gerlean Ren on 06/04/14 and will keep me updated if she still wants to change her PCP.  Burna Forts, BSN, RN-BC

## 2014-05-25 ENCOUNTER — Other Ambulatory Visit: Payer: Self-pay | Admitting: *Deleted

## 2014-05-25 DIAGNOSIS — IMO0002 Reserved for concepts with insufficient information to code with codable children: Secondary | ICD-10-CM

## 2014-05-25 DIAGNOSIS — E1165 Type 2 diabetes mellitus with hyperglycemia: Secondary | ICD-10-CM

## 2014-05-25 MED ORDER — INSULIN ASPART 100 UNIT/ML ~~LOC~~ SOLN: 10.0000 [IU] | Freq: Three times a day (TID) | SUBCUTANEOUS | Status: DC

## 2014-05-31 ENCOUNTER — Other Ambulatory Visit: Payer: Self-pay | Admitting: *Deleted

## 2014-05-31 DIAGNOSIS — E785 Hyperlipidemia, unspecified: Secondary | ICD-10-CM

## 2014-05-31 MED ORDER — ROSUVASTATIN CALCIUM 10 MG PO TABS: 10.0000 mg | ORAL_TABLET | Freq: Every day | ORAL | Status: DC

## 2014-05-31 MED ORDER — LIRAGLUTIDE 18 MG/3ML ~~LOC~~ SOPN: 1.8000 mg | PEN_INJECTOR | Freq: Every day | SUBCUTANEOUS | Status: DC

## 2014-06-04 ENCOUNTER — Encounter: Payer: Self-pay | Admitting: Family Medicine

## 2014-06-04 ENCOUNTER — Ambulatory Visit (INDEPENDENT_AMBULATORY_CARE_PROVIDER_SITE_OTHER): Payer: No Typology Code available for payment source | Admitting: Family Medicine

## 2014-06-04 VITALS — BP 127/86 | HR 80 | Temp 98.8°F | Wt 184.0 lb

## 2014-06-04 DIAGNOSIS — E785 Hyperlipidemia, unspecified: Secondary | ICD-10-CM

## 2014-06-04 DIAGNOSIS — K589 Irritable bowel syndrome without diarrhea: Secondary | ICD-10-CM

## 2014-06-04 DIAGNOSIS — I10 Essential (primary) hypertension: Secondary | ICD-10-CM

## 2014-06-04 DIAGNOSIS — F909 Attention-deficit hyperactivity disorder, unspecified type: Secondary | ICD-10-CM

## 2014-06-04 DIAGNOSIS — F900 Attention-deficit hyperactivity disorder, predominantly inattentive type: Secondary | ICD-10-CM

## 2014-06-04 DIAGNOSIS — F988 Other specified behavioral and emotional disorders with onset usually occurring in childhood and adolescence: Secondary | ICD-10-CM

## 2014-06-04 DIAGNOSIS — R69 Illness, unspecified: Secondary | ICD-10-CM

## 2014-06-04 DIAGNOSIS — M19049 Primary osteoarthritis, unspecified hand: Secondary | ICD-10-CM | POA: Insufficient documentation

## 2014-06-04 LAB — LIPID PANEL
Cholesterol: 144 mg/dL (ref 0–200)
HDL: 50 mg/dL (ref >39)
LDL Cholesterol: 79 mg/dL (ref 0–99)
Total CHOL/HDL Ratio: 2.9 Ratio
Triglycerides: 74 mg/dL (ref <150)
VLDL: 15 mg/dL (ref 0–40)

## 2014-06-04 LAB — BASIC METABOLIC PANEL
BUN: 6 mg/dL (ref 6–23)
CO2: 24 mEq/L (ref 19–32)
Calcium: 8.8 mg/dL (ref 8.4–10.5)
Chloride: 106 mEq/L (ref 96–112)
Creat: 0.77 mg/dL (ref 0.50–1.10)
Glucose, Bld: 178 mg/dL — ABNORMAL HIGH (ref 70–99)
Potassium: 4.4 mEq/L (ref 3.5–5.3)
Sodium: 138 mEq/L (ref 135–145)

## 2014-06-04 MED ORDER — METHYLPHENIDATE HCL ER (LA) 30 MG PO CP24: 30.0000 mg | ORAL_CAPSULE | ORAL | Status: DC

## 2014-06-04 NOTE — Progress Notes (Signed)
Subjective:     Patient ID: Tracy Weiss, female   DOB: April 10, 1966, 48 y.o.   MRN: [id number]  HPI Tracy Weiss presents today for medication refill of Methylphenidate. # ADHD: - Follow up today following change of ADHD medication to Methylphenidate. - States she has not had any adverse effects of medication.  Denies loss of appetite, decreased weight, and heart palpitations. - Medication works well for her and helps her to focus better at school.  # Arthritis of Fingers: - Notes chronic pain in joints of fingers. - Worse with activity. - Has not tried any medications to relieve pain.  # Abdominal Bloating and Constipation: - Notes 15+ year history of abdominal bloating, worse in [place] - History of constipation and has tried multiple medications without relief.  Currently trying Super Cleanse. - Has tried cutting dairy and gluten products out of her diet without relief. - Mother died from Stomach Cancer - Would like referral to GI due to strong family history and long history of symptoms without response to medication management.  # Hyperlipidemia: - Currently on Rosuvastatin 10mg  - Would like lipid panel checked today  # HTN:b - Currently well controlled at 127/86 - Currently on Olmesartan-HCTZ  Review of Systems  Constitutional: Negative for appetite change and unexpected weight change.  Cardiovascular: Negative for palpitations.  Gastrointestinal: Positive for abdominal distention.       Objective:   Physical Exam  Constitutional: She appears well-developed and well-nourished. No distress.  HENT:  Head: Normocephalic and atraumatic.  Cardiovascular: Normal rate, regular rhythm, normal heart sounds and intact distal pulses.  Exam reveals no gallop and no friction rub.   No murmur heard. Pulmonary/Chest: Effort normal and breath sounds normal. No respiratory distress. She has no wheezes. She has no rales.  Abdominal: Soft. Bowel sounds are normal. She exhibits no distension and no  mass. There is no tenderness. There is no rebound and no guarding.  Musculoskeletal:  No edema noted in lower extremities bilaterally.  Skin: Skin is warm and dry. No rash noted. She is not diaphoretic.  Psychiatric: She has a normal mood and affect. Her behavior is normal.       Assessment:     Please see Problem List for Assessment.     Plan:     Please see Problem List for Plan.

## 2014-06-04 NOTE — Assessment & Plan Note (Signed)
-   Recommend Aspercreme for relief of pain

## 2014-06-04 NOTE — Assessment & Plan Note (Signed)
-   Referral to GI today due to 15year history without relief and family history of GI cancer.

## 2014-06-04 NOTE — Assessment & Plan Note (Signed)
-   Rechecking BMP today due to chronic use of Olmesartan-HCTZ - Well controlled

## 2014-06-04 NOTE — Assessment & Plan Note (Signed)
-   3 months of refills given for Methylphenidate - Agrees to return in 3 months for evaluation for effectiveness of medication, adverse effects, and further refills

## 2014-06-04 NOTE — Assessment & Plan Note (Signed)
Lipid panel today

## 2014-06-04 NOTE — Patient Instructions (Signed)
Thank you so much for coming to visit today! I've refilled your Methylphenidate for three months.  Please return in three months so we can see how you are doing on it! I've referred you to a GI doctor to follow up on your GI concerns. We will check your cholesterol and BMP today while you are here. You may try Aspercreme for your hands.  Thanks again! Dr. Gerlean Ren

## 2014-06-08 ENCOUNTER — Encounter: Payer: Self-pay | Admitting: Family Medicine

## 2014-06-11 ENCOUNTER — Telehealth: Payer: Self-pay | Admitting: Family Medicine

## 2014-06-11 DIAGNOSIS — IMO0002 Reserved for concepts with insufficient information to code with codable children: Secondary | ICD-10-CM

## 2014-06-11 DIAGNOSIS — E1165 Type 2 diabetes mellitus with hyperglycemia: Secondary | ICD-10-CM

## 2014-06-11 DIAGNOSIS — E785 Hyperlipidemia, unspecified: Secondary | ICD-10-CM

## 2014-06-11 MED ORDER — INSULIN ASPART 100 UNIT/ML ~~LOC~~ SOLN: 10.0000 [IU] | Freq: Three times a day (TID) | SUBCUTANEOUS | Status: DC

## 2014-06-11 MED ORDER — ROSUVASTATIN CALCIUM 10 MG PO TABS: 10.0000 mg | ORAL_TABLET | Freq: Every day | ORAL | Status: DC

## 2014-06-11 MED ORDER — LIRAGLUTIDE 18 MG/3ML ~~LOC~~ SOPN: 1.8000 mg | PEN_INJECTOR | Freq: Every day | SUBCUTANEOUS | Status: DC

## 2014-06-11 NOTE — Telephone Encounter (Signed)
Need printed rx to be faxed to [facility]

## 2014-06-11 NOTE — Telephone Encounter (Signed)
Pt calling re: med refills, is asking for [name] to call the health dept. Pt needs crestor, novolog and one other that I did not see on med list, please call pharmacy to verify.

## 2014-06-11 NOTE — Telephone Encounter (Signed)
Dawn is calling from [facility] re: this same issue, pt needs liraglutide, crestor, and novolog, according our records it was sent on 05/31/14 but they did not receive it.

## 2014-06-11 NOTE — Addendum Note (Signed)
Addended by: Lorna Few on: 06/11/2014 04:39 PM   Modules accepted: Orders

## 2014-07-21 ENCOUNTER — Telehealth: Payer: Self-pay | Admitting: Family Medicine

## 2014-07-21 NOTE — Telephone Encounter (Signed)
Pt called because she has some issues with her medication. She is taking Novolog after every meal so she is running out of this before she is due for a refill and her Lantus she doesn't use as much so she always has a lot of this left. SHe would like the doctor to call her and discuss the medication adjustment. Please call patient. jw

## 2014-07-22 ENCOUNTER — Other Ambulatory Visit: Payer: Self-pay | Admitting: *Deleted

## 2014-07-22 DIAGNOSIS — I10 Essential (primary) hypertension: Secondary | ICD-10-CM

## 2014-07-23 MED ORDER — OLMESARTAN MEDOXOMIL-HCTZ 40-25 MG PO TABS: 1.0000 | ORAL_TABLET | Freq: Every day | ORAL | Status: DC

## 2014-07-23 NOTE — Addendum Note (Signed)
Addended by: Lorna Few on: 07/23/2014 01:40 PM   Modules accepted: Orders

## 2014-08-10 ENCOUNTER — Telehealth: Payer: Self-pay | Admitting: *Deleted

## 2014-08-10 NOTE — Telephone Encounter (Signed)
Received a fax from [facility] needing clarification if pt's Lantus dosage has changed.  If dosage has changed please send new Rx. Fax placed in provider box for review.  Derl Barrow, RN

## 2014-08-11 ENCOUNTER — Other Ambulatory Visit: Payer: Self-pay | Admitting: Family Medicine

## 2014-08-11 DIAGNOSIS — E1165 Type 2 diabetes mellitus with hyperglycemia: Secondary | ICD-10-CM

## 2014-08-11 DIAGNOSIS — IMO0002 Reserved for concepts with insufficient information to code with codable children: Secondary | ICD-10-CM

## 2014-08-11 MED ORDER — INSULIN GLARGINE 100 UNIT/ML ~~LOC~~ SOLN: 30.0000 [IU] | Freq: Two times a day (BID) | SUBCUTANEOUS | Status: DC

## 2014-08-11 MED ORDER — INSULIN ASPART 100 UNIT/ML ~~LOC~~ SOLN: 10.0000 [IU] | Freq: Three times a day (TID) | SUBCUTANEOUS | Status: DC

## 2014-08-11 NOTE — Telephone Encounter (Signed)
Prescriptions for Novolog and Lantus printed and placed in "To Be Faxed" tray [facility]

## 2014-08-12 ENCOUNTER — Other Ambulatory Visit: Payer: Self-pay | Admitting: Family Medicine

## 2014-08-12 NOTE — Telephone Encounter (Signed)
Received message from [facility], [facility] needing clarification in Lantus dosage has decrease to 30 Units BID from 80 Units BID.  She also stated that the Novolog Rx needed to in quantity of 10 MLs; written for 27 MLs.  Please give her a call at [phone].  Derl Barrow, RN

## 2014-08-17 ENCOUNTER — Ambulatory Visit (INDEPENDENT_AMBULATORY_CARE_PROVIDER_SITE_OTHER): Payer: Self-pay | Admitting: *Deleted

## 2014-08-17 ENCOUNTER — Encounter: Payer: Self-pay | Admitting: Family Medicine

## 2014-08-17 ENCOUNTER — Ambulatory Visit (INDEPENDENT_AMBULATORY_CARE_PROVIDER_SITE_OTHER): Payer: Self-pay | Admitting: Family Medicine

## 2014-08-17 ENCOUNTER — Encounter: Payer: Self-pay | Admitting: Gastroenterology

## 2014-08-17 VITALS — BP 126/80 | HR 80 | Temp 98.1°F | Ht 61.0 in | Wt 184.7 lb

## 2014-08-17 DIAGNOSIS — Z124 Encounter for screening for malignant neoplasm of cervix: Secondary | ICD-10-CM

## 2014-08-17 DIAGNOSIS — Z23 Encounter for immunization: Secondary | ICD-10-CM

## 2014-08-17 DIAGNOSIS — F988 Other specified behavioral and emotional disorders with onset usually occurring in childhood and adolescence: Secondary | ICD-10-CM

## 2014-08-17 DIAGNOSIS — E119 Type 2 diabetes mellitus without complications: Secondary | ICD-10-CM

## 2014-08-17 DIAGNOSIS — F909 Attention-deficit hyperactivity disorder, unspecified type: Secondary | ICD-10-CM

## 2014-08-17 LAB — POCT GLYCOSYLATED HEMOGLOBIN (HGB A1C): Hemoglobin A1C: 6.4

## 2014-08-17 NOTE — Progress Notes (Signed)
Subjective:     Patient ID: Tracy Weiss, female   DOB: [birth year], 48 y.o.   MRN: [id number]  HPI Tracy Weiss is a 48yo female presenting today for annual exam.   #Attention Deficit Disorder: - Has history of ADHD. States this is well controlled on Methylphenidate. - Recently graduated from [employer]. Is going to start working on her Masters in [employer] in January. Plans to run for political office in next few years. - Memory appears to be getting worse. Concerned that medications may be causing this. States she has become more forgetful. This has been going on for years. Does not wish to discuss this in the office and would instead like to speak to psychology due to ADHD history.   # Diabetes: - States blood sugar is doing well. Has set personal A1C goal of 6. - A1C checked in 01/15 was 6.5 - Has ophthalmology appointment scheduled for today - Has noticed calluses on feet bilaterally. Has had these shaved off before by podiatrist. Does not want this done in office and would instead like referral to Podiatry. - Has seen Koval in past for diabetes medication management and would like to schedule another appointment to see him.  # Health Maintenance:  - BMP and Lipid Panel checked in 06/2014  - Mammogram 07/15 showed no evidence of malignancy. Repeat in one year.  - Pap Smear in 08/2011. Would like pap smear to be done today. - Would like colonoscopy due to family history of GI cancer and personal history of bloating and abdominal discomfort. Referral was placed at last visit.  Review of Systems  All other systems reviewed and are negative.  Social History reviewed.    Objective:   Physical Exam  Constitutional: She is oriented to person, place, and time. She appears well-developed and well-nourished. No distress.  HENT:  Head: Normocephalic and atraumatic.  Cardiovascular: Normal rate and regular rhythm.  Exam reveals no gallop and no friction rub.   No murmur  heard. Pulmonary/Chest: Effort normal and breath sounds normal. No respiratory distress. She has no wheezes. She has no rales.  Abdominal: Soft. Bowel sounds are normal. She exhibits no distension. There is no tenderness.  Genitourinary: There is no rash, tenderness or lesion on the right labia. There is no rash, tenderness or lesion on the left labia. Cervix exhibits no motion tenderness, no discharge and no friability. Right adnexum displays no mass and no tenderness. Left adnexum displays no mass and no tenderness. No vaginal discharge found.  Musculoskeletal: She exhibits no edema.  Neurological: She is alert and oriented to person, place, and time.  No decreased sensation in feet noted  Skin:  Diabetic foot exam showed some callus formation without ulceration      Assessment:     Please refer to Problem List for Assessment.     Plan:     Please refer to Problem List for Plan.  Health Maintenance:  - Up to date on labs and mammogram  - Pap Smear today.  - Approved for GI referral. GI office should be contacting her to set up appointment.

## 2014-08-17 NOTE — Assessment & Plan Note (Signed)
-   Referral to psychology to concerns with memory loss and ADHD medications - Recently completed [employer]. 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

## 2014-08-17 NOTE — Patient Instructions (Signed)
Thank you so much for coming to see me today!   We will check your A1C today. We will also do a pap smear today. We will contact you if the results are abnormal.  You will be contacted by the Gastroenterologists concerning an appointment time.  You will also be contacted by Podiatry for an appointment time.  Please schedule an appointment with Dr. Valentina Lucks and Dr. Gwenlyn Saran before you leave today.   Thanks again! Dr. Gerlean Ren

## 2014-08-17 NOTE — Assessment & Plan Note (Signed)
-   Well controlled - A1C of 6.4. Less than medical goal of 7. Mrs. Beman has set personal goal of <6. - Ophthalmology appointment today - Podiatry referral  - Continue current medication regimen - Follow up in 9-67months for recheck A1C

## 2014-08-18 ENCOUNTER — Other Ambulatory Visit: Payer: Self-pay | Admitting: *Deleted

## 2014-08-18 DIAGNOSIS — I10 Essential (primary) hypertension: Secondary | ICD-10-CM

## 2014-08-18 MED ORDER — OLMESARTAN MEDOXOMIL-HCTZ 40-25 MG PO TABS: 1.0000 | ORAL_TABLET | Freq: Every day | ORAL | Status: DC

## 2014-08-19 ENCOUNTER — Telehealth: Payer: Self-pay | Admitting: *Deleted

## 2014-08-19 NOTE — Telephone Encounter (Signed)
Called patient to explain to her that the pap collected on Tuesday was in a specimen bottle that had been pre labeled with another patient's information. Explained to her that due to safety precautions we could not run that sample and would need to recollect. She agreed to come in on Monday with Dr Gerlean Ren to recollect the pap. Will forward this message to Dr Gerlean Ren as an Juluis Rainier.Busick, Kevin Fenton

## 2014-08-20 ENCOUNTER — Encounter: Payer: Self-pay | Admitting: Pharmacist

## 2014-08-20 ENCOUNTER — Ambulatory Visit (INDEPENDENT_AMBULATORY_CARE_PROVIDER_SITE_OTHER): Payer: Self-pay | Admitting: Pharmacist

## 2014-08-20 VITALS — BP 117/77 | HR 87 | Ht 61.0 in | Wt 185.0 lb

## 2014-08-20 DIAGNOSIS — E785 Hyperlipidemia, unspecified: Secondary | ICD-10-CM

## 2014-08-20 DIAGNOSIS — E119 Type 2 diabetes mellitus without complications: Secondary | ICD-10-CM

## 2014-08-20 DIAGNOSIS — I10 Essential (primary) hypertension: Secondary | ICD-10-CM

## 2014-08-20 DIAGNOSIS — E1165 Type 2 diabetes mellitus with hyperglycemia: Secondary | ICD-10-CM

## 2014-08-20 DIAGNOSIS — IMO0002 Reserved for concepts with insufficient information to code with codable children: Secondary | ICD-10-CM

## 2014-08-20 MED ORDER — INSULIN ASPART 100 UNIT/ML ~~LOC~~ SOLN: 10.0000 [IU] | Freq: Three times a day (TID) | SUBCUTANEOUS | Status: DC

## 2014-08-20 MED ORDER — INSULIN GLARGINE 100 UNIT/ML ~~LOC~~ SOLN: 15.0000 [IU] | Freq: Every day | SUBCUTANEOUS | Status: DC

## 2014-08-20 NOTE — Assessment & Plan Note (Signed)
Diabetes currently controlled with A1c of 6.4% with patient goal of <6%. Patient is reports rare hypoglycemic events and is able to verbalize appropriate hypoglycemia management plan. Patient educated that hypoglycemia can be dangerous and it is not better than having hyperglycemia. Reports adherence with medication but self doses based on her blood glucose levels.  Patient in proactive and involved in managing her diabetes. Control is suboptimal to patient and she would like to come off of insulin.  Decreased dose of basal insulin Lantus (insulin glargine) to 15 units daily in the morning to prevent nocturnal hypoglycemia. Continued rapid insulin Novolog (insulin aspart) at 10 units daily but patient can titrate down to 5 units daily if she does not eat as much or her blood glucose is low. Patient provided with sample of Novolog. Continued Victoza (liraglutide) 1.8 mg daily.  Will consider addition of SGLT-2 inhibitor in 2016 based on blood glucose readings and insurance status.  Next A1C anticipated March 2016.  Written patient instructions provided.  Follow up in Pharmacist Clinic Visit in January 2016.   Total time in face to face counseling 45 minutes.  Patient seen with Nicoletta Ba, PharmD Resident.

## 2014-08-20 NOTE — Assessment & Plan Note (Signed)
History of elevated blood pressure.   Patient has self-discontinued olmesartan/HCTZ believing she does NOT need this medication since she has worked on her diet and exercise.  Currently at goal blood pressure.  Continue to reevaluate including urine for microalbuminuria (last performed in 2009) at an upcoming visit and consider reinitiating renin modulating therapy.

## 2014-08-20 NOTE — Progress Notes (Signed)
S:    Patient arrives to clinic in good spirits.  Presents for diabetes follow up.  Current diabetes medications: Lantus 30 units BID, Novolog 10 units with each meal, and Victoza 1.8 mg daily.   Patient reports taking Lantus 30 units daily, Novolog 10 units with each meal but may reduce to 5 units if her blood glucose is low or take Novolog 4 times a day if she eats 4 meals, and Victoza 1.8 mg daily. She reports that she has skipped both doses of Lantus in the past and didn't see much change in her blood glucose. She reports that she takes Lantus twice daily about twice a week. Typically though she has been taking Lantus 30 units at night.   Her personal A1c goal is < 5% and she would like to come off of all diabetic medications and be able to manage her diabetes with diet and exercise. She is recently joined a boot camp and works out 5 days per a week. She also has as Engineer, maintenance who helps her with her diet and who has told her that she is on too many medications. She feels that she is eating much better but still sometimes will eat candy bars or have a sweet drink once a week.   She would like to come off of Lantus as she feels like this is not helping her blood glucose and that it is just contributing to her weight. She is interested in using a SGLT2 inhibitor in order to get off of her insulin.   Patient reports that she may have a job in January which would provide her insurance. She is also enrolling in a [employer].   She reports that she does not have high blood pressure and that she does not take olmesartan-HCTZ. She also reports that she does not have cholesterol issues anymore and she does not take Crestor. She asks that these medications be removed from her list.    O:  . Lab Results  Component Value Date   HGBA1C 6.4 08/17/2014     home fasting CBG readings of 80s-100s 2 hour post-prandial/random CBG readings of 40s - 100s, rarely 200s.  A/P: Diabetes currently  controlled with A1c of 6.4% with patient goal of <6%. Patient is reports rare hypoglycemic events and is able to verbalize appropriate hypoglycemia management plan. Patient educated that hypoglycemia can be dangerous and it is not better than having hyperglycemia. Reports adherence with medication but self doses based on her blood glucose levels.  Patient in proactive and involved in managing her diabetes. Control is suboptimal to patient and she would like to come off of insulin.  Decreased dose of basal insulin Lantus (insulin glargine) to 15 units daily in the morning to prevent nocturnal hypoglycemia. Continued rapid insulin Novolog (insulin aspart) at 10 units daily but patient can titrate down to 5 units daily if she does not eat as much or her blood glucose is low. Patient provided with sample of Novolog. Continued Victoza (liraglutide) 1.8 mg daily.  Will consider addition of SGLT-2 inhibitor in 2016 based on blood glucose readings and insurance status.  Next A1C anticipated March 2016.  Written patient instructions provided.  Follow up in Pharmacist Clinic Visit in January 2016.   Total time in face to face counseling 45 minutes.  Patient seen with Nicoletta Ba, PharmD Resident.

## 2014-08-20 NOTE — Patient Instructions (Addendum)
It was good to see you today!  Decrease Lantus to 15 units in the morning. Adjust your Novolog as you need to.  Continue all of your other medications.  Come back and see [name] in January.

## 2014-08-20 NOTE — Addendum Note (Signed)
Addended by: Leavy Cella on: 08/20/2014 12:08 PM   Modules accepted: Orders

## 2014-08-20 NOTE — Assessment & Plan Note (Signed)
History of elevated LDL cholesterol > 100.   Most recent LDL was 79.   She is no longer taking rosuvastatin (Crestor) and does NOT want to take this medication.   Reevaluate this treatment plan at next PCP visit.

## 2014-08-23 ENCOUNTER — Ambulatory Visit (INDEPENDENT_AMBULATORY_CARE_PROVIDER_SITE_OTHER): Payer: Self-pay | Admitting: Podiatry

## 2014-08-23 ENCOUNTER — Encounter: Payer: Self-pay | Admitting: Podiatry

## 2014-08-23 ENCOUNTER — Encounter: Payer: Self-pay | Admitting: Family Medicine

## 2014-08-23 ENCOUNTER — Other Ambulatory Visit (HOSPITAL_COMMUNITY)
Admission: RE | Admit: 2014-08-23 | Discharge: 2014-08-23 | Disposition: A | Discharge status: Home / Self Care | Payer: Self-pay | Source: Ambulatory Visit | Attending: Family Medicine | Admitting: Family Medicine

## 2014-08-23 ENCOUNTER — Ambulatory Visit (INDEPENDENT_AMBULATORY_CARE_PROVIDER_SITE_OTHER): Payer: Self-pay | Admitting: Family Medicine

## 2014-08-23 ENCOUNTER — Ambulatory Visit (INDEPENDENT_AMBULATORY_CARE_PROVIDER_SITE_OTHER): Payer: Self-pay

## 2014-08-23 VITALS — BP 116/69 | HR 78 | Resp 16 | Ht 62.0 in | Wt 178.0 lb

## 2014-08-23 DIAGNOSIS — M2042 Other hammer toe(s) (acquired), left foot: Secondary | ICD-10-CM

## 2014-08-23 DIAGNOSIS — M2041 Other hammer toe(s) (acquired), right foot: Secondary | ICD-10-CM

## 2014-08-23 DIAGNOSIS — Z1151 Encounter for screening for human papillomavirus (HPV): Secondary | ICD-10-CM | POA: Insufficient documentation

## 2014-08-23 DIAGNOSIS — Z124 Encounter for screening for malignant neoplasm of cervix: Secondary | ICD-10-CM

## 2014-08-23 DIAGNOSIS — Z01419 Encounter for gynecological examination (general) (routine) without abnormal findings: Secondary | ICD-10-CM | POA: Insufficient documentation

## 2014-08-23 NOTE — Progress Notes (Signed)
Patient ID: Tracy Weiss, female   DOB: 02-07-1966, 48 y.o.   MRN: [id number] Discussed with Dr. Gerlean Weiss.  Agree with her management and documentation.

## 2014-08-23 NOTE — Progress Notes (Signed)
   Subjective:    Patient ID: Tracy Weiss, female    DOB: 16-Jun-1966, 48 y.o.   MRN: [id number]  HPI Comments: With both feet im having trouble with the bones on my little toes and my big toes. This has been going on for a while. They are getting worse. All shoes will bother me. The shoes rub. i have a callus on each foot. Its on the big toes and little toes. i have had pedicures, soaks in sea salt, and that's it.  Foot Pain Associated symptoms include numbness.   Patient describes pain localized to the medial interphalangeal joint hallux when walking wearing shoes. She describes pedicures for local treatment. The primary pain sources localized to the fifth toes which occurs in all shoes when walking. She describes changing her shoes, however, the pain localized the fifth toes make shoe shoe wearing an walking uncomfortable. She limits her walking because of her pain in the fifth toes. She describes visiting a podiatrist many years ago who described a surgical treatment of the fifth toes.  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 Patient is a diabetic with A1c readings in the 6 range. She is a nonsmoker  Review of Systems  Constitutional: Positive for unexpected weight change.  HENT:       Ringing in ears   Eyes: Positive for pain, redness and itching.  Gastrointestinal: Positive for constipation.       Bloating   Neurological: Positive for numbness.  All other systems reviewed and are negative.      Objective:   Physical Exam  Orientated 3  Vascular: DP pulses 2/4 bilaterally PT pulses 2/4 bilaterally Capillary reflex immediate bilaterally  Neurological: Sensation to 10 g monofilament wire intact 5/5 bilaterally Ankle reflex equal and reactive bilaterally Vibratory sensation intact bilaterally  Dermatological: Minimal keratoses medial interphalangeal joint of hallux bilaterally Punctate keratoses lateral PIPJ fifth left toe Fibrous reactive  tissue localized over the lateral PIPJ fifth toes bilaterally  Musculoskeletal: Pes planus bilaterally Adductovarus rotation fourth and fifth toes bilaterally Hallux interphalangeus bilaterally  X-ray examination right foot  Intact bony structure without fracture and/or dislocation Adductovarus rotation fourth and fifth toes  Radiographic impression: No acute bony abnormality noted in right foot Adductovarus rotation fourth and fifth toes were, right foot  X-ray examination left foot  Intact bony structure without fracture and/or dislocation Adductovarus rotation fourth and fifth toes  Radiographic impression: No acute bony abnormality noted in left foot Adductovarus rotation fourth and fifth toes, left foot    Assessment & Plan:   Assessment: Satisfactory neurovascular status Adequate control of diabetes Pinch calluses hallux bilaterally (possibly associated with hallux interphalangeus bilaterally) Adductovarus fifth toes bilaterally associated with lateral keratoses  Plan: Advised patient that diabetic foot screen today was within normal limits Described treatment options for hallux primarily to local conservative care.  Described treatment options for fifth toes including local conservative care, shoe modification and possible surgical treatment  Patient states that her primary pain sources are the fifth toes which limit her activity. We discussed in detail the possibility of hammertoe repairs the fifth toes as an option. I  emphasized the importance of limiting standing walking after possible elective foot surgery,  for 6-8 weeks. Patient has interest in considering surgical treatment of the fifth toes bilaterally.  Reschedule patient for consult with Dr. Mayo Ao to discuss treatment options in more detail including possible surgical treatment

## 2014-08-23 NOTE — Progress Notes (Signed)
Subjective:     Patient ID: Tracy Weiss, female   DOB: 1966/01/12, 48 y.o.   MRN: [id number]  HPI Tracy Weiss is a 48yo female presenting today for repeat Pap Smear. Pap smear was attempted on 08/17/14 at last office visit. There were some concerns about a possible mix-up in containers. Pap smear will be repeated today.  Review of Systems  Gastrointestinal: Positive for constipation.  All other systems reviewed and are negative.      Objective:   Physical Exam  Constitutional: She appears well-developed and well-nourished.  Genitourinary: There is no rash, tenderness or lesion on the right labia. There is no rash, tenderness or lesion on the left labia. Cervix exhibits no motion tenderness and no friability. No signs of injury around the vagina.       Assessment:     Pap Smear today.    Plan:     Pap Smear today. Will contact if abnormal.

## 2014-08-23 NOTE — Patient Instructions (Signed)
Diabetes and Foot Care Diabetes may cause you to have problems because of poor blood supply (circulation) to your feet and legs. This may cause the skin on your feet to become thinner, break easier, and heal more slowly. Your skin may become dry, and the skin may peel and crack. You may also have nerve damage in your legs and feet causing decreased feeling in them. You may not notice minor injuries to your feet that could lead to infections or more serious problems. Taking care of your feet is one of the most important things you can do for yourself.  HOME CARE INSTRUCTIONS  Wear shoes at all times, even in the house. Do not go barefoot. Bare feet are easily injured.  Check your feet daily for blisters, cuts, and redness. If you cannot see the bottom of your feet, use a mirror or ask someone for help.  Wash your feet with warm water (do not use hot water) and mild soap. Then pat your feet and the areas between your toes until they are completely dry. Do not soak your feet as this can dry your skin.  Apply a moisturizing lotion or petroleum jelly (that does not contain alcohol and is unscented) to the skin on your feet and to dry, brittle toenails. Do not apply lotion between your toes.  Trim your toenails straight across. Do not dig under them or around the cuticle. File the edges of your nails with an emery board or nail file.  Do not cut corns or calluses or try to remove them with medicine.  Wear clean socks or stockings every day. Make sure they are not too tight. Do not wear knee-high stockings since they may decrease blood flow to your legs.  Wear shoes that fit properly and have enough cushioning. To break in new shoes, wear them for just a few hours a day. This prevents you from injuring your feet. Always look in your shoes before you put them on to be sure there are no objects inside.  Do not cross your legs. This may decrease the blood flow to your feet.  If you find a minor  scrape, cut, or break in the skin on your feet, keep it and the skin around it clean and dry. These areas may be cleansed with mild soap and water. Do not cleanse the area with peroxide, alcohol, or iodine.  When you remove an adhesive bandage, be sure not to damage the skin around it.  If you have a wound, look at it several times a day to make sure it is healing.  Do not use heating pads or hot water bottles. They may burn your skin. If you have lost feeling in your feet or legs, you may not know it is happening until it is too late.  Make sure your health care provider performs a complete foot exam at least annually or more often if you have foot problems. Report any cuts, sores, or bruises to your health care provider immediately. SEEK MEDICAL CARE IF:   You have an injury that is not healing.  You have cuts or breaks in the skin.  You have an ingrown nail.  You notice redness on your legs or feet.  You feel burning or tingling in your legs or feet.  You have pain or cramps in your legs and feet.  Your legs or feet are numb.  Your feet always feel cold. SEEK IMMEDIATE MEDICAL CARE IF:   There is  increasing redness, swelling, or pain in or around a wound.  There is a red line that goes up your leg.  Pus is coming from a wound.  You develop a fever or as directed by your health care provider.  You notice a bad smell coming from an ulcer or wound. Document Released: 08/17/2000 Document Revised: 04/22/2013 Document Reviewed: 01/27/2013 [facility] Patient Information 2015 [place], [place]. This information is not intended to replace advice given to you by your health care provider. Make sure you discuss any questions you have with your health care provider.     Hammer Toes Hammer toes is a condition in which one or more of your toes is permanently flexed. CAUSES  This happens when a muscle imbalance or abnormal bone length makes your small toes buckle. This causes the toe  joint to contract and the strong cord-like bands that attach muscles to the bones (tendons) in your toes to shorten.  SIGNS AND SYMPTOMS  Common symptoms of flexible hammer toes include:   A buildup of skin cells (corns). Corns occur where boney bumps come in frequent contact with hard surfaces. For example, where your shoes press and rub.  Irritation.  Inflammation.  Pain.  Limited motion in your toes. DIAGNOSIS  Hammer toes are diagnosed through a physical exam of your toes. During the exam, your health care provider may try to reproduce your symptoms by manipulating your foot. Often, X-ray exams are done to determine the degree of deformity and to make sure that the cause is not a fracture.  TREATMENT  Hammer toes can be treated with corrective surgery. There are several types of surgical procedures that can treat hammer toes. The most common procedures include:  Arthroplasty--A portion of the joint is surgically removed and your toe is straightened. The gap fills in with fibrous tissue. This procedure helps treat pain and deformity and helps restore function.  Fusion--Cartilage between the two bones of the affected joint is taken out and the bones fuse together into one longer bone. This helps keep your toe stable and reduces pain but leaves your toe stiff, yet straight.  Implantation--A portion of your bone is removed and replaced with an implant to restore motion.  Flexor tendon transfers--This procedure repositions the tendons that curl the toes down (flexor tendons). This may be done to release the deforming force that causes your toe to buckle. Several of these procedures require fixing your toe with a pin that is visible at the tip of your toe. The pin keeps the toe straight during healing. Your health care provider will remove the pin usually within 4-8 weeks after the procedure.  Document Released: 08/17/2000 Document Revised: 08/25/2013 Document Reviewed: 04/27/2013 [facility]  Patient Information 2015 [place], [place]. This information is not intended to replace advice given to you by your health care provider. Make sure you discuss any questions you have with your health care provider.

## 2014-08-23 NOTE — Progress Notes (Signed)
Patient ID: Tracy Weiss, female   DOB: [birth year], 48 y.o.   MRN: [id number] Reviewed: Agree with Dr. Graylin Shiver documentation and management.

## 2014-08-24 ENCOUNTER — Encounter: Payer: Self-pay | Admitting: Podiatry

## 2014-08-24 LAB — CYTOLOGY - PAP

## 2014-09-01 ENCOUNTER — Ambulatory Visit (INDEPENDENT_AMBULATORY_CARE_PROVIDER_SITE_OTHER): Payer: Self-pay | Admitting: Psychology

## 2014-09-01 DIAGNOSIS — F988 Other specified behavioral and emotional disorders with onset usually occurring in childhood and adolescence: Secondary | ICD-10-CM

## 2014-09-01 DIAGNOSIS — F909 Attention-deficit hyperactivity disorder, unspecified type: Secondary | ICD-10-CM

## 2014-09-01 DIAGNOSIS — R413 Other amnesia: Secondary | ICD-10-CM

## 2014-09-01 NOTE — Progress Notes (Signed)
Tracy Weiss presents for an initial beh med appointment.  She was referred for "memory problems" and concerns that her ADHD medication isn't working as well as it once did.  I am not an expert in either of these areas but because mood and anxiety could be contributing to her concerns, I recommended an initial meeting so that we could determine what might be the next best step.  Presenting Issue: [name] reports both concerns about memory and focus.  She doesn't "store things" the way she thinks she should be able to and has difficulty "calling things up."  She reports she was diagnosed with ADHD (she thinks predominately inattentive) around 2010.  She thinks it might have been through [facility].  She says the issues with focus and memory have gotten significantly worse in 2015.  She has been told by her doctor that this is "normal" but she reports she is very introspective and in-tuned to herself and believes that something is different.  Report of symptoms: As above.  In addition, near the end of our meeting, she reported what she says are significant issues with anxiety.  She gets overwhelmed when "put on the spot" especially in business or school settings.  She does not socialize much - preferring to spend time alone or with her family so anxiety does not appear to affect her socially.    Age of onset of first mood disturbance:  In 2010 / 2011 she notes depression (her first reported one) secondary to the loss of both of her parents and a personal business dissolving.  She states that she was "low" but functional.  Impact on function:  She thinks her memory / focus issues have affected her confidence to the point where she is not saying "yes" to some opportunities that she might otherwise.  Denies effect on relationships.  Successfully graduated from [employer] with a BA degree and this past July from the [employer]."  Will start her Masters through [employer] in a few weeks.  Psychiatric  History - Diagnoses:  Earlier EMR (Provider Kasandra Knudsen) shows a prior diagnosis of Anxiety and Depression.  I don't know the details of this.  Wellbutrin was on her historical med list but Tracy thinks it might have been used for weight loss or ADHD. - Hospitalizations:  Denied. - Pharmacotherapy:  Denied other than Wellbutrin noted above. - Outpatient therapy:  Denied.  Family history of psychiatric issues:  Denied mental health issues in any family members and states that her parents and brothers function well.  Notes family relationships are good.  Parents were married.  Both are deceased (85 & 46 years old).  Two full brothers.  Xia was married one time when significantly younger.  Has twin daughters (25) and three grandchildren.  Lives with one daughter and her two boys.  Notes that this is a health living environment.    Current and history of substance use:  Reports about 4 ounces of alcohol twice a month.  Denies other use of substances.  Caffeine use sounds non-contributory.    Medical conditions that might explain or contribute to symptoms:  Reviewed med list and problem list.  Thyroid was done 06/2012 and was wnl.   PHQ-9: 7 with somewhat difficulty right now (notes she is afraid it will become difficult for at some point.  Only statement with a "2" was Trouble concentrating on things.  Denies suicidal ideation.    GAD7:  Between 6 - 10 (she marked two numbers for  a few of the statements).  Noted that her symptoms make it "somewhat difficult" to meet her functional needs.

## 2014-09-01 NOTE — Assessment & Plan Note (Signed)
This is not an area of expertise for me.  She states she was formally evaluated in 2010.  Methylphenidate 30 mg is current treatment.  Was tried on Focalin but it bothered her eyesight.  She wonders whether her ADHD medication is still effective.    I will consult with Dr. Valentina Lucks who recently saw her for her DM.  If neuropsychology can't see her, might consider referral to [facility] for an evaluation.  Not sure if this would be cost-prohibitive.  She as the orange card.

## 2014-09-01 NOTE — Assessment & Plan Note (Addendum)
Tracy Weiss is neatly groomed and appropriately dressed.  She maintains good eye contact and is cooperative and attentive.  Speech is normal in tone, rate and rhythm.  Mood is euthymic with a consistent affect.  Thought process is logical and goal directed.  No evidence of suicidal or homicidal ideation.  Does not appear to be responding to any internal stimuli.  Able to maintain train of thought and concentrate on the questions.  Judgment and insight are average to above average.    No gross memory deficits noted in one hour clinical interview.  Did not do any formal memory testing.  No word finding difficulty.  Attention / focus appeared within normal limits.  It is possible that inattention related to her ADHD or anxiety could be playing a role in memory.  Alternatively, she may be anxious about memory difficulties that are within normal limits.  Questioned whether multi-tasking (especially with electronics) had changed at all and might be playing a role.  She does not think so.  She is concerned that there is a neurological issue.    She reports some symptoms that might be consistent with Bipolar Disorder including decreased need for sleep for a few days at a time, racing thoughts, and speaking so fast at times that she becomes unclear.  She denies that any of these symptoms have interfered with her function and based on what she reports accomplishing recently, that assessment seems accurate.  Mood most days is reported as "content" and "laid back."  Does note she feels drained of energy much of the time.    Will discuss with Dr. Orvilla Fus, neuropsychologist in the [facility], to see if he (or one of his assistants) would be willing to do some testing of her memory.  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    See ADHD diagnosis.

## 2014-09-06 ENCOUNTER — Telehealth: Payer: Self-pay | Admitting: Psychology

## 2014-09-06 NOTE — Telephone Encounter (Signed)
Spoke with Antionette Poles who is a neuropscyhologist in the [place].  He had a couple of recommendations based on our discussion:  1.  Check a B12 2.  Is she perimenopausal or menopausal?  Women often report memory difficulties around this time. 3.  Sleep changes?  I asked this and it did not seem like there had been a significant change in this regard.    He said he would be willing to evaluate her and he can take the orange card.  He is booked out until April.  He will need a referral from the PCP - placed through EPIC.  He said it often does not get routed correctly so he will be on the lookout for her name but the PCP might want to follow with the patient to ensure it does go through.    I will let Dr. Gerlean Ren know.  I called the patient and let her know.  She will follow up with Dr. Gerlean Ren.

## 2014-09-08 ENCOUNTER — Ambulatory Visit: Payer: Self-pay | Admitting: Podiatry

## 2014-09-08 ENCOUNTER — Telehealth: Payer: Self-pay | Admitting: *Deleted

## 2014-09-08 NOTE — Telephone Encounter (Signed)
Received a refill request for Victoza from [facility].  Unable to reorder medication in EPIC; states have to place a new order.  Request form placed in provider for review.  Derl Barrow, RN

## 2014-09-09 MED ORDER — LIRAGLUTIDE 18 MG/3ML ~~LOC~~ SOPN: 1.8000 mg | PEN_INJECTOR | Freq: Every day | SUBCUTANEOUS | Status: DC

## 2014-09-09 NOTE — Telephone Encounter (Signed)
Prescription refilled and [facility]ed to [facility].

## 2014-09-21 ENCOUNTER — Telehealth: Payer: Self-pay | Admitting: Family Medicine

## 2014-09-21 NOTE — Telephone Encounter (Signed)
[facility] no [facility] provide Lantus.  Need new rx sent for Levemir instead.

## 2014-09-22 ENCOUNTER — Ambulatory Visit: Payer: Self-pay | Admitting: Nurse Practitioner

## 2014-09-23 ENCOUNTER — Ambulatory Visit: Payer: Self-pay | Admitting: Pharmacist

## 2014-09-23 MED ORDER — INSULIN DETEMIR 100 UNIT/ML ~~LOC~~ SOLN: 15.0000 [IU] | Freq: Every day | SUBCUTANEOUS | Status: DC

## 2014-09-23 NOTE — Telephone Encounter (Signed)
Lantus discontinued. Prescription for Levemir sent to [facility].

## 2014-10-05 ENCOUNTER — Encounter: Payer: Self-pay | Admitting: Family Medicine

## 2014-10-05 NOTE — Progress Notes (Unsigned)
Patient left form to be filled out for her employer.  She would like to be called when it is ready and was hoping to get it back this week.

## 2014-10-06 NOTE — Progress Notes (Unsigned)
Placed in PCP box, patient called asking if this can be filled out as soon as possible since she is unable to start work without it.

## 2014-10-07 ENCOUNTER — Ambulatory Visit: Payer: Self-pay

## 2014-10-07 NOTE — Progress Notes (Unsigned)
Tracy Weiss can by to pick up her form.  She hopes it will be completed very soon because she cannot go to work without it

## 2014-10-08 ENCOUNTER — Ambulatory Visit (INDEPENDENT_AMBULATORY_CARE_PROVIDER_SITE_OTHER): Payer: Self-pay | Admitting: *Deleted

## 2014-10-08 DIAGNOSIS — Z23 Encounter for immunization: Secondary | ICD-10-CM

## 2014-10-08 DIAGNOSIS — Z111 Encounter for screening for respiratory tuberculosis: Secondary | ICD-10-CM

## 2014-10-08 NOTE — Progress Notes (Signed)
   PPD placed Left Forearm.  Pt to return 10/11/2014 Monday at 8:30 AM for reading.  Pt tolerated intradermal injection. Derl Barrow, RN

## 2014-10-08 NOTE — Progress Notes (Unsigned)
Left voice message for pt informing her that she will need a TB skin test and Hep B #3 vaccine.  Pt can schedule an appt with nurse.  Derl Barrow, RN

## 2014-10-11 ENCOUNTER — Ambulatory Visit (INDEPENDENT_AMBULATORY_CARE_PROVIDER_SITE_OTHER): Payer: Self-pay | Admitting: *Deleted

## 2014-10-11 DIAGNOSIS — Z111 Encounter for screening for respiratory tuberculosis: Secondary | ICD-10-CM

## 2014-10-11 DIAGNOSIS — Z7689 Persons encountering health services in other specified circumstances: Secondary | ICD-10-CM

## 2014-10-11 NOTE — Progress Notes (Signed)
   PPD Reading Note PPD read and results entered in EpicCare. Result: 0 mm induration. Interpretation: Negative If test not read within 48-72 hours of initial placement, patient advised to repeat in other arm 1-3 weeks after this test. Allergic reaction: no  Lenwood Balsam L, RN  

## 2014-10-12 ENCOUNTER — Ambulatory Visit: Payer: Self-pay | Admitting: Pharmacist

## 2014-10-15 ENCOUNTER — Ambulatory Visit (INDEPENDENT_AMBULATORY_CARE_PROVIDER_SITE_OTHER): Payer: Self-pay | Admitting: Pharmacist

## 2014-10-15 ENCOUNTER — Ambulatory Visit (INDEPENDENT_AMBULATORY_CARE_PROVIDER_SITE_OTHER): Payer: Self-pay | Admitting: Family Medicine

## 2014-10-15 ENCOUNTER — Encounter: Payer: Self-pay | Admitting: Family Medicine

## 2014-10-15 ENCOUNTER — Encounter: Payer: Self-pay | Admitting: Pharmacist

## 2014-10-15 ENCOUNTER — Telehealth: Payer: Self-pay | Admitting: *Deleted

## 2014-10-15 VITALS — BP 123/79 | HR 83 | Ht 62.0 in | Wt 183.2 lb

## 2014-10-15 VITALS — BP 123/79 | HR 83 | Wt 183.2 lb

## 2014-10-15 DIAGNOSIS — F988 Other specified behavioral and emotional disorders with onset usually occurring in childhood and adolescence: Secondary | ICD-10-CM

## 2014-10-15 DIAGNOSIS — K589 Irritable bowel syndrome without diarrhea: Secondary | ICD-10-CM

## 2014-10-15 DIAGNOSIS — N644 Mastodynia: Secondary | ICD-10-CM | POA: Insufficient documentation

## 2014-10-15 DIAGNOSIS — E119 Type 2 diabetes mellitus without complications: Secondary | ICD-10-CM

## 2014-10-15 DIAGNOSIS — F909 Attention-deficit hyperactivity disorder, unspecified type: Secondary | ICD-10-CM

## 2014-10-15 DIAGNOSIS — R413 Other amnesia: Secondary | ICD-10-CM

## 2014-10-15 LAB — VITAMIN B12: Vitamin B-12: 451 pg/mL (ref 211–911)

## 2014-10-15 MED ORDER — INSULIN ASPART 100 UNIT/ML ~~LOC~~ SOLN: 10.0000 [IU] | SUBCUTANEOUS | Status: DC | PRN

## 2014-10-15 MED ORDER — METHYLPHENIDATE HCL ER (LA) 30 MG PO CP24: 30.0000 mg | ORAL_CAPSULE | ORAL | Status: DC

## 2014-10-15 MED ORDER — DAPAGLIFLOZIN PROPANEDIOL 5 MG PO TABS: 5.0000 mg | ORAL_TABLET | Freq: Every day | ORAL | Status: DC

## 2014-10-15 NOTE — Assessment & Plan Note (Addendum)
Diabetes diagnosed several years ago with improved control over the last few years due to diet and exercise and weight loss.  She appears to have stabilized her new weight ~ 180lbs and would like to lose more.  She feels like her central adiposity is due to poor bowel transit time.   Stopped her LANTUS as the dose was only 10 units daily and she exercises prior to eating in the morning.   She denies hypoglycemic events and is able to verbalize appropriate hypoglycemia management plan.  Reports adherence with medication. Control is suboptimal due to insulin augmentation of fuel storage.   She has excellent CBG and A1c control currently.    Willing an interested in starting another new medication option.   Wil order Dapagliflozin Wilder Glade) and attempt trial of this agent once obtained through [place]. [facility].     Follow up PRN with Pharmacy Clinic.  Encouraged more routine use of Novolog to achieve glycemic control.   Next A1C anticipated 2-3 months.   Written patient instructions provided.  Total time in face to face counseling 35 minutes.

## 2014-10-15 NOTE — Assessment & Plan Note (Signed)
-   Breast exam showed no masses - Suspect is musculoskeletal in origin and will improve on it's own. Recommended NSAIDs for pain - Gail Score showed 1.5% risk of breast cancer in next 5 years - Handout for mammogram given. May have screening mammogram to further evaluate pain. States she is going to wait until next menstrual cycle to see if pain changes and may then consider mammogram. Last mammogram July 2015 normal

## 2014-10-15 NOTE — Progress Notes (Signed)
Subjective:     Patient ID: Tracy Weiss, female   DOB: 1966-02-26, 49 y.o.   MRN: [id number]  HPI Tracy Weiss is a 49yo female presenting today for right breast tenderness. - Tenderness for one month - Located in Right lower quadrant of right breast - Has not noticed any changes in shape of breast - Has not noticed any lumps with palpation - Began menstruation at age 24. Has not yet entered menopause - Tenderness not associated with menstruation - Last mammogram in July 2015 normal - Had children before 50 years old - Mother had breast and stomach cancer in her 68s - States she was referred to a Copywriter, advertising in [place] for her bloating and possible colonoscopy. States she will never go to a doctor in [place]. Is attempting to go to another Gastroenterolist and may need forms in the future filled out. - Needs refill of ADHD medication. States it is working well. Currently in school for Masters. 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 Tracy Weiss to see Tracy Weiss in [facility] where referral to Neuropsychology was recommended. Tracy Weiss contacted Neuropsychologist who agreed to see Tracy Weiss. Recommended Vitamin B12 level prior to appointment.  Review of Systems  Gastrointestinal: Positive for constipation and abdominal distention.       Objective:   Physical Exam  Constitutional: She appears well-developed and well-nourished. No distress.  Pulmonary/Chest:  Breast exam showed no palpable masses. Tenderness in breast in right lower quadrant. Ribs elevated in right ribs over area of tenderness.  Psychiatric: She has a normal mood and affect. Her behavior is normal.       Assessment:     Please refer to Problem List for Assessment.    Plan:     Please refer to Problem List for Plan.

## 2014-10-15 NOTE — Progress Notes (Signed)
S:    Patient arrives in good spirits.    Presents for diabetes follow up after several rescheduled appointments.  She also has a visit scheduled today with Dr. Gerlean Ren.  Patient reports having history of Diabetes for many years which has improved greatly over the last few years as she has become highly engaged in her exercise and diet plan.   She expresses frustration with continued abdominal bloating and is concerned that she is unable to lose central adiposity.    She feels like her bowels do not move effectively.   Following a review of her diet regimen I believe she is consuming a moderately high amount of dietary fiber.   She inquires about the use of a medication from IBS called Linzess (linaclotide) which may be appropriate.  She is also concerned about her GI symptoms AND would like to have a referral to a gastroenterologist that takes the "orange card".  I deferred the GI issues for Dr. Johnathan Hausen visit scheduled later today.   Patient reports adherence with medications. Current diabetes medications include   Patient denies hypoglycemic events.  Patient reported dietary habits: of eating oatmeal, rarely toast but has minimal other carbohydrate per her report.  She eats salad, boiled eggs, broccoli, and raw vegetables.   All of these were encouraged.   Patient reported exercise habits: continues with "boot camp" 3 mornings per week.     O:  . Lab Results  Component Value Date   HGBA1C 6.4 08/17/2014     Home fasting CBG: 120-140 2 hour post-prandial/random CBG: 100-200 with majority of readings < 180.  A/P: Diabetes diagnosed several years ago with improved control over the last few years due to diet and exercise and weight loss.  She appears to have stabilized her new weight ~ 180lbs and would like to lose more.  She feels like her central adiposity is due to poor bowel transit time.   Stopped her LANTUS as the dose was only 10 units daily and she exercises prior to eating in the  morning.   She denies hypoglycemic events and is able to verbalize appropriate hypoglycemia management plan.  Reports adherence with medication. Control is suboptimal due to insulin augmentation of fuel storage.   She has excellent CBG and A1c control currently.    Willing an interested in starting another new medication option.   Wil order Dapagliflozin Wilder Glade) and attempt trial of this agent once obtained through [place]. [facility].     Follow up PRN with Pharmacy Clinic.  Encouraged more routine use of Novolog to achieve glycemic control.   Next A1C anticipated 2-3 months.   Written patient instructions provided.  Total time in face to face counseling 35 minutes.

## 2014-10-15 NOTE — Patient Instructions (Signed)
Thank you so much for following up with me!  I have refilled your ADHD medications. I have placed a neuropsychology referral and we will also check your Vitamin B12 level today.  I believe your right breast pain is most likely musculoskeletal and will improve with time. I will give you a form so you can call and schedule a mammogram if you choose.    Thanks again! Dr. Gerlean Ren

## 2014-10-15 NOTE — Patient Instructions (Signed)
Continue to take Novologup to 10 units with your breakfast,   10 units with your lunch and up to 10-15 units with dinner.   Hold the Lantus/Levemir - For now.   Good news for your blood sugar control.   Please have the Farxiga medication paperwork completed - at [facility] in the next few days.   Follow up with Dr Gerlean Ren later today.     Next follow up with Pharmacy clinic in March - as soon as you pick up your new medicine.   We may need to adjust your insulin again.

## 2014-10-15 NOTE — Assessment & Plan Note (Signed)
-   Prescriptions of Methylphenidate 30mg  given for three months - Referral to Neuropsychology placed for ADHD and Memory Problems. Vitamin B12 will be checked.

## 2014-10-15 NOTE — Assessment & Plan Note (Signed)
-   Discussed Linzess at Pharmacy appointment - Will call to discuss price. Suspect it is too expensive for her to afford, even with coupons. - Will discuss alternatives with pharmacy  - Tracy Weiss is working on transfer to another gastroenterologist and states she may need some forms filled out soon.

## 2014-10-15 NOTE — Telephone Encounter (Signed)
Received call from Tracy Weiss @ [facility] program needing clarification on directions of pts insulin.  Wants to know if pt is check sugars before every and administer as sliding scale or if she is to take these doses not matter what her glucose reading is. Fleeger, Salome Spotted

## 2014-10-16 NOTE — Progress Notes (Signed)
Patient ID: Tracy Weiss, female   DOB: 1966/05/17, 49 y.o.   MRN: [id number] Reviewed: Agree with the documentation and management of Dr. Valentina Lucks.

## 2014-10-19 NOTE — Telephone Encounter (Signed)
Dawn from [place] left message on nursing voice mail needing clarification in pt's Novolog directions.  States pt to take 10-15 Units as needed; 10 Units at breakfast, 10 Units at lunch and 15 Units at dinner.  Should pt use sliding scale according to blood glucose or automatically do the 10,10 and 15.  Please give her a call at [phone]. Derl Barrow, RN

## 2014-10-19 NOTE — Telephone Encounter (Signed)
Attempted to return call x2 with no response. Prescription is for 10 units at breakfast, 10units at lunch, and 15units with supper. Will reattempt tomorrow.

## 2014-10-22 NOTE — Telephone Encounter (Signed)
Attempted to return call x2 with no response. Will pass along to nursing to reattempt.

## 2014-10-26 NOTE — Telephone Encounter (Signed)
Attempted to call patient, no answer. Will close phone note due to multiple tries. IF patient does call about this issue, please inform her of below

## 2014-11-29 ENCOUNTER — Telehealth: Payer: Self-pay | Admitting: Family Medicine

## 2014-11-29 NOTE — Telephone Encounter (Signed)
Would like to know the status of neurologist referral Also the results of lab work done in [place] visit

## 2014-12-01 NOTE — Telephone Encounter (Signed)
Will forward to MD for lab results.  Referral is still being worked on.  [employer]

## 2014-12-01 NOTE — Telephone Encounter (Signed)
Per pcp:    Please let Mrs. Devins know that her vitamin B12 level checked in February was normal.     LM for patient to call back and get results and to be informed of her referral.  Jazmin Hartsell,CMA

## 2014-12-02 NOTE — Telephone Encounter (Signed)
Spoke with patient and informed her of below message 

## 2014-12-15 ENCOUNTER — Other Ambulatory Visit: Payer: Self-pay | Admitting: *Deleted

## 2014-12-15 ENCOUNTER — Telehealth: Payer: Self-pay | Admitting: Family Medicine

## 2014-12-15 DIAGNOSIS — I10 Essential (primary) hypertension: Secondary | ICD-10-CM

## 2014-12-15 MED ORDER — OLMESARTAN MEDOXOMIL-HCTZ 40-25 MG PO TABS: 1.0000 | ORAL_TABLET | Freq: Every day | ORAL | Status: DC

## 2014-12-15 NOTE — Telephone Encounter (Signed)
Prescription for Olmesartan-HCTZ filled and left at front desk.

## 2014-12-16 NOTE — Telephone Encounter (Signed)
Called patient to discuss referral situation.  I put an epic message in to Dr. Valentina Shaggy to assess the status of the referral and remind him of our conversation we had in January.  I had to leave a VM for Ms. Morneault and unfortunately, will be out of the office tomorrow if she calls back to get details.  I told her I would document the status and she could speak to April about it.  Additionally, I let her know that we would continue to work on it until the issue is resolved.

## 2014-12-16 NOTE — Telephone Encounter (Signed)
Contacted pt to inform her of below and she preceded to ask me about her referral to neurology (referral is to neuropsychology). She stated that she is very frustrated with the process of getting her referral.  She stated that we should have better training because she feels like the ball keeps getting dropped.  She stated that Dr. Gwenlyn Saran had made contact with a doctor that would agree to see her with the orange card and was hoping to have it done by April. She said it is now April and she hasn't heard anything about her appt, It looks like in the referral that some resources had been mailed out to her on 12/06/14.  I told her this and she stated that she did not need resources she needs [name] to let her know when her appt will be.  She stated that if she were someone with a brain tumor needing a referral she would be dead before she heard from [name].  I expressed empathy with her and asked if she would like to speak to someone about this and she stated that they could contact her, but to be sure that the person calling her had some answers before contacting her.  I spoke with Dr. Gwenlyn Saran and she was familiar with her situation and she stated she would look into it and advise the pt of the information that she is inquiring about. Katharina Caper, April D

## 2014-12-17 ENCOUNTER — Ambulatory Visit: Payer: No Typology Code available for payment source | Admitting: Podiatry

## 2014-12-17 ENCOUNTER — Encounter: Payer: Self-pay | Admitting: Podiatry

## 2014-12-17 ENCOUNTER — Telehealth: Payer: Self-pay | Admitting: Family Medicine

## 2014-12-17 VITALS — BP 127/81 | HR 88 | Resp 18

## 2014-12-17 DIAGNOSIS — M205X9 Other deformities of toe(s) (acquired), unspecified foot: Secondary | ICD-10-CM

## 2014-12-17 DIAGNOSIS — M79676 Pain in unspecified toe(s): Secondary | ICD-10-CM

## 2014-12-17 NOTE — Patient Instructions (Signed)
Pre-Operative Instructions  Congratulations, you have decided to take an important step to improving your quality of life.  You can be assured that the doctors of [facility] will be with you every step of the way.  1. Plan to be at the surgery center/hospital at least 1 (one) hour prior to your scheduled time unless otherwise directed by the surgical center/hospital staff.  You must have a responsible adult accompany you, remain during the surgery and drive you home.  Make sure you have directions to the surgical center/hospital and know how to get there on time. 2. For hospital based surgery you will need to obtain a history and physical form from your family physician within 1 month prior to the date of surgery- we will give you a form for you primary physician.  3. We make every effort to accommodate the date you request for surgery.  There are however, times where surgery dates or times have to be moved.  We will contact you as soon as possible if a change in schedule is required.   4. No Aspirin/Ibuprofen for one week before surgery.  If you are on aspirin, any non-steroidal anti-inflammatory medications (Mobic, Aleve, Ibuprofen) you should stop taking it 7 days prior to your surgery.  You make take Tylenol  For pain prior to surgery.  5. Medications- If you are taking daily heart and blood pressure medications, seizure, reflux, allergy, asthma, anxiety, pain or diabetes medications, make sure the surgery center/hospital is aware before the day of surgery so they may notify you which medications to take or avoid the day of surgery. 6. No food or drink after midnight the night before surgery unless directed otherwise by surgical center/hospital staff. 7. No alcoholic beverages 24 hours prior to surgery.  No smoking 24 hours prior to or 24 hours after surgery. 8. Wear loose pants or shorts- loose enough to fit over bandages, boots, and casts. 9. No slip on shoes, sneakers are best. 10. Bring  your boot with you to the surgery center/hospital.  Also bring crutches or a walker if your physician has prescribed it for you.  If you do not have this equipment, it will be provided for you after surgery. 11. If you have not been contracted by the surgery center/hospital by the day before your surgery, call to confirm the date and time of your surgery. 12. Leave-time from work may vary depending on the type of surgery you have.  Appropriate arrangements should be made prior to surgery with your employer. 13. Prescriptions will be provided immediately following surgery by your doctor.  Have these filled as soon as possible after surgery and take the medication as directed. 14. Remove nail polish on the operative foot. 15. Wash the night before surgery.  The night before surgery wash the foot and leg well with the antibacterial soap provided and water paying special attention to beneath the toenails and in between the toes.  Rinse thoroughly with water and dry well with a towel.  Perform this wash unless told not to do so by your physician.  Enclosed: 1 Ice pack (please put in freezer the night before surgery)   1 Hibiclens skin cleaner   Pre-op Instructions  If you have any questions regarding the instructions, do not hesitate to call our office.  [place]: [address] [place], [place] [address] [phone]  [place]: [address] [place], [place] [address] [phone]  [place]: [address]  [place], [place] [address] [phone]  Dr. Richard   Tuchman DPM, Dr. Ila Mcgill DPM Dr. Harriet Masson DPM, Dr. Lanelle Bal DPM, Dr. Trudie Buckler DPM, Dr. Celesta Gentile, DPM

## 2014-12-20 ENCOUNTER — Telehealth: Payer: Self-pay | Admitting: Psychology

## 2014-12-20 ENCOUNTER — Encounter: Payer: Self-pay | Admitting: Psychology

## 2014-12-20 NOTE — Progress Notes (Signed)
Patient ID: Tracy Weiss, female   DOB: 1966/08/08, 49 y.o.   MRN: [id number]  Subjective: 49 year old female presents the office they discuss surgical correction of bilateral fifth toe deformity. She states that she has pain to the outer aspect of her fifth toe particularly with shoe gear and pressure. She states the toes become irritated times. She has tried padding offloading without much resolution and is inquiring about surgical intervention. No acute changes since last appointment no other complaints at this time.  Objective: AAO x3, NAD DP/PT pulses palpable bilaterally, CRT less than 3 seconds Protective sensation intact with Simms Weinstein monofilament, vibratory sensation intact, Achilles tendon reflex intact There is adductovarus deformity of bilateral fifth digits with mild irritation dorsal lateral aspect of the fifth toe. There is mild tenderness to palpation overlying this area.  No areas of tenderness to bilateral lower extremities. MMT 5/5, ROM WNL.  No open lesions or pre-ulcerative lesions.  No overlying edema, erythema, increase in warmth to bilateral lower extremities.  No pain with calf compression, swelling, warmth, erythema bilaterally.   Assessment: 49 year old female bilateral symptomatic adductovarus of the fifth digit  Plan: -X-rays were reviewed with the patient -Treatment options both conservative and surgical were discussed the patient alternatives, risks, complications -At this time discussed surgical intervention. The proposed surgery is lateral fifth digit PIPJ arthroplasty with derotational skinplasty. -The incision placement as well as the postoperative course was discussed with the patient. I discussed risks of the surgery which include, but not limited to, infection, bleeding, pain, swelling, need for further surgery, delayed or nonhealing, painful or ugly scar, numbness or sensation changes, over/under correction, recurrence, transfer lesions, further  deformity, hardware failure, DVT/PE, also toe/foot. Patient understands these risks and wishes to proceed with surgery. The surgical consent was reviewed with the patient all 3 pages were signed. No promises or guarantees were given to the outcome of the procedure. All questions were answered to the best of my ability. Before the surgery the patient was encouraged to call the office if there is any further questions. The surgery will be performed at the [facility] on an outpatient basis. She was scheduled for March 21, 2015.

## 2014-12-20 NOTE — Progress Notes (Signed)
Erroneous documentation

## 2014-12-20 NOTE — Telephone Encounter (Signed)
Dr. Valentina Shaggy tracked down the referral for neuropsychological testing and contacted the patient.  She is scheduled for July to see him.  See phone note dated 4/13 for further information.

## 2014-12-21 ENCOUNTER — Ambulatory Visit: Payer: Self-pay

## 2014-12-22 NOTE — Telephone Encounter (Signed)
Returned call to patient.  Patient stated she was frustrated that "we had dropped the ball" with her referral.  Apologized to patient and informed patient that referral had been placed to Dr. Valentina Shaggy in February and his office was supposed to contact her regarding appt per Dr. Tod Persia discussion with him on 09/06/14.  Read note to patient and she verbalized understanding.  Informed patient to call back if she has additional concerns regarding future referrals.  Patient verbalized understanding and agreeable to plan.  Burna Forts, BSN, RN-BC

## 2014-12-23 ENCOUNTER — Ambulatory Visit (INDEPENDENT_AMBULATORY_CARE_PROVIDER_SITE_OTHER): Payer: Self-pay | Admitting: Family Medicine

## 2014-12-23 ENCOUNTER — Encounter: Payer: Self-pay | Admitting: Family Medicine

## 2014-12-23 VITALS — BP 118/80 | HR 80 | Temp 98.6°F | Ht 61.0 in | Wt 177.0 lb

## 2014-12-23 DIAGNOSIS — E119 Type 2 diabetes mellitus without complications: Secondary | ICD-10-CM

## 2014-12-23 DIAGNOSIS — I10 Essential (primary) hypertension: Secondary | ICD-10-CM

## 2014-12-23 NOTE — Patient Instructions (Addendum)
Thank you so much for coming to visit me today!  I will address your concerns with Dr. Valentina Lucks concerning Benicar and Wilder Glade and contact you soon!  The number to [facility] is [phone]  Please let me know if there's anything else I can do for you! Good luck with those kiddos in school!  Dr. Gerlean Ren

## 2014-12-27 NOTE — Assessment & Plan Note (Signed)
-   Discussed with Clinical Pharmacist, Koval. Wished to leave office and be contacted with results of discussion. - Will offer switch to another SGLT2 Inhibitor or discontinuation all together. Offer re-attempting for longer period of time, since only took for a few days. Initially offered due to benefit of weight loss. - Follow up A1C in June

## 2014-12-27 NOTE — Assessment & Plan Note (Signed)
-   BP normal today - Will discontinue Benicar due to lack of compliance - Continue to monitor kidney function

## 2014-12-27 NOTE — Progress Notes (Signed)
Subjective:     Patient ID: Tracy Weiss, female   DOB: 1965-12-28, 49 y.o.   MRN: [id number]  HPI Tracy Weiss is a 49yo female presenting today to discuss her medications. - Goes to Pharmacy Clinic frequently for medication management - Benicar- states she has not been taking this regularly, usually takes it 3-4 times a month, only when she notes swelling in one of her legs, feels that BP has not been elevated and does not want to take this medication even though it is supposed to help her kidneys with history of diabetes - Wilder Glade- recently started in Pharmacy Clinic. States she never took the full dose prescribed but has instead taken half a tablet and tried it for a few days before discontinuing it, notes that it increased urination significantly and did not affect her blood sugars - Plans to return in June for A1C check  Review of Systems  Genitourinary: Positive for frequency.       Objective:   Physical Exam  Constitutional: She appears well-developed and well-nourished. No distress.  Cardiovascular: Normal rate.  Exam reveals no gallop and no friction rub.   No murmur heard. Pulmonary/Chest: Effort normal. No respiratory distress. She has no wheezes. She has no rales.  Musculoskeletal: She exhibits no edema.  Psychiatric: She has a normal mood and affect. Her behavior is normal.       Assessment:     Please refer to Problem List for Assessment.    Plan:     Please refer to Problem List for Plan

## 2014-12-28 ENCOUNTER — Telehealth: Payer: Self-pay | Admitting: Family Medicine

## 2014-12-28 NOTE — Telephone Encounter (Signed)
Attempted calling x3 with no response.

## 2014-12-30 ENCOUNTER — Encounter (HOSPITAL_COMMUNITY): Payer: Self-pay

## 2014-12-30 ENCOUNTER — Emergency Department (HOSPITAL_COMMUNITY)
Admission: EM | Admit: 2014-12-30 | Discharge: 2014-12-30 | Disposition: A | Discharge status: Home / Self Care | Payer: Self-pay | Attending: Emergency Medicine | Admitting: Emergency Medicine

## 2014-12-30 ENCOUNTER — Emergency Department (HOSPITAL_COMMUNITY): Payer: Self-pay

## 2014-12-30 DIAGNOSIS — Z79899 Other long term (current) drug therapy: Secondary | ICD-10-CM | POA: Insufficient documentation

## 2014-12-30 DIAGNOSIS — E119 Type 2 diabetes mellitus without complications: Secondary | ICD-10-CM | POA: Insufficient documentation

## 2014-12-30 DIAGNOSIS — Z794 Long term (current) use of insulin: Secondary | ICD-10-CM | POA: Insufficient documentation

## 2014-12-30 DIAGNOSIS — Z87891 Personal history of nicotine dependence: Secondary | ICD-10-CM | POA: Insufficient documentation

## 2014-12-30 DIAGNOSIS — R05 Cough: Secondary | ICD-10-CM

## 2014-12-30 DIAGNOSIS — B349 Viral infection, unspecified: Secondary | ICD-10-CM | POA: Insufficient documentation

## 2014-12-30 DIAGNOSIS — J029 Acute pharyngitis, unspecified: Secondary | ICD-10-CM | POA: Insufficient documentation

## 2014-12-30 DIAGNOSIS — R059 Cough, unspecified: Secondary | ICD-10-CM

## 2014-12-30 LAB — RAPID STREP SCREEN (MED CTR MEBANE ONLY): Streptococcus, Group A Screen (Direct): NEGATIVE

## 2014-12-30 MED ORDER — BENZONATATE 100 MG PO CAPS: 100.0000 mg | ORAL_CAPSULE | Freq: Three times a day (TID) | ORAL | Status: DC

## 2014-12-30 MED ORDER — IBUPROFEN 400 MG PO TABS: 400.0000 mg | ORAL_TABLET | Freq: Four times a day (QID) | ORAL | Status: DC | PRN

## 2014-12-30 MED ORDER — DEXAMETHASONE SODIUM PHOSPHATE 10 MG/ML IJ SOLN
10.0000 mg | Freq: Once | INTRAMUSCULAR | Status: AC
  Administered 2014-12-30: 10 mg via INTRAMUSCULAR
  Filled 2014-12-30: qty 1

## 2014-12-30 MED ORDER — MAGIC MOUTHWASH
10.0000 mL | Freq: Once | ORAL | Status: AC
  Administered 2014-12-30: 10 mL via ORAL
  Filled 2014-12-30 (×2): qty 10

## 2014-12-30 MED ORDER — DEXAMETHASONE SODIUM PHOSPHATE 10 MG/ML IJ SOLN: 10.0000 mg | Freq: Once | INTRAMUSCULAR | Status: DC

## 2014-12-30 NOTE — Discharge Instructions (Signed)
The Xrays are normal. The strep screen is normal. Please take the medicine prescribed. Push fluids. You will likely get better over the course of few days.   Pharyngitis Pharyngitis is redness, pain, and swelling (inflammation) of your pharynx.  CAUSES  Pharyngitis is usually caused by infection. Most of the time, these infections are from viruses (viral) and are part of a cold. However, sometimes pharyngitis is caused by bacteria (bacterial). Pharyngitis can also be caused by allergies. Viral pharyngitis may be spread from person to person by coughing, sneezing, and personal items or utensils (cups, forks, spoons, toothbrushes). Bacterial pharyngitis may be spread from person to person by more intimate contact, such as kissing.  SIGNS AND SYMPTOMS  Symptoms of pharyngitis include:   Sore throat.   Tiredness (fatigue).   Low-grade fever.   Headache.  Joint pain and muscle aches.  Skin rashes.  Swollen lymph nodes.  Plaque-like film on throat or tonsils (often seen with bacterial pharyngitis). DIAGNOSIS  Your health care provider will ask you questions about your illness and your symptoms. Your medical history, along with a physical exam, is often all that is needed to diagnose pharyngitis. Sometimes, a rapid strep test is done. Other lab tests may also be done, depending on the suspected cause.  TREATMENT  Viral pharyngitis will usually get better in 3-4 days without the use of medicine. Bacterial pharyngitis is treated with medicines that kill germs (antibiotics).  HOME CARE INSTRUCTIONS   Drink enough water and fluids to keep your urine clear or pale yellow.   Only take over-the-counter or prescription medicines as directed by your health care provider:   If you are prescribed antibiotics, make sure you finish them even if you start to feel better.   Do not take aspirin.   Get lots of rest.   Gargle with 8 oz of salt water ( tsp of salt per 1 qt of water) as  often as every 1-2 hours to soothe your throat.   Throat lozenges (if you are not at risk for choking) or sprays may be used to soothe your throat. SEEK MEDICAL CARE IF:   You have large, tender lumps in your neck.  You have a rash.  You cough up green, yellow-brown, or bloody spit. SEEK IMMEDIATE MEDICAL CARE IF:   Your neck becomes stiff.  You drool or are unable to swallow liquids.  You vomit or are unable to keep medicines or liquids down.  You have severe pain that does not go away with the use of recommended medicines.  You have trouble breathing (not caused by a stuffy nose). MAKE SURE YOU:   Understand these instructions.  Will watch your condition.  Will get help right away if you are not doing well or get worse. Document Released: 08/20/2005 Document Revised: 06/10/2013 Document Reviewed: 04/27/2013 [facility] Patient Information 2015 [place], [place]. This information is not intended to replace advice given to you by your health care provider. Make sure you discuss any questions you have with your health care provider.  Viral Infections A viral infection can be caused by different types of viruses.Most viral infections are not serious and resolve on their own. However, some infections may cause severe symptoms and may lead to further complications. SYMPTOMS Viruses can frequently cause:  Minor sore throat.  Aches and pains.  Headaches.  Runny nose.  Different types of rashes.  Watery eyes.  Tiredness.  Cough.  Loss of appetite.  Gastrointestinal infections, resulting in nausea, vomiting, and diarrhea. These  symptoms do not respond to antibiotics because the infection is not caused by bacteria. However, you might catch a bacterial infection following the viral infection. This is sometimes called a "superinfection." Symptoms of such a bacterial infection may include:  Worsening sore throat with pus and difficulty swallowing.  Swollen neck  glands.  Chills and a high or persistent fever.  Severe headache.  Tenderness over the sinuses.  Persistent overall ill feeling (malaise), muscle aches, and tiredness (fatigue).  Persistent cough.  Yellow, green, or brown mucus production with coughing. HOME CARE INSTRUCTIONS   Only take over-the-counter or prescription medicines for pain, discomfort, diarrhea, or fever as directed by your caregiver.  Drink enough water and fluids to keep your urine clear or pale yellow. Sports drinks can provide valuable electrolytes, sugars, and hydration.  Get plenty of rest and maintain proper nutrition. Soups and broths with crackers or rice are fine. SEEK IMMEDIATE MEDICAL CARE IF:   You have severe headaches, shortness of breath, chest pain, neck pain, or an unusual rash.  You have uncontrolled vomiting, diarrhea, or you are unable to keep down fluids.  You or your child has an oral temperature above 102 F (38.9 C), not controlled by medicine.  Your baby is older than 3 months with a rectal temperature of 102 F (38.9 C) or higher.  Your baby is 63 months old or younger with a rectal temperature of 100.4 F (38 C) or higher. MAKE SURE YOU:   Understand these instructions.  Will watch your condition.  Will get help right away if you are not doing well or get worse. Document Released: 05/30/2005 Document Revised: 11/12/2011 Document Reviewed: 12/25/2010 [facility] Patient Information 2015 [place], [place]. This information is not intended to replace advice given to you by your health care provider. Make sure you discuss any questions you have with your health care provider.

## 2014-12-30 NOTE — ED Notes (Signed)
Patient c/o sore throat and a productive cough with yellow and blood tinged sputum x 4 days.

## 2014-12-30 NOTE — ED Provider Notes (Signed)
CSN: [id number]     Arrival date & time 12/30/14  0715 History   First MD Initiated Contact with Patient 12/30/14 [phone]     Chief Complaint  Patient presents with  . Sore Throat  . Cough     (Consider location/radiation/quality/duration/timing/severity/associated sxs/prior Treatment) HPI Comments: Pt also has cough. She reports that now with her cough there is some blood mixed in the phlegm. No dib, wheezing, hx of asthma, copd or smoking. Pt has no hx of PE, DVT and denies any exogenous estrogen use, long distance travels or surgery in the past 6 weeks, active cancer, recent immobilization.   Patient is a 49 y.o. female presenting with pharyngitis and cough. The history is provided by the patient.  Sore Throat This is a new problem. The current episode started more than 2 days ago. The problem occurs constantly. The problem has not changed since onset.Pertinent negatives include no chest pain, no headaches and no shortness of breath. The symptoms are aggravated by swallowing. Nothing relieves the symptoms. She has tried nothing for the symptoms.  Cough Associated symptoms: no chest pain, no fever, no headaches and no shortness of breath     Past Medical History  Diagnosis Date  . Diabetes mellitus without complication    Past Surgical History  Procedure Laterality Date  . Cesarean section     Family History  Problem Relation Age of Onset  . Cancer Mother     Metastatic with Unknown Primary; Stomach was involved  . Diabetes Mother   . Diabetes Father   . Heart disease Father   . Diabetes Maternal Grandmother   . Diabetes Maternal Grandfather   . Diabetes Paternal Grandmother   . Diabetes Paternal Grandfather    History  Substance Use Topics  . Smoking status: Former Smoker -- 0.50 packs/day for 10 years    Types: Cigarettes    Start date: 09/03/1982    Quit date: 09/04/1983  . Smokeless tobacco: Never Used  . Alcohol Use: No   OB History    No data available      Review of Systems  Constitutional: Negative for fever.  Respiratory: Positive for cough. Negative for shortness of breath.   Cardiovascular: Negative for chest pain.  Gastrointestinal: Negative for nausea.  Neurological: Negative for headaches.      Allergies  Review of patient's allergies indicates no known allergies.  Home Medications   Prior to Admission medications   Medication Sig Start Date End Date Taking? Authorizing Provider  Cholecalciferol (VITAMIN D PO) Take by mouth daily.    Historical Provider, MD  dapagliflozin propanediol (FARXIGA) 5 MG TABS tablet Take 5 mg by mouth daily. Patient not taking: Reported on 12/27/2014 10/15/14   Zenia Resides, MD  insulin aspart (NOVOLOG) 100 UNIT/ML injection Inject 10-15 Units into the skin as needed for high blood sugar. 10 breakfast, 10 lunch and 15 units with dinner 10/15/14   Zenia Resides, MD  insulin detemir (LEVEMIR) 100 UNIT/ML injection Inject 0.15 mLs (15 Units total) into the skin daily. Patient not taking: Reported on 10/15/2014 09/23/14   Burna Cash Rumley, DO  Liraglutide 18 MG/3ML SOPN Inject 1.8 mg into the skin daily. 09/09/14   [place] N Rumley, DO  magnesium 30 MG tablet Take 30 mg by mouth. Takes as needed a few days per month.    Historical Provider, MD  methylphenidate (RITALIN LA) 30 MG 24 hr capsule Take 1 capsule (30 mg total) by mouth every morning. 10/15/14  [place] [employer] [facility], DO  methylphenidate (RITALIN LA) 30 MG 24 hr capsule Take 1 capsule (30 mg total) by mouth every morning. To be filled 60 days from date prescription given. 10/15/14   [place] N Rumley, DO  methylphenidate (RITALIN LA) 30 MG 24 hr capsule Take 1 capsule (30 mg total) by mouth every morning. To be filled on date prescription given 10/15/14   [place] N Rumley, DO   BP 124/76 mmHg  Pulse 84  Temp(Src) 98.4 F (36.9 C) (Oral)  Resp 16  SpO2 99%  LMP 12/17/2014 Physical Exam  Constitutional: She is oriented to person, place, and time.  She appears well-developed and well-nourished.  HENT:  Head: Normocephalic and atraumatic.  Mouth/Throat: Oropharynx is clear and moist. No oropharyngeal exudate.  Eyes: EOM are normal.  Neck: Neck supple.  Cardiovascular: Normal rate, regular rhythm and normal heart sounds.   No murmur heard. Pulmonary/Chest: Effort normal. No respiratory distress. She has no wheezes.  Lymphadenopathy:    She has cervical adenopathy.  Neurological: She is alert and oriented to person, place, and time.  Skin: Skin is warm and dry.  Nursing note and vitals reviewed.   ED Course  Procedures (including critical care time) Labs Review Labs Reviewed  RAPID STREP SCREEN    Imaging Review No results found.   EKG Interpretation None      MDM   Final diagnoses:  Pharyngitis  Viral syndrome  Cough    Pt with hemoptysis and sore throat. She has no fevers. Rapid strep ordered from triage, with no fevers, + cough, no exudates, doubt strep throat. CXR ordered as well, if neg, pt will be treated as viral syndrome.    Varney Biles, MD 12/30/14 [phone]

## 2015-01-01 LAB — CULTURE, GROUP A STREP: Strep A Culture: NEGATIVE

## 2015-01-21 ENCOUNTER — Telehealth: Payer: Self-pay | Admitting: *Deleted

## 2015-01-21 NOTE — Telephone Encounter (Signed)
I called and left patient a message to call me to reschedule an appointment.  I need to tell her we will need to change surgery center for July 18th.  We will not be able to do surgery at [facility] because patient doesn't have insurance.  Surgery will need to be done at [facility].  Patient will need to have a history and physical form completed by her primary care physician prior to surgery.

## 2015-01-25 NOTE — Telephone Encounter (Signed)
I left patient another message to call me back.  I need to reschedule her surgery to [place].

## 2015-02-04 ENCOUNTER — Other Ambulatory Visit: Payer: Self-pay | Admitting: Family Medicine

## 2015-02-04 DIAGNOSIS — Z1231 Encounter for screening mammogram for malignant neoplasm of breast: Secondary | ICD-10-CM

## 2015-02-08 ENCOUNTER — Ambulatory Visit: Payer: Self-pay | Admitting: Family Medicine

## 2015-02-09 ENCOUNTER — Other Ambulatory Visit: Payer: Self-pay | Admitting: *Deleted

## 2015-02-09 ENCOUNTER — Encounter: Payer: Self-pay | Admitting: Family Medicine

## 2015-02-09 NOTE — Progress Notes (Signed)
Patient ID: Tracy Weiss, female   DOB: 10/24/1965, 49 y.o.   MRN: [id number] Received letter from [facility] stating that [name] challenged denial of Lantus coverage and was instead placed on Levemir. After reconsideration, has been approved for Lantus coverage. Lantus discontinued in February since [name] was not using it. Will not prescribe Lantus or Levemir at this time.  Dr. Gerlean Ren

## 2015-02-09 NOTE — Telephone Encounter (Signed)
"  I'm scheduled for surgery the end of June.  I need to cancel this appointment."  I returned her call.  "I need to reschedule my surgery from the end of June."  We actually had you scheduled for 03/21/2015.  I had tried to call you to reschedule it because we can't do it at [facility] due to you not having insurance.  "I was told that my surgery would be covered if performed at a [facility] is not a [facility] facility.  "Oh, well maybe this works out after all.  I'd like to reschedule it sometime in August."  Okay, he can do it 04/06/2015 or 04/13/2015.  "I don't do anything during the first week of the month.  I'll take 04/13/2015."  Okay, I'll get it scheduled for 04/13/2015.  You will need to have a physical done by your primary care physician.  I just had one done a couple of months ago.  It has to be done within a 30 day period.  "Okay, I'll call and get it rescheduled so I won't have to keep scheduling appointments over and over again."  You'll have to come by to pick up the history/ physical form, I'll leave it at front desk.  "Can you just mail it to me?"  Okay, I'll put it in the mail.  "What time will it be done?"  The surgical center will call you a day or 2 prior to surgery date and let you know what time to arrive.  I have to see if that date is available at the surgical center.  If not, I'll let you know.  I put the history and physical form in the mail.

## 2015-02-22 ENCOUNTER — Telehealth: Payer: Self-pay | Admitting: Family Medicine

## 2015-02-22 NOTE — Telephone Encounter (Signed)
Needs refill on lantus. Goes thru [place] program [phone]

## 2015-02-24 ENCOUNTER — Ambulatory Visit: Payer: Self-pay

## 2015-02-24 NOTE — Telephone Encounter (Signed)
Attempted to contacted number given but did not go through and appears to be a wrong number. At last office visit was not using Lantus, so will not refill this at this time.

## 2015-02-28 ENCOUNTER — Encounter: Payer: Self-pay | Admitting: Family Medicine

## 2015-02-28 ENCOUNTER — Ambulatory Visit (INDEPENDENT_AMBULATORY_CARE_PROVIDER_SITE_OTHER): Payer: Self-pay | Admitting: Family Medicine

## 2015-02-28 VITALS — BP 122/61 | HR 92 | Temp 98.3°F | Ht 61.0 in | Wt 181.0 lb

## 2015-02-28 DIAGNOSIS — E119 Type 2 diabetes mellitus without complications: Secondary | ICD-10-CM

## 2015-02-28 LAB — POCT UA - MICROSCOPIC ONLY

## 2015-02-28 LAB — POCT URINALYSIS DIPSTICK
Bilirubin, UA: NEGATIVE
Blood, UA: NEGATIVE
Glucose, UA: NEGATIVE
Ketones, UA: NEGATIVE
Leukocytes, UA: NEGATIVE
Nitrite, UA: NEGATIVE
Protein, UA: 30
Spec Grav, UA: 1.02
Urobilinogen, UA: 0.2
pH, UA: 7

## 2015-02-28 LAB — TSH: TSH: 0.621 u[IU]/mL (ref 0.350–4.500)

## 2015-02-28 LAB — POCT GLYCOSYLATED HEMOGLOBIN (HGB A1C): Hemoglobin A1C: 7.3

## 2015-02-28 MED ORDER — EMPAGLIFLOZIN 10 MG PO TABS: 10.0000 mg | ORAL_TABLET | Freq: Every day | ORAL | Status: DC

## 2015-02-28 MED ORDER — INSULIN GLARGINE 100 UNIT/ML ~~LOC~~ SOLN: 15.0000 [IU] | Freq: Every day | SUBCUTANEOUS | Status: DC

## 2015-02-28 NOTE — Patient Instructions (Signed)
Thank you so much for coming to visit today! Your A1C is 7.3% today, increased from 6.4% six months ago. After discussing with a Pharmacist, we decided to start [place] today. I have included information about this below. I will contact the [facility] Department for this prescription and your Lantus. We will also check your Thyroid and Urine today. I will mail you the results. Please continue to work on your diet and exercise. Please follow up in 3 months to recheck your A1C.  Dr. Gerlean Ren  Empagliflozin oral tablets What is this medicine? EMPAGLIGLOZIN (EM pa gli FLOE zin) helps to treat type 2 diabetes. It helps to control blood sugar. Treatment is combined with diet and exercise. This medicine may be used for other purposes; ask your health care provider or pharmacist if you have questions. COMMON BRAND NAME(S): JARDIANCE What should I tell my health care provider before I take this medicine? They need to know if you have any of these conditions: -dehydration -diabetic ketoacidosis -diet low in salt -high cholesterol -high levels of potassium in the blood -history of yeast infection of the penis or vagina -kidney disease -liver disease -low blood pressure -on hemodialysis -type 1 diabetes -uncircumcised female -an unusual or allergic reaction to empagliflozin, other medicines, foods, dyes, or preservatives -pregnant or trying to get pregnant -breast-feeding How should I use this medicine? Take this medicine by mouth with a glass of water. Follow the directions on the prescription label. Take it in the morning, with or without food. Take your dose at the same time each day. Do not take more often than directed. Do not stop taking except on your doctor's advice. Talk to your pediatrician regarding the use of this medicine in children. Special care may be needed. Overdosage: If you think you've taken too much of this medicine contact a poison control center or emergency room at  once. Overdosage: If you think you have taken too much of this medicine contact a poison control center or emergency room at once. NOTE: This medicine is only for you. Do not share this medicine with others. What if I miss a dose? If you miss a dose, take it as soon as you can. If it is almost time for your next dose, take only that dose. Do not take double or extra doses. What may interact with this medicine? Do not take this medicine with any of the following medications: -gatifloxacin This medicine may also interact with the following medications: -alcohol -certain medicines for blood pressure, heart disease -diuretics This list may not describe all possible interactions. Give your health care provider a list of all the medicines, herbs, non-prescription drugs, or dietary supplements you use. Also tell them if you smoke, drink alcohol, or use illegal drugs. Some items may interact with your medicine. What should I watch for while using this medicine? Visit your doctor or health care professional for regular checks on your progress. A test called the HbA1C (A1C) will be monitored. This is a simple blood test. It measures your blood sugar control over the last 2 to 3 months. You will receive this test every 3 to 6 months. Learn how to check your blood sugar. Learn the symptoms of low and high blood sugar and how to manage them. Always carry a quick-source of sugar with you in case you have symptoms of low blood sugar. Examples include hard sugar candy or glucose tablets. Make sure others know that you can choke if you eat or drink when you develop  serious symptoms of low blood sugar, such as seizures or unconsciousness. They must get medical help at once. Tell your doctor or health care professional if you have high blood sugar. You might need to change the dose of your medicine. If you are sick or exercising more than usual, you might need to change the dose of your medicine. Do not skip meals.  Ask your doctor or health care professional if you should avoid alcohol. Many nonprescription cough and cold products contain sugar or alcohol. These can affect blood sugar. Wear a medical ID bracelet or chain, and carry a card that describes your disease and details of your medicine and dosage times. What side effects may I notice from receiving this medicine? Side effects that you should report to your doctor or health care professional as soon as possible: -allergic reactions like skin rash, itching or hives, swelling of the face, lips, or tongue -breathing problems -dizziness -fast or irregular heartbeat -feeling faint or lightheaded, falls -fever, chills -muscle weakness -signs and symptoms of low blood sugar such as feeling anxious, confusion, dizziness, increased hunger, unusually weak or tired, sweating, shakiness, cold, irritable, headache, blurred vision, fast heartbeat, loss of consciousness -trouble passing urine or change in the amount of urine -penile discharge, itching, or pain in men -vaginal discharge, itching, or odor in women Side effects that usually do not require medical attention (Report these to your doctor or health care professional if they continue or are bothersome.): -increased urination -nausea -thirsty This list may not describe all possible side effects. Call your doctor for medical advice about side effects. You may report side effects to FDA at 1-800-FDA-1088. Where should I keep my medicine? Keep out of the reach of children. Store at room temperature between 20 and 25 degrees C (68 and 77 degrees F). Throw away any unused medicine after the expiration date. NOTE: This sheet is a summary. It may not cover all possible information. If you have questions about this medicine, talk to your doctor, pharmacist, or health care provider.  2015, [employer]. (2013-10-15 11:22:09)

## 2015-03-01 ENCOUNTER — Telehealth: Payer: Self-pay | Admitting: Family Medicine

## 2015-03-01 ENCOUNTER — Telehealth: Payer: Self-pay | Admitting: *Deleted

## 2015-03-01 LAB — MICROALBUMIN / CREATININE URINE RATIO
Creatinine, Urine: 413.7 mg/dL
Microalb Creat Ratio: 6 mg/g (ref 0.0–30.0)
Microalb, Ur: 2.5 mg/dL — ABNORMAL HIGH (ref <2.0)

## 2015-03-01 MED ORDER — INSULIN ASPART 100 UNIT/ML ~~LOC~~ SOLN
10.0000 [IU] | Freq: Three times a day (TID) | SUBCUTANEOUS | Status: DC
Start: 2015-03-01 — End: 2015-11-19

## 2015-03-01 NOTE — Telephone Encounter (Signed)
Pt would like the phone number for [facility] gastro dept Said she got it at her last visit with [facility] Please call her with the number

## 2015-03-01 NOTE — Telephone Encounter (Signed)
Received a fax from [facility] needing clarification regarding Jardiance and Lantus.  Will the Jardiance be replacing [place]? Was the Lantus replacing Levemir and was the dosage decreased to 15 Units daily from 80 Units BID?  Pt has need a refill on Benicar.  Please give [facility] a call at [phone]. Fax placed in provider box for review.    Derl Barrow, RN

## 2015-03-01 NOTE — Telephone Encounter (Signed)
Attempted to contact [facility] multiple times without response. Tracy Weiss is replacing [place] and Lantus is replacing Levemir. Will not prescribe requested dose of 80units BID since she last reported taking only 10 units daily in February at Dr. Graylin Shiver visit. Will monitor response of 15units daily and titrate up, as this was her last prescribed dose in December.  Prescription sent for Novolog vial.  Unsure of phone number for [facility] gastro department. Will defer to nursing, who likely gave her the number.  Dr. Gerlean Ren

## 2015-03-03 NOTE — Telephone Encounter (Signed)
LVM for pt to call back to let her know the number I got from the [facility] gastro dept on the internet is [phone]. Katharina Caper, April D, [place]

## 2015-03-04 ENCOUNTER — Telehealth: Payer: Self-pay | Admitting: *Deleted

## 2015-03-04 NOTE — Telephone Encounter (Signed)
Pt returned called and was informed of [facility] gastro contact number. Katharina Caper, Kendon Sedeno D, [place]

## 2015-03-06 NOTE — Progress Notes (Signed)
Subjective:     Patient ID: Tracy Weiss, female   DOB: [birth year], 49 y.o.   MRN: [id number]  HPI Tracy Weiss is a 49yo female presenting for diabetes follow up: - States blood sugars are usually in mid-100s, but occasionally are elevated to 200s. States her blood sugars have been more variable over the last few weeks - States she is currently taking Novolog and Laraglutide as prescribed. Also states she is taking the Lantus, however the amount she is taking is widely variable. Questions why prescription for refill has not been sent to [facility] and discussed that at last few visits she denied taking Lantus.  - Denies numbness in feet, problems with urination - Complains of thinning hair that is falling out, changes in anxiety - Recently stopped taking ADHD medication. States she is having trouble focusing, but blames this on anxiety and not ADHD. - States only medications she is taking is Novolog, Lantus, and Laraglutide. Has stopped all other medications. - Has appointment soon to see Neuropsychiatry.   - Had surgery scheduled for hammer toe, but since her A1C is elevated she states this is going to be canceled.  Review of Systems  Genitourinary: Negative for urgency and frequency.  Psychiatric/Behavioral: Positive for decreased concentration. The patient is nervous/anxious.        Objective:   Physical Exam  Constitutional: She appears well-developed and well-nourished. No distress.  Cardiovascular: Normal rate and regular rhythm.  Exam reveals no gallop and no friction rub.   No murmur heard. Pulmonary/Chest: Effort normal. No respiratory distress. She has no wheezes. She has no rales.  Abdominal: Soft. She exhibits no distension. There is no tenderness.  Musculoskeletal: She exhibits no edema.  Neurological:  Normal diabetic foot exam without sensation deficit  Skin:  Normal diabetic foot exam without ulcers or callus      Assessment:     Please refer to Problem List for  Assessment.    Plan:     Please refer to Problem List for Plan.

## 2015-03-06 NOTE — Assessment & Plan Note (Addendum)
-   A1C elevated today from 6.4 in December to 7.3 today - Continue Novolog and Laraglutide. Will send prescription for Lantus since she states she is taking it now.  - Discussed Jardiance vs. restarting Farxiga. Would like to initiate Jardiance today - Will check urine microalbumin/creatinine ratio - Will also check TSH given hair changes with possible anxiety. Suspect decreased focus is due to stopping ADHD medication and not anxiety. Defer medications to Neuropsychiatry. - Will mail lab results - Follow up in 67months for A1C check

## 2015-03-09 ENCOUNTER — Encounter: Payer: Self-pay | Admitting: Family Medicine

## 2015-03-09 ENCOUNTER — Other Ambulatory Visit: Payer: Self-pay | Admitting: Family Medicine

## 2015-03-09 MED ORDER — LOSARTAN POTASSIUM 50 MG PO TABS: 50.0000 mg | ORAL_TABLET | Freq: Every day | ORAL | Status: DC

## 2015-03-09 NOTE — Progress Notes (Unsigned)
Mildly elevated urine microalbumin noted with normal microalbumin/creatinine ratio. Cough noted with Lisinopril initially. Was switched to Losartan, however she self-discontinued this and cough remained for three weeks. Suspect cough was not due to Losartan.

## 2015-03-14 ENCOUNTER — Ambulatory Visit: Payer: No Typology Code available for payment source | Attending: Psychology | Admitting: Psychology

## 2015-03-14 DIAGNOSIS — F908 Attention-deficit hyperactivity disorder, other type: Secondary | ICD-10-CM

## 2015-03-14 DIAGNOSIS — F411 Generalized anxiety disorder: Secondary | ICD-10-CM | POA: Insufficient documentation

## 2015-03-14 NOTE — Progress Notes (Addendum)
[facility]  [address]   Telephone [phone] [address] Fax [phone] [place], [place] [address]  Initial Contact Note  Name:  [name] Date of Birth; [birth year] MRN:  [id number] Date:  03/14/2015  Tracy Weiss is an 49 y.o. female who was referred for neuropsychological evaluation by Junie Panning, DO due to her report of declining cognitive functioning over the past year.    A total of 6 hours was spent today reviewing medical records, interviewing (CPT [phone]) [name] and administering and scoring neurocognitive tests (CPT [phone] & [phone].  Preliminary Diagnostic Impression: Attention Deficit Disorder, other type (Adult Residual) [F90.8]  There were no concerns expressed or behaviors displayed by [name] that would require immediate attention.   A full report will follow once the planned testing has been completed. Her next appointment is scheduled for 03/18/15.    Jamey Ripa, Ph.D Licensed Psychologist 03/14/2015

## 2015-03-18 ENCOUNTER — Ambulatory Visit (INDEPENDENT_AMBULATORY_CARE_PROVIDER_SITE_OTHER): Payer: No Typology Code available for payment source | Admitting: Psychology

## 2015-03-18 DIAGNOSIS — F411 Generalized anxiety disorder: Secondary | ICD-10-CM

## 2015-03-18 DIAGNOSIS — F908 Attention-deficit hyperactivity disorder, other type: Secondary | ICD-10-CM
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
## 2015-03-22 LAB — HM DIABETES EYE EXAM

## 2015-03-24 ENCOUNTER — Encounter: Payer: Self-pay | Admitting: Psychology

## 2015-03-24 ENCOUNTER — Encounter: Payer: Self-pay | Admitting: Family Medicine

## 2015-03-24 ENCOUNTER — Ambulatory Visit (INDEPENDENT_AMBULATORY_CARE_PROVIDER_SITE_OTHER): Payer: No Typology Code available for payment source | Admitting: Family Medicine

## 2015-03-24 VITALS — BP 133/83 | HR 82 | Temp 98.5°F | Ht 61.0 in | Wt 181.1 lb

## 2015-03-24 DIAGNOSIS — F909 Attention-deficit hyperactivity disorder, unspecified type: Secondary | ICD-10-CM

## 2015-03-24 DIAGNOSIS — F411 Generalized anxiety disorder: Secondary | ICD-10-CM

## 2015-03-24 DIAGNOSIS — F988 Other specified behavioral and emotional disorders with onset usually occurring in childhood and adolescence: Secondary | ICD-10-CM

## 2015-03-24 MED ORDER — METHYLPHENIDATE HCL ER (LA) 30 MG PO CP24: 30.0000 mg | ORAL_CAPSULE | ORAL | Status: DC

## 2015-03-24 MED ORDER — ALPRAZOLAM 0.25 MG PO TABS: 0.2500 mg | ORAL_TABLET | Freq: Two times a day (BID) | ORAL | Status: DC | PRN

## 2015-03-24 NOTE — Patient Instructions (Signed)
Thank you so much for coming to visit me today! I have given you written prescriptions for Xanax and Ritalin. Please contact me with the medication you were taking previously for your kidneys. I will continue to look through your chart for this information.  Thanks again! Dr. Gerlean Ren

## 2015-03-24 NOTE — Progress Notes (Signed)
Subjective:     Patient ID: Tracy Weiss, female   DOB: [birth year], 49 y.o.   MRN: [id number]  HPI Tracy Weiss is a 49yo female presenting today to obtain prescriptions recommended by psychologist.  - States she was sent back to clinic by Dr. Valentina Weiss for medication management based on his recommendations. Recommended restarting ADHD medication and initiation of low dose benzodiazepine. - No other concerns at this time.  Obtained faxed notes from Dr. Valentina Weiss for neuropsychological evaluation from 7/11 and 03/18/15 - Reported significant decline in focus and memory since beginning of 2015 - Diagnosed with ADHD in 2005 byPCP and then again in 2010 at [facility] clinic. - Reports ADHD symptoms, including easily distracted, anxious, fidgety, trouble getting started on tasks, difficulty organizing - Has been on Strattera, Adderall, Focalin, and Methylphenidate - Stopped taking methylphenidate in June 2016. Reports medication made her more emotional. Feels mentally sharper and has better mood since stopping. - Reports getting four hours of sleep per night for the last five years - Feels anxious--restless, on edge, unable to relax, flushing, worry - Avoids groups of people due to being flustered by noise - Lives alone. Was married for 7 years before divorcing at 49yo. Has twin daughters 11yo.  - Evaluation:   Adult ADHD Self-Report Scale System Checklist= high likelihood of ADHD  Wender-[place] Rating Scale= 38/100  Beck Depression Inventory-II = 13 (normal, minimal)   Beck Anxiety Inventory = 22 (moderate)  Personality Assessment Screener= 18  Conners Continuous Performance Test= normal  Trails A= low average  Trails B= high average  Stroop Color and Word Test= [place]  Digit Span Backward and Sequencing= Average  Wechsler Memory Scale normal  Immediate Memory Index normal   Delayed Memory Index average  Controlled Oral Word Associationg Test= High Average  Wisconsin Card Sorting Test=  impulsive response style - Diagnoses/Recommendations  Adult ADHD  Resume Methylphenidate XR  Anxiety  Reduce responsibilities to allow more time to relax  Low dose benzodiazepine (Xanax vs. Buspar)  Therapy  Overload of Cognitive Demands (multiple jobs, enrolled in [employer])  Encourage 7-8hr of sleep per night Review of Systems  Psychiatric/Behavioral: Positive for agitation. Negative for suicidal ideas. The patient is nervous/anxious.        Objective:   Physical Exam  Constitutional: She is oriented to person, place, and time. She appears well-developed and well-nourished. No distress.  Cardiovascular: Normal rate and regular rhythm.  Exam reveals no gallop and no friction rub.   No murmur heard. Pulmonary/Chest: Effort normal. No respiratory distress. She has no wheezes. She has no rales.  Abdominal: Soft. Bowel sounds are normal. She exhibits no distension. There is no tenderness. There is no rebound.  Neurological: She is alert and oriented to person, place, and time.  Psychiatric: She has a normal mood and affect. Her behavior is normal.       Assessment:     Please refer to Problem List for Assessment.    Plan:     Please refer to Problem List for Plan.

## 2015-03-28 ENCOUNTER — Ambulatory Visit (HOSPITAL_COMMUNITY)
Admission: RE | Admit: 2015-03-28 | Discharge: 2015-03-28 | Disposition: A | Discharge status: Home / Self Care | Payer: No Typology Code available for payment source | Source: Ambulatory Visit | Attending: Family Medicine | Admitting: Family Medicine

## 2015-03-28 DIAGNOSIS — Z1231 Encounter for screening mammogram for malignant neoplasm of breast: Secondary | ICD-10-CM | POA: Insufficient documentation

## 2015-03-29 ENCOUNTER — Telehealth: Payer: Self-pay | Admitting: Family Medicine

## 2015-03-29 NOTE — Telephone Encounter (Signed)
Pt called because she wants to still take her Benicar HCT. She doesn't understand why the doctor changed her medication. If there is a particular reason that is fine but she wasn't having any issues with taking this. Please call patient to discuss.Tracy Weiss

## 2015-03-30 ENCOUNTER — Telehealth: Payer: Self-pay | Admitting: *Deleted

## 2015-03-30 DIAGNOSIS — F411 Generalized anxiety disorder: Secondary | ICD-10-CM | POA: Insufficient documentation

## 2015-03-30 HISTORY — DX: Generalized anxiety disorder: F41.1

## 2015-03-30 NOTE — Assessment & Plan Note (Signed)
-   Restart Ritalin LA. Prescription given x3 months.

## 2015-03-30 NOTE — Telephone Encounter (Signed)
Attempted to contact without response.  Benicar-HCT was not prescribed because it is two anti-hypertensives mixed into one. We are trying to prevent kidney injury from diabetes and not treat hypertension. The Benicar component and not the HCTZ medication is what was used previously to prevent kidney damage, so I can prescribe this medication without the HCTZ component she had before if she would like. Benicar (Olmesartan) is in the same family as Losartan and both are equally effective, so she can decide which of these she would prefer. Please let her know that this will be Benicar and not Benicar HCT (which has the HCTZ component), so if she decides on this medication it will have a different name on the bottle and may look different that what she took previously.

## 2015-03-30 NOTE — Assessment & Plan Note (Signed)
-   Encouraged sleep - Obtained notes from office visit with Dr. Valentina Shaggy. Summary in note above. Scanned to record. - Small dose of Xanax 0.25mg  BID PRN given, #20. If requiring medication daily, will switch to Buspar. - Recommend therapy

## 2015-03-30 NOTE — Telephone Encounter (Signed)
"  I have an upcoming surgery.  I need to cancel appointment due to my sugar being elevated.  Give me a call to let me know you received this information."  I called and left a message that we will cancel that appointment.  Call if you would like to reschedule on tomorrow.

## 2015-03-31 NOTE — Telephone Encounter (Signed)
I called and canceled patient's surgery.  "Is there any particular reason?"  She said her glucose level is elevated.  "Okay, I'll put abnormal labs."

## 2015-04-13 ENCOUNTER — Encounter (HOSPITAL_BASED_OUTPATIENT_CLINIC_OR_DEPARTMENT_OTHER): Admission: RE | Payer: Self-pay | Source: Ambulatory Visit

## 2015-04-13 ENCOUNTER — Ambulatory Visit (HOSPITAL_BASED_OUTPATIENT_CLINIC_OR_DEPARTMENT_OTHER): Admission: RE | Admit: 2015-04-13 | Payer: Self-pay | Source: Ambulatory Visit | Admitting: Podiatry

## 2015-04-13 SURGERY — CORRECTION, HAMMER TOE
Anesthesia: Monitor Anesthesia Care | Laterality: Bilateral

## 2015-04-18 ENCOUNTER — Encounter: Payer: No Typology Code available for payment source | Admitting: Podiatry

## 2015-04-25 ENCOUNTER — Ambulatory Visit (INDEPENDENT_AMBULATORY_CARE_PROVIDER_SITE_OTHER): Payer: Self-pay | Admitting: Family Medicine

## 2015-04-25 ENCOUNTER — Encounter: Payer: Self-pay | Admitting: Family Medicine

## 2015-04-25 VITALS — BP 117/67 | HR 84 | Temp 98.4°F | Ht 61.0 in | Wt 180.9 lb

## 2015-04-25 DIAGNOSIS — E119 Type 2 diabetes mellitus without complications: Secondary | ICD-10-CM

## 2015-04-25 DIAGNOSIS — I10 Essential (primary) hypertension: Secondary | ICD-10-CM

## 2015-04-25 NOTE — Progress Notes (Signed)
Subjective:     Patient ID: Tracy Weiss, female   DOB: March 08, 1966, 49 y.o.   MRN: [id number]  HPI Tracy Weiss is a 49yo female presenting today to discuss medications. - Currently on Losartan 68m for nephroprotection - Was prescribed Olmesartan-HCTZ 436m25mg in past for kidney protection + HTN control. Discontinued since Tracy Weiss stopped taking medication on her own. Blood pressure has been well controlled without medication. - Upset since she was started on Losartan instead of Olmesartan-HCTZ. States she would like to be started on this medication even if her blood pressure is well controlled to prevent developing high blood pressure in the future - Would like to meet with clinic pharmacist - Would also like to have referral to general surgery to discuss bariatric surgery. Has met with them in the past and was going to have surgery but "chickened out." Was initially referred by Dr. PiThomes Dinningnd was told this would cure her diabetes and other problems. Has had friends who had surgery and did well with it. [facility], but [place] Review of Systems  Eyes: Negative for visual disturbance.  Neurological: Negative for numbness.       Objective:   Physical Exam  Constitutional: She appears well-developed and well-nourished. No distress.  HENT:  Head: Normocephalic and atraumatic.  Cardiovascular: Normal rate and regular rhythm.  Exam reveals no gallop and no friction rub.   No murmur heard. Pulmonary/Chest: Effort normal. No respiratory distress. She has no wheezes. She has no rales.  Abdominal: Soft. Bowel sounds are normal. She exhibits no distension. There is no tenderness.  Musculoskeletal: Normal range of motion. She exhibits no edema.  Neurological:  No decreased sensation noted on diabetic foot exam  Skin:  No foot ulcers noted       Assessment:     Please refer to Problem List for  Assessment.    Plan:     Please refer to Problem List for Plan.

## 2015-04-25 NOTE — Patient Instructions (Addendum)
Thank you so much for coming to visit me today! Please continue to take Losartan. If your blood pressure remains high, then we can consider increasing your dose of Losartan. If it is still high, then we will consider a combination pill like the one you used to be on. Your goal blood pressure is less than 130/90. Please follow up in one month so we can recheck your blood pressure and your A1C. I have placed a referral to General Surgery--they have a subset of doctors (including Dr. Bonnita Levan) who do all of the bariatric surgeries at [facility]. Please schedule an appointment with [facility] to discuss your medications.  Thanks again! Dr. Gerlean Ren

## 2015-04-26 NOTE — Assessment & Plan Note (Addendum)
-   Well controlled - Discussed that her blood pressure is well controlled (117/64 today) and below goal of 130/90. Would not recommend restarting Olmesartan-HCTZ to prevent possible elevated blood pressures in the future at this time given her well controlled blood pressure at recent office visits. Discussed that all medications come with risks and that she should only be on medicines that are needed. Risks of starting unnecessary anti-hypertensives include hypotension and could result in her feeling fatigued and dizzy. Given option to continue to Losartan or switch to Olmesartan without HCTZ component. Would like to continue on Losartan at this time. Discussed if we do have problems with her blood pressure in the future, we would go in a step wise manner, first increasing her Losartan and then possibly adding HCTZ if her blood pressure is still not controlled. Would not start HCTZ until Losartan is maxed out.

## 2015-04-26 NOTE — Assessment & Plan Note (Addendum)
-   Diabetic foot exam with no ulcers, pulses intact, no loss of sensation - Had long discussion on effects of diabetes on her kidneys and why she is prescribed Losartan. Given option to continue to Losartan or switch to Olmesartan without HCTZ component. Would like to continue on Losartan at this time. - Would like to meet with pharmacist, Valentina Lucks. Schedule appointment. - Discussed that bariatric surgery is most likely not an option for her given her lack of attempting diet and exercise first. Encouraged discussing at GI appointment at [facility]. Encouraged working on diet and exercise. - Follow up in one month for A1C check

## 2015-04-29 NOTE — Telephone Encounter (Signed)
Addressed at office visit on 8/22.

## 2015-05-16 ENCOUNTER — Emergency Department (HOSPITAL_BASED_OUTPATIENT_CLINIC_OR_DEPARTMENT_OTHER)
Admission: EM | Admit: 2015-05-16 | Discharge: 2015-05-16 | Disposition: A | Discharge status: Home / Self Care | Payer: Self-pay | Attending: Emergency Medicine | Admitting: Emergency Medicine

## 2015-05-16 ENCOUNTER — Emergency Department (HOSPITAL_COMMUNITY): Admission: EM | Admit: 2015-05-16 | Discharge: 2015-05-16 | Discharge status: Absent without leave | Payer: Self-pay

## 2015-05-16 ENCOUNTER — Encounter (HOSPITAL_BASED_OUTPATIENT_CLINIC_OR_DEPARTMENT_OTHER): Payer: Self-pay | Admitting: *Deleted

## 2015-05-16 ENCOUNTER — Emergency Department (HOSPITAL_BASED_OUTPATIENT_CLINIC_OR_DEPARTMENT_OTHER): Payer: Self-pay

## 2015-05-16 DIAGNOSIS — Z79899 Other long term (current) drug therapy: Secondary | ICD-10-CM | POA: Insufficient documentation

## 2015-05-16 DIAGNOSIS — J069 Acute upper respiratory infection, unspecified: Secondary | ICD-10-CM | POA: Insufficient documentation

## 2015-05-16 DIAGNOSIS — H748X1 Other specified disorders of right middle ear and mastoid: Secondary | ICD-10-CM | POA: Insufficient documentation

## 2015-05-16 DIAGNOSIS — Z87891 Personal history of nicotine dependence: Secondary | ICD-10-CM | POA: Insufficient documentation

## 2015-05-16 DIAGNOSIS — E119 Type 2 diabetes mellitus without complications: Secondary | ICD-10-CM | POA: Insufficient documentation

## 2015-05-16 DIAGNOSIS — M791 Myalgia: Secondary | ICD-10-CM | POA: Insufficient documentation

## 2015-05-16 DIAGNOSIS — Z794 Long term (current) use of insulin: Secondary | ICD-10-CM | POA: Insufficient documentation

## 2015-05-16 DIAGNOSIS — J0110 Acute frontal sinusitis, unspecified: Secondary | ICD-10-CM | POA: Insufficient documentation

## 2015-05-16 DIAGNOSIS — H748X2 Other specified disorders of left middle ear and mastoid: Secondary | ICD-10-CM | POA: Insufficient documentation

## 2015-05-16 LAB — CBC WITH DIFFERENTIAL/PLATELET
Basophils Absolute: 0 10*3/uL (ref 0.0–0.1)
Basophils Relative: 1 % (ref 0–1)
Eosinophils Absolute: 0.1 10*3/uL (ref 0.0–0.7)
Eosinophils Relative: 1 % (ref 0–5)
HCT: 37.1 % (ref 36.0–46.0)
Hemoglobin: 12.2 g/dL (ref 12.0–15.0)
Lymphocytes Relative: 39 % (ref 12–46)
Lymphs Abs: 3 10*3/uL (ref 0.7–4.0)
MCH: 28.1 pg (ref 26.0–34.0)
MCHC: 32.9 g/dL (ref 30.0–36.0)
MCV: 85.5 fL (ref 78.0–100.0)
Monocytes Absolute: 0.4 10*3/uL (ref 0.1–1.0)
Monocytes Relative: 5 % (ref 3–12)
Neutro Abs: 4.3 10*3/uL (ref 1.7–7.7)
Neutrophils Relative %: 54 % (ref 43–77)
Platelets: 342 10*3/uL (ref 150–400)
RBC: 4.34 MIL/uL (ref 3.87–5.11)
RDW: 12.7 % (ref 11.5–15.5)
WBC: 7.8 10*3/uL (ref 4.0–10.5)

## 2015-05-16 LAB — BASIC METABOLIC PANEL
Anion gap: 5 (ref 5–15)
BUN: 7 mg/dL (ref 6–20)
CO2: 26 mmol/L (ref 22–32)
Calcium: 9.5 mg/dL (ref 8.9–10.3)
Chloride: 107 mmol/L (ref 101–111)
Creatinine, Ser: 0.69 mg/dL (ref 0.44–1.00)
GFR calc Af Amer: >60 mL/min (ref >60)
GFR calc non Af Amer: >60 mL/min (ref >60)
Glucose, Bld: 112 mg/dL — ABNORMAL HIGH (ref 65–99)
Potassium: 3.7 mmol/L (ref 3.5–5.1)
Sodium: 138 mmol/L (ref 135–145)

## 2015-05-16 LAB — TROPONIN I: Troponin I: <0.03 ng/mL (ref <0.031)

## 2015-05-16 MED ORDER — CETIRIZINE HCL 10 MG PO CAPS: 10.0000 mg | ORAL_CAPSULE | Freq: Every morning | ORAL | Status: DC

## 2015-05-16 MED ORDER — FLUTICASONE PROPIONATE 50 MCG/ACT NA SUSP: 2.0000 | Freq: Every day | NASAL | Status: DC

## 2015-05-16 NOTE — ED Notes (Signed)
Pending lab and imaging results, alert, NAD, calm, interactive, resps e/u, speaking in clear complete sentences, given crackers and soda, sister at [facility], pt reports "feel better since aleve PTA", c/o nasal congestion and can't get a deep breath, (denies at this time: pain, nausea, cough, dizziness, HA, ear ache or other sx). Last emesis Saturday.

## 2015-05-16 NOTE — ED Notes (Signed)
Facial swelling yesterday. Ear pain last week. Woke with hoarseness. Denies sore throat. States she had pressure in her chest on Sunday. Aching in her back.

## 2015-05-16 NOTE — ED Notes (Signed)
Patient states she was checked in at [facility] earlier today and left to come here due to wait and preference.

## 2015-05-16 NOTE — ED Notes (Signed)
Pt alert, NAD, calm, interactive, resps e/u, speaking in clear complete sentences, steady gait to xray, no dyspnea noted.

## 2015-05-16 NOTE — ED Provider Notes (Signed)
CSN: [id number]     Arrival date & time 05/16/15  1831 History   First MD Initiated Contact with Patient 05/16/15 1842     Chief Complaint  Patient presents with  . URI     (Consider location/radiation/quality/duration/timing/severity/associated sxs/prior Treatment) Patient is a 49 y.o. female presenting with URI. The history is provided by the patient.  URI Presenting symptoms: congestion, cough, ear pain, facial pain and fatigue   Presenting symptoms: no fever, no rhinorrhea and no sore throat   Cough:    Cough characteristics:  Non-productive and dry   Severity:  Mild   Timing:  Intermittent Severity:  Moderate Onset quality:  Gradual Duration:  4 days Timing:  Constant Progression:  Worsening Chronicity:  New Relieved by:  None tried Worsened by:  Nothing tried Ineffective treatments:  None tried Associated symptoms: myalgias and sinus pain   Associated symptoms: no neck pain and no wheezing   Associated symptoms comment:  Pt states yesterday when she woke up she had intense chest pressure that radiated into the back and only improved when lifting her arms over her head.  She denies having this uncomfortable sensation currently but has felt ache all day Risk factors: diabetes mellitus   Risk factors: no immunosuppression, no recent illness, no recent travel and no sick contacts     Past Medical History  Diagnosis Date  . Diabetes mellitus without complication    Past Surgical History  Procedure Laterality Date  . Cesarean section     Family History  Problem Relation Age of Onset  . Cancer Mother     Metastatic with Unknown Primary; Stomach was involved  . Diabetes Mother   . Diabetes Father   . Heart disease Father   . Diabetes Maternal Grandmother   . Diabetes Maternal Grandfather   . Diabetes Paternal Grandmother   . Diabetes Paternal Grandfather    Social History  Substance Use Topics  . Smoking status: Former Smoker -- 0.50 packs/day for 10 years   Types: Cigarettes    Start date: 09/03/1982    Quit date: 09/04/1983  . Smokeless tobacco: Never Used  . Alcohol Use: No   OB History    No data available     Review of Systems  Constitutional: Positive for fatigue. Negative for fever.  HENT: Positive for congestion and ear pain. Negative for rhinorrhea and sore throat.   Respiratory: Positive for cough. Negative for wheezing.   Musculoskeletal: Positive for myalgias. Negative for neck pain.  All other systems reviewed and are negative.     Allergies  Review of patient's allergies indicates no known allergies.  Home Medications   Prior to Admission medications   Medication Sig Start Date End Date Taking? Authorizing Provider  ALPRAZolam (XANAX) 0.25 MG tablet Take 1 tablet (0.25 mg total) by mouth 2 (two) times daily as needed for anxiety. 03/24/15   [place] N Rumley, DO  empagliflozin (JARDIANCE) 10 MG TABS tablet Take 10 mg by mouth daily. 02/28/15   [place] N Rumley, DO  insulin aspart (NOVOLOG) 100 UNIT/ML injection Inject 10-15 Units into the skin 3 (three) times daily before meals. 03/01/15   [place] N Rumley, DO  insulin glargine (LANTUS) 100 UNIT/ML injection Inject 0.15 mLs (15 Units total) into the skin daily. 02/28/15   [place] N Rumley, DO  Liraglutide 18 MG/3ML SOPN Inject 1.8 mg into the skin daily. 09/09/14   [place] N Rumley, DO  losartan (COZAAR) 50 MG tablet Take 1 tablet (50 mg total) by  mouth daily. 03/09/15   [place] N Rumley, DO  methylphenidate (RITALIN LA) 30 MG 24 hr capsule Take 1 capsule (30 mg total) by mouth every morning. 03/24/15   Lorna Few, DO  methylphenidate (RITALIN LA) 30 MG 24 hr capsule Take 1 capsule (30 mg total) by mouth every morning. 03/24/15   Lorna Few, DO  methylphenidate (RITALIN LA) 30 MG 24 hr capsule Take 1 capsule (30 mg total) by mouth every morning. 03/24/15   [place] N Rumley, DO   BP 112/72 mmHg  Pulse 76  Temp(Src) 98.3 F (36.8 C) (Oral)  Resp 20  Ht 5\' 2"  (1.575  m)  Wt 180 lb (81.647 kg)  BMI 32.91 kg/m2  SpO2 99%  LMP 04/18/2015 Physical Exam  Constitutional: She is oriented to person, place, and time. She appears well-developed and well-nourished. No distress.  HENT:  Head: Normocephalic and atraumatic.  Right Ear: A middle ear effusion is present.  Left Ear: A middle ear effusion is present.  Nose: Mucosal edema present. Right sinus exhibits maxillary sinus tenderness and frontal sinus tenderness. Left sinus exhibits maxillary sinus tenderness and frontal sinus tenderness.  Mouth/Throat: Oropharynx is clear and moist.  Bilateral nasal turbinates are swollen with ulcers present  Eyes: Conjunctivae and EOM are normal. Pupils are equal, round, and reactive to light.  Neck: Normal range of motion. Neck supple.  Cardiovascular: Normal rate, regular rhythm and intact distal pulses.   No murmur heard. Pulmonary/Chest: Effort normal and breath sounds normal. No respiratory distress. She has no wheezes. She has no rales.  Abdominal: Soft. She exhibits no distension. There is no tenderness. There is no rebound and no guarding.  Musculoskeletal: Normal range of motion. She exhibits no edema or tenderness.  Lymphadenopathy:    She has cervical adenopathy.  Neurological: She is alert and oriented to person, place, and time.  Skin: Skin is warm and dry. No rash noted. No erythema.  Psychiatric: She has a normal mood and affect. Her behavior is normal.  Nursing note and vitals reviewed.   ED Course  Procedures (including critical care time) Labs Review Labs Reviewed  BASIC METABOLIC PANEL - Abnormal; Notable for the following:    Glucose, Bld 112 (*)    All other components within normal limits  CBC WITH DIFFERENTIAL/PLATELET  TROPONIN I    Imaging Review Dg Chest 2 View  05/16/2015   CLINICAL DATA:  Chest pain and pressure. Some hoarseness. No other complaints. History of diabetes.  EXAM: CHEST  2 VIEW  COMPARISON:  12/30/2014  FINDINGS: The  heart size and mediastinal contours are within normal limits. Both lungs are clear. No pleural effusion or pneumothorax. The visualized skeletal structures are unremarkable.  IMPRESSION: No active cardiopulmonary disease.   Electronically Signed   By: Lajean Manes M.D.   On: 05/16/2015 19:48   I have personally reviewed and evaluated these images and lab results as part of my medical decision-making.   EKG Interpretation   Date/Time:  Monday May 16 2015 18:55:20 EDT Ventricular Rate:  68 PR Interval:  174 QRS Duration: 78 QT Interval:  406 QTC Calculation: 431 R Axis:   25 Text Interpretation:  Normal sinus rhythm Normal ECG No significant change  since last tracing Confirmed by Maryan Rued  MD, Loree Fee ([id number]) on 05/16/2015  6:55:16 PM      MDM   Final diagnoses:  Acute frontal sinusitis, recurrence not specified  URI (upper respiratory infection)    Patient presents today complaining of vague  symptoms of facial pressure, photophobia, ear pain and chest pressure that has been ongoing for the last 4 days. She is also complaining of mild chills but denies any fever. Dry cough several days ago but no cough now. She denies any change in her blood sugars and her medications have been stable for the last 1 month. She has no sore throat but does feel this shortness of breath and chest pain she describes as a pressure that does radiate into her back and improves with lifting her arms and stretching. No prior cardiac history she does have diabetes but no longer uses tobacco. On exam patient has significant swollen nasal turbinates with ulcers present. She has facial tenderness on palpation and fluid behind her years. Lung sounds are clear and no reproducible chest pain at this point. Patient denies having any chest pain currently. No other acute findings on exam. Patient symptoms sound most likely related to viral illness versus allergies. Low suspicion for cardiac etiology. EKG is within  normal limits. Chest x-ray, troponin, CBC and BMP pending. However if all of these are within normal limits will treat patient with a nasal steroid and decongested.  8:40 PM All labs and imaging within normal limits. Patient discharged home.  Blanchie Dessert, MD 05/16/15 2040

## 2015-05-25 ENCOUNTER — Other Ambulatory Visit: Payer: Self-pay | Admitting: *Deleted

## 2015-05-25 DIAGNOSIS — E785 Hyperlipidemia, unspecified: Secondary | ICD-10-CM

## 2015-05-25 MED ORDER — ROSUVASTATIN CALCIUM 10 MG PO TABS: 10.0000 mg | ORAL_TABLET | Freq: Every day | ORAL | Status: DC

## 2015-06-07 ENCOUNTER — Other Ambulatory Visit: Payer: Self-pay | Admitting: *Deleted

## 2015-06-07 MED ORDER — LIRAGLUTIDE 18 MG/3ML ~~LOC~~ SOPN: 1.8000 mg | PEN_INJECTOR | Freq: Every day | SUBCUTANEOUS | Status: DC

## 2015-06-10 ENCOUNTER — Telehealth: Payer: Self-pay | Admitting: Family Medicine

## 2015-06-10 NOTE — Telephone Encounter (Signed)
Will need to be seen and evaluated before any referrals are considered.

## 2015-06-10 NOTE — Telephone Encounter (Signed)
Pt called because she has protein in her urine since it is having a lot of bubbles. She wants the doctor to know ahead of time so that she can get the referrals that she will needs a head of time jw

## 2015-06-13 NOTE — Telephone Encounter (Signed)
Patient has an appointment scheduled for Tuesday 06/14/15. Ottis Stain, CMA

## 2015-06-14 ENCOUNTER — Encounter: Payer: Self-pay | Admitting: Family Medicine

## 2015-06-29 ENCOUNTER — Telehealth: Payer: Self-pay

## 2015-06-29 ENCOUNTER — Ambulatory Visit (INDEPENDENT_AMBULATORY_CARE_PROVIDER_SITE_OTHER): Payer: Self-pay | Admitting: Family Medicine

## 2015-06-29 VITALS — BP 110/70 | HR 72 | Temp 98.1°F | Ht 62.0 in | Wt 181.7 lb

## 2015-06-29 DIAGNOSIS — F988 Other specified behavioral and emotional disorders with onset usually occurring in childhood and adolescence: Secondary | ICD-10-CM

## 2015-06-29 DIAGNOSIS — F909 Attention-deficit hyperactivity disorder, unspecified type: Secondary | ICD-10-CM

## 2015-06-29 DIAGNOSIS — Z23 Encounter for immunization: Secondary | ICD-10-CM

## 2015-06-29 DIAGNOSIS — I10 Essential (primary) hypertension: Secondary | ICD-10-CM

## 2015-06-29 DIAGNOSIS — E119 Type 2 diabetes mellitus without complications: Secondary | ICD-10-CM

## 2015-06-29 LAB — POCT URINALYSIS DIPSTICK
Bilirubin, UA: NEGATIVE
Blood, UA: NEGATIVE
Glucose, UA: NEGATIVE
Ketones, UA: NEGATIVE
Leukocytes, UA: NEGATIVE
Nitrite, UA: NEGATIVE
Protein, UA: NEGATIVE
Spec Grav, UA: >=1.03
Urobilinogen, UA: 0.2
pH, UA: 6

## 2015-06-29 LAB — POCT GLYCOSYLATED HEMOGLOBIN (HGB A1C): Hemoglobin A1C: 7.4

## 2015-06-29 MED ORDER — METHYLPHENIDATE HCL ER (LA) 30 MG PO CP24: 30.0000 mg | ORAL_CAPSULE | ORAL | Status: DC

## 2015-06-29 NOTE — Patient Instructions (Signed)
Thank you so much for coming to visit me today! Your A1C is stable at 7.4 today. Please continue Lantus and Novolog and start back on the Victoza. Please continue to work on your diet and exercise. We will check the protein in your urine today and I will let you know the results. We may need to recheck this at your next visit depending on the results. Please follow up in three months.  Thanks again! Dr. Gerlean Ren

## 2015-06-29 NOTE — Telephone Encounter (Signed)
Called patient to inform her that she was not eligible for bariatric surgery without insurance unless she paid for it and that there was not a medical necessity for the surgery. She informed me that she was a perfect candidate because of her diabetes , hypertension and elevated cholesterol. She said that she had been sent to a 3 hour class in the past that prepared her for it and did not understand why it was no longer approved. She said that she was pre-qualified per Dr. Thomes Dinning in the past and all her existing criteria were still present.Tracy Weiss

## 2015-06-30 ENCOUNTER — Encounter: Payer: Self-pay | Admitting: Family Medicine

## 2015-06-30 LAB — PROTEIN / CREATININE RATIO, URINE
Creatinine, Urine: 388 mg/dL — ABNORMAL HIGH (ref 20–320)
Protein Creatinine Ratio: 46 mg/g creat (ref 21–161)
Total Protein, Urine: 18 mg/dL (ref 5–24)

## 2015-07-02 ENCOUNTER — Other Ambulatory Visit: Payer: Self-pay | Admitting: Family Medicine

## 2015-07-02 MED ORDER — OLMESARTAN MEDOXOMIL-HCTZ 40-25 MG PO TABS: 1.0000 | ORAL_TABLET | Freq: Every day | ORAL | Status: DC

## 2015-07-02 NOTE — Assessment & Plan Note (Signed)
-   A1C 7.4 today - Noncompliant with medications. Continue Novolog 10units TID with meals and Lantus 15units. Will restart Liraglutide as prescribed. Refuses Jardiance at this time. - Urinalysis negative for protein. Will obtain protein/creatinine ratio today - Follow up in three months for A1C check

## 2015-07-02 NOTE — Assessment & Plan Note (Signed)
Refill given

## 2015-07-02 NOTE — Assessment & Plan Note (Addendum)
-   Initiated BenicarHCTZ despite discussion at last visit that this medication was not indicated at that time since she was normotensive. States Pharmacy has been sending her that medication instead of ACE inhibitor. BP stable with two BP medications. Will continue at this time given stable BP.

## 2015-07-02 NOTE — Progress Notes (Signed)
Subjective:     Patient ID: Tracy Weiss, female   DOB: 1966-03-29, 49 y.o.   MRN: [id number]  HPI Tracy Weiss is a 49yo female presenting today for diabetes visit.  # Diabetes: - Currently prescribed Jardiance 10mg , Novolog 10units TID with meals, Lantus 15units, and Liraglutide 1.8mg . States she has not been taking Jardiance or Liraglutide and discontinued these on her own since her last visit because she felt that these medications were causing her kidney problems. - Concerned because she has noticed bubbles in her urine. Concerned that these might be due to protein. - Has been using BenicarHCTZ 40/25 even though she is prescribed Benicar. States her pharmacy has been sending her this instead. - Follows with ophthalmology q46months - States blood sugar normally runs between 100s-150s. Highest was 238. - Last A1C 7.3 three months ago - Plans to follow up with pharmacy  #ADD: - Stable - Requests refill of medication  Smoking status discussed. Does not smoke.  Review of Systems Per HPI    Objective:   Physical Exam  Constitutional: She appears well-developed and well-nourished. No distress.  HENT:  Head: Normocephalic and atraumatic.  Cardiovascular: Normal rate and regular rhythm.  Exam reveals no gallop and no friction rub.   No murmur heard. Pulmonary/Chest: Effort normal. No respiratory distress. She has no wheezes. She has no rales.  Abdominal: Soft. She exhibits no distension. There is no tenderness.  Musculoskeletal: She exhibits no edema.  Neurological: She is alert.  Sensation intact on feet bilaterally  Skin:  No ulcerations or calluses noted on feet bilaterally  Psychiatric: She has a normal mood and affect. Her behavior is normal.      Assessment and Plan:     Essential hypertension, benign - Initiated BenicarHCTZ despite discussion at last visit that this medication was not indicated at that time since she was normotensive. States Pharmacy has been sending her that  medication instead of ACE inhibitor. BP stable with two BP medications. Will continue at this time given stable BP.  Diabetes mellitus without complication - V4B 7.4 today - Noncompliant with medications. Continue Novolog 10units TID with meals and Lantus 15units. Will restart Liraglutide as prescribed. Refuses Jardiance at this time. - Urinalysis negative for protein. Will obtain protein/creatinine ratio today - Follow up in three months for A1C check  Attention deficit disorder - Refill given   NOTE that after visit was completed she asked the nurse for numerous requests that were not mentioned during the visit even though Tracy Weiss states she discussed them with me. First stated I had said she was supposed to get a B12 shot today. Last B12 level was normal and B12 shot is not indicated at this time. Also requested referral to Bariatric Surgery; Tracy Weiss has had discussions with multiple people including myself over the last several months that this referral is not indicated and is inappropriate at this time.

## 2015-07-11 ENCOUNTER — Other Ambulatory Visit: Payer: Self-pay | Admitting: *Deleted

## 2015-07-11 MED ORDER — LIRAGLUTIDE 18 MG/3ML ~~LOC~~ SOPN: 1.8000 mg | PEN_INJECTOR | Freq: Every day | SUBCUTANEOUS | Status: DC

## 2015-08-22 ENCOUNTER — Ambulatory Visit: Payer: Self-pay

## 2015-09-04 HISTORY — PX: OTHER SURGICAL HISTORY: SHX169

## 2015-11-01 ENCOUNTER — Encounter (HOSPITAL_BASED_OUTPATIENT_CLINIC_OR_DEPARTMENT_OTHER): Payer: Self-pay | Admitting: *Deleted

## 2015-11-01 ENCOUNTER — Emergency Department (HOSPITAL_BASED_OUTPATIENT_CLINIC_OR_DEPARTMENT_OTHER)
Admission: EM | Admit: 2015-11-01 | Discharge: 2015-11-01 | Disposition: A | Discharge status: Home / Self Care | Payer: Self-pay | Attending: Physician Assistant | Admitting: Physician Assistant

## 2015-11-01 DIAGNOSIS — E119 Type 2 diabetes mellitus without complications: Secondary | ICD-10-CM | POA: Insufficient documentation

## 2015-11-01 DIAGNOSIS — Z7951 Long term (current) use of inhaled steroids: Secondary | ICD-10-CM | POA: Insufficient documentation

## 2015-11-01 DIAGNOSIS — G8929 Other chronic pain: Secondary | ICD-10-CM | POA: Insufficient documentation

## 2015-11-01 DIAGNOSIS — Z79899 Other long term (current) drug therapy: Secondary | ICD-10-CM | POA: Insufficient documentation

## 2015-11-01 DIAGNOSIS — M25511 Pain in right shoulder: Secondary | ICD-10-CM | POA: Insufficient documentation

## 2015-11-01 DIAGNOSIS — Z87891 Personal history of nicotine dependence: Secondary | ICD-10-CM | POA: Insufficient documentation

## 2015-11-01 DIAGNOSIS — Z794 Long term (current) use of insulin: Secondary | ICD-10-CM | POA: Insufficient documentation

## 2015-11-01 MED ORDER — OXYCODONE-ACETAMINOPHEN 5-325 MG PO TABS
1.0000 | ORAL_TABLET | Freq: Four times a day (QID) | ORAL | Status: DC | PRN
Start: 2015-11-01 — End: 2016-02-14

## 2015-11-01 MED ORDER — DEXAMETHASONE SODIUM PHOSPHATE 10 MG/ML IJ SOLN
10.0000 mg | Freq: Once | INTRAMUSCULAR | Status: AC
  Administered 2015-11-01: 10 mg via INTRAMUSCULAR
  Filled 2015-11-01: qty 1

## 2015-11-01 MED ORDER — IBUPROFEN 800 MG PO TABS
800.0000 mg | ORAL_TABLET | Freq: Once | ORAL | Status: AC
  Administered 2015-11-01: 800 mg via ORAL
  Filled 2015-11-01: qty 1

## 2015-11-01 MED FILL — OXYCODONE/APAP 5-325: 5-325 | 1 days supply | Qty: 5 | Fill #0

## 2015-11-01 NOTE — ED Provider Notes (Signed)
CSN: [id number]     Arrival date & time 11/01/15  1141 History   First MD Initiated Contact with Patient 11/01/15 1154     Chief Complaint  Patient presents with  . Arm Injury     (Consider location/radiation/quality/duration/timing/severity/associated sxs/prior Treatment) HPI   Patient is a 50 year old female presenting with right shoulder pain. Patient asking for something immediately to help her. This pain has been going on for several months to years. She's had physical therapy in the past for it. She reports that she has been diagnosed with difficulty with her rotator cuff.  Patient has a primary care physician but has not contacted them today.  Patient reports that her shoulder pain got worse in the last couple weeks and has been on and off worse and is worse this morning. Patient reports full range of motion and strength.  No new trauma. No fevers, no rash.     Past Medical History  Diagnosis Date  . Diabetes mellitus without complication [facility])    Past Surgical History  Procedure Laterality Date  . Cesarean section     Family History  Problem Relation Age of Onset  . Cancer Mother     Metastatic with Unknown Primary; Stomach was involved  . Diabetes Mother   . Diabetes Father   . Heart disease Father   . Diabetes Maternal Grandmother   . Diabetes Maternal Grandfather   . Diabetes Paternal Grandmother   . Diabetes Paternal Grandfather    Social History  Substance Use Topics  . Smoking status: Former Smoker -- 0.50 packs/day for 10 years    Types: Cigarettes    Start date: 09/03/1982    Quit date: 09/04/1983  . Smokeless tobacco: Never Used  . Alcohol Use: No   OB History    No data available     Review of Systems  Constitutional: Negative for activity change.  Respiratory: Negative for shortness of breath.   Cardiovascular: Negative for chest pain.  Gastrointestinal: Negative for abdominal pain.  Musculoskeletal: Positive for arthralgias.       Allergies  Review of patient's allergies indicates no known allergies.  Home Medications   Prior to Admission medications   Medication Sig Start Date End Date Taking? Authorizing Provider  Cetirizine HCl (ZYRTEC ALLERGY) 10 MG CAPS Take 1 capsule (10 mg total) by mouth every morning. 05/16/15   Blanchie Dessert, MD  empagliflozin (JARDIANCE) 10 MG TABS tablet Take 10 mg by mouth daily. 02/28/15   [place] N Rumley, DO  fluticasone (FLONASE) 50 MCG/ACT nasal spray Place 2 sprays into both nostrils daily. 05/16/15   Blanchie Dessert, MD  insulin aspart (NOVOLOG) 100 UNIT/ML injection Inject 10-15 Units into the skin 3 (three) times daily before meals. 03/01/15   [place] N Rumley, DO  insulin glargine (LANTUS) 100 UNIT/ML injection Inject 0.15 mLs (15 Units total) into the skin daily. 02/28/15   [place] N Rumley, DO  Liraglutide 18 MG/3ML SOPN Inject 0.3 mLs (1.8 mg total) into the skin daily. 07/11/15   [place] N Rumley, DO  losartan (COZAAR) 50 MG tablet Take 1 tablet (50 mg total) by mouth daily. 03/09/15   [place] N Rumley, DO  methylphenidate (RITALIN LA) 30 MG 24 hr capsule Take 1 capsule (30 mg total) by mouth every morning. 06/29/15   [place] N Rumley, DO  methylphenidate (RITALIN LA) 30 MG 24 hr capsule Take 1 capsule (30 mg total) by mouth every morning. 06/29/15   [place] N Rumley, DO  methylphenidate (RITALIN LA) 30  MG 24 hr capsule Take 1 capsule (30 mg total) by mouth every morning. 06/29/15   [place] N Rumley, DO  olmesartan-hydrochlorothiazide (BENICAR HCT) 40-25 MG tablet Take 1 tablet by mouth daily. 07/02/15   [place] N Rumley, DO  rosuvastatin (CRESTOR) 10 MG tablet Take 1 tablet (10 mg total) by mouth daily. 05/25/15   [place] N Rumley, DO   BP 147/77 mmHg  Pulse 88  Temp(Src) 98.5 F (36.9 C) (Oral)  Resp 16  SpO2 100%  LMP 10/29/2015 Physical Exam  Constitutional: She is oriented to person, place, and time. She appears well-developed and well-nourished.  NAD  HENT:   Head: Normocephalic and atraumatic.  Eyes: Right eye exhibits no discharge.  Cardiovascular: Normal rate, regular rhythm and normal heart sounds.   No murmur heard. Pulmonary/Chest: Effort normal and breath sounds normal. She has no wheezes. She has no rales.  Musculoskeletal:  Patient has full range of motion of the right shoulder. Both active and passive motion intact. Patient has no evidence of rash.  Sensation intact.  Neurological: She is oriented to person, place, and time.  Skin: Skin is warm and dry. She is not diaphoretic.  Nursing note and vitals reviewed.   ED Course  Procedures (including critical care time) Labs Review Labs Reviewed - No data to display  Imaging Review No results found. I have personally reviewed and evaluated these images and lab results as part of my medical decision-making.   EKG Interpretation None      MDM   Final diagnoses:  None   Patient is a 50 year old female presenting with chronic right shoulder pain. Patient reports there is an exacerbation today. She has no new trauma. No erythema no rash.  We discussed that the best way to manage this pain would be to go to an orthopedist and potentially physical therapy. Patient says she tried this in the past and it doesn't work. She would like a shot of steroid. I do not think that emergency imaging is warranted at this time given that she has normal strength and range of motion. Especially since this is such a chronic issue.   Patient requesting steroid shot at this time. We can give her shot, but recommend that she follow up with ortho for more definitive care and PCP for better follow up than we can provide in this setting.   Offered xray, patient refusing.       Ziare Cryder Julio Alm, MD 11/01/15 1240

## 2015-11-01 NOTE — ED Notes (Signed)
She fell months ago. Has been having pain in her right shoulder since the fall.

## 2015-11-01 NOTE — Discharge Instructions (Signed)
Please follow up with a primary care provider about your shoulder pain. We also gave you the name of a sport therapist that may help. He may want to do physical therapy. You may require advanced imaging.  Please take ibuprofen to help with pain. We've given you a couple strong pain medications to use to help you with pain that is stopping you from sleeping. Please do not take these with alcohol or while driving.  Joint Pain Joint pain, which is also called arthralgia, can be caused by many things. Joint pain often goes away when you follow your health care provider's instructions for relieving pain at home. However, joint pain can also be caused by conditions that require further treatment. Common causes of joint pain include:  Bruising in the area of the joint.  Overuse of the joint.  Wear and tear on the joints that occur with aging (osteoarthritis).  Various other forms of arthritis.  A buildup of a crystal form of uric acid in the joint (gout).  Infections of the joint (septic arthritis) or of the bone (osteomyelitis). Your health care provider may recommend medicine to help with the pain. If your joint pain continues, additional tests may be needed to diagnose your condition. HOME CARE INSTRUCTIONS Watch your condition for any changes. Follow these instructions as directed to lessen the pain that you are feeling.  Take medicines only as directed by your health care provider.  Rest the affected area for as long as your health care provider says that you should. If directed to do so, raise the painful joint above the level of your heart while you are sitting or lying down.  Do not do things that cause or worsen pain.  If directed, apply ice to the painful area:  Put ice in a plastic bag.  Place a towel between your skin and the bag.  Leave the ice on for 20 minutes, 2-3 times per day.  Wear an elastic bandage, splint, or sling as directed by your health care provider. Loosen  the elastic bandage or splint if your fingers or toes become numb and tingle, or if they turn cold and blue.  Begin exercising or stretching the affected area as directed by your health care provider. Ask your health care provider what types of exercise are safe for you.  Keep all follow-up visits as directed by your health care provider. This is important. SEEK MEDICAL CARE IF:  Your pain increases, and medicine does not help.  Your joint pain does not improve within 3 days.  You have increased bruising or swelling.  You have a fever.  You lose 10 lb (4.5 kg) or more without trying. SEEK IMMEDIATE MEDICAL CARE IF:  You are not able to move the joint.  Your fingers or toes become numb or they turn cold and blue.   This information is not intended to replace advice given to you by your health care provider. Make sure you discuss any questions you have with your health care provider.   Document Released: 08/20/2005 Document Revised: 09/10/2014 Document Reviewed: 06/01/2014 Elsevier Interactive Patient Education 2016 [place] therapy can help ease sore, stiff, injured, and tight muscles and joints. Heat relaxes your muscles, which may help ease your pain.  RISKS AND COMPLICATIONS If you have any of the following conditions, do not use heat therapy unless your health care provider has approved:  Poor circulation.  Healing wounds or scarred skin in the area being treated.  Diabetes, heart disease, or high blood pressure.  Not being able to feel (numbness) the area being treated.  Unusual swelling of the area being treated.  Active infections.  Blood clots.  Cancer.  Inability to communicate pain. This may include young children and people who have problems with their brain function (dementia).  Pregnancy. Heat therapy should only be used on old, pre-existing, or long-lasting (chronic) injuries. Do not use heat therapy on new injuries unless  directed by your health care provider. HOW TO USE HEAT THERAPY There are several different kinds of heat therapy, including:  Moist heat pack.  Warm water bath.  Hot water bottle.  Electric heating pad.  Heated gel pack.  Heated wrap.  Electric heating pad. Use the heat therapy method suggested by your health care provider. Follow your health care provider's instructions on when and how to use heat therapy. GENERAL HEAT THERAPY RECOMMENDATIONS  Do not sleep while using heat therapy. Only use heat therapy while you are awake.  Your skin may turn pink while using heat therapy. Do not use heat therapy if your skin turns red.  Do not use heat therapy if you have new pain.  High heat or long exposure to heat can cause burns. Be careful when using heat therapy to avoid burning your skin.  Do not use heat therapy on areas of your skin that are already irritated, such as with a rash or sunburn. SEEK MEDICAL CARE IF:  You have blisters, redness, swelling, or numbness.  You have new pain.  Your pain is worse. MAKE SURE YOU:  Understand these instructions.  Will watch your condition.  Will get help right away if you are not doing well or get worse.   This information is not intended to replace advice given to you by your health care provider. Make sure you discuss any questions you have with your health care provider.   Document Released: 11/12/2011 Document Revised: 09/10/2014 Document Reviewed: 10/13/2013 Elsevier Interactive Patient Education [employer].

## 2015-11-01 NOTE — ED Notes (Signed)
MD at bedside. 

## 2015-11-02 ENCOUNTER — Ambulatory Visit: Payer: Self-pay | Admitting: Family Medicine

## 2015-11-03 ENCOUNTER — Encounter: Payer: Self-pay | Admitting: Family Medicine

## 2015-11-03 ENCOUNTER — Ambulatory Visit (INDEPENDENT_AMBULATORY_CARE_PROVIDER_SITE_OTHER): Payer: Self-pay | Admitting: Family Medicine

## 2015-11-03 VITALS — BP 104/71 | HR 82 | Ht 62.0 in | Wt 177.0 lb

## 2015-11-03 DIAGNOSIS — M25511 Pain in right shoulder: Secondary | ICD-10-CM

## 2015-11-03 MED ORDER — METHYLPREDNISOLONE ACETATE 40 MG/ML IJ SUSP
40.0000 mg | Freq: Once | INTRAMUSCULAR | Status: AC
  Administered 2015-11-03: 40 mg via INTRA_ARTICULAR

## 2015-11-03 NOTE — Patient Instructions (Signed)
You have a supraspinatus (rotator cuff) strain - I'm also concerned about possible labral tear of your shoulder. Start physical therapy in a week at the earliest. Do home exercises on days you don't go to therapy. Try to avoid painful activities (overhead activities, lifting with extended arm) as much as possible. Aleve 2 tabs twice a day with food OR ibuprofen 3 tabs three times a day with food for pain and inflammation. Can take tylenol in addition to this. Injection may be beneficial to help with pain and to decrease inflammation - you were given this today. If not improving at follow-up we will consider further imaging (MRI), nitro patches. Follow up with me in 5-6 weeks.

## 2015-11-04 ENCOUNTER — Ambulatory Visit: Payer: Self-pay | Admitting: Family Medicine

## 2015-11-04 ENCOUNTER — Telehealth: Payer: Self-pay | Admitting: Family Medicine

## 2015-11-04 MED ORDER — HYDROCODONE-ACETAMINOPHEN 5-325 MG PO TABS: 1.0000 | ORAL_TABLET | Freq: Four times a day (QID) | ORAL | Status: DC | PRN

## 2015-11-04 MED FILL — HYDROCODON-APAP 5-325: 5-325 | 7 days supply | Qty: 30 | Fill #0

## 2015-11-04 NOTE — Telephone Encounter (Signed)
Yes you can feel more sore after the shot and it takes a few days to kick in.  If she wants a short course of Norco I can print that out though she'd have to come pick it up.

## 2015-11-04 NOTE — Telephone Encounter (Signed)
Spoke to patient and gave her information provided by physician. Patient coming to pick up script.

## 2015-11-04 NOTE — Addendum Note (Signed)
Addended by: Dene Gentry on: 11/04/2015 09:18 AM   Modules accepted: Orders

## 2015-11-04 NOTE — Addendum Note (Signed)
Addended by: Sherrie George F on: 11/04/2015 09:17 AM   Modules accepted: Orders

## 2015-11-08 DIAGNOSIS — M25511 Pain in right shoulder: Secondary | ICD-10-CM | POA: Insufficient documentation
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
## 2015-11-08 NOTE — Assessment & Plan Note (Signed)
2/2 rotator cuff strain.  Possible labral tear with positive o'briens and remote injuries.  Start with physical therapy, nsaids.  Combination injection given today.  Consider MRI, nitro patches if not improving at f/u in 5-6 weeks.  After informed written consent, patient was seated on exam table. Right shoulder was prepped with alcohol swab and utilizing posterior approach, patient's right shoulder was injected with 6:2 marcaine:depomedrol with half in the subacromial space and half in glenohumeral space.  Patient tolerated the procedure well without immediate complications.

## 2015-11-15 ENCOUNTER — Ambulatory Visit: Payer: Self-pay | Admitting: Physical Therapy

## 2015-11-19 ENCOUNTER — Telehealth: Payer: Self-pay | Admitting: Family Medicine

## 2015-11-19 MED ORDER — INSULIN ASPART 100 UNIT/ML ~~LOC~~ SOLN: 10.0000 [IU] | Freq: Three times a day (TID) | SUBCUTANEOUS | Status: DC

## 2015-11-19 NOTE — Telephone Encounter (Signed)
Family Medicine After hours phone call  Patient called reporting URI symptoms she believes has caused her CGB to elevate. CBG ~500. States she has never had a value this high. Has been compliant w/ meds. No n/v/d, eating/drinking well. She states she doesn't feel well but thinks this is due to URI. Took theraflu which has not helped. Patient also took 30units of novolog  5 minutes prior to our conversation. She typically takes 10-15 u TID. Asking what to do.  We first discussed symptoms of hypoglycemia and what to do if these develop. I asked that she take her CBG in 30-60min and then Q2hr after that to ensure no hypoglycemia as her body is not use to that amount of novolog as a bolus. In the AM she is to increase her Lantus dose to 18units if her AM CBG is >250. She is to use the same number to contact the day team tomorrow ~12pm for any help w/ CBG elevations tomorrow.   If she starts having n/v/d, hypoglycemia, or constantly high CBGs in the 500+s, or she has any doubt about her ability to get her CBGs under control she is to go to the nearest ED/UC.  Patient stated her understanding.   Patient also asking if Dr. Gerlean Ren is working this wknd. She would like a call at her earliest convenience.   Patient had no further questions.    Elberta Leatherwood, MD,MS,  PGY2 11/19/2015 1:38 AM

## 2015-11-23 ENCOUNTER — Ambulatory Visit: Payer: Self-pay | Attending: Family Medicine | Admitting: Physical Therapy

## 2015-11-23 DIAGNOSIS — F411 Generalized anxiety disorder: Secondary | ICD-10-CM | POA: Insufficient documentation

## 2015-11-23 DIAGNOSIS — R29898 Other symptoms and signs involving the musculoskeletal system: Secondary | ICD-10-CM | POA: Insufficient documentation

## 2015-11-23 DIAGNOSIS — R293 Abnormal posture: Secondary | ICD-10-CM | POA: Insufficient documentation

## 2015-11-23 DIAGNOSIS — M25511 Pain in right shoulder: Secondary | ICD-10-CM | POA: Insufficient documentation

## 2015-11-23 DIAGNOSIS — F908 Attention-deficit hyperactivity disorder, other type: Secondary | ICD-10-CM | POA: Insufficient documentation

## 2015-11-23 NOTE — Therapy (Signed)
[facility] [address]  [place], [place], [address] Phone: [phone]   Fax:  [phone]  Physical Therapy Evaluation  Patient Details  Name: Tracy Weiss MRN: [id number] Date of Birth: 1966/03/05 Referring Provider: Karlton Lemon, MD  Encounter Date: 11/23/2015      PT End of Session - 11/23/15 1130    Visit Number 1   Number of Visits 12   Date for PT Re-Evaluation 01/04/16   PT Start Time 1020   PT Stop Time 1118   PT Time Calculation (min) 58 min   Activity Tolerance Patient tolerated treatment well;Patient limited by pain   Behavior During Therapy [facility] for tasks assessed/performed      Past Medical History  Diagnosis Date  . Diabetes mellitus without complication [facility])     Past Surgical History  Procedure Laterality Date  . Cesarean section      There were no vitals filed for this visit.  Visit Diagnosis:  Right shoulder pain  Abnormal posture  Weakness of right arm      Subjective Assessment - 11/23/15 1024    Subjective Pt reports pain in R shoudler started after reaching to catch herself when she slipped in the laundry room ~6 mo ago. Pain really began to worsen late Dec/early Jan and now reports pain extending down arm and into neck. Received injection in R shoulder ~3 wks ago with no relief of pain. Pt wanting to return to MD for MRI of shoulder.   Limitations House hold activities   Diagnostic tests Per MD note, MRI in 5-6 weeks if not improving.   Patient Stated Goals "Figure out while my arm is hurting"   Currently in Pain? Yes   Pain Score 8   Least 5/10, Avg 8/10, Worst 10+/10   Pain Location Shoulder   Pain Orientation Right;Anterior;Posterior   Pain Descriptors / Indicators Dull;Aching   Pain Type Chronic pain   Pain Radiating Towards Down arm to mid forearm and up into lateral side of neck   Pain Onset More than a month ago   Pain Frequency Constant   Aggravating Factors   Reaching overhead or across body, Quick motions   Pain Relieving Factors Nothing except heat at times   Effect of Pain on Daily Activities Difficulty dresssing upper body (shirts, coats), Using R arm during ADL's and household chores            [facility] PT Assessment - 11/23/15 1020    Assessment   Medical Diagnosis R RTC strain   Referring Provider Karlton Lemon, MD   Onset Date/Surgical Date --  ~6 mo ago   Hand Dominance Right   Next MD Visit Pt planning to schedule appt   Prior Therapy none   Balance Screen   Has the patient fallen in the past 6 months No   Has the patient had a decrease in activity level because of a fear of falling?  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   Leisure [employer]   Observation/Other Assessments   Focus on Therapeutic Outcomes (FOTO)  44% (56% limitation); Predicted 65% (35% limitation)   Posture/Postural Control   Posture/Postural Control Postural limitations   Postural Limitations Rounded Shoulders;Forward head   ROM / Strength   AROM / PROM / Strength AROM   AROM   AROM Assessment Site Shoulder   Right/Left Shoulder  Right;Left   Right Shoulder Flexion 122 Degrees   Right Shoulder ABduction 95 Degrees   Right Shoulder Internal Rotation 41 Degrees   Right Shoulder External Rotation 50 Degrees   Left Shoulder Flexion 153 Degrees   Left Shoulder ABduction 164 Degrees   Left Shoulder Internal Rotation 68 Degrees   Left Shoulder External Rotation 82 Degrees   Strength   Overall Strength Comments MMT deferred due high pain intensity   Palpation   Palpation comment ttp over R anterior & lateral shoulder, biceps and upper traps with TP's noted in biceps and UT         Today's Treatment  Manual STM and TRP to upper trap and biceps  TherEx Postural awareness training Scapular Retraction 10x5" Doorway stretch 2x20" Shoulder Rows with red TB  10x3" Scapular Retraction + Shoulder Extension to neutral with red TB 10x3"  Modalities Pre-Mod estim (10-20 Hz) - Channel 1 to R UT, channel 2 to R anterior shoulder/biceps, intensity to pt tolerance x10' Moist heat pack to R shoulder x 10'          PT Education - 11/23/15 1133    Education provided Yes   Education Details PT eval findings, POC, Initial HEP and role of Estim   Person(s) Educated Patient   Methods Explanation;Demonstration;Tactile cues;Verbal cues;Handout   Comprehension Verbalized understanding;Returned demonstration;Verbal cues required;Tactile cues required;Need further instruction          PT Short Term Goals - 11/23/15 1158    PT SHORT TERM GOAL #1   Title Pt will be independent with initial HEP by 12/07/15   Status New   PT SHORT TERM GOAL #2   Title Pt will demonstrate improved awareness of neutral shoulder posture by 12/07/15   Status New           PT Long Term Goals - 11/23/15 1159    PT LONG TERM GOAL #1   Title Pt will be independent with advanced HEP by 01/04/16   Status New   PT LONG TERM GOAL #2   Title Pt will demonstrate R shoulder ROM within 10 degrees of L without increased pain by 01/04/16   Status New   PT LONG TERM GOAL #3   Title Pt will demonstrate R shoulder strength 4/5 or greater by 01/04/16   Status New   PT LONG TERM GOAL #4   Title Pt will report ability to complete ADL's including bathing and dressing without increased R shoudler pain by 01/04/16   Status New   PT LONG TERM GOAL #5   Title Pt will report ability to complete household chores including reaching and lifting without increased R shoudler pain by 01/04/16   Status New               Plan - 11/23/15 1134    Clinical Impression Statement Pt is a 50 y/o female who presents to OP PT with R shoulder pain of ~6 months duration, worsening since the 1st of the year. Pt reports intense pain averaging 8/10 in anterior/lateral/posterior shoulder with radicular pain into  upper trap and down arm to mid forearm which interferes with sleeping and functional use of dominant R UE with ADL's, especially bathing and dressing, and household chores. Palpation revealed increased muscle tension in upper trap, deltoids, pecs and biceps with ttp over biceps and RTC insertions and trigger points in upper trap and biceps. R shoulder ROM restricted by pain in all movements with IR deficit also noted on L (pt reports  she has never been able to fasten her bra behind her back). MMT deferred due to high intensity of pain today with all attempted activities. Initial treatment focused on postural correction with STM/TPR to upper trap and biceps, followed by trianing in basic postural stretches and exercises, then Pre-mod Estim to R UT & anterior shoulder/biceps. Pt reporting decreased pain to 5/10 (lowest pain recently reported by pt) by end of visit.   Pt will benefit from skilled therapeutic intervention in order to improve on the following deficits Pain;Postural dysfunction;Decreased range of motion;Decreased strength;Increased muscle spasms;Impaired UE functional use   Rehab Potential Good   PT Frequency 2x / week   PT Duration 6 weeks   PT Treatment/Interventions Patient/family education;Electrical Stimulation;Moist Heat;Iontophoresis 4mg /ml Dexamethasone;Cryotherapy;Vasopneumatic Device;Therapeutic exercise;Neuromuscular re-education;ADLs/Self Care Home Management;Manual techniques;Taping;Dry needling   PT Next Visit Plan Assess response to estim; Review initial HEP; Postural training; Shoulder/scapular ROM & strengthening; Manual therapy for STM and ROM; Modalities PRN with trial of ionto patch if still acutely irritated   Consulted and Agree with Plan of Care Patient         Problem List Patient Active Problem List   Diagnosis Date Noted  . Right shoulder pain 11/08/2015  . Generalized anxiety disorder 03/30/2015  . Memory difficulties 09/01/2014  . PCOS (polycystic ovarian  syndrome) 06/25/2012  . IBS (irritable bowel syndrome) 12/29/2010  . Essential hypertension, benign 07/07/2009  . OBESITY 04/13/2009  . Diabetes mellitus without complication ([place]) 0000000  . Attention deficit disorder 10/31/2006    Percival Spanish, PT, MPT 11/23/2015, 12:07 PM  [facility] [address]  [place], [place], [address] Phone: [phone]   Fax:  [phone]  Name: Tracy Weiss MRN: [id number] Date of Birth: [birth year]

## 2015-11-30 ENCOUNTER — Ambulatory Visit: Payer: Self-pay

## 2015-11-30 DIAGNOSIS — F411 Generalized anxiety disorder: Secondary | ICD-10-CM

## 2015-11-30 DIAGNOSIS — F908 Attention-deficit hyperactivity disorder, other type: Secondary | ICD-10-CM

## 2015-11-30 DIAGNOSIS — M25511 Pain in right shoulder: Secondary | ICD-10-CM

## 2015-11-30 DIAGNOSIS — R29898 Other symptoms and signs involving the musculoskeletal system: Secondary | ICD-10-CM

## 2015-11-30 DIAGNOSIS — R293 Abnormal posture: Secondary | ICD-10-CM

## 2015-11-30 NOTE — Therapy (Signed)
[facility] [address]  [place], [place], [address] Phone: [phone]   Fax:  [phone]  Physical Therapy Treatment  Patient Details  Name: Tracy Weiss MRN: [id number] Date of Birth: 05/18/1966 Referring Provider: Karlton Lemon, MD  Encounter Date: 11/30/2015      PT End of Session - 11/30/15 1351    Visit Number 2   Number of Visits 12   Date for PT Re-Evaluation 01/04/16   PT Start Time 0933   PT Stop Time 1027   PT Time Calculation (min) 54 min   Activity Tolerance Patient tolerated treatment well;Patient limited by pain   Behavior During Therapy [facility] for tasks assessed/performed      Past Medical History  Diagnosis Date  . Diabetes mellitus without complication [facility])     Past Surgical History  Procedure Laterality Date  . Cesarean section      There were no vitals filed for this visit.  Visit Diagnosis:  Right shoulder pain  Abnormal posture  Attention-deficit hyperactivity disorder, other type  Generalized anxiety disorder  Weakness of right arm      Subjective Assessment - 11/30/15 1007    Subjective Pt. reports 5/10 R shoulder pain today initially.     Patient Stated Goals "Figure out while my arm is hurting"   Currently in Pain? Yes   Pain Score 5    Pain Location Shoulder   Pain Orientation Right;Anterior;Posterior   Pain Descriptors / Indicators Dull;Aching   Pain Type Chronic pain   Pain Onset More than a month ago   Pain Frequency Constant   Aggravating Factors  reaching overhead or across body.    Multiple Pain Sites No       Today's Treatment  Manual STM and TRP to upper trap and R shoulder   TherEx Lying on 2" half white bolster:        Anterior chest stretch x 2"        B shoulder abd / scapular retraction with red TB x 15 reps  Seated scapular Retraction 10x5" Doorway stretch 2x20" Corner stretch x 30 sec Seated shoulder Rows with red TB 10x3" Seated B  shoulder ER with yellow TB x 15 reps Supine B shoulder ER with yellow TB x 15 reps  Supine R shoulder IR with yellow TB x 15 reps Standing shoulder row with blue TB x 15 reps  Standing shoulder extension with retraction x 15 reps  Modalities Pre-Mod estim (10-20 Hz) - Channel 1 to R UT, channel 2 to R anterior shoulder/biceps, intensity to pt tolerance x10'        PT Education - 11/30/15 1429    Education provided Yes   Education Details Current HEP review    Person(s) Educated Patient   Methods Verbal cues;Explanation;Demonstration   Comprehension Verbalized understanding;Returned demonstration;Verbal cues required          PT Short Term Goals - 11/30/15 1422    PT SHORT TERM GOAL #1   Title Pt will be independent with initial HEP by 12/07/15   Status On-going   PT SHORT TERM GOAL #2   Title Pt will demonstrate improved awareness of neutral shoulder posture by 12/07/15   Status On-going           PT Long Term Goals - 11/30/15 1422    PT LONG TERM GOAL #1   Title Pt will be independent with advanced HEP by 01/04/16   Status On-going   PT  LONG TERM GOAL #2   Title Pt will demonstrate R shoulder ROM within 10 degrees of L without increased pain by 01/04/16   Status On-going   PT LONG TERM GOAL #3   Title Pt will demonstrate R shoulder strength 4/5 or greater by 01/04/16   Status On-going   PT LONG TERM GOAL #4   Title Pt will report ability to complete ADL's including bathing and dressing without increased R shoudler pain by 01/04/16   Status On-going   PT LONG TERM GOAL #5   Title Pt will report ability to complete household chores including reaching and lifting without increased R shoudler pain by 01/04/16   Status On-going               Plan - 11/30/15 1012    Clinical Impression Statement Pt. with initial R shoulder pain of 5/10 today.  Pt. tolerated scapular strengthening and R shoulder strenthening activity well today with no pain increase.  STM / TPR to R  shoulder and UT today with pain decrease reported following massage.  R shoulder IR / ER with yellow added with pt. tolerating well today.     PT Next Visit Plan Postural training; Shoulder/scapular ROM & strengthening; Manual therapy for STM and ROM; Modalities PRN with trial of ionto patch if still acutely irritated        Problem List Patient Active Problem List   Diagnosis Date Noted  . Right shoulder pain 11/08/2015  . Generalized anxiety disorder 03/30/2015  . Memory difficulties 09/01/2014  . PCOS (polycystic ovarian syndrome) 06/25/2012  . IBS (irritable bowel syndrome) 12/29/2010  . Essential hypertension, benign 07/07/2009  . OBESITY 04/13/2009  . Diabetes mellitus without complication ([place]) 0000000  . Attention deficit disorder 10/31/2006    Bess Harvest, PTA 11/30/2015, 2:37 PM  [facility] [address]  [place], [place], [address] Phone: [phone]   Fax:  [phone]  Name: Tracy Weiss MRN: [id number] Date of Birth: 13-Feb-1966

## 2015-12-05 ENCOUNTER — Other Ambulatory Visit: Payer: Self-pay

## 2015-12-05 DIAGNOSIS — Z1231 Encounter for screening mammogram for malignant neoplasm of breast: Secondary | ICD-10-CM

## 2015-12-06 ENCOUNTER — Ambulatory Visit: Payer: Self-pay | Attending: Family Medicine | Admitting: Physical Therapy

## 2015-12-06 ENCOUNTER — Other Ambulatory Visit: Payer: Self-pay | Admitting: Family Medicine

## 2015-12-06 DIAGNOSIS — R293 Abnormal posture: Secondary | ICD-10-CM | POA: Insufficient documentation

## 2015-12-06 DIAGNOSIS — R29898 Other symptoms and signs involving the musculoskeletal system: Secondary | ICD-10-CM | POA: Insufficient documentation

## 2015-12-06 DIAGNOSIS — M25511 Pain in right shoulder: Secondary | ICD-10-CM | POA: Insufficient documentation

## 2015-12-06 NOTE — Telephone Encounter (Signed)
Pt called and needs a refill on her Ritalin left

## 2015-12-06 NOTE — Therapy (Signed)
[facility] [address]  [place], [place], [address] Phone: [phone]   Fax:  [phone]  Physical Therapy Treatment  Patient Details  Name: Tracy Weiss MRN: [id number] Date of Birth: Apr 17, 1966 Referring Provider: Karlton Lemon, MD  Encounter Date: 12/06/2015      PT End of Session - 12/06/15 0839    Visit Number 3   Number of Visits 12   Date for PT Re-Evaluation 01/04/16   PT Start Time 0835   PT Stop Time 0908  Pt needing to leave early for MD appt   PT Time Calculation (min) 33 min   Activity Tolerance Patient tolerated treatment well   Behavior During Therapy [facility] for tasks assessed/performed      Past Medical History  Diagnosis Date  . Diabetes mellitus without complication [facility])     Past Surgical History  Procedure Laterality Date  . Cesarean section      There were no vitals filed for this visit.  Visit Diagnosis:  Right shoulder pain  Abnormal posture  Weakness of right arm      Subjective Assessment - 12/06/15 0836    Subjective Pt reporting pain much better with improving ability to reach across body and behind back without pain. Does still have occasional pain but much less intense than when she started PT.   Patient Stated Goals "Figure out while my arm is hurting"   Currently in Pain? No/denies          Today's Treatment  TherEx UBE - lvl 1 Fwd/Back x 90" each Hooklying on 1/2 foam roll:   Anterior chest/pec stretch x 2'  B Shoulder Horiz ABD / scapular retraction with green TB x15   B Shoulder ER with red TB x15    B Alternating Shoulder Flexion/Extension with red TB x10   B Flexion pullover with 5# 10x3"  Manual STM and TRP to upper trap and R shoulder in supine and L sidelying  TherEx L sidelying Open book stretch 5x5" Seated B UT stretch 2x30" Seated B Levator stretch 2x30"         PT Education - 12/06/15 0916    Education provided Yes   Education  Details Addition of UT and levator stretches to HEP   Person(s) Educated Patient   Methods Explanation;Demonstration;Handout;Verbal cues   Comprehension Verbalized understanding;Returned demonstration;Verbal cues required          PT Short Term Goals - 12/06/15 0914    PT SHORT TERM GOAL #1   Title Pt will be independent with initial HEP by 12/07/15   Status Achieved   PT SHORT TERM GOAL #2   Title Pt will demonstrate improved awareness of neutral shoulder posture by 12/07/15   Status Achieved           PT Long Term Goals - 12/06/15 0914    PT LONG TERM GOAL #1   Title Pt will be independent with advanced HEP by 01/04/16   Status On-going   PT LONG TERM GOAL #2   Title Pt will demonstrate R shoulder ROM within 10 degrees of L without increased pain by 01/04/16   Status On-going   PT LONG TERM GOAL #3   Title Pt will demonstrate R shoulder strength 4/5 or greater by 01/04/16   Status On-going   PT LONG TERM GOAL #4   Title Pt will report ability to complete ADL's including bathing and dressing without increased R shoudler pain by 01/04/16  Status On-going   PT LONG TERM GOAL #5   Title Pt will report ability to complete household chores including reaching and lifting without increased R shoudler pain by 01/04/16   Status On-going               Plan - 12/06/15 0911    Clinical Impression Statement Pt demonstrating excellent initial progress with PT with good relief of pain and increased functional use of R UE. Still noting some increased muscle tension in R UT and levator with 1 TP in UT, therefore continued focus on manual therapy to this area and added UT and levator stretches to HEP.   PT Next Visit Plan Postural training; Shoulder/scapular ROM & strengthening; Manual therapy for STM and ROM; Modalities PRN with trial of ionto patch if still acutely irritated   Consulted and Agree with Plan of Care Patient        Problem List Patient Active Problem List   Diagnosis Date  Noted  . Right shoulder pain 11/08/2015  . Generalized anxiety disorder 03/30/2015  . Memory difficulties 09/01/2014  . PCOS (polycystic ovarian syndrome) 06/25/2012  . IBS (irritable bowel syndrome) 12/29/2010  . Essential hypertension, benign 07/07/2009  . OBESITY 04/13/2009  . Diabetes mellitus without complication ([place]) 0000000  . Attention deficit disorder 10/31/2006    Percival Spanish, PT, MPT 12/06/2015, 9:18 AM  [facility] [address]  [place], [place], [address] Phone: [phone]   Fax:  [phone]  Name: Tracy Weiss MRN: [id number] Date of Birth: 1966/07/12

## 2015-12-08 ENCOUNTER — Ambulatory Visit: Payer: Self-pay

## 2015-12-12 MED ORDER — METHYLPHENIDATE HCL ER (LA) 30 MG PO CP24: 30.0000 mg | ORAL_CAPSULE | ORAL | Status: DC

## 2015-12-12 NOTE — Telephone Encounter (Signed)
Prescriptions printed and left at front desk.

## 2015-12-12 NOTE — Telephone Encounter (Signed)
Pt is calling back to check the status of her request for Ritalin. She took the last pill today. Please call and let her know when to pick this up. If she doesn't answer it is okay to leave a detailed message. jw

## 2015-12-13 ENCOUNTER — Ambulatory Visit: Payer: Self-pay | Admitting: Physical Therapy

## 2015-12-13 DIAGNOSIS — R29898 Other symptoms and signs involving the musculoskeletal system: Secondary | ICD-10-CM

## 2015-12-13 DIAGNOSIS — R293 Abnormal posture: Secondary | ICD-10-CM

## 2015-12-13 DIAGNOSIS — M25511 Pain in right shoulder: Secondary | ICD-10-CM

## 2015-12-13 NOTE — Therapy (Signed)
[facility] [address]  [place], [place], [address] Phone: [phone]   Fax:  [phone]  Physical Therapy Treatment  Patient Details  Name: Tracy Weiss MRN: [id number] Date of Birth: 03/01/1966 Referring Provider: Karlton Lemon, MD  Encounter Date: 12/13/2015      PT End of Session - 12/13/15 0854    Visit Number 4   Number of Visits 12   Date for PT Re-Evaluation 01/04/16   PT Start Time 0849   PT Stop Time 0935   PT Time Calculation (min) 46 min   Activity Tolerance Patient tolerated treatment well   Behavior During Therapy [facility] for tasks assessed/performed      Past Medical History  Diagnosis Date  . Diabetes mellitus without complication [facility])     Past Surgical History  Procedure Laterality Date  . Cesarean section      There were no vitals filed for this visit.      Subjective Assessment - 12/13/15 0855    Subjective Pt without pain this morning but reports still unable to sleep on right side due to pain. Also still noting limitations with reaching overhead and dressing due to shoulder tightness.   Currently in Pain? 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   Leisure [employer]   ROM / Strength   AROM / PROM / Strength AROM;Strength   AROM   AROM Assessment Site Shoulder   Right/Left Shoulder Right   Right Shoulder Flexion 150 Degrees   Right Shoulder ABduction 146 Degrees   Right Shoulder Internal Rotation 62 Degrees   Right Shoulder External Rotation 55 Degrees   Strength   Strength Assessment Site Shoulder   Right/Left Shoulder Right;Left   Right Shoulder Flexion 4/5   Right Shoulder ABduction 4/5    Right Shoulder Internal Rotation 4-/5   Right Shoulder External Rotation 4-/5   Left Shoulder Flexion 4+/5   Left Shoulder ABduction 4+/5   Left Shoulder Internal Rotation 4+/5   Left Shoulder External Rotation 4+/5           Today's Treatment  TherEx UBE - lvl 2.0 Fwd/Back x 90" each Hooklying on 1/2 foam roll:  Anterior chest/pec stretch x 2'  B Shoulder Horiz ABD + scapular retraction with green TB x15  B Shoulder ER with green TB x15   B Alternating Shoulder Flexion/Extension with green TB x15  B Flexion pullover with 5# 10x3"   R Shoulder Protraction/Serratus Punch 5# x10   R Shoulder Circles CW/CCW 5# x10 each BATCA Low Row 20# x10, 25# x10 Shoulder Rows with green TB 15x3" Scapular Retraction + Shoulder Extension to neutral with green TB 15x3" "W" Row with green TB 10x3"  Manual STM and TRP to upper trap and R shoulder in supine and L sidelying Grade 2-3 R shoulder A/P and inferior mobs  ROM & MMT check          PT Short Term Goals - 12/06/15 0914    PT SHORT TERM GOAL #1   Title Pt will be independent with initial HEP by 12/07/15  Status Achieved   PT SHORT TERM GOAL #2   Title Pt will demonstrate improved awareness of neutral shoulder posture by 12/07/15   Status Achieved           PT Long Term Goals - 12/13/15 0937    PT LONG TERM GOAL #1   Title Pt will be independent with advanced HEP by 01/04/16   Status On-going   PT LONG TERM GOAL #2   Title Pt will demonstrate R shoulder ROM within 10 degrees of L without increased pain by 01/04/16   Status Partially Met  Met for flexion and IR   PT LONG TERM GOAL #3   Title Pt will demonstrate R shoulder strength 4/5 or greater by 01/04/16   Status Partially Met  Met for flexion and abduction   PT LONG TERM GOAL #4   Title Pt will report ability to complete ADL's including bathing and dressing without increased R shoudler pain by 01/04/16   Status Partially Met   PT LONG TERM GOAL #5   Title Pt will  report ability to complete household chores including reaching and lifting without increased R shoudler pain by 01/04/16   Status Partially Met               Plan - 12/13/15 1301    Clinical Impression Statement Pt reporting a 70% improvement since starting PT with no pain at rest and only slight pain with reaching in certain directions. Pt demostrating significant improvement in R shoudler ROM in all planes, but remains most limited functionally in IR and ER with continued tightness and pain at end ROM. Pt reporting increased ease of dressing and ADL performance but still feels weaker on R vs L with MMT revealing 4-/5 to 4/5 on R vs 4+/5 or greater on L. Pt will continue to benefit from skilled PT to restore functional ROM and strength in R shoudler without limitation due to pain.   Rehab Potential Good   PT Treatment/Interventions Patient/family education;Electrical Stimulation;Moist Heat;Iontophoresis 81m/ml Dexamethasone;Cryotherapy;Vasopneumatic Device;Therapeutic exercise;Neuromuscular re-education;ADLs/Self Care Home Management;Manual techniques;Taping;Dry needling;Passive range of motion   PT Next Visit Plan Postural training; Shoulder/scapular ROM & strengthening; Manual therapy for STM and ROM; Modalities PRN    Consulted and Agree with Plan of Care Patient      Patient will benefit from skilled therapeutic intervention in order to improve the following deficits and impairments:  Pain, Postural dysfunction, Decreased range of motion, Decreased strength, Increased muscle spasms, Impaired flexibility, Impaired UE functional use  Visit Diagnosis: Pain in right shoulder  Abnormal posture  Other symptoms and signs involving the musculoskeletal system     Problem List Patient Active Problem List   Diagnosis Date Noted  . Right shoulder pain 11/08/2015  . Generalized anxiety disorder 03/30/2015  . Memory difficulties 09/01/2014  . PCOS (polycystic ovarian syndrome) 06/25/2012   . IBS (irritable bowel syndrome) 12/29/2010  . Essential hypertension, benign 07/07/2009  . OBESITY 04/13/2009  . Diabetes mellitus without complication ([place] 053/97/6734 . Attention deficit disorder 10/31/2006    JPercival Spanish PT, MPT 12/13/2015, 1:15 PM  [facility] [place] [place] [address]Phone: [phone]  Fax:  [phone] Name: DThia OlesenMRN: [id number] of Birth: [birth year]

## 2015-12-13 NOTE — Telephone Encounter (Signed)
Patient informed. Sharon T Saunders, CMA  

## 2015-12-15 ENCOUNTER — Ambulatory Visit: Payer: Self-pay | Admitting: Physical Therapy

## 2015-12-15 ENCOUNTER — Ambulatory Visit: Payer: Self-pay | Admitting: Family Medicine

## 2015-12-20 ENCOUNTER — Encounter (INDEPENDENT_AMBULATORY_CARE_PROVIDER_SITE_OTHER): Payer: Self-pay

## 2015-12-20 ENCOUNTER — Encounter: Payer: Self-pay | Admitting: Family Medicine

## 2015-12-20 ENCOUNTER — Ambulatory Visit (INDEPENDENT_AMBULATORY_CARE_PROVIDER_SITE_OTHER): Payer: Self-pay | Admitting: Family Medicine

## 2015-12-20 ENCOUNTER — Ambulatory Visit: Payer: Self-pay

## 2015-12-20 VITALS — BP 113/75 | HR 80 | Ht 61.0 in | Wt 178.6 lb

## 2015-12-20 DIAGNOSIS — M25511 Pain in right shoulder: Secondary | ICD-10-CM
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
## 2015-12-20 NOTE — Assessment & Plan Note (Signed)
2/2 rotator cuff strain, possible labral tear.  Doing extremely well with conservative measures (combination subacromial and glenohumeral injection, physical therapy).  Continue PT for 2 more weeks then home exercises.  Nsaids as needed.  Consider MRI, nitro patches if not continuing to improve.  F/u prn.

## 2015-12-20 NOTE — Patient Instructions (Signed)
I would do 2 more weeks of PT then transition to a home exercise program. Do home exercises most days of the week for 6 more weeks. Follow up with me then if needed.

## 2015-12-21 ENCOUNTER — Ambulatory Visit (INDEPENDENT_AMBULATORY_CARE_PROVIDER_SITE_OTHER): Payer: Self-pay | Admitting: Family Medicine

## 2015-12-21 VITALS — BP 122/77 | HR 84 | Temp 98.6°F | Ht 61.0 in | Wt 177.6 lb

## 2015-12-21 DIAGNOSIS — B9689 Other specified bacterial agents as the cause of diseases classified elsewhere: Secondary | ICD-10-CM

## 2015-12-21 DIAGNOSIS — N760 Acute vaginitis: Secondary | ICD-10-CM

## 2015-12-21 DIAGNOSIS — N898 Other specified noninflammatory disorders of vagina: Secondary | ICD-10-CM

## 2015-12-21 DIAGNOSIS — A499 Bacterial infection, unspecified: Secondary | ICD-10-CM

## 2015-12-21 LAB — POCT WET PREP (WET MOUNT): Clue Cells Wet Prep Whiff POC: NEGATIVE

## 2015-12-21 MED ORDER — METRONIDAZOLE 0.75 % VA GEL: 1.0000 | Freq: Two times a day (BID) | VAGINAL | Status: DC

## 2015-12-21 MED ORDER — FLUCONAZOLE 150 MG PO TABS: 150.0000 mg | ORAL_TABLET | Freq: Once | ORAL | Status: DC

## 2015-12-21 NOTE — Patient Instructions (Addendum)
Follow up with Dr Gerlean Ren as scheduled.  Bacterial Vaginosis Bacterial vaginosis is an infection of the vagina. It happens when too many germs (bacteria) grow in the vagina. Having this infection puts you at risk for getting other infections from sex. Treating this infection can help lower your risk for other infections, such as:   Chlamydia.  Gonorrhea.  HIV.  Herpes. HOME CARE  Take your medicine as told by your doctor.  Finish your medicine even if you start to feel better.  Tell your sex partner that you have an infection. They should see their doctor for treatment.  During treatment:  Avoid sex or use condoms correctly.  Do not douche.  Do not drink alcohol unless your doctor tells you it is ok.  Do not breastfeed unless your doctor tells you it is ok. GET HELP IF:  You are not getting better after 3 days of treatment.  You have more grey fluid (discharge) coming from your vagina than before.  You have more pain than before.  You have a fever. MAKE SURE YOU:   Understand these instructions.  Will watch your condition.  Will get help right away if you are not doing well or get worse.   This information is not intended to replace advice given to you by your health care provider. Make sure you discuss any questions you have with your health care provider.   Document Released: 05/29/2008 Document Revised: 09/10/2014 Document Reviewed: 04/01/2013 Elsevier Interactive Patient Education [employer].

## 2015-12-21 NOTE — Progress Notes (Signed)
   Subjective: [id number] discharge [id number] Tracy Weiss is a 50 y.o. female presenting to clinic today for same day appointment. PCP: Junie Panning, DO Concerns today include:  1. Vaginal Discharge Patient reports that discharge started after she completed her menstrual cycle on Sunday.  She notes that discharge appears white and chunky.  She endorses vaginal odors.  She denies vaginal pruritis, abnormal vaginal bleeding, dysuria, hematuria, pelvic pain, nausea, vomiting, fevers.   She notes that she often has BV.  She has used Metrogel per vagina but did not have enough medication.   no history of STIs.  She is occ sexually active and does not use condoms.  Contraception: none.  Patient's last menstrual period was 12/15/2015 (exact date).  Social History Reviewed. FamHx and MedHx reviewed.  Please see EMR.  ROS: Per HPI  Objective: Office vital signs reviewed. BP 122/77 mmHg  Pulse 84  Temp(Src) 98.6 F (37 C) (Oral)  Ht 5\' 1"  (1.549 m)  Wt 177 lb 9.6 oz (80.559 kg)  BMI 33.57 kg/m2  LMP 12/15/2015 (Exact Date)  Physical Examination:  General: Awake, alert, well nourished, No acute distress HEENT: Normal, MMM GI: soft, non-tender, non-distended, bowel sounds present x4 GU: no suprapbuic TTP. No palpable adnexal masses. Vaginal exam deferred/ patient declined exam.   Results for orders placed or performed in visit on 12/21/15 (from the past 24 hour(s))  POCT Wet Prep [facility])     Status: Abnormal   Collection Time: 12/21/15  2:29 AM  Result Value Ref Range   Source Wet Prep POC vaginal    WBC, Wet Prep HPF POC 1-5    Bacteria Wet Prep HPF POC Few None, Few, Too numerous to count   Clue Cells Wet Prep HPF POC Few (A) None, Too numerous to count   Clue Cells Wet Prep Whiff POC Negative Whiff    Yeast Wet Prep HPF POC None    Trichomonas Wet Prep HPF POC none    Assessment/ Plan: 50 y.o. female   1. Vaginal discharge.  Clue cells on wet prep. - POCT Wet Prep [facility]),  self obtained  2. Bacterial vaginosis - Metronidazole vaginal gel x7 days - Diflucan 150mg  #2 tablets also provided.  Patient to take 1 tablet now.  May repeat in 3 days if needed. - Return precautions reviewed - Follow up with PCP as directed  Additionally, patient noted that she needed medication for anxiety refilled.  It appears that she has been prescribed Xanax in the past by her PCP.  This is no longer on her active med list.  Discussed that this would be better refilled by her PCP.  She seemed amenable to this.   Janora Norlander, DO PGY-2, [place]

## 2015-12-22 ENCOUNTER — Ambulatory Visit: Payer: Self-pay | Admitting: Physical Therapy

## 2015-12-22 DIAGNOSIS — R293 Abnormal posture: Secondary | ICD-10-CM

## 2015-12-22 DIAGNOSIS — M25511 Pain in right shoulder: Secondary | ICD-10-CM

## 2015-12-22 DIAGNOSIS — R29898 Other symptoms and signs involving the musculoskeletal system: Secondary | ICD-10-CM

## 2015-12-22 NOTE — Therapy (Signed)
[facility] [address]  [place], [place], [address] Phone: [phone]   Fax:  [phone]  Physical Therapy Treatment  Patient Details  Name: Tracy Weiss MRN: [id number] Date of Birth: 1965-12-03 Referring Provider: Karlton Lemon, MD  Encounter Date: 12/22/2015      PT End of Session - 12/22/15 0839    Visit Number 5   Number of Visits 12   Date for PT Re-Evaluation 01/04/16   PT Start Time 0808  pt arrived late   PT Stop Time 0839   PT Time Calculation (min) 31 min   Activity Tolerance Patient tolerated treatment well   Behavior During Therapy [facility] for tasks assessed/performed      Past Medical History  Diagnosis Date  . Diabetes mellitus without complication [facility])     Past Surgical History  Procedure Laterality Date  . Cesarean section      There were no vitals filed for this visit.      Subjective Assessment - 12/22/15 0810    Subjective Did something last night that may have aggravated arm but pain didn't linger.   Patient Stated Goals "Figure out while my arm is hurting"   Currently in Pain? No/denies               TherEx UBE - lvl 2.5 Fwd/Back x 2 min each  Hooklying on 1/2 foam roll:  Anterior chest/pec stretch x 2'  B Shoulder Horiz ABD + scapular retraction with green TB 2 x10  B Shoulder ER with green TB 2 x10  B Alternating Shoulder D1/D2 with green TB 2x10  B Flexion pullover with 6# 2x10  R Shoulder Protraction/Serratus Punch 6# 2x10  Prone: I's, T's, and Y's 2# 2x10  BATCA Low Row 25# 2x10, both grips   Reviewed doorway stretch and cross chest stretch with pt.  Pt performed 2x20 sec each.                    PT Short Term Goals - 12/06/15 0914    PT SHORT TERM GOAL #1   Title Pt will be independent with initial HEP by 12/07/15   Status Achieved   PT SHORT TERM GOAL #2   Title Pt will demonstrate improved awareness of neutral shoulder posture  by 12/07/15   Status Achieved           PT Long Term Goals - 12/13/15 0937    PT LONG TERM GOAL #1   Title Pt will be independent with advanced HEP by 01/04/16   Status On-going   PT LONG TERM GOAL #2   Title Pt will demonstrate R shoulder ROM within 10 degrees of L without increased pain by 01/04/16   Status Partially Met  Met for flexion and IR   PT LONG TERM GOAL #3   Title Pt will demonstrate R shoulder strength 4/5 or greater by 01/04/16   Status Partially Met  Met for flexion and abduction   PT LONG TERM GOAL #4   Title Pt will report ability to complete ADL's including bathing and dressing without increased R shoudler pain by 01/04/16   Status Partially Met   PT LONG TERM GOAL #5   Title Pt will report ability to complete household chores including reaching and lifting without increased R shoudler pain by 01/04/16   Status Partially Met               Plan - 12/22/15 9373  Clinical Impression Statement Pt without any pain before, during or after session.  Good progress with PT.  MD recommends PT x 2 additional weeks then d/c.   PT Next Visit Plan Postural training; Shoulder/scapular ROM & strengthening; Manual therapy for STM and ROM; Modalities PRN    Consulted and Agree with Plan of Care Patient      Patient will benefit from skilled therapeutic intervention in order to improve the following deficits and impairments:     Visit Diagnosis: Abnormal posture  Other symptoms and signs involving the musculoskeletal system  Pain in right shoulder     Problem List Patient Active Problem List   Diagnosis Date Noted  . Right shoulder pain 11/08/2015  . Generalized anxiety disorder 03/30/2015  . Memory difficulties 09/01/2014  . PCOS (polycystic ovarian syndrome) 06/25/2012  . IBS (irritable bowel syndrome) 12/29/2010  . Essential hypertension, benign 07/07/2009  . OBESITY 04/13/2009  . Diabetes mellitus without complication ([place]) 18/28/8337  . Attention deficit  disorder 10/31/2006   Laureen Abrahams, PT, DPT 12/22/2015 8:41 AM  [facility] [address]  [place], [place], [address] Phone: [phone]   Fax:  [phone]  Name: Ndidi Nesby MRN: [id number] Date of Birth: 10/13/1965

## 2015-12-26 ENCOUNTER — Other Ambulatory Visit: Payer: Self-pay | Admitting: *Deleted

## 2015-12-27 ENCOUNTER — Ambulatory Visit: Payer: Self-pay | Admitting: Physical Therapy

## 2015-12-27 DIAGNOSIS — R29898 Other symptoms and signs involving the musculoskeletal system: Secondary | ICD-10-CM

## 2015-12-27 DIAGNOSIS — M25511 Pain in right shoulder: Secondary | ICD-10-CM

## 2015-12-27 DIAGNOSIS — R293 Abnormal posture: Secondary | ICD-10-CM

## 2015-12-27 MED ORDER — LIRAGLUTIDE 18 MG/3ML ~~LOC~~ SOPN: 1.8000 mg | PEN_INJECTOR | Freq: Every day | SUBCUTANEOUS | Status: DC

## 2015-12-27 NOTE — Therapy (Addendum)
[facility] [address]  [place], [place], [address] Phone: [phone]   Fax:  [phone]  Physical Therapy Treatment  Patient Details  Name: Tracy Weiss MRN: [id number] Date of Birth: 31-Aug-1966 Referring Provider: Karlton Lemon, MD  Encounter Date: 12/27/2015      PT End of Session - 12/27/15 0905    Visit Number 6   Number of Visits 12   Date for PT Re-Evaluation 01/04/16   PT Start Time 0900  Pt arrive late   PT Stop Time 0930   PT Time Calculation (min) 30 min   Activity Tolerance Patient tolerated treatment well   Behavior During Therapy [facility] for tasks assessed/performed      Past Medical History  Diagnosis Date  . Diabetes mellitus without complication [facility])     Past Surgical History  Procedure Laterality Date  . Cesarean section      There were no vitals filed for this visit.      Subjective Assessment - 12/27/15 0903    Subjective Pt denies pain this morning and states even able to sleep on her right side last night. Wants to focus on ROM for reaching behind wih right shoudler.   Currently in Pain? No/denies          TherEx UBE - lvl 2.5 Fwd/Back x 90" each  Doorway stretch - low/mid/high x20" each  Standing against 6" foam roll on wall:  B Shoulder Horiz ABD + scapular retraction with green TB 2x10  B Shoulder ER with green TB 2x10  B Alternating Shoulder D1/D2 with green TB 2x10   Prone over orange (55 cm) Pball:   I's, T's, W's and Y's 2# 2x10  Standing:   Scapular retraction + Shoulder Extension to neutral with green TB x10   Shoulder Low Row with green TB x10   Shoulder Mid Row with green TB x10         PT Education - 12/27/15 1050    Education provided Yes   Education Details Prone I's, T's, W's & Y's over physioball   Person(s) Educated Patient   Methods Explanation;Demonstration;Handout   Comprehension Verbalized understanding;Returned demonstration           PT Short Term Goals - 12/06/15 0914    PT SHORT TERM GOAL #1   Title Pt will be independent with initial HEP by 12/07/15   Status Achieved   PT SHORT TERM GOAL #2   Title Pt will demonstrate improved awareness of neutral shoulder posture by 12/07/15   Status Achieved           PT Long Term Goals - 12/13/15 0937    PT LONG TERM GOAL #1   Title Pt will be independent with advanced HEP by 01/04/16   Status On-going   PT LONG TERM GOAL #2   Title Pt will demonstrate R shoulder ROM within 10 degrees of L without increased pain by 01/04/16   Status Partially Met  Met for flexion and IR   PT LONG TERM GOAL #3   Title Pt will demonstrate R shoulder strength 4/5 or greater by 01/04/16   Status Partially Met  Met for flexion and abduction   PT LONG TERM GOAL #4   Title Pt will report ability to complete ADL's including bathing and dressing without increased R shoudler pain by 01/04/16   Status Partially Met   PT LONG TERM GOAL #5   Title Pt will report ability to complete  household chores including reaching and lifting without increased R shoudler pain by 01/04/16   Status Partially Met               Plan - 12/27/15 0932    Clinical Impression Statement Treatment time limited due to pt arrived 15 minutes late. Focused on stretching and continued posterior strengthening to improve functional reach behind with HEP updated to include I's, T's, W's & Y's over physioball. Next visit anticipated to be final visit per MD recommendation, therefore will check ROM and strength along with goal status and address any patient concerns regarding continued HEP progression and transitiont o gym program.   Rehab Potential Good   PT Treatment/Interventions Patient/family education;Electrical Stimulation;Moist Heat;Iontophoresis 57m/ml Dexamethasone;Cryotherapy;Vasopneumatic Device;Therapeutic exercise;Neuromuscular re-education;ADLs/Self Care Home Management;Manual techniques;Taping;Dry  needling;Passive range of motion   PT Next Visit Plan ROM & MMT, Goal assessment, Review of HEP including progression to gym program, Discharge   Consulted and Agree with Plan of Care Patient      Patient will benefit from skilled therapeutic intervention in order to improve the following deficits and impairments:  Pain, Postural dysfunction, Decreased range of motion, Decreased strength, Increased muscle spasms, Impaired flexibility, Impaired UE functional use  Visit Diagnosis: Abnormal posture  Pain in right shoulder  Other symptoms and signs involving the musculoskeletal system     Problem List Patient Active Problem List   Diagnosis Date Noted  . Right shoulder pain 11/08/2015  . Generalized anxiety disorder 03/30/2015  . Memory difficulties 09/01/2014  . PCOS (polycystic ovarian syndrome) 06/25/2012  . IBS (irritable bowel syndrome) 12/29/2010  . Essential hypertension, benign 07/07/2009  . OBESITY 04/13/2009  . Diabetes mellitus without complication ([facility] 012/87/8676 . Attention deficit disorder 10/31/2006    JPercival Spanish PT, MPT 12/27/2015, 10:52 AM  [facility] [place] [address]Phone: [phone]  Fax:  [phone] Name: Tracy CandlerMRN: [id number] of Birth: [birth year]  PHYSICAL THERAPY DISCHARGE SUMMARY  Visits from Start of Care: 6  Current functional level related to goals / functional outcomes:   Pt had been progressing well with PT with pain resolved and pt reporting able to perform normal daily activities w/o limitations. Pt cancelled final which was anticipated to be final visit per MD recommendation, which was planned include check of ROM and strength along with goal status and address any patient concerns regarding continued HEP progression and transitiont o gym program.Unable to complete these assessments due to pt failure to return for final  visit.  Remaining deficits:   Unable to formally assess secondary to failure to return.   Education / Equipment:   HEP  Plan: Patient agrees to discharge.  Patient goals were partially met. Patient is being discharged due to not returning since the last visit.  ?????       JPercival Spanish PT, MPT 02/07/2016, 10:43 AM  [facility] [place] [address]Phone: [phone]  Fax:  [phone]

## 2015-12-29 ENCOUNTER — Ambulatory Visit: Payer: Self-pay

## 2016-02-14 ENCOUNTER — Ambulatory Visit: Payer: Self-pay

## 2016-02-14 ENCOUNTER — Encounter: Payer: Self-pay | Admitting: Family Medicine

## 2016-02-14 ENCOUNTER — Ambulatory Visit (INDEPENDENT_AMBULATORY_CARE_PROVIDER_SITE_OTHER): Payer: Self-pay | Admitting: Family Medicine

## 2016-02-14 VITALS — BP 123/79 | HR 86 | Temp 98.6°F | Ht 61.0 in | Wt 170.0 lb

## 2016-02-14 DIAGNOSIS — Z111 Encounter for screening for respiratory tuberculosis: Secondary | ICD-10-CM

## 2016-02-14 DIAGNOSIS — E119 Type 2 diabetes mellitus without complications: Secondary | ICD-10-CM

## 2016-02-14 DIAGNOSIS — Z794 Long term (current) use of insulin: Secondary | ICD-10-CM

## 2016-02-14 DIAGNOSIS — Z0000 Encounter for general adult medical examination without abnormal findings: Secondary | ICD-10-CM

## 2016-02-14 DIAGNOSIS — F411 Generalized anxiety disorder: Secondary | ICD-10-CM

## 2016-02-14 DIAGNOSIS — Z9851 Tubal ligation status: Secondary | ICD-10-CM

## 2016-02-14 LAB — CBC
HCT: 34.9 % — ABNORMAL LOW (ref 35.0–45.0)
Hemoglobin: 11 g/dL — ABNORMAL LOW (ref 11.7–15.5)
MCH: 28.1 pg (ref 27.0–33.0)
MCHC: 31.5 g/dL — ABNORMAL LOW (ref 32.0–36.0)
MCV: 89.3 fL (ref 80.0–100.0)
MPV: 10 fL (ref 7.5–12.5)
Platelets: 384 10*3/uL (ref 140–400)
RBC: 3.91 MIL/uL (ref 3.80–5.10)
RDW: 14.3 % (ref 11.0–15.0)
WBC: 6.7 10*3/uL (ref 3.8–10.8)

## 2016-02-14 LAB — LIPID PANEL
Cholesterol: 154 mg/dL (ref 125–200)
HDL: 79 mg/dL (ref >=46)
LDL Cholesterol: 58 mg/dL (ref <130)
Total CHOL/HDL Ratio: 1.9 Ratio (ref <=5.0)
Triglycerides: 84 mg/dL (ref <150)
VLDL: 17 mg/dL (ref <30)

## 2016-02-14 LAB — POCT GLYCOSYLATED HEMOGLOBIN (HGB A1C): Hemoglobin A1C: 7.3

## 2016-02-14 MED ORDER — ALPRAZOLAM 0.25 MG PO TABS: 0.2500 mg | ORAL_TABLET | Freq: Two times a day (BID) | ORAL | Status: DC | PRN

## 2016-02-14 NOTE — Patient Instructions (Signed)
Thank you so much for coming to visit today. We will obtain a CBC as requested and check your cholesterol today. We will also check your A1C. Unfortunately, Dr. Kennon Rounds does not see OB patients at this facility and only does family medicine here. I will place a referral to OB/Gyn and our referral coordinator will help you establish with a facility. Your doing a great job on your Diet and Exercise! Keep up the good work!  Thanks again! Dr. Gerlean Ren

## 2016-02-15 DIAGNOSIS — Z0000 Encounter for general adult medical examination without abnormal findings: Secondary | ICD-10-CM | POA: Insufficient documentation

## 2016-02-15 NOTE — Assessment & Plan Note (Signed)
-   Stable - Xanax refilled

## 2016-02-15 NOTE — Assessment & Plan Note (Signed)
-   Will check A1C today - Continue current regimen of Novolog 10units TID, Lantus 15units daily

## 2016-02-15 NOTE — Assessment & Plan Note (Signed)
-   Next due for Pap Smear 2020. Mammogram scheduled for 03/2016. Consider colonoscopy at age 50. - Will obtain CBC and Lipid Panel. To fax CBC results to Dr. Rhona Raider at [phone]

## 2016-02-15 NOTE — Progress Notes (Signed)
Subjective:     Patient ID: Tracy Weiss, female   DOB: 1966-06-26, 50 y.o.   MRN: [id number]  HPI Tracy Weiss is a 50yo female presenting for lab work due to scheduled surgery and new job. 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 Rhona Raider. Dr. Rhona Raider has requested a CBC without differential be completed by our office. States she is unsure if she wants more children, but she feels having a tubal ligation is a deterrent to males in her dating life. -  Requests local Ob/Gyn, however she would like to proceed with surgery scheduled with Dr. Rhona Raider.  # Diabetes: - Last A1C 7.4 in 06/2015 - Currently prescribed Novolog 10unit TID and Lantus 15units. States she only takes the Lantus as needed. Self-Discontinued Liraglutide. - Currently prescribed Benicar for kidney protection  # Generalized Anxiety Disorder: - Previously seen by Dr. Valentina Shaggy in 03/2015 for Neuropsychological Evaluation. Please refer to note from 03/23/16 for summary of Dr. Monico Hoar faxed report. Recommended low dose Xanax and continuing Methylphenidate for Adult ADHD.  - Xanax 0.25mg  #20 filled on 03/24/15 with two refills. Requests refill today. - Reports anxiety is stable. Only takes Xanax occasionally as needed.  # Health Maintenance: - Last pap smear without signs of malignancy and negative HPV in 08/2014. Next due in 2020 - Not due for colonoscopy - Next Mammogram Scheduled for 03/2016 - Medications reviewed and updated - Due for Cholesterol Check, last done in 2015  Review of Systems Per HPI. Other Systems negative.    Objective:   Physical Exam  Constitutional: She appears well-developed and well-nourished. No distress.  HENT:  Head: Normocephalic and atraumatic.  Mouth/Throat: No oropharyngeal exudate.  Eyes: Pupils are equal, round, and reactive to light. Right eye exhibits no  discharge. Left eye exhibits no discharge.  Cardiovascular: Normal rate and regular rhythm.  Exam reveals no gallop and no friction rub.   No murmur heard. Pedal pulses palpable bilaterally.  Pulmonary/Chest: Effort normal and breath sounds normal. No respiratory distress. She has no wheezes.  Abdominal: Soft. She exhibits no distension. There is no tenderness.  Musculoskeletal: She exhibits no edema.  Neurological:  Sensation intact over feet bilaterally  Skin:  No ulcers or calluses noted on feet bilaterally.   Psychiatric: She has a normal mood and affect. Her behavior is normal.      Assessment and Plan:     Diabetes mellitus without complication - Will check A1C today - Continue current regimen of Novolog 10units TID, Lantus 15units daily  Generalized anxiety disorder - Stable - Xanax refilled  Health care maintenance - Next due for Pap Smear 2020. Mammogram scheduled for 03/2016. Consider colonoscopy at age 89. - Will obtain CBC and Lipid Panel. To fax CBC results to Dr. Rhona Raider at [phone]

## 2016-02-16 ENCOUNTER — Encounter: Payer: Self-pay | Admitting: Family Medicine

## 2016-02-16 ENCOUNTER — Ambulatory Visit (INDEPENDENT_AMBULATORY_CARE_PROVIDER_SITE_OTHER): Payer: Self-pay | Admitting: *Deleted

## 2016-02-16 DIAGNOSIS — Z7689 Persons encountering health services in other specified circumstances: Secondary | ICD-10-CM

## 2016-02-16 DIAGNOSIS — Z111 Encounter for screening for respiratory tuberculosis: Secondary | ICD-10-CM

## 2016-02-16 LAB — TB SKIN TEST
Induration: 0 mm
TB Skin Test: NEGATIVE

## 2016-02-16 NOTE — Progress Notes (Signed)
   PPD Reading Note PPD read and results entered in Epic Result: 0 mm induration. Interpretation: Negative Allergic reaction: no Letter given with PPD results for work Reviewed lab results from 02/14/16 with patient and copy given to patient. Discussed MyChart with patient for future results Literature on iron-rich foods given to patient Elanie Hammitt, Orvis Brill, RN

## 2016-02-16 NOTE — Patient Instructions (Signed)
Iron-Rich Diet Iron is a mineral that helps your body to produce hemoglobin. Hemoglobin is a protein in your red blood cells that carries oxygen to your body's tissues. Eating too little iron may cause you to feel weak and tired, and it can increase your risk for infection. Eating enough iron is necessary for your body's metabolism, muscle function, and nervous system. Iron is naturally found in many foods. It can also be added to foods or fortified in foods. There are two types of dietary iron:  Heme iron. Heme iron is absorbed by the body more easily than nonheme iron. Heme iron is found in meat, poultry, and fish.  Nonheme iron. Nonheme iron is found in dietary supplements, iron-fortified grains, beans, and vegetables. You may need to follow an iron-rich diet if:  You have been diagnosed with iron deficiency or iron-deficiency anemia.  You have a condition that prevents you from absorbing dietary iron, such as:  Infection in your intestines.  Celiac disease. This involves long-lasting (chronic) inflammation of your intestines.  You do not eat enough iron.  You eat a diet that is high in foods that impair iron absorption.  You have lost a lot of blood.  You have heavy bleeding during your menstrual cycle.  You are pregnant. WHAT IS MY PLAN? Your health care provider may help you to determine how much iron you need per day based on your condition. Generally, when a person consumes sufficient amounts of iron in the diet, the following iron needs are met:  Men.  56-2 years old: 11 mg per day.  12-73 years old: 8 mg per day.  Women.   56-4 years old: 15 mg per day.  35-31 years old: 18 mg per day.  Over 82 years old: 8 mg per day.  Pregnant women: 27 mg per day.  Breastfeeding women: 9 mg per day. WHAT DO I NEED TO KNOW ABOUT AN IRON-RICH DIET?  Eat fresh fruits and vegetables that are high in vitamin C along with foods that are high in iron. This will help increase  the amount of iron that your body absorbs from food, especially with foods containing nonheme iron. Foods that are high in vitamin C include oranges, peppers, tomatoes, and mango.  Take iron supplements only as directed by your health care provider. Overdose of iron can be life-threatening. If you were prescribed iron supplements, take them with orange juice or a vitamin C supplement.  Cook foods in pots and pans that are made from iron.   Eat nonheme iron-containing foods alongside foods that are high in heme iron. This helps to improve your iron absorption.   Certain foods and drinks contain compounds that impair iron absorption. Avoid eating these foods in the same meal as iron-rich foods or with iron supplements. These include:  Coffee, black tea, and red wine.  Milk, dairy products, and foods that are high in calcium.  Beans, soybeans, and peas.  Whole grains.  When eating foods that contain both nonheme iron and compounds that impair iron absorption, follow these tips to absorb iron better.   Soak beans overnight before cooking.  Soak whole grains overnight and drain them before using.  Ferment flours before baking, such as using yeast in bread dough. WHAT FOODS CAN I EAT? Grains Iron-fortified breakfast cereal. Iron-fortified whole-wheat bread. Enriched rice. Sprouted grains. Vegetables Spinach. Potatoes with skin. Green peas. Broccoli. Red and green bell peppers. Fermented vegetables. Fruits Prunes. Raisins. Oranges. Strawberries. Mango. Grapefruit. Meats and Other Protein  Sources Beef liver. Oysters. Beef. Shrimp. [place]. Chicken. [place]. Sardines. Chickpeas. Nuts. Tofu. Beverages Tomato juice. Fresh orange juice. Prune juice. Hibiscus tea. Fortified instant breakfast shakes. Condiments Tahini. Fermented soy sauce. Sweets and Desserts Black-strap molasses.  Other Wheat germ. The items listed above may not be a complete list of recommended foods or  beverages. Contact your dietitian for more options. WHAT FOODS ARE NOT RECOMMENDED? Grains Whole grains. Bran cereal. Bran flour. Oats. Vegetables Artichokes. Brussels sprouts. Kale. Fruits Blueberries. Raspberries. Strawberries. Figs. Meats and Other Protein Sources Soybeans. Products made from soy protein. Dairy Milk. Cream. Cheese. Yogurt. Cottage cheese. Beverages Coffee. Black tea. Red wine. Sweets and Desserts Cocoa. Chocolate. Ice cream. Other Basil. Oregano. Parsley. The items listed above may not be a complete list of foods and beverages to avoid. Contact your dietitian for more information.   This information is not intended to replace advice given to you by your health care provider. Make sure you discuss any questions you have with your health care provider.   Document Released: 04/03/2005 Document Revised: 09/10/2014 Document Reviewed: 03/17/2014 Elsevier Interactive Patient Education [employer].

## 2016-03-14 ENCOUNTER — Telehealth: Payer: Self-pay | Admitting: *Deleted

## 2016-03-14 NOTE — Telephone Encounter (Signed)
Pt states that she recently had her BTL reversed and would like to be referred to an OB/GYN for followup (recommended by the MD who performed surgery.) Delmore Sear, Salome Spotted, CMA

## 2016-03-15 ENCOUNTER — Other Ambulatory Visit: Payer: Self-pay | Admitting: *Deleted

## 2016-03-15 MED ORDER — INSULIN ASPART 100 UNIT/ML ~~LOC~~ SOLN: 10.0000 [IU] | Freq: Three times a day (TID) | SUBCUTANEOUS | Status: DC

## 2016-03-15 MED ORDER — LIRAGLUTIDE 18 MG/3ML ~~LOC~~ SOPN: 1.8000 mg | PEN_INJECTOR | Freq: Every day | SUBCUTANEOUS | Status: DC

## 2016-03-15 MED ORDER — INSULIN GLARGINE 100 UNIT/ML ~~LOC~~ SOLN
15.0000 [IU] | Freq: Every day | SUBCUTANEOUS | Status: DC
Start: 2016-03-15 — End: 2016-03-27

## 2016-03-15 NOTE — Telephone Encounter (Signed)
Referral placed in 02/2016 to [facility].

## 2016-03-16 NOTE — Telephone Encounter (Signed)
Appt in Epic for 04/25/2016 at 8:40 am. Called and LV for pt to call to confirm she has been notified of this appt. Ottis Stain, CMA

## 2016-03-22 ENCOUNTER — Ambulatory Visit (INDEPENDENT_AMBULATORY_CARE_PROVIDER_SITE_OTHER): Payer: Self-pay | Admitting: Family Medicine

## 2016-03-22 ENCOUNTER — Encounter: Payer: Self-pay | Admitting: Family Medicine

## 2016-03-22 VITALS — BP 130/74 | HR 82 | Temp 98.5°F | Ht 61.0 in

## 2016-03-22 DIAGNOSIS — R21 Rash and other nonspecific skin eruption: Secondary | ICD-10-CM

## 2016-03-22 DIAGNOSIS — Z3009 Encounter for other general counseling and advice on contraception: Secondary | ICD-10-CM

## 2016-03-22 MED ORDER — TRIAMCINOLONE ACETONIDE 0.025 % EX OINT: 1.0000 "application " | TOPICAL_OINTMENT | Freq: Two times a day (BID) | CUTANEOUS | Status: DC

## 2016-03-22 NOTE — Patient Instructions (Signed)
Thank you so much for coming to visit today! I have given you a prescription for a steroid cream for you to use twice daily. You may also use Eucerin, Aquaphor, or Vaseline on the area to moisturize it. I have placed a referral to dermatology.  I have arranged an office visit with Dr. Lincoln Brigham to discuss Clomid.  Thanks again! Dr. Gerlean Ren

## 2016-03-23 ENCOUNTER — Other Ambulatory Visit: Payer: Self-pay | Admitting: Family Medicine

## 2016-03-23 NOTE — Telephone Encounter (Signed)
Pt stated she is out of Novolog and is not able to get it filled due to insurance. She would like to pick up a bottle asap. Pt asked to call her as soon as it was ready. [phone] ep

## 2016-03-25 DIAGNOSIS — Z3009 Encounter for other general counseling and advice on contraception: Secondary | ICD-10-CM | POA: Insufficient documentation

## 2016-03-25 NOTE — Progress Notes (Signed)
Subjective:     Patient ID: Tracy Weiss, female   DOB: March 10, 1966, 50 y.o.   MRN: [id number]  HPI [name] is a 50yo female presenting with rash on left hand. - Has noted rash on left hand for several months - Itches - Has tried many over the counter creams, including Hydrocortisone. Has tried bleaching, which only seemed to make the rash worse - Requests referral back to Dermatology. Previously followed by Dr. Allyson Sabal and she would like to follow up with them again. - Denies rashes elsewhere, fever - Denies any new soaps, lotions, detergents  - Also of note, recently had bilateral tubal ligation reversal because she would like to have another baby. Has follow up with Ob/Gyn on 8/23. Would like to discuss initiation of Clomid, but not willing to wait until Ob/Gyn follow up. Would like to schedule appointment with another physician here to discuss this medication.  Review of Systems Per HPI. Other systems negative.    Objective:   Physical Exam  Constitutional: She appears well-developed and well-nourished. No distress.  Cardiovascular: Normal rate and regular rhythm.   Pulmonary/Chest: Effort normal. No respiratory distress. She has no wheezes.  Skin:  Mild erythematous dry patch noted over dorsilateral surface of left hand and wrist; mild hyperpigmentation.      Assessment and Plan:     1. Rash and nonspecific skin eruption - Suspect allergic dermatitis - Triamcinolone prescribed - Referral back to Dr. Allyson Sabal, dermatology  2. Family planning counseling - Pregnancy would be high risk given age.  - Recent BTL reversal - Follow up with Ob/Gyn 8/23 - Appointment scheduled with Dr. Lincoln Brigham to discuss Clomid.

## 2016-03-26 MED ORDER — INSULIN ASPART 100 UNIT/ML ~~LOC~~ SOLN: 10.0000 [IU] | Freq: Three times a day (TID) | SUBCUTANEOUS | 11 refills | Status: DC

## 2016-03-26 NOTE — Telephone Encounter (Addendum)
Spoke to pt. She can't afford meds. She asked if we have the pens. Dr. Gerlean Ren said as long as she has used the pen she can have them. If she hasn't, she can only get them if she meets with [facility]. Tired to call pt. Ottis Stain, CMA\

## 2016-03-26 NOTE — Telephone Encounter (Signed)
No Novolog bottles available in clinic for pickup. Refill faxed to pharmacy.

## 2016-03-27 ENCOUNTER — Telehealth: Payer: Self-pay | Admitting: *Deleted

## 2016-03-27 MED ORDER — INSULIN GLARGINE 100 UNIT/ML ~~LOC~~ SOLN: 15.0000 [IU] | Freq: Every day | SUBCUTANEOUS | 12 refills | Status: DC

## 2016-03-27 MED ORDER — LIRAGLUTIDE 18 MG/3ML ~~LOC~~ SOPN: 1.8000 mg | PEN_INJECTOR | Freq: Every day | SUBCUTANEOUS | 3 refills | Status: DC

## 2016-03-27 MED ORDER — INSULIN ASPART 100 UNIT/ML FLEXPEN: 15.0000 [IU] | PEN_INJECTOR | Freq: Three times a day (TID) | SUBCUTANEOUS | 11 refills | Status: DC

## 2016-03-27 NOTE — Telephone Encounter (Signed)
Received another refill request form [facility] for Lantus and Victoza.  They did not received request from 03/15/16.  Rx faxed to [facility].  Derl Barrow, RN

## 2016-03-27 NOTE — Addendum Note (Signed)
Addended by: Derl Barrow on: 03/27/2016 04:44 PM   Modules accepted: Orders

## 2016-03-29 ENCOUNTER — Encounter: Payer: Self-pay | Admitting: *Deleted

## 2016-03-29 ENCOUNTER — Other Ambulatory Visit: Payer: Self-pay | Admitting: Family Medicine

## 2016-03-29 ENCOUNTER — Ambulatory Visit
Admission: RE | Admit: 2016-03-29 | Discharge: 2016-03-29 | Disposition: A | Discharge status: Home / Self Care | Payer: No Typology Code available for payment source | Source: Ambulatory Visit

## 2016-03-29 ENCOUNTER — Telehealth: Payer: Self-pay | Admitting: *Deleted

## 2016-03-29 DIAGNOSIS — Z1231 Encounter for screening mammogram for malignant neoplasm of breast: Secondary | ICD-10-CM

## 2016-03-29 MED ORDER — INSULIN ASPART 100 UNIT/ML FLEXPEN: 15.0000 [IU] | PEN_INJECTOR | Freq: Three times a day (TID) | SUBCUTANEOUS | 0 refills | Status: DC

## 2016-03-29 NOTE — Telephone Encounter (Signed)
Patient called stating she is out of her Lantus, Novolog and test strips.  Patient requested a month supply. She called [facility], but they don't have her medications ready and this time and her orange card is expired.  She is waiting for an appointment to reapply for the orange card.  Patient uses Presto Test strips, which [facility] does not have.  Pt has to contact health department.  Precept with Dr. Valentina Lucks, wanted to schedule pt for today at 2:30 PM with him to discuss possible alternative to Lantus and give her sample of Novolog.  Will not be able to give patient a month supply of any insulin.  Patient stated she has upcoming appointment on 04/02/16 at 11 AM and appointment on 04/10/16 for orange card.  Patient stated she can not come in today for appointment.  Dr. Valentina Lucks stated she can get two Novolog Pen samples and keep appointment on 04/02/16.  Pt informed that samples of Novolog are ready for pick up.  Derl Barrow, RN

## 2016-03-29 NOTE — Telephone Encounter (Signed)
See 03/29/16 phone note. Ottis Stain, CMA

## 2016-04-02 ENCOUNTER — Ambulatory Visit (INDEPENDENT_AMBULATORY_CARE_PROVIDER_SITE_OTHER): Payer: Self-pay | Admitting: Student

## 2016-04-02 ENCOUNTER — Ambulatory Visit
Admission: RE | Admit: 2016-04-02 | Discharge: 2016-04-02 | Disposition: A | Discharge status: Home / Self Care | Payer: No Typology Code available for payment source | Source: Ambulatory Visit | Attending: Family Medicine | Admitting: Family Medicine

## 2016-04-02 DIAGNOSIS — Z9889 Other specified postprocedural states: Secondary | ICD-10-CM

## 2016-04-02 DIAGNOSIS — Z3009 Encounter for other general counseling and advice on contraception: Secondary | ICD-10-CM

## 2016-04-02 DIAGNOSIS — Z1231 Encounter for screening mammogram for malignant neoplasm of breast: Secondary | ICD-10-CM

## 2016-04-02 DIAGNOSIS — Z319 Encounter for procreative management, unspecified: Secondary | ICD-10-CM

## 2016-04-02 NOTE — Patient Instructions (Addendum)
Follow with PCP as needed We discussed your pregnancy desires You had a Tubal Ligation Reversal which you are doing great after!! We discussed some fertility labs: AMH ( Anti mullerian hormone) to assess ovarian reserve as well as labs to be collected on day 3 of your cycle These will be done by your OB/Gyn. In future, you may need to be assess by a specialist in Reproductive Endocrinology and Infertility We also discussed Clomid, which can increase ovulation and may increase your risk for multiple gestation ( Twins, triplets etc) If you have any questions or concerns, call the office at [phone]

## 2016-04-03 ENCOUNTER — Encounter (HOSPITAL_COMMUNITY): Payer: Self-pay | Admitting: Gynecology

## 2016-04-03 ENCOUNTER — Inpatient Hospital Stay (HOSPITAL_COMMUNITY)
Admission: AD | Admit: 2016-04-03 | Discharge: 2016-04-03 | Disposition: A | Discharge status: Home / Self Care | Payer: Self-pay | Source: Ambulatory Visit | Attending: Obstetrics & Gynecology | Admitting: Obstetrics & Gynecology

## 2016-04-03 ENCOUNTER — Inpatient Hospital Stay (HOSPITAL_COMMUNITY): Payer: Self-pay

## 2016-04-03 DIAGNOSIS — R109 Unspecified abdominal pain: Secondary | ICD-10-CM | POA: Insufficient documentation

## 2016-04-03 DIAGNOSIS — R102 Pelvic and perineal pain: Secondary | ICD-10-CM

## 2016-04-03 DIAGNOSIS — Z319 Encounter for procreative management, unspecified: Secondary | ICD-10-CM | POA: Insufficient documentation

## 2016-04-03 DIAGNOSIS — Z9889 Other specified postprocedural states: Secondary | ICD-10-CM | POA: Insufficient documentation

## 2016-04-03 DIAGNOSIS — E119 Type 2 diabetes mellitus without complications: Secondary | ICD-10-CM | POA: Insufficient documentation

## 2016-04-03 DIAGNOSIS — Z794 Long term (current) use of insulin: Secondary | ICD-10-CM | POA: Insufficient documentation

## 2016-04-03 DIAGNOSIS — Z87891 Personal history of nicotine dependence: Secondary | ICD-10-CM | POA: Insufficient documentation

## 2016-04-03 HISTORY — DX: Other specified postprocedural states: Z98.890

## 2016-04-03 LAB — URINE MICROSCOPIC-ADD ON

## 2016-04-03 LAB — CBC
HCT: 33.5 % — ABNORMAL LOW (ref 36.0–46.0)
Hemoglobin: 11.1 g/dL — ABNORMAL LOW (ref 12.0–15.0)
MCH: 28.9 pg (ref 26.0–34.0)
MCHC: 33.1 g/dL (ref 30.0–36.0)
MCV: 87.2 fL (ref 78.0–100.0)
Platelets: 295 10*3/uL (ref 150–400)
RBC: 3.84 MIL/uL — ABNORMAL LOW (ref 3.87–5.11)
RDW: 13.7 % (ref 11.5–15.5)
WBC: 5.3 10*3/uL (ref 4.0–10.5)

## 2016-04-03 LAB — URINALYSIS, ROUTINE W REFLEX MICROSCOPIC
Bilirubin Urine: NEGATIVE
Glucose, UA: >1000 mg/dL — AB
Ketones, ur: NEGATIVE mg/dL
Leukocytes, UA: NEGATIVE
Nitrite: NEGATIVE
Protein, ur: NEGATIVE mg/dL
Specific Gravity, Urine: 1.015 (ref 1.005–1.030)
pH: 6 (ref 5.0–8.0)

## 2016-04-03 LAB — POCT PREGNANCY, URINE: Preg Test, Ur: NEGATIVE

## 2016-04-03 NOTE — Discharge Instructions (Signed)
Infertility Infertility is when you are unable to get pregnant (conceive) after a year of having sex regularly without using birth control. Infertility can also mean that a woman is not able to carry a pregnancy to full term.  Both women and men can have fertility problems. WHAT CAUSES INFERTILITY? What Causes Infertility in Women? There are many possible causes of infertility in women. For some women, the cause of infertility is not known (unexplained infertility). Infertility can also be linked to more than one cause. Infertility problems in women can be caused by problems with the menstrual cycle or reproductive organs, certain medical conditions, and factors related to lifestyle and age.  Problems with your menstrual cycle can interfere with your ovaries producing eggs (ovulation). This can make it difficult to get pregnant. This includes having a menstrual cycle that is very long, very short, or irregular.  Problems with reproductive organs can include:  An abnormally narrow cervix or a cervix that does not remain closed during a pregnancy.  A blockage in your fallopian tubes.  An abnormally shaped uterus.  Uterine fibroids. This is a tissue mass (tumor) that can develop on your uterus.  Medical conditions that can affect a woman's fertility include:  Polycystic ovarian syndrome (PCOS). This is a hormonal disorder that can cause small cysts to grow on your ovaries. This is the most common cause of infertility in women.  Endometriosis. This is a condition in which the tissue that lines your uterus (endometrium) grows outside of its normal location.  Primary ovary insufficiency. This is when your ovaries stop producing eggs and hormones before the age of 72.  Sexually transmitted diseases, such as chlamydia or gonorrhea. These infections can cause scarring in your fallopian tubes. This makes it difficult for eggs to reach your uterus.  Autoimmune disorders. These are disorders in  which your immune system attacks normal, healthy cells.  Hormone imbalances.  Other factors include:  Age. A woman's fertility declines with age, especially after her mid-68s.  Being under- or overweight.  Drinking too much alcohol.  Using drugs.  Exercising excessively.  Being exposed to environmental toxins, such as radiation, pesticides, and certain chemicals. What Causes Infertility in Men? There are many causes of infertility in men. Infertility can be linked to more than one cause. Infertility problems in men can be caused by problems with sperm or the reproductive organs, certain medical conditions, and factors related to lifestyle and age. Some men have unexplained infertility.   Problems with sperm. Infertility can result if there is a problem producing:  Enough sperm (low sperm count).  Enough normally-shaped sperm (sperm morphology).  Sperm that are able to reach the egg (poor motility).  Infertility can also be caused by:  A problem with hormones.  Enlarged veins (varicoceles), cysts (spermatoceles), or tumors of the testicles.  Sexual dysfunction.  Injury to the testicles.  A birth defect, such as not having the tubes that carry sperm (vas deferens).  Medical conditions that can affect a man's fertility include:  Diabetes.  Cancer treatments, such as chemotherapy or radiation.  Klinefelter syndrome. This is an inherited genetic disorder.   Thyroid problems, such as an under- or overactive thyroid.  Cystic fibrosis.  Sexually transmitted diseases.  Other factors include:  Age. A man's fertility declines with age.  Drinking too much alcohol.  Using drugs.  Being exposed to environmental toxins, such as pesticides and lead. WHAT ARE THE SYMPTOMS OF INFERTILITY? Being unable to get pregnant after one year of having  regular sex without using birth control is the only sign of infertility.  HOW IS INFERTILITY DIAGNOSED? In order to be diagnosed  with infertility, both partners will have a physical exam. Both partners will also have an extensive medical and sexual history taken. If there is no obvious reason for infertility, additional tests may be done. What Tests Will Women Have? Women may first have tests to check whether they are ovulating each month. The tests may include:  Blood tests to check hormone levels.  An ultrasound of the ovaries. This looks for possible problems on or in the ovaries.  Taking a small sample of the tissue that lines the uterus for examination under a microscope (endometrial biopsy). Women who are ovulating may have additional tests. These may include:  Hysterosalpingography.  This is an X-ray of the fallopian tubes and uterus taken after a specific type of dye is injected.  This test can show the shape of the uterus and whether the fallopian tubes are open.  Laparoscopy.  In this test, a lighted tube (laparoscope) is used to look for problems in the fallopian tubes and other female organs.  Transvaginal ultrasound.  This is an imaging test to check for abnormalities of the uterus and ovaries.  A health care provider can use this test to count the number of follicles on the ovaries.  Hysteroscopy.  This test involves using a lighted tube to examine the cervix and inside the uterus.  It is done to find any abnormalities inside the uterus. What Tests Will Men Have? Tests for men's infertility includes:  Semen tests to check sperm count, morphology, and motility.  Blood tests to check for hormone levels.  Taking a small sample of tissue from inside a testicle (biopsy). This is examined under a microscope.  Blood tests to check for genetic abnormalities (genetic testing). HOW ARE WOMEN TREATED FOR INFERTILITY?  Treatment depends on the cause of infertility. Most cases of infertility in women are treated with medicine or surgery.  Women may take medicine to:  Correct ovulation  problems.  Treat other health conditions, such as PCOS.  Surgery may be done to:  Repair damage to the ovaries, fallopian tubes, cervix, or uterus.  Remove growths from the uterus.  Remove scar tissue from the uterus, pelvis, or other female organs. HOW ARE MEN TREATED FOR INFERTILITY?  Treatment depends on the cause of infertility. Most cases of infertility in men are treated with medicine or surgery.   Men may take medicine to:  Correct hormone problems.  Treat other health conditions.  Treat sexual dysfunction.  Surgery may be done to:  Remove blockages in the reproductive tract.  Correct other structural problems of the reproductive tract. WHAT IS ASSISTED REPRODUCTIVE TECHNOLOGY? Assisted reproductive technology (ART) refers to all treatments and procedures that combine eggs and sperm outside the body to try to help a couple conceive. ART is often combined with fertility drugs to stimulate ovulation. Sometimes ART is done using eggs retrieved from another woman's body (donor eggs) or from previously frozen fertilized eggs (embryos).  There are different types of ART. These include:   Intrauterine insemination (IUI).  In this procedure, sperm is placed directly into a woman's uterus with a long, thin tube.  This may be most effective for infertility caused by sperm problems, including low sperm count and low motility.  Can be used in combination with fertility drugs.  In vitro fertilization (IVF).  This is often done when a woman's fallopian tubes are blocked  or when a man has low sperm counts.  Fertility drugs stimulate the ovaries to produce multiple eggs. Once mature, these eggs are removed from the body and combined with the sperm to be fertilized.  These fertilized eggs are then placed in the woman's uterus.   This information is not intended to replace advice given to you by your health care provider. Make sure you discuss any questions you have with your  health care provider.   Document Released: 08/23/2003 Document Revised: 05/11/2015 Document Reviewed: 05/05/2014 Elsevier Interactive Patient Education [employer].

## 2016-04-03 NOTE — Assessment & Plan Note (Signed)
Healing well from the procedure. No current issues - addressed her increased risk of ectopic pregnancy with her history of BTL reversal and she expressed her understanding and acceptance as well as desire to proceed with pregnancy

## 2016-04-03 NOTE — Progress Notes (Signed)
   Subjective:    Patient ID: Tracy Weiss, female    DOB: 1966/06/28, 50 y.o.   MRN: [id number]   CC: BTL reveral f/u, desire for pregnacy  HPI: 50 y/o F presenting for BTL reveral f/u, desire for pregnacy  BTL reversal - performed on 03/09/2016 in [place] - denies any pain, N/V, has been having normal bowel movements, denies fever or drainage from wound - reports entry was through her cesarean section scar  Desire for pregnancy - has been with a new partner who she stongly desires tohave children with - she has 2 children, twins who are 37 years old - she hs regular menses, Patient's last menstrual period was 03/27/2016 (approximate).  - she is sexually active with and is trying to get pregnancy as soon as possible - she is interested in trying an ovulation induction agent like Clomid to help - her pregnancy history is as follows G1- twin cesarean delivery G2- twin SAB G3- twin TAB - also significantly she has diabetes and HTN  Smoking status reviewed - does not smoke  Review of Systems  Per HPI, else denies recent illness, fever, headache, changes in vision, chest pain, shortness of breath, abdominal pain, N/V/D, weakness    Objective:  BP 129/85 (BP Location: Left Arm, Patient Position: Sitting, Cuff Size: Normal)   Pulse 76   Temp 98.4 F (36.9 C) (Oral)   Ht 5\' 1"  (1.549 m)   Wt 171 lb 6.4 oz (77.7 kg)   LMP 03/27/2016 (Approximate)   SpO2 98%   BMI 32.39 kg/m  Vitals and nursing note reviewed  General: NAD Cardiac: RRR Respiratory: CTAB, normal effort Abdomen: soft, nontender, nondistended,Bowel sounds present. Pffanensteil incision clean, dry intact, non tender, non erythematous Extremities: no edema or cyanosis. WWP. Skin: warm and dry, no rashes noted Neuro: alert and oriented,   Assessment & Plan:    History of reversal of tubal ligation Healing well from the procedure. No current issues - addressed her increased risk of ectopic pregnancy with her  history of BTL reversal and she expressed her understanding and acceptance as well as desire to proceed with pregnancy   Family planning counseling Strong desire for pregnancy despite multiple risk to both her and any potential child. Discussed risk of genetic anomalies given her age, and given her history of twin pregnancies the risk a twin pregnancy would place on her, increased risk given her HTN and DM. Additionally discussed risk of preeclampsia associated with HTN and age as well increased risk of neural tube defects with diabetes. Last A1c on 6/13 was 7.3.  Despite this discussion she still desires pregnancy. She has an OB/Gyn appt in the coming weeks during which this will be discussed further.  We also discussed that this clinic will be unable to give her Clomid or any ovulation induction agent give the risks and need for very close monitoring should she receive this She expressed her understanding and acceptance of this    Gearldean Lomanto A. Lincoln Brigham MD, [facility] Resident PGY-3 Pager [phone]

## 2016-04-03 NOTE — MAU Provider Note (Signed)
History     CSN: [id number]  Arrival date and time: 04/03/16 1831   None     Chief Complaint  Patient presents with  . Abdominal Pain   HPI   Pt is a 50 yo female who presents for 4 day history of abdominal pain.  Pain is sharp and deep.  It starts around recent laparotomy scar and rises upwards.  She doesn't think it is the incision.  Pain went away on Sunday but returned today.  Pt denies N/V/D/ or constipation.  Pt had laparotomy for tubal reversal in [place].  She has not had follow care since but has talked with MD over the phone.  Pt denies fever.    RN note: [] Hover for attribution information Pt C/O lower abd pain that started over the weekend, is intermittent, had a tubal reversal a month ago.  Denies vaginal bleedin Past Medical History:  Diagnosis Date  . Diabetes mellitus without complication [facility])     Past Surgical History:  Procedure Laterality Date  . CESAREAN SECTION      Family History  Problem Relation Age of Onset  . Cancer Mother     Metastatic with Unknown Primary; Stomach was involved  . Diabetes Mother   . Diabetes Father   . Heart disease Father   . Diabetes Maternal Grandmother   . Diabetes Maternal Grandfather   . Diabetes Paternal Grandmother   . Diabetes Paternal Grandfather     Social History  Substance Use Topics  . Smoking status: Former Smoker    Packs/day: 0.50    Years: 10.00    Types: Cigarettes    Start date: 09/03/1982    Quit date: 09/04/1983  . Smokeless tobacco: Never Used  . Alcohol use No    Allergies: No Known Allergies  Prescriptions Prior to Admission  Medication Sig Dispense Refill Last Dose  . ALPRAZolam (XANAX) 0.25 MG tablet Take 1 tablet (0.25 mg total) by mouth 2 (two) times daily as needed for anxiety. 20 tablet 2   . Cetirizine HCl (ZYRTEC ALLERGY) 10 MG CAPS Take 1 capsule (10 mg total) by mouth every morning. (Patient not taking: Reported on 11/23/2015) 30 capsule 0 Not Taking  . fluconazole (DIFLUCAN) 150  MG tablet Take 1 tablet (150 mg total) by mouth once. May repeat in 3 days if no resolution of symptoms 2 tablet 0   . insulin aspart (NOVOLOG) 100 UNIT/ML FlexPen Inject 15 Units into the skin 3 (three) times daily with meals. 2 pen 0   . insulin aspart (NOVOLOG) 100 UNIT/ML injection Inject 10-15 Units into the skin 3 (three) times daily before meals. 10 mL 11   . insulin glargine (LANTUS) 100 UNIT/ML injection Inject 0.15 mLs (15 Units total) into the skin daily. 10 mL 12   . Liraglutide 18 MG/3ML SOPN Inject 0.3 mLs (1.8 mg total) into the skin daily. 9 mL 3   . methylphenidate (RITALIN LA) 30 MG 24 hr capsule Take 1 capsule (30 mg total) by mouth every morning. 30 capsule 0   . methylphenidate (RITALIN LA) 30 MG 24 hr capsule Take 1 capsule (30 mg total) by mouth every morning. 30 capsule 0   . methylphenidate (RITALIN LA) 30 MG 24 hr capsule Take 1 capsule (30 mg total) by mouth every morning. 30 capsule 0   . olmesartan-hydrochlorothiazide (BENICAR HCT) 40-25 MG tablet Take 1 tablet by mouth daily.   Taking  . rosuvastatin (CRESTOR) 10 MG tablet Take 1 tablet (10 mg total) by  mouth daily. 90 tablet 3 Taking  . triamcinolone (KENALOG) 0.025 % ointment Apply 1 application topically 2 (two) times daily. 30 g 0     Review of Systems  Constitutional: Negative.   Respiratory: Negative.   Cardiovascular: Negative.   Gastrointestinal: Positive for abdominal pain. Negative for blood in stool, constipation, diarrhea, heartburn, nausea and vomiting.  Genitourinary: Negative.   Musculoskeletal: Negative.   Skin: Negative.   Neurological: Negative.   Psychiatric/Behavioral: Negative.    Physical Exam   Blood pressure 120/81, pulse 73, temperature 98 F (36.7 C), temperature source Oral, resp. rate 18, last menstrual period 03/29/2016.  Physical Exam  Vitals reviewed. Constitutional: She is oriented to person, place, and time. She appears well-developed and well-nourished. No distress.   HENT:  Head: Normocephalic and atraumatic.  Eyes: Conjunctivae are normal.  Neck: Neck supple. No thyromegaly present.  Cardiovascular: Normal rate and regular rhythm.   Respiratory: Effort normal and breath sounds normal.  GI: Soft. She exhibits no distension and no mass. There is no tenderness. There is no rebound and no guarding.  Musculoskeletal: Normal range of motion. She exhibits no tenderness.  Neurological: She is alert and oriented to person, place, and time.  Skin: Skin is warm and dry.  Psychiatric: She has a normal mood and affect.    MAU Course  Procedures   Assessment and Plan  50 yo female with abdomnial pain approx 5 weeks after tubal reversal  1-Will r/u acute abdominal process--negative [name]; small fibroid an trace free fluid 2-FSH drawn 3-Pt has appt in office for Femara.  Note sent to Cathlean Marseilles to call patient and inform her policy about elective procedures. Silas Sacramento, MD 04/03/2016, 7:06 PM

## 2016-04-03 NOTE — MAU Note (Signed)
Pt C/O lower abd pain that started over the weekend, is intermittent, had a tubal reversal a month ago.  Denies vaginal bleeding.

## 2016-04-03 NOTE — Assessment & Plan Note (Signed)
Strong desire for pregnancy despite multiple risk to both her and any potential child. Discussed risk of genetic anomalies given her age, and given her history of twin pregnancies the risk a twin pregnancy would place on her, increased risk given her HTN and DM. Additionally discussed risk of preeclampsia associated with HTN and age as well increased risk of neural tube defects with diabetes. Last A1c on 6/13 was 7.3.  Despite this discussion she still desires pregnancy. She has an OB/Gyn appt in the coming weeks during which this will be discussed further.  We also discussed that this clinic will be unable to give her Clomid or any ovulation induction agent give the risks and need for very close monitoring should she receive this She expressed her understanding and acceptance of this

## 2016-04-04 LAB — FOLLICLE STIMULATING HORMONE: FSH: 11.2 m[IU]/mL

## 2016-04-06 LAB — ANTI MULLERIAN HORMONE: AMH AssessR: 0.14 ng/mL

## 2016-04-10 ENCOUNTER — Ambulatory Visit: Payer: No Typology Code available for payment source

## 2016-04-25 ENCOUNTER — Encounter: Payer: Self-pay | Admitting: Medical

## 2016-04-25 ENCOUNTER — Ambulatory Visit (INDEPENDENT_AMBULATORY_CARE_PROVIDER_SITE_OTHER): Payer: Self-pay | Admitting: Medical

## 2016-04-25 ENCOUNTER — Ambulatory Visit (INDEPENDENT_AMBULATORY_CARE_PROVIDER_SITE_OTHER): Payer: Self-pay | Admitting: Clinical

## 2016-04-25 VITALS — BP 116/79 | HR 85 | Ht 61.0 in | Wt 171.9 lb

## 2016-04-25 DIAGNOSIS — Z9889 Other specified postprocedural states: Secondary | ICD-10-CM

## 2016-04-25 DIAGNOSIS — N978 Female infertility of other origin: Secondary | ICD-10-CM

## 2016-04-25 DIAGNOSIS — F909 Attention-deficit hyperactivity disorder, unspecified type: Secondary | ICD-10-CM

## 2016-04-25 LAB — POCT URINALYSIS DIP (DEVICE)
Bilirubin Urine: NEGATIVE
Glucose, UA: 100 mg/dL — AB
Ketones, ur: NEGATIVE mg/dL
Leukocytes, UA: NEGATIVE
Nitrite: NEGATIVE
Protein, ur: 30 mg/dL — AB
Specific Gravity, Urine: 1.02 (ref 1.005–1.030)
Urobilinogen, UA: 1 mg/dL (ref 0.0–1.0)
pH: 7 (ref 5.0–8.0)

## 2016-04-25 NOTE — Progress Notes (Signed)
  ASSESSMENT: Pt currently experiencing ADHD. Pt would benefit from psychoeducation and brief therapeutic interventions regarding coping with ADHD.   Stage of Change: determination  PLAN: 1. F/U with behavioral health clinician as needed 2. Psychiatric Medications: Ritalin, Xanax 3. Behavioral recommendations:   -Continue to practice learned skills to cope with symptoms of ADHD -Read educational material regarding coping with ADHD (as well as anxiety and depression) -Consider ADHD support group, as discussed in office visit today  SUBJECTIVE: Pt. referred by Kerry Hough, PA-C, for ADHD, anxiety Pt. reports the following symptoms/concerns: Pt states that she is being treated with medication for ADHD, and has developed many skills to cope; requests information about coping with anxiety and depression, to use at her work.  Duration of problem: Undetermined number of years Severity: mild   OBJECTIVE: Orientation & Cognition: Oriented x3. Thought processes normal and appropriate to situation. Mood: appropriate Affect: appropriate Appearance: appropriate Risk of harm to self or others: no known risk of harm to self or others Substance use: none Assessments administered: none  Diagnosis: ADHD CPT Code: F90.9  -------------------------------------------- Other(s) present in the room: none  Time spent with patient in exam room: 20 minutes, 9-9:20am

## 2016-04-25 NOTE — Progress Notes (Signed)
Subjective:    Tracy Weiss is a 50 y.o. female who presents today to establish care with GYN. The patient states that she was referred from PCP at [facility], Dr. Gerlean Ren for infertility concerns following a tubal reversal performed at [facility] 03/13/16. She states that MD only performs the surgeries and does not provide other infertility management. She states that she is getting married this year and desires conception as soon as possible. She has had regular periods since the tubal reversal surgery. She is not currently on birth control and has had intercourse at the times that her menstrual calendar app assumed ovulation without success. She states LMP 04/23/16. She has not yet confirmed ovulation using OTC kits. She is a type 2 diabetic on insulin and has a history of PCOS and possible endometriosis. She states that during her recent surgery some endometriosis tissue was removed. The patient states no pain with recent period as she has experienced in the past.    Menstrual History: OB History    Gravida Para Term Preterm AB Living   1         2   SAB TAB Ectopic Multiple Live Births         1        Patient's last menstrual period was 03/26/2016.    The following portions of the patient's history were reviewed and updated as appropriate: allergies, current medications, past family history, past medical history, past social history, past surgical history and problem list.  Review of Systems Pertinent items are noted in HPI.    Objective:     Physical Exam  Vitals reviewed. Constitutional: She is oriented to person, place, and time. She appears well-developed and well-nourished. No distress.  HENT:  Head: Normocephalic.  Cardiovascular: Normal rate.   Respiratory: Effort normal.  GI: Soft.  Neurological: She is alert and oriented to person, place, and time.  Skin: Skin is warm and dry. No erythema.  Psychiatric: She has a normal mood and affect.   .      Assessment:   S/P tubal  reversal Desired fertility    Plan:    1. Discussed appropriate use of OTC ovulation kits 2. Patient will use OTC ovulation kit this month to confirm ovulation and time intercourse appropriately 3. Due to patients age and recent surgery if no pregnancy results this month, will refer to Dr. Kerin Perna 4. Patient has requested Rx for Femara (or Clomid) if referral to Dr. Kerin Perna can not occur immediately, RN will need to contact Dr. Gala Romney for possible Rx if patient requests 5. Patient will call with results after this month. She only desires to see myself or Dr. Gala Romney at this time.   Luvenia Redden, PA-C 04/25/2016 10:50 AM

## 2016-04-25 NOTE — Patient Instructions (Signed)
Infertility Infertility is when you are unable to get pregnant (conceive) after a year of having sex regularly without using birth control. Infertility can also mean that a woman is not able to carry a pregnancy to full term.  Both women and men can have fertility problems. WHAT CAUSES INFERTILITY? What Causes Infertility in Women? There are many possible causes of infertility in women. For some women, the cause of infertility is not known (unexplained infertility). Infertility can also be linked to more than one cause. Infertility problems in women can be caused by problems with the menstrual cycle or reproductive organs, certain medical conditions, and factors related to lifestyle and age.  Problems with your menstrual cycle can interfere with your ovaries producing eggs (ovulation). This can make it difficult to get pregnant. This includes having a menstrual cycle that is very long, very short, or irregular.  Problems with reproductive organs can include:  An abnormally narrow cervix or a cervix that does not remain closed during a pregnancy.  A blockage in your fallopian tubes.  An abnormally shaped uterus.  Uterine fibroids. This is a tissue mass (tumor) that can develop on your uterus.  Medical conditions that can affect a woman's fertility include:  Polycystic ovarian syndrome (PCOS). This is a hormonal disorder that can cause small cysts to grow on your ovaries. This is the most common cause of infertility in women.  Endometriosis. This is a condition in which the tissue that lines your uterus (endometrium) grows outside of its normal location.  Primary ovary insufficiency. This is when your ovaries stop producing eggs and hormones before the age of 61.  Sexually transmitted diseases, such as chlamydia or gonorrhea. These infections can cause scarring in your fallopian tubes. This makes it difficult for eggs to reach your uterus.  Autoimmune disorders. These are disorders in  which your immune system attacks normal, healthy cells.  Hormone imbalances.  Other factors include:  Age. A woman's fertility declines with age, especially after her mid-89s.  Being under- or overweight.  Drinking too much alcohol.  Using drugs.  Exercising excessively.  Being exposed to environmental toxins, such as radiation, pesticides, and certain chemicals. What Causes Infertility in Men? There are many causes of infertility in men. Infertility can be linked to more than one cause. Infertility problems in men can be caused by problems with sperm or the reproductive organs, certain medical conditions, and factors related to lifestyle and age. Some men have unexplained infertility.   Problems with sperm. Infertility can result if there is a problem producing:  Enough sperm (low sperm count).  Enough normally-shaped sperm (sperm morphology).  Sperm that are able to reach the egg (poor motility).  Infertility can also be caused by:  A problem with hormones.  Enlarged veins (varicoceles), cysts (spermatoceles), or tumors of the testicles.  Sexual dysfunction.  Injury to the testicles.  A birth defect, such as not having the tubes that carry sperm (vas deferens).  Medical conditions that can affect a man's fertility include:  Diabetes.  Cancer treatments, such as chemotherapy or radiation.  Klinefelter syndrome. This is an inherited genetic disorder.   Thyroid problems, such as an under- or overactive thyroid.  Cystic fibrosis.  Sexually transmitted diseases.  Other factors include:  Age. A man's fertility declines with age.  Drinking too much alcohol.  Using drugs.  Being exposed to environmental toxins, such as pesticides and lead. WHAT ARE THE SYMPTOMS OF INFERTILITY? Being unable to get pregnant after one year of having  regular sex without using birth control is the only sign of infertility.  HOW IS INFERTILITY DIAGNOSED? In order to be diagnosed  with infertility, both partners will have a physical exam. Both partners will also have an extensive medical and sexual history taken. If there is no obvious reason for infertility, additional tests may be done. What Tests Will Women Have? Women may first have tests to check whether they are ovulating each month. The tests may include:  Blood tests to check hormone levels.  An ultrasound of the ovaries. This looks for possible problems on or in the ovaries.  Taking a small sample of the tissue that lines the uterus for examination under a microscope (endometrial biopsy). Women who are ovulating may have additional tests. These may include:  Hysterosalpingography.  This is an X-ray of the fallopian tubes and uterus taken after a specific type of dye is injected.  This test can show the shape of the uterus and whether the fallopian tubes are open.  Laparoscopy.  In this test, a lighted tube (laparoscope) is used to look for problems in the fallopian tubes and other female organs.  Transvaginal ultrasound.  This is an imaging test to check for abnormalities of the uterus and ovaries.  A health care provider can use this test to count the number of follicles on the ovaries.  Hysteroscopy.  This test involves using a lighted tube to examine the cervix and inside the uterus.  It is done to find any abnormalities inside the uterus. What Tests Will Men Have? Tests for men's infertility includes:  Semen tests to check sperm count, morphology, and motility.  Blood tests to check for hormone levels.  Taking a small sample of tissue from inside a testicle (biopsy). This is examined under a microscope.  Blood tests to check for genetic abnormalities (genetic testing). HOW ARE WOMEN TREATED FOR INFERTILITY?  Treatment depends on the cause of infertility. Most cases of infertility in women are treated with medicine or surgery.  Women may take medicine to:  Correct ovulation  problems.  Treat other health conditions, such as PCOS.  Surgery may be done to:  Repair damage to the ovaries, fallopian tubes, cervix, or uterus.  Remove growths from the uterus.  Remove scar tissue from the uterus, pelvis, or other female organs. HOW ARE MEN TREATED FOR INFERTILITY?  Treatment depends on the cause of infertility. Most cases of infertility in men are treated with medicine or surgery.   Men may take medicine to:  Correct hormone problems.  Treat other health conditions.  Treat sexual dysfunction.  Surgery may be done to:  Remove blockages in the reproductive tract.  Correct other structural problems of the reproductive tract. WHAT IS ASSISTED REPRODUCTIVE TECHNOLOGY? Assisted reproductive technology (ART) refers to all treatments and procedures that combine eggs and sperm outside the body to try to help a couple conceive. ART is often combined with fertility drugs to stimulate ovulation. Sometimes ART is done using eggs retrieved from another woman's body (donor eggs) or from previously frozen fertilized eggs (embryos).  There are different types of ART. These include:   Intrauterine insemination (IUI).  In this procedure, sperm is placed directly into a woman's uterus with a long, thin tube.  This may be most effective for infertility caused by sperm problems, including low sperm count and low motility.  Can be used in combination with fertility drugs.  In vitro fertilization (IVF).  This is often done when a woman's fallopian tubes are blocked  or when a man has low sperm counts.  Fertility drugs stimulate the ovaries to produce multiple eggs. Once mature, these eggs are removed from the body and combined with the sperm to be fertilized.  These fertilized eggs are then placed in the woman's uterus.   This information is not intended to replace advice given to you by your health care provider. Make sure you discuss any questions you have with your  health care provider.   Document Released: 08/23/2003 Document Revised: 05/11/2015 Document Reviewed: 05/05/2014 Elsevier Interactive Patient Education [employer].

## 2016-06-14 ENCOUNTER — Telehealth: Payer: Self-pay

## 2016-06-14 NOTE — Telephone Encounter (Signed)
Pt was seen at [facility] and was told to call and get an appointment with Dr. Gala Romney for infertility. Spoke with Dr. Gala Romney and she would like pt to be referred to Dr. Kerin Perna for infertility. Pt is aware that referral will be faxed over to Dr. Kerin Perna office and they will call her with appt. Date/time.

## 2016-07-23 ENCOUNTER — Telehealth: Payer: Self-pay | Admitting: Family Medicine

## 2016-07-23 NOTE — Telephone Encounter (Signed)
Pt is calling because she has made an appointment with Dr. Gerlean Ren for 07/31/16 to discuss getting back on Metformin.She would also like to speak to Dr. Valentina Lucks before then and get his advice. Please call patient. jw

## 2016-07-31 ENCOUNTER — Ambulatory Visit: Payer: Self-pay | Admitting: Family Medicine

## 2016-08-13 ENCOUNTER — Ambulatory Visit (INDEPENDENT_AMBULATORY_CARE_PROVIDER_SITE_OTHER): Payer: Self-pay | Admitting: Family Medicine

## 2016-08-13 ENCOUNTER — Encounter: Payer: Self-pay | Admitting: Family Medicine

## 2016-08-13 VITALS — BP 116/86 | HR 83 | Temp 97.4°F | Ht 61.0 in | Wt 178.2 lb

## 2016-08-13 DIAGNOSIS — Z23 Encounter for immunization: Secondary | ICD-10-CM

## 2016-08-13 DIAGNOSIS — B9789 Other viral agents as the cause of diseases classified elsewhere: Secondary | ICD-10-CM

## 2016-08-13 DIAGNOSIS — E119 Type 2 diabetes mellitus without complications: Secondary | ICD-10-CM

## 2016-08-13 DIAGNOSIS — J069 Acute upper respiratory infection, unspecified: Secondary | ICD-10-CM

## 2016-08-13 DIAGNOSIS — Z794 Long term (current) use of insulin: Secondary | ICD-10-CM

## 2016-08-13 DIAGNOSIS — F902 Attention-deficit hyperactivity disorder, combined type: Secondary | ICD-10-CM

## 2016-08-13 DIAGNOSIS — E7800 Pure hypercholesterolemia, unspecified: Secondary | ICD-10-CM

## 2016-08-13 LAB — POCT GLYCOSYLATED HEMOGLOBIN (HGB A1C): Hemoglobin A1C: 8.9

## 2016-08-13 MED ORDER — INSULIN GLARGINE 100 UNIT/ML ~~LOC~~ SOLN: 15.0000 [IU] | Freq: Every day | SUBCUTANEOUS | 12 refills | Status: DC

## 2016-08-13 MED ORDER — METHYLPHENIDATE HCL ER (LA) 30 MG PO CP24: 30.0000 mg | ORAL_CAPSULE | ORAL | 0 refills | Status: DC

## 2016-08-13 MED ORDER — ROSUVASTATIN CALCIUM 10 MG PO TABS: 10.0000 mg | ORAL_TABLET | Freq: Every day | ORAL | 3 refills | Status: DC

## 2016-08-13 MED ORDER — INSULIN ASPART 100 UNIT/ML ~~LOC~~ SOLN: 15.0000 [IU] | Freq: Three times a day (TID) | SUBCUTANEOUS | 0 refills | Status: DC

## 2016-08-13 MED ORDER — INSULIN ASPART 100 UNIT/ML ~~LOC~~ SOLN: 15.0000 [IU] | Freq: Three times a day (TID) | SUBCUTANEOUS | 11 refills | Status: DC

## 2016-08-13 MED ORDER — LIRAGLUTIDE 18 MG/3ML ~~LOC~~ SOPN: 1.8000 mg | PEN_INJECTOR | Freq: Every day | SUBCUTANEOUS | 3 refills | Status: DC

## 2016-08-13 MED ORDER — OLMESARTAN MEDOXOMIL-HCTZ 40-25 MG PO TABS: 1.0000 | ORAL_TABLET | Freq: Every day | ORAL | 3 refills | Status: DC

## 2016-08-13 NOTE — Patient Instructions (Addendum)
Thank you so much for coming to visit today! Your A1C went from 7.3 to 8.9! We need to work on making your diabetes regimen more consistent. You should take Lantus 15units in the morning, Novolog 10units with every meal, and Victoza daily. If your A1C is still elevated in 34months after consistently taking these medications daily, we will adjust your regiment. Please check your blood sugar twice daily and record those numbers and time--check once before breakfast and once an hour after your largest meal of the day. Please follow up with Dr. Valentina Lucks. Please return in 3 months.   I have refilled your Ritalin.   Other refills were faxed to the health department.  We will check your electrolytes, kidney function, and liver function today as well as your cholesterol level. The American Heart Association guidelines recommend you should be on a cholesterol medication based on your age and diabetes diagnosis alone.   Dr. Gerlean Ren

## 2016-08-13 NOTE — Assessment & Plan Note (Signed)
Ritalin refilled. Follow-up in 3 months.

## 2016-08-13 NOTE — Progress Notes (Addendum)
Subjective:     Patient ID: [name], female   DOB: 29-Apr-1966, 50 y.o.   MRN: [id number]  HPI Mrs. Thaggard is a 50 year old female presenting today for diabetes follow-up.  # Diabetes:  There have been problems in the past of Mrs. Seger adjusting her diabetes regimen on her own, including self-discontinuing medications, changing doses/frequency of prescribed medications, and starting back old medications. Today, Mrs. Triano reports she is taking NovoLog 10 units 3 times daily with meals occasionally, Lantus 10 units in the morning occasionally, restarted Victoza 1.8 today. Reports she does not like medication hanging out in her body throughout the night and believes it contributes to her weight gain and "fat pockets". Has discontinued Benicar, although she acknowledges this is for renal protection. Self discontinued Crestor. Reports she checks her blood sugar twice a day and is usually in the 200s, however she does note some up in the 400s. Previously followed by Dr. Valentina Lucks and she is interested in returning to see him.  # ADHD: Previously on Ritalin however she acknowledges she has been out of this medication for a few months. 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 Reports that since discontinueing ther medicine she feels like she is all over the place in terms of focusing on tasks. Requests refill of Ritalin.  # Viral URI: Reports symptoms of congestion, cough, runny nose, watery eyes since Thursday, 08/09/2016. Denies fever, chest pain, shortness of breath. Reports she feels drained and exhausted and feels like a day of rest would significantly help her feel better. Requests a work note for tomorrow.  Nonsmoker  Review of Systems Per HPI     Objective:   Physical Exam  Constitutional: She appears well-developed and well-nourished. No distress.  HENT:  Mouth/Throat: Oropharynx is clear and moist. No oropharyngeal exudate.  Eyes: Right eye exhibits no discharge. Left  eye exhibits no discharge.  Cardiovascular: Normal rate and regular rhythm.   No murmur heard. Pedal pulses palpable  Pulmonary/Chest: Effort normal. No respiratory distress. She has no wheezes.  Abdominal: Soft. She exhibits no distension. There is no tenderness.  Neurological:  Sensation intact over feet bilaterally  Skin: No rash noted.  No callous noted on feet bilaterally  Psychiatric: She has a normal mood and affect. Her behavior is normal.      Assessment and Plan:     Diabetes mellitus without complication Discussed importance of taking her diabetes regimen consistently instead of only periodically. Current diabetes regimen consisting NovoLog 10 units with meals, Lantus 15 units in the morning, Victoza 1.8. Recommend restarting Benicar and Crestor. To check blood sugars twice daily and bring log to next visit. Will follow-up with Dr. Everitt Amber. Follow-up in 3 months to repeat A1c and adjust medications. Discussed that it is difficult to adjust medications if she is not consistently taking those prescribed.  Attention deficit disorder Ritalin refilled. Follow-up in 3 months.  Viral URI Continue symptomatically management. Antibiotics not indicated given viral etiology. Follow-up if no improvement.

## 2016-08-13 NOTE — Assessment & Plan Note (Signed)
Discussed importance of taking her diabetes regimen consistently instead of only periodically. Current diabetes regimen consisting NovoLog 10 units with meals, Lantus 15 units in the morning, Victoza 1.8. Recommend restarting Benicar and Crestor. To check blood sugars twice daily and bring log to next visit. Will follow-up with Dr. Everitt Amber. Follow-up in 3 months to repeat A1c and adjust medications. Discussed that it is difficult to adjust medications if she is not consistently taking those prescribed.

## 2016-08-14 LAB — COMPREHENSIVE METABOLIC PANEL
ALT: 14 U/L (ref 6–29)
AST: 18 U/L (ref 10–35)
Albumin: 4.1 g/dL (ref 3.6–5.1)
Alkaline Phosphatase: 54 U/L (ref 33–115)
BUN: 6 mg/dL — ABNORMAL LOW (ref 7–25)
CO2: 27 mmol/L (ref 20–31)
Calcium: 9.7 mg/dL (ref 8.6–10.2)
Chloride: 102 mmol/L (ref 98–110)
Creat: 0.77 mg/dL (ref 0.50–1.10)
Glucose, Bld: 172 mg/dL — ABNORMAL HIGH (ref 65–99)
Potassium: 4.1 mmol/L (ref 3.5–5.3)
Sodium: 138 mmol/L (ref 135–146)
Total Bilirubin: 0.3 mg/dL (ref 0.2–1.2)
Total Protein: 7.2 g/dL (ref 6.1–8.1)

## 2016-08-14 LAB — LIPID PANEL
Cholesterol: 220 mg/dL — ABNORMAL HIGH (ref <200)
HDL: 77 mg/dL (ref >50)
LDL Cholesterol: 117 mg/dL — ABNORMAL HIGH (ref <100)
Total CHOL/HDL Ratio: 2.9 Ratio (ref <5.0)
Triglycerides: 128 mg/dL (ref <150)
VLDL: 26 mg/dL (ref <30)

## 2016-08-16 ENCOUNTER — Telehealth: Payer: Self-pay | Admitting: *Deleted

## 2016-08-16 NOTE — Telephone Encounter (Signed)
Received fax from [facility] stating, "[employer] no longer assists patient's with free crestor-would you consider writing for Livalo?"  "Also, patient was previously d/c on Benicar HCT-please verify this has been restarted."  please advise. Hubbard Hartshorn, RN, BSN

## 2016-08-20 ENCOUNTER — Telehealth: Payer: Self-pay | Admitting: *Deleted

## 2016-08-20 LAB — HM DIABETES EYE EXAM

## 2016-08-20 NOTE — Telephone Encounter (Signed)
Patient calls stating that she forgot to ask for a refill on her alprazolam at her visit last week, states she really needs this and would like a refill called into [facility].

## 2016-08-21 ENCOUNTER — Encounter: Payer: Self-pay | Admitting: Family Medicine

## 2016-08-22 MED ORDER — PITAVASTATIN CALCIUM 2 MG PO TABS: 2.0000 mg | ORAL_TABLET | Freq: Every morning | ORAL | 3 refills | Status: DC

## 2016-08-22 NOTE — Telephone Encounter (Signed)
Prescription for Pitavastatin printed and faxed to [facility].   Benicar-HCTZ has been prescribed. Patient is supposed to be taking this for renal protection with worsening diabetes and proteinuria. I do not believe Mrs. Villaverde has hypertension as she has been normotensive despite being off of anti-hypertensives at this visit. Was previously placed on Benicar, however she refuses to take this medication or any other ACE or ARB without the HCTZ component. Suspect benefit of Benicar even with HCTZ outweights risk of not being on medication for renal protection.   Dr. Gerlean Ren

## 2016-08-23 MED ORDER — ALPRAZOLAM 0.25 MG PO TABS: 0.2500 mg | ORAL_TABLET | Freq: Two times a day (BID) | ORAL | 0 refills | Status: DC | PRN

## 2016-08-23 NOTE — Addendum Note (Signed)
Addended by: Lorna Few on: 08/23/2016 01:33 PM   Modules accepted: Orders

## 2016-08-23 NOTE — Progress Notes (Signed)
Opened in Error.

## 2016-09-03 DIAGNOSIS — K8020 Calculus of gallbladder without cholecystitis without obstruction: Secondary | ICD-10-CM

## 2016-09-03 HISTORY — DX: Calculus of gallbladder without cholecystitis without obstruction: K80.20

## 2016-09-09 ENCOUNTER — Inpatient Hospital Stay (HOSPITAL_COMMUNITY)
Admission: AD | Admit: 2016-09-09 | Discharge: 2016-09-10 | Disposition: A | Discharge status: Home / Self Care | Payer: No Typology Code available for payment source | Source: Ambulatory Visit | Attending: Obstetrics and Gynecology | Admitting: Obstetrics and Gynecology

## 2016-09-09 ENCOUNTER — Encounter (HOSPITAL_COMMUNITY): Payer: Self-pay

## 2016-09-09 ENCOUNTER — Telehealth: Payer: Self-pay | Admitting: Family Medicine

## 2016-09-09 DIAGNOSIS — R102 Pelvic and perineal pain: Secondary | ICD-10-CM

## 2016-09-09 DIAGNOSIS — Z87891 Personal history of nicotine dependence: Secondary | ICD-10-CM | POA: Insufficient documentation

## 2016-09-09 DIAGNOSIS — Z794 Long term (current) use of insulin: Secondary | ICD-10-CM | POA: Insufficient documentation

## 2016-09-09 DIAGNOSIS — E119 Type 2 diabetes mellitus without complications: Secondary | ICD-10-CM | POA: Insufficient documentation

## 2016-09-09 LAB — URINALYSIS, ROUTINE W REFLEX MICROSCOPIC
Bacteria, UA: NONE SEEN
Bilirubin Urine: NEGATIVE
Glucose, UA: >=500 mg/dL — AB
Hgb urine dipstick: NEGATIVE
Ketones, ur: NEGATIVE mg/dL
Leukocytes, UA: NEGATIVE
Nitrite: NEGATIVE
Protein, ur: NEGATIVE mg/dL
RBC / HPF: NONE SEEN RBC/hpf (ref 0–5)
Specific Gravity, Urine: 1.007 (ref 1.005–1.030)
pH: 6 (ref 5.0–8.0)

## 2016-09-09 LAB — POCT PREGNANCY, URINE: Preg Test, Ur: NEGATIVE

## 2016-09-09 NOTE — Telephone Encounter (Signed)
Family Medicine After hours phone call  Patient called stating that she had new worsening abdominal pain. States that pain started shortly after eating "something red" earlier today. She had one episode of vomiting shortly after, then the pain began. Pain is in the RUQ and feels like it "goes under my ribs". No additional vomiting. She has been able to drink water and feels as though that can continue. Denies fever. Denies SOB, CP, diarrhea. LMP was slightly shorter than typical (3 days instead of 5).  Informed her that w/o exam it was difficult to fully assess what was wrong. As long as she is afebrile and able to drink then I would be willing to make a SDA appt for her in our clinic tomorrow. If she didn't feel comfortable with that plan then evaluation in nearest ED/UC would be appropriate. Avoid Ibuprofen if possible. Patient stated her understanding, and that she would "try" but would report to [facility] hosp if pain persisted.  SDA Appt set up w/ Dr. Ola Spurr tomorrow (1/8) @9 :Merton Border, MD,MS,  PGY3 09/09/2016 10:15 PM

## 2016-09-09 NOTE — MAU Note (Signed)
Pt here with abdominal pain that started about 2030 tonight, took Tylenol about 2200, still has pain. Feels like her abdomen is swollen.

## 2016-09-10 ENCOUNTER — Emergency Department (HOSPITAL_BASED_OUTPATIENT_CLINIC_OR_DEPARTMENT_OTHER): Payer: Self-pay

## 2016-09-10 ENCOUNTER — Ambulatory Visit: Payer: Self-pay | Admitting: Internal Medicine

## 2016-09-10 ENCOUNTER — Inpatient Hospital Stay (HOSPITAL_BASED_OUTPATIENT_CLINIC_OR_DEPARTMENT_OTHER)
Admission: EM | Admit: 2016-09-10 | Discharge: 2016-09-12 | DRG: 418 | Disposition: A | Discharge status: Home / Self Care | Payer: Self-pay | Attending: Family Medicine | Admitting: Family Medicine

## 2016-09-10 DIAGNOSIS — B373 Candidiasis of vulva and vagina: Secondary | ICD-10-CM | POA: Diagnosis present

## 2016-09-10 DIAGNOSIS — K8000 Calculus of gallbladder with acute cholecystitis without obstruction: Principal | ICD-10-CM | POA: Diagnosis present

## 2016-09-10 DIAGNOSIS — Z87891 Personal history of nicotine dependence: Secondary | ICD-10-CM

## 2016-09-10 DIAGNOSIS — Z6833 Body mass index (BMI) 33.0-33.9, adult: Secondary | ICD-10-CM

## 2016-09-10 DIAGNOSIS — R102 Pelvic and perineal pain: Secondary | ICD-10-CM

## 2016-09-10 DIAGNOSIS — E282 Polycystic ovarian syndrome: Secondary | ICD-10-CM | POA: Diagnosis present

## 2016-09-10 DIAGNOSIS — K819 Cholecystitis, unspecified: Secondary | ICD-10-CM

## 2016-09-10 DIAGNOSIS — E871 Hypo-osmolality and hyponatremia: Secondary | ICD-10-CM | POA: Diagnosis present

## 2016-09-10 DIAGNOSIS — E1165 Type 2 diabetes mellitus with hyperglycemia: Secondary | ICD-10-CM | POA: Diagnosis present

## 2016-09-10 DIAGNOSIS — D649 Anemia, unspecified: Secondary | ICD-10-CM | POA: Diagnosis present

## 2016-09-10 DIAGNOSIS — I10 Essential (primary) hypertension: Secondary | ICD-10-CM | POA: Diagnosis present

## 2016-09-10 DIAGNOSIS — Z833 Family history of diabetes mellitus: Secondary | ICD-10-CM

## 2016-09-10 DIAGNOSIS — E669 Obesity, unspecified: Secondary | ICD-10-CM | POA: Diagnosis present

## 2016-09-10 DIAGNOSIS — K810 Acute cholecystitis: Secondary | ICD-10-CM

## 2016-09-10 DIAGNOSIS — E785 Hyperlipidemia, unspecified: Secondary | ICD-10-CM

## 2016-09-10 DIAGNOSIS — R739 Hyperglycemia, unspecified: Secondary | ICD-10-CM

## 2016-09-10 DIAGNOSIS — Z9119 Patient's noncompliance with other medical treatment and regimen: Secondary | ICD-10-CM

## 2016-09-10 DIAGNOSIS — K8050 Calculus of bile duct without cholangitis or cholecystitis without obstruction: Secondary | ICD-10-CM

## 2016-09-10 HISTORY — DX: Cholecystitis, unspecified: K81.9

## 2016-09-10 HISTORY — DX: Calculus of gallbladder without cholecystitis without obstruction: K80.20

## 2016-09-10 LAB — COMPREHENSIVE METABOLIC PANEL
ALT: 19 U/L (ref 14–54)
ALT: 20 U/L (ref 14–54)
AST: 23 U/L (ref 15–41)
AST: 21 U/L (ref 15–41)
Albumin: 4.4 g/dL (ref 3.5–5.0)
Albumin: 3.6 g/dL (ref 3.5–5.0)
Alkaline Phosphatase: 47 U/L (ref 38–126)
Alkaline Phosphatase: 45 U/L (ref 38–126)
Anion gap: 11 (ref 5–15)
Anion gap: 9 (ref 5–15)
BUN: 10 mg/dL (ref 6–20)
BUN: <5 mg/dL — ABNORMAL LOW (ref 6–20)
CO2: 27 mmol/L (ref 22–32)
CO2: 25 mmol/L (ref 22–32)
Calcium: 9.8 mg/dL (ref 8.9–10.3)
Calcium: 9.3 mg/dL (ref 8.9–10.3)
Chloride: 94 mmol/L — ABNORMAL LOW (ref 101–111)
Chloride: 99 mmol/L — ABNORMAL LOW (ref 101–111)
Creatinine, Ser: 0.81 mg/dL (ref 0.44–1.00)
Creatinine, Ser: 0.84 mg/dL (ref 0.44–1.00)
GFR calc Af Amer: >60 mL/min (ref >60)
GFR calc Af Amer: >60 mL/min (ref >60)
GFR calc non Af Amer: >60 mL/min (ref >60)
GFR calc non Af Amer: >60 mL/min (ref >60)
Glucose, Bld: 326 mg/dL — ABNORMAL HIGH (ref 65–99)
Glucose, Bld: 246 mg/dL — ABNORMAL HIGH (ref 65–99)
Potassium: 3.9 mmol/L (ref 3.5–5.1)
Potassium: 3.7 mmol/L (ref 3.5–5.1)
Sodium: 132 mmol/L — ABNORMAL LOW (ref 135–145)
Sodium: 133 mmol/L — ABNORMAL LOW (ref 135–145)
Total Bilirubin: 0.4 mg/dL (ref 0.3–1.2)
Total Bilirubin: 0.8 mg/dL (ref 0.3–1.2)
Total Protein: 7.8 g/dL (ref 6.5–8.1)
Total Protein: 6.5 g/dL (ref 6.5–8.1)

## 2016-09-10 LAB — CBC WITH DIFFERENTIAL/PLATELET
Basophils Absolute: 0 10*3/uL (ref 0.0–0.1)
Basophils Relative: 0 %
Eosinophils Absolute: 0 10*3/uL (ref 0.0–0.7)
Eosinophils Relative: 0 %
HCT: 37.3 % (ref 36.0–46.0)
Hemoglobin: 12.3 g/dL (ref 12.0–15.0)
Lymphocytes Relative: 17 %
Lymphs Abs: 1.6 10*3/uL (ref 0.7–4.0)
MCH: 29.3 pg (ref 26.0–34.0)
MCHC: 33 g/dL (ref 30.0–36.0)
MCV: 88.8 fL (ref 78.0–100.0)
Monocytes Absolute: 0.4 10*3/uL (ref 0.1–1.0)
Monocytes Relative: 5 %
Neutro Abs: 7.2 10*3/uL (ref 1.7–7.7)
Neutrophils Relative %: 78 %
Platelets: 362 10*3/uL (ref 150–400)
RBC: 4.2 MIL/uL (ref 3.87–5.11)
RDW: 12.1 % (ref 11.5–15.5)
WBC: 9.3 10*3/uL (ref 4.0–10.5)

## 2016-09-10 LAB — WET PREP, GENITAL
Clue Cells Wet Prep HPF POC: NONE SEEN
Sperm: NONE SEEN
Trich, Wet Prep: NONE SEEN
Yeast Wet Prep HPF POC: NONE SEEN

## 2016-09-10 LAB — CBG MONITORING, ED
Glucose-Capillary: 273 mg/dL — ABNORMAL HIGH (ref 65–99)
Glucose-Capillary: 240 mg/dL — ABNORMAL HIGH (ref 65–99)

## 2016-09-10 LAB — LIPASE, BLOOD
Lipase: 24 U/L (ref 11–51)
Lipase: 19 U/L (ref 11–51)

## 2016-09-10 LAB — GLUCOSE, CAPILLARY
Glucose-Capillary: 240 mg/dL — ABNORMAL HIGH (ref 65–99)
Glucose-Capillary: 207 mg/dL — ABNORMAL HIGH (ref 65–99)

## 2016-09-10 MED ORDER — SODIUM CHLORIDE 0.9% FLUSH: 3.0000 mL | INTRAVENOUS | Status: DC | PRN

## 2016-09-10 MED ORDER — FENTANYL CITRATE (PF) 100 MCG/2ML IJ SOLN
100.0000 ug | Freq: Once | INTRAMUSCULAR | Status: AC
  Administered 2016-09-10: 100 ug via INTRAVENOUS
  Filled 2016-09-10: qty 2

## 2016-09-10 MED ORDER — PRAVASTATIN SODIUM 40 MG PO TABS
40.0000 mg | ORAL_TABLET | Freq: Every day | ORAL | Status: DC
  Administered 2016-09-10 – 2016-09-11 (×2): 40 mg via ORAL
  Filled 2016-09-10 (×2): qty 1

## 2016-09-10 MED ORDER — DEXTROSE 5 % IV SOLN
2.0000 g | Freq: Once | INTRAVENOUS | Status: AC
  Administered 2016-09-10: 2 g via INTRAVENOUS
  Filled 2016-09-10: qty 2

## 2016-09-10 MED ORDER — SODIUM CHLORIDE 0.45 % IV SOLN
INTRAVENOUS | Status: AC
  Administered 2016-09-10: 21:00:00 via INTRAVENOUS

## 2016-09-10 MED ORDER — ACETAMINOPHEN 650 MG RE SUPP: 650.0000 mg | Freq: Four times a day (QID) | RECTAL | Status: DC | PRN

## 2016-09-10 MED ORDER — ACETAMINOPHEN 325 MG PO TABS: 650.0000 mg | ORAL_TABLET | Freq: Four times a day (QID) | ORAL | Status: DC | PRN

## 2016-09-10 MED ORDER — FENTANYL CITRATE (PF) 100 MCG/2ML IJ SOLN
50.0000 ug | INTRAMUSCULAR | Status: DC | PRN
  Administered 2016-09-10 – 2016-09-11 (×7): 50 ug via INTRAVENOUS
  Filled 2016-09-10 (×7): qty 2

## 2016-09-10 MED ORDER — ONDANSETRON HCL 4 MG/2ML IJ SOLN
4.0000 mg | Freq: Four times a day (QID) | INTRAMUSCULAR | Status: DC | PRN
  Administered 2016-09-11: 4 mg via INTRAVENOUS

## 2016-09-10 MED ORDER — ONDANSETRON HCL 4 MG/2ML IJ SOLN
4.0000 mg | Freq: Once | INTRAMUSCULAR | Status: AC
  Administered 2016-09-10: 4 mg via INTRAVENOUS
  Filled 2016-09-10: qty 2

## 2016-09-10 MED ORDER — SODIUM CHLORIDE 0.9% FLUSH: 3.0000 mL | Freq: Two times a day (BID) | INTRAVENOUS | Status: DC

## 2016-09-10 MED ORDER — DEXTROSE 5 % IV SOLN
2.0000 g | INTRAVENOUS | Status: DC
  Administered 2016-09-11 (×2): 2 g via INTRAVENOUS
  Filled 2016-09-10 (×2): qty 2

## 2016-09-10 MED ORDER — ONDANSETRON HCL 4 MG PO TABS: 4.0000 mg | ORAL_TABLET | Freq: Four times a day (QID) | ORAL | Status: DC | PRN

## 2016-09-10 MED ORDER — SODIUM CHLORIDE 0.9 % IV SOLN
Freq: Once | INTRAVENOUS | Status: AC
  Administered 2016-09-10: 05:00:00 via INTRAVENOUS

## 2016-09-10 MED ORDER — INSULIN ASPART 100 UNIT/ML ~~LOC~~ SOLN
0.0000 [IU] | Freq: Every day | SUBCUTANEOUS | Status: DC
  Administered 2016-09-10 – 2016-09-11 (×2): 2 [IU] via SUBCUTANEOUS

## 2016-09-10 MED ORDER — SODIUM CHLORIDE 0.9 % IV SOLN: 250.0000 mL | INTRAVENOUS | Status: DC | PRN

## 2016-09-10 MED ORDER — INSULIN ASPART 100 UNIT/ML ~~LOC~~ SOLN
0.0000 [IU] | Freq: Three times a day (TID) | SUBCUTANEOUS | Status: DC
  Administered 2016-09-10 – 2016-09-11 (×2): 5 [IU] via SUBCUTANEOUS
  Administered 2016-09-11: 8 [IU] via SUBCUTANEOUS
  Administered 2016-09-12: 3 [IU] via SUBCUTANEOUS
  Administered 2016-09-12: 5 [IU] via SUBCUTANEOUS

## 2016-09-10 NOTE — MAU Provider Note (Signed)
History   [id number]   Chief Complaint  Patient presents with  . Possible Pregnancy  . Abdominal Pain    HPI Tracy Weiss is a 51 y.o. female here with report of lower pelvic pain that started tonight.  Pain is described as sharp and on lower right pelvis.  Pain rated 10/10 at home, now rated 5/10.  A patient of Dr. Kerin Perna.  Attempting to get pregnant after a BTL reversal.  On day 4 of taking Femara.  LMP on 09/03/16, stated began medication early to induce ovulation.  +appt with Dr. Kerin Perna tomorrow.  No other symptoms at this time.    Patient's last menstrual period was 09/03/2016 (exact date).  OB History  Gravida Para Term Preterm AB Living  1         2  SAB TAB Ectopic Multiple Live Births        1      # Outcome Date GA Lbr Len/2nd Weight Sex Delivery Anes PTL Lv  1 Gravida               Past Medical History:  Diagnosis Date  . Diabetes mellitus without complication ([place])     Family History  Problem Relation Age of Onset  . Cancer Mother     Metastatic with Unknown Primary; Stomach was involved  . Diabetes Father   . Heart disease Father     Social History   Social History  . Marital status: Single    Spouse name: N/A  . Number of children: N/A  . Years of education: N/A   Social History Main Topics  . Smoking status: Former Smoker    Packs/day: 0.50    Years: 10.00    Types: Cigarettes    Start date: 09/03/1982    Quit date: 09/04/1983  . Smokeless tobacco: Never Used  . Alcohol use No  . Drug use: No  . Sexual activity: Yes    Birth control/ protection: None   Other Topics Concern  . None   Social History Narrative  . None    No Known Allergies  No current facility-administered medications on file prior to encounter.    Current Outpatient Prescriptions on File Prior to Encounter  Medication Sig Dispense Refill  . ALPRAZolam (XANAX) 0.25 MG tablet Take 1 tablet (0.25 mg total) by mouth 2 (two) times daily as needed for anxiety. 20 tablet 0   . insulin aspart (NOVOLOG) 100 UNIT/ML injection Inject 15 Units into the skin 3 (three) times daily before meals. 10 mL 0  . insulin glargine (LANTUS) 100 UNIT/ML injection Inject 0.15 mLs (15 Units total) into the skin daily. 10 mL 12  . liraglutide 18 MG/3ML SOPN Inject 0.3 mLs (1.8 mg total) into the skin daily. 9 mL 3  . methylphenidate (RITALIN LA) 30 MG 24 hr capsule Take 1 capsule (30 mg total) by mouth every morning. 30 capsule 0  . olmesartan-hydrochlorothiazide (BENICAR HCT) 40-25 MG tablet Take 1 tablet by mouth daily. 90 tablet 3  . Pitavastatin Calcium 2 MG TABS Take 1 tablet (2 mg total) by mouth every morning. 90 tablet 3  . triamcinolone (KENALOG) 0.025 % ointment Apply 1 application topically 2 (two) times daily. (Patient not taking: Reported on 04/25/2016) 30 g 0     Review of Systems  Constitutional: Negative for chills and fever.  Gastrointestinal: Positive for constipation. Negative for diarrhea, nausea and vomiting.  Genitourinary: Positive for pelvic pain. Negative for dysuria, hematuria, vaginal bleeding and vaginal discharge.  Neurological: Negative for headaches.  All other systems reviewed and are negative.    Physical Exam   Vitals:   09/09/16 2347  BP: 141/84  Pulse: 75  Resp: 18  Temp: 97.9 F (36.6 C)  TempSrc: Oral  SpO2: 98%  Weight: 178 lb (80.7 kg)  Height: 5\' 1"  (1.549 m)    Physical Exam  Constitutional: She is oriented to person, place, and time. She appears well-developed and well-nourished.  HENT:  Head: Normocephalic.  Eyes: Pupils are equal, round, and reactive to light.  Neck: Normal range of motion. Neck supple.  Cardiovascular: Normal rate, regular rhythm and normal heart sounds.   Respiratory: Effort normal and breath sounds normal.  GI: Soft. She exhibits no mass. There is tenderness. There is no rigidity, no rebound and no guarding.  Genitourinary: Uterus is not enlarged. Right adnexum displays tenderness. Right adnexum  displays no mass and no fullness. Left adnexum displays no mass, no tenderness and no fullness.  Genitourinary Comments: Negative cervical motion tenderness  Neurological: She is alert and oriented to person, place, and time. She has normal reflexes.  Skin: Skin is warm and dry.    MAU Course  Procedures  MDM Results for orders placed or performed during the hospital encounter of 09/09/16 (from the past 24 hour(s))  Urinalysis, Routine w reflex microscopic     Status: Abnormal   Collection Time: 09/09/16 11:30 PM  Result Value Ref Range   Color, Urine STRAW (A) YELLOW   APPearance CLEAR CLEAR   Specific Gravity, Urine 1.007 1.005 - 1.030   pH 6.0 5.0 - 8.0   Glucose, UA >=500 (A) NEGATIVE mg/dL   Hgb urine dipstick NEGATIVE NEGATIVE   Bilirubin Urine NEGATIVE NEGATIVE   Ketones, ur NEGATIVE NEGATIVE mg/dL   Protein, ur NEGATIVE NEGATIVE mg/dL   Nitrite NEGATIVE NEGATIVE   Leukocytes, UA NEGATIVE NEGATIVE   RBC / HPF NONE SEEN 0 - 5 RBC/hpf   WBC, UA 0-5 0 - 5 WBC/hpf   Bacteria, UA NONE SEEN NONE SEEN   Squamous Epithelial / LPF 0-5 (A) NONE SEEN  Pregnancy, urine POC     Status: None   Collection Time: 09/09/16 11:41 PM  Result Value Ref Range   Preg Test, Ur NEGATIVE NEGATIVE     Assessment and Plan  Pelvic Pain - likely ovulation  Plan: Discharge home Keep appt with Dr. Kerin Perna for tomorrow 09/10/16   Gwen Pounds, CNM 09/10/2016 12:46 AM

## 2016-09-10 NOTE — Progress Notes (Signed)
Tracy Weiss is a 51 y.o. female patient admitted. Awake, alert - oriented  X 4 - no acute distress noted.  VSS - Blood pressure 136/78, pulse 84, temperature 98.1 F (36.7 C), temperature source Oral, resp. rate 18, height 5\' 1"  (1.549 m), weight 79.8 kg (176 lb), last menstrual period 09/02/2016, SpO2 99 %.    IV in place, occlusive dsg intact without redness.  Orientation to room, and floor completed.  Admission INP armband ID verified with patient/family, and in place.   SR up x 2, fall assessment complete, with patient and family able to verbalize understanding of risk associated with falls, and verbalized understanding to call nsg before up out of bed.  Call light within reach, patient able to voice, and demonstrate understanding. No evidence of skin break down noted on exam.  Admission nurse notified of admission.   Dr. Erin Hearing notified of admission   Will cont to eval and treat per MD orders.  Theora Master, RN 09/10/2016 4:12 PM

## 2016-09-10 NOTE — ED Notes (Signed)
Pt c/o of increase in her abdominal pain and is requesting more pain meds. MD aware and orders received.

## 2016-09-10 NOTE — ED Notes (Signed)
Report given to [facility] RN at [place]. [facility] here to transfer pt to [facility].

## 2016-09-10 NOTE — ED Provider Notes (Signed)
  Physical Exam  BP 140/85 (BP Location: Left Arm)   Pulse 77   Temp 97.9 F (36.6 C) (Oral)   Resp 18   Ht 5\' 1"  (1.549 m)   Wt 177 lb (80.3 kg)   LMP 09/02/2016 (Approximate)   SpO2 97%   BMI 33.44 kg/m   Physical Exam  ED Course  Procedures  MDM Patient presents with abdominal pain. Labs overall reassuring except for mild hyperglycemia. Ultrasound showed possible cholecystitis. Continued tenderness. Discussed with Dr. Johney Maine at [facility] the recommended medicine admission. However the patient's primary care doctors at family practice Center at [facility]. Discussed with Dr. Georgette Dover at [facility] from general surgery. Will admit and [place] to the family practice service. Discussed with the family practice resident and patient will be transferred for admission. Patient stated that he likes to have the patient's transferred within 2 hours.       Davonna Belling, MD 09/10/16 1130

## 2016-09-10 NOTE — ED Provider Notes (Signed)
[place] DEPT [facility] Provider Note: Georgena Spurling, MD, FACEP  CSN: [id number] MRN: [id number] ARRIVAL: 09/10/16 at [place]: [place]  Abdominal Pain   HISTORY OF PRESENT ILLNESS  Tracy Weiss is a 51 y.o. female complaining of right-sided abdominal pain since yesterday afternoon about 2:30 PM. She describes the pain is sharp. It is been waxing and waning. She was seen at [facility] about midnight this morning. Urinalysis was unremarkable except for glucose in this known diabetic. Urine pregnancy was negative. Pelvic exam demonstrated right adnexal tenderness without mass and no cervical motion tenderness. She was diagnosed with ovulatory pain and discharged home. She presents here with worsening pain which she now relates more to the right upper quadrant. She does not feel she was properly evaluated at [facility]. She has had associated nausea and vomiting. She denies fever, diarrhea, vaginal bleeding, vaginal discharge or urinary symptoms.  Consultation with the [facility] state controlled substances database reveals the patient has received 3 opioid prescriptions in the past year, most recently July 14 for 40 oxycodone/acetaminophen tablets.   Past Medical History:  Diagnosis Date  . Diabetes mellitus without complication [facility])     Past Surgical History:  Procedure Laterality Date  . CESAREAN SECTION    . tubal ligaton reversal  2017    Family History  Problem Relation Age of Onset  . Cancer Mother     Metastatic with Unknown Primary; Stomach was involved  . Diabetes Father   . Heart disease Father     Social History  Substance Use Topics  . Smoking status: Former Smoker    Packs/day: 0.50    Years: 10.00    Types: Cigarettes    Start date: 09/03/1982    Quit date: 09/04/1983  . Smokeless tobacco: Never Used  . Alcohol use No    Prior to Admission medications   Medication Sig Start Date End Date Taking? Authorizing Provider    insulin aspart (NOVOLOG) 100 UNIT/ML injection Inject 15 Units into the skin 3 (three) times daily before meals. 08/13/16   Lind Covert, MD  insulin glargine (LANTUS) 100 UNIT/ML injection Inject 0.15 mLs (15 Units total) into the skin daily. 08/13/16   [place] N Rumley, DO  liraglutide 18 MG/3ML SOPN Inject 0.3 mLs (1.8 mg total) into the skin daily. 08/13/16   [place] N Rumley, DO  Pitavastatin Calcium 2 MG TABS Take 1 tablet (2 mg total) by mouth every morning. 08/22/16   Lorna Few, DO    Allergies Patient has no known allergies.   REVIEW OF SYSTEMS  Negative except as noted here or in the History of Present Illness.   PHYSICAL EXAMINATION  Initial Vital Signs Blood pressure 128/80, pulse 79, temperature 97.9 F (36.6 C), temperature source Oral, resp. rate 18, height 5\' 1"  (1.549 m), weight 177 lb (80.3 kg), last menstrual period 09/02/2016, SpO2 99 %.  Examination General: Well-developed, well-nourished female in no acute distress; appearance consistent with age of record HENT: normocephalic; atraumatic Eyes: pupils equal, round and reactive to light; extraocular muscles intact Neck: supple Heart: regular rate and rhythm Lungs: clear to auscultation bilaterally Abdomen: soft; nondistended; right-sided abdominal pain most prominent in the right upper quadrant; no masses or hepatosplenomegaly; bowel sounds present; large gallstone seen on bedside ultrasound GU: Normal external genitalia; no vaginal bleeding; no vaginal discharge; no cervical motion tenderness; mild right adnexal tenderness that does not reproduced the pain of the chief complaint Extremities: No deformity; full range  of motion; pulses normal Neurologic: Awake, alert and oriented; motor function intact in all extremities and symmetric; no facial droop Skin: Warm and dry Psychiatric: Appears uncomfortable   RESULTS  Summary of this visit's results, reviewed by myself:   EKG  Interpretation  Date/Time:    Ventricular Rate:    PR Interval:    QRS Duration:   QT Interval:    QTC Calculation:   R Axis:     Text Interpretation:        Laboratory Studies: Results for orders placed or performed during the hospital encounter of 09/10/16 (from the past 24 hour(s))  Comprehensive metabolic panel     Status: Abnormal   Collection Time: 09/10/16  4:55 AM  Result Value Ref Range   Sodium 132 (L) 135 - 145 mmol/L   Potassium 3.9 3.5 - 5.1 mmol/L   Chloride 94 (L) 101 - 111 mmol/L   CO2 27 22 - 32 mmol/L   Glucose, Bld 326 (H) 65 - 99 mg/dL   BUN 10 6 - 20 mg/dL   Creatinine, Ser 0.81 0.44 - 1.00 mg/dL   Calcium 9.8 8.9 - 10.3 mg/dL   Total Protein 7.8 6.5 - 8.1 g/dL   Albumin 4.4 3.5 - 5.0 g/dL   AST 23 15 - 41 U/L   ALT 19 14 - 54 U/L   Alkaline Phosphatase 47 38 - 126 U/L   Total Bilirubin 0.4 0.3 - 1.2 mg/dL   GFR calc non Af Amer >60 >60 mL/min   GFR calc Af Amer >60 >60 mL/min   Anion gap 11 5 - 15  Lipase, blood     Status: None   Collection Time: 09/10/16  4:55 AM  Result Value Ref Range   Lipase 24 11 - 51 U/L  CBC with Differential/Platelet     Status: None   Collection Time: 09/10/16  4:55 AM  Result Value Ref Range   WBC 9.3 4.0 - 10.5 K/uL   RBC 4.20 3.87 - 5.11 MIL/uL   Hemoglobin 12.3 12.0 - 15.0 g/dL   HCT 37.3 36.0 - 46.0 %   MCV 88.8 78.0 - 100.0 fL   MCH 29.3 26.0 - 34.0 pg   MCHC 33.0 30.0 - 36.0 g/dL   RDW 12.1 11.5 - 15.5 %   Platelets 362 150 - 400 K/uL   Neutrophils Relative % 78 %   Neutro Abs 7.2 1.7 - 7.7 K/uL   Lymphocytes Relative 17 %   Lymphs Abs 1.6 0.7 - 4.0 K/uL   Monocytes Relative 5 %   Monocytes Absolute 0.4 0.1 - 1.0 K/uL   Eosinophils Relative 0 %   Eosinophils Absolute 0.0 0.0 - 0.7 K/uL   Basophils Relative 0 %   Basophils Absolute 0.0 0.0 - 0.1 K/uL  Wet prep, genital     Status: Abnormal   Collection Time: 09/10/16  4:55 AM  Result Value Ref Range   Yeast Wet Prep HPF POC NONE SEEN NONE SEEN    Trich, Wet Prep NONE SEEN NONE SEEN   Clue Cells Wet Prep HPF POC NONE SEEN NONE SEEN   WBC, Wet Prep HPF POC MODERATE (A) NONE SEEN   Sperm NONE SEEN    Imaging Studies: US Abdomen Complete  Result Date: 09/10/2016 CLINICAL DATA:  Right upper quadrant abdominal pain, nausea and vomiting. EXAM: ABDOMEN ULTRASOUND COMPLETE COMPARISON:  None. FINDINGS: Gallbladder: Cholelithiasis present with a dominant shadowing calculus within the gallbladder lumen measuring just over 2 cm in estimated maximal  diameter. Additional nondependent echogenic shadowing likely reflects underlying adenomyomatosis. The patient was tender overlying the gallbladder during the examination. No evidence of overt gallbladder wall thickening. Potential small amount of fluid adjacent to the gallbladder. Common bile duct: Diameter: Normal caliber of 5 mm. Liver: No focal lesion identified. Within normal limits in parenchymal echogenicity. IVC: No abnormality visualized. Pancreas: Visualized portion unremarkable. Spleen: Size and appearance within normal limits. Right Kidney: Length: 10.7 cm. Echogenicity within normal limits. No mass or hydronephrosis visualized. Left Kidney: Length: 10.6 cm. Echogenicity within normal limits. No mass or hydronephrosis visualized. Abdominal aorta: No aneurysm visualized. Other findings: None. IMPRESSION: Cholelithiasis with large dominant gallstone identified by ultrasound and evidence of additional probable adenomyomatosis. Focal tenderness overlying the gallbladder was elicited during the examination and there is potentially a small amount of fluid adjacent to the gallbladder. Component of cholecystitis may be present. No evidence of biliary obstruction. Electronically Signed   By: Aletta Edouard M.D.   On: 09/10/2016 10:00    ED COURSE  Nursing notes and initial vitals signs, including pulse oximetry, reviewed.  Vitals:   09/10/16 0615 09/10/16 0651 09/10/16 0700 09/10/16 0712  BP:  96/60 129/90  129/90  Pulse: 78  80 87  Resp:    18  Temp:      TempSrc:      SpO2: 97%  96% 98%  Weight:      Height:       6:58 AM Patient resting comfortably with pain controlled by fentanyl. She is awaiting CT scan to evaluate her gallbladder; is scheduled for proximally 9 AM. Dr. Alvino Chapel will follow up and make disposition.  PROCEDURES    ED DIAGNOSES     ICD-9-CM ICD-10-CM   1. Biliary pain 574.20 K80.50        Shanon Rosser, MD 09/10/16 1008

## 2016-09-10 NOTE — H&P (Signed)
[facility] Admission History and Physical Service Pager: [phone]  Patient name: Tracy Weiss Medical record number: [id number] Date of birth: 1966-05-02 Age: 51 y.o. Gender: female  Primary Care Provider: Junie Panning, DO Consultants: Surgery Code Status: Full  Chief Complaint: Abdominal pain  Assessment and Plan: Tracy Weiss is a 51 y.o. female presenting with cholecystitis. PMH is significant for T2DM and HLD  Abdominal Pain / Cholecystitis. Exam findings and ultrasound consistent with cholelithiasis with likely cholecystitis. No red flag findings. No signs of cholangitis.  - Surgery consulted, plan for cholecystectomy in AM - IV ceftriaxone per surgery - NPO  - Zofran prn nausea - Fentanyl prn pain - Maintenance IVF  T2DM. Patient on lantus 15U daily with novolog 5-10 three times day with meals. Also on victoza 1.8mg . A1c 8.9 last month - Hold lantus while NPO - SSI ACHS  HLD. Lipid panel last month: LDL 117, HDL 77, total cholesterol 220 (10 year ASCVD risk of 3%) - Continue home pitastatin  Pseudohyponatremia. Corrects to 135. - Monitor  FEN/GI: NPO pending surgery, 1/2NS @ 75cc/hr Prophylaxis: SCDs until after surgery, then lovenox.   Disposition: Admitted pending above management.   History of Present Illness:  Tracy Weiss is a 51 y.o. female presenting with abdominal pain.  Patient started having severe lower abdominal pain with associated nausea and vomiting yesterday. She went to [facility] where she was diagnosed with ovary pain and discharged home. The pain worsened after she went home and she went back the ED a few hours later. She had an ultrasound which was concerning for possible cholecystitis. Patient was then see by surgery who recommended admission for cholecystectomy.  Patient received zofran and fentanyl in the ED which helped with her nausea and pain.  Patient denies having similar symptoms in the past. No history of  gallbladder problems. No fevers or chills. No constipation or diarrhea. No dysuria.    Review Of Systems: Per HPI  ROS  Patient Active Problem List   Diagnosis Date Noted  . Cholecystitis 09/10/2016  . History of reversal of tubal ligation 04/03/2016  . Family planning counseling 03/25/2016  . Right shoulder pain 11/08/2015  . Generalized anxiety disorder 03/30/2015  . Memory difficulties 09/01/2014  . PCOS (polycystic ovarian syndrome) 06/25/2012  . IBS (irritable bowel syndrome) 12/29/2010  . Essential hypertension, benign 07/07/2009  . OBESITY 04/13/2009  . Diabetes mellitus without complication ([place]) 0000000  . Attention deficit disorder 10/31/2006    Past Medical History: Past Medical History:  Diagnosis Date  . Diabetes mellitus without complication [facility])     Past Surgical History: Past Surgical History:  Procedure Laterality Date  . CESAREAN SECTION    . tubal ligaton reversal  2017    Social History: Social History  Substance Use Topics  . Smoking status: Former Smoker    Packs/day: 0.50    Years: 10.00    Types: Cigarettes    Start date: 09/03/1982    Quit date: 09/04/1983  . Smokeless tobacco: Never Used  . Alcohol use No   Additional social history: N/A  Please also refer to relevant sections of EMR.  Family History: Family History  Problem Relation Age of Onset  . Cancer Mother     Metastatic with Unknown Primary; Stomach was involved  . Diabetes Father   . Heart disease Father    Allergies and Medications: No Known Allergies No current facility-administered medications on file prior to encounter.    Current Outpatient Prescriptions on File  Prior to Encounter  Medication Sig Dispense Refill  . Pitavastatin Calcium 2 MG TABS Take 1 tablet (2 mg total) by mouth every morning. (Patient not taking: Reported on 09/10/2016) 90 tablet 3    Objective: BP 136/78 (BP Location: Left Arm)   Pulse 84   Temp 98.1 F (36.7 C) (Oral)   Resp 18   Ht 5'  1" (1.549 m)   Wt 176 lb (79.8 kg)   LMP 09/02/2016 (Approximate)   SpO2 99%   BMI 33.25 kg/m  Exam: General: 51 yo woman appearing in pain, lying in hospital bed. Non toxic Eyes: PERRL, EOMI ENTM: MMM, OP clear Neck: FROM, supple Cardiovascular: RRR, no murmurs Respiratory: NWOB, CTAB Gastrointestinal: Obese, S, tender to palpation in RUQ. MSK: No cyanosis or edema Derm: Warm, dry Neuro: Alert and oriented. Moves all extremities spontaneously Psych: Appropriate mood and affect.  Labs and Imaging: CBC BMET   Recent Labs Lab 09/10/16 0455  WBC 9.3  HGB 12.3  HCT 37.3  PLT 362    Recent Labs Lab 09/10/16 1902  NA 133*  K 3.7  CL 99*  CO2 25  BUN <5*  CREATININE 0.84  GLUCOSE 246*  CALCIUM 9.3     Pregnancy test negative  US Abdomen Complete  Result Date: 09/10/2016 CLINICAL DATA:  Right upper quadrant abdominal pain, nausea and vomiting. EXAM: ABDOMEN ULTRASOUND COMPLETE COMPARISON:  None. FINDINGS: Gallbladder: Cholelithiasis present with a dominant shadowing calculus within the gallbladder lumen measuring just over 2 cm in estimated maximal diameter. Additional nondependent echogenic shadowing likely reflects underlying adenomyomatosis. The patient was tender overlying the gallbladder during the examination. No evidence of overt gallbladder wall thickening. Potential small amount of fluid adjacent to the gallbladder. Common bile duct: Diameter: Normal caliber of 5 mm. Liver: No focal lesion identified. Within normal limits in parenchymal echogenicity. IVC: No abnormality visualized. Pancreas: Visualized portion unremarkable. Spleen: Size and appearance within normal limits. Right Kidney: Length: 10.7 cm. Echogenicity within normal limits. No mass or hydronephrosis visualized. Left Kidney: Length: 10.6 cm. Echogenicity within normal limits. No mass or hydronephrosis visualized. Abdominal aorta: No aneurysm visualized. Other findings: None. IMPRESSION: Cholelithiasis with  large dominant gallstone identified by ultrasound and evidence of additional probable adenomyomatosis. Focal tenderness overlying the gallbladder was elicited during the examination and there is potentially a small amount of fluid adjacent to the gallbladder. Component of cholecystitis may be present. No evidence of biliary obstruction. Electronically Signed   By: Aletta Edouard M.D.   On: 09/10/2016 10:00   Vivi Barrack, MD 09/10/2016, 8:29 PM PGY-3, [place] [facility] pager: [phone], text pages welcome

## 2016-09-10 NOTE — Consult Note (Signed)
Reason for Consult:  Abdominal pain, symptomatic cholelithiasis, possible early cholecystitis Referring Physician: Christianne Borrow PCP:  Junie Panning, DO  Tracy Weiss is an 51 y.o. female.  HPI: Pt seen in ED at [facility] hospital yesterday and diagnosed with ovulatory pain, she is back again today.  Work up show large gallstone identified by ultrasound and evidence of additional probable adenomyomatosis. Focal tenderness overlying the gallbladder was elicited during the examination and there is potentially a small amount of fluid adjacent to the gallbladder. Component of cholecystitis may be present. No evidence of biliary obstruction. Her glucose is 326.  She has had issues with diabetes and non compliance.  She will be admitted by Medicine and when her glucose is controlled we will remove her gallbladder.   Past Medical History:  Diagnosis Date  . Diabetes mellitus without complication [facility])     Past Surgical History:  Procedure Laterality Date  . CESAREAN SECTION    . tubal ligaton reversal  2017    Family History  Problem Relation Age of Onset  . Cancer Mother     Metastatic with Unknown Primary; Stomach was involved  . Diabetes Father   . Heart disease Father     Social History:  reports that she quit smoking about 33 years ago. Her smoking use included Cigarettes. She started smoking about 34 years ago. She has a 5.00 pack-year smoking history. She has never used smokeless tobacco. She reports that she does not drink alcohol or use drugs.  Allergies: No Known Allergies  Prior to Admission medications   Medication Sig Start Date End Date Taking? Authorizing Provider  insulin aspart (NOVOLOG) 100 UNIT/ML injection Inject 15 Units into the skin 3 (three) times daily before meals. 08/13/16   Lind Covert, MD  insulin glargine (LANTUS) 100 UNIT/ML injection Inject 0.15 mLs (15 Units total) into the skin daily. 08/13/16   [place] N Rumley, DO  liraglutide 18 MG/3ML SOPN Inject  0.3 mLs (1.8 mg total) into the skin daily. 08/13/16   [place] N Rumley, DO  Pitavastatin Calcium 2 MG TABS Take 1 tablet (2 mg total) by mouth every morning. 08/22/16   Lorna Few, DO     Results for orders placed or performed during the hospital encounter of 09/10/16 (from the past 48 hour(s))  Comprehensive metabolic panel     Status: Abnormal   Collection Time: 09/10/16  4:55 AM  Result Value Ref Range   Sodium 132 (L) 135 - 145 mmol/L   Potassium 3.9 3.5 - 5.1 mmol/L   Chloride 94 (L) 101 - 111 mmol/L   CO2 27 22 - 32 mmol/L   Glucose, Bld 326 (H) 65 - 99 mg/dL   BUN 10 6 - 20 mg/dL   Creatinine, Ser 0.81 0.44 - 1.00 mg/dL   Calcium 9.8 8.9 - 10.3 mg/dL   Total Protein 7.8 6.5 - 8.1 g/dL   Albumin 4.4 3.5 - 5.0 g/dL   AST 23 15 - 41 U/L   ALT 19 14 - 54 U/L   Alkaline Phosphatase 47 38 - 126 U/L   Total Bilirubin 0.4 0.3 - 1.2 mg/dL   GFR calc non Af Amer >60 >60 mL/min   GFR calc Af Amer >60 >60 mL/min    Comment: (NOTE) The eGFR has been calculated using the CKD EPI equation. This calculation has not been validated in all clinical situations. eGFR's persistently <60 mL/min signify possible Chronic Kidney Disease.    Anion gap 11 5 - 15  Lipase, blood     Status: None   Collection Time: 09/10/16  4:55 AM  Result Value Ref Range   Lipase 24 11 - 51 U/L  CBC with Differential/Platelet     Status: None   Collection Time: 09/10/16  4:55 AM  Result Value Ref Range   WBC 9.3 4.0 - 10.5 K/uL   RBC 4.20 3.87 - 5.11 MIL/uL   Hemoglobin 12.3 12.0 - 15.0 g/dL   HCT 37.3 36.0 - 46.0 %   MCV 88.8 78.0 - 100.0 fL   MCH 29.3 26.0 - 34.0 pg   MCHC 33.0 30.0 - 36.0 g/dL   RDW 12.1 11.5 - 15.5 %   Platelets 362 150 - 400 K/uL   Neutrophils Relative % 78 %   Neutro Abs 7.2 1.7 - 7.7 K/uL   Lymphocytes Relative 17 %   Lymphs Abs 1.6 0.7 - 4.0 K/uL   Monocytes Relative 5 %   Monocytes Absolute 0.4 0.1 - 1.0 K/uL   Eosinophils Relative 0 %   Eosinophils Absolute 0.0 0.0 -  0.7 K/uL   Basophils Relative 0 %   Basophils Absolute 0.0 0.0 - 0.1 K/uL  Wet prep, genital     Status: Abnormal   Collection Time: 09/10/16  4:55 AM  Result Value Ref Range   Yeast Wet Prep HPF POC NONE SEEN NONE SEEN   Trich, Wet Prep NONE SEEN NONE SEEN   Clue Cells Wet Prep HPF POC NONE SEEN NONE SEEN   WBC, Wet Prep HPF POC MODERATE (A) NONE SEEN   Sperm NONE SEEN   POC CBG, ED     Status: Abnormal   Collection Time: 09/10/16 11:12 AM  Result Value Ref Range   Glucose-Capillary 273 (H) 65 - 99 mg/dL    US Abdomen Complete  Result Date: 09/10/2016 CLINICAL DATA:  Right upper quadrant abdominal pain, nausea and vomiting. EXAM: ABDOMEN ULTRASOUND COMPLETE COMPARISON:  None. FINDINGS: Gallbladder: Cholelithiasis present with a dominant shadowing calculus within the gallbladder lumen measuring just over 2 cm in estimated maximal diameter. Additional nondependent echogenic shadowing likely reflects underlying adenomyomatosis. The patient was tender overlying the gallbladder during the examination. No evidence of overt gallbladder wall thickening. Potential small amount of fluid adjacent to the gallbladder. Common bile duct: Diameter: Normal caliber of 5 mm. Liver: No focal lesion identified. Within normal limits in parenchymal echogenicity. IVC: No abnormality visualized. Pancreas: Visualized portion unremarkable. Spleen: Size and appearance within normal limits. Right Kidney: Length: 10.7 cm. Echogenicity within normal limits. No mass or hydronephrosis visualized. Left Kidney: Length: 10.6 cm. Echogenicity within normal limits. No mass or hydronephrosis visualized. Abdominal aorta: No aneurysm visualized. Other findings: None. IMPRESSION: Cholelithiasis with large dominant gallstone identified by ultrasound and evidence of additional probable adenomyomatosis. Focal tenderness overlying the gallbladder was elicited during the examination and there is potentially a small amount of fluid adjacent to  the gallbladder. Component of cholecystitis may be present. No evidence of biliary obstruction. Electronically Signed   By: Aletta Edouard M.D.   On: 09/10/2016 10:00    Review of Systems  Constitutional: Negative.   HENT: Negative.   Eyes: Negative.   Respiratory: Negative.   Cardiovascular: Negative.   Gastrointestinal: Positive for abdominal pain, nausea and vomiting. Negative for blood in stool, constipation, diarrhea, heartburn and melena.  Genitourinary: Negative.   Musculoskeletal: Negative.   Skin: Negative.   Neurological: Negative.   Endo/Heme/Allergies: Negative.   Psychiatric/Behavioral: The patient is nervous/anxious.    Blood pressure 129/74,  pulse 82, temperature 97.9 F (36.6 C), temperature source Oral, resp. rate 18, height _0  (1.549 m), weight 80.3 kg (177 lb), last menstrual period 09/02/2016, SpO2 97 %. Physical Exam  Constitutional: She is oriented to person, place, and time. She appears well-developed and well-nourished. No distress.  HENT:  Head: Normocephalic and atraumatic.  Mouth/Throat: No oropharyngeal exudate.  Eyes: Right eye exhibits no discharge. Left eye exhibits discharge. No scleral icterus.  Neck: Normal range of motion. Neck supple. No JVD present. No tracheal deviation present. No thyromegaly present.  Cardiovascular: Normal rate, regular rhythm, normal heart sounds and intact distal pulses.   No murmur heard. Respiratory: Effort normal and breath sounds normal. No respiratory distress. She has no wheezes. She has no rales. She exhibits no tenderness.  GI: Soft. Bowel sounds are normal. She exhibits no distension and no mass. There is tenderness (RUQ > RLQ, some tenderness left side too). There is no rebound and no guarding.  Musculoskeletal: She exhibits no edema, tenderness or deformity.  Lymphadenopathy:    She has no cervical adenopathy.  Neurological: She is alert and oriented to person, place, and time. No cranial nerve deficit.   Skin: Skin is warm and dry. No rash noted. She is not diaphoretic. No erythema. No pallor.  Psychiatric: She has a normal mood and affect. Her behavior is normal. Judgment and thought content normal.    Assessment/Plan: Abdominal pain, nausea and vomiting Symptomatic cholelithiasis, possible cholecystitis AODM ID  Plan:  If her medical issues and glucose are controlled she may be able to go forward with cholecystectomy tomorrow.  I would recommend placing her on Rocephin tonight, NPO after MN, recheck labs in AM.  We will see in AM.Defer other medical management to Primary service.  Daymien Goth 09/10/2016, 2:00 PM

## 2016-09-10 NOTE — ED Triage Notes (Addendum)
Pt states that she was seen at [facility] for abd pain and was d/c home with a diagnosis of ovulation. States pain has never decreased. Describes as sharp and constant to upper abdominal area. States she still has her gallbladder. C/o nausea. Denies vomiting. Last ate at 2pm which was pasta. Denies any diarrhea or fevers.

## 2016-09-11 ENCOUNTER — Encounter (HOSPITAL_COMMUNITY)
Admission: EM | Disposition: A | Discharge status: Home / Self Care | Payer: Self-pay | Source: Home / Self Care | Attending: Family Medicine

## 2016-09-11 ENCOUNTER — Inpatient Hospital Stay (HOSPITAL_COMMUNITY): Payer: Self-pay | Admitting: Certified Registered"

## 2016-09-11 ENCOUNTER — Inpatient Hospital Stay (HOSPITAL_COMMUNITY): Payer: Self-pay

## 2016-09-11 ENCOUNTER — Encounter (HOSPITAL_COMMUNITY): Payer: Self-pay | Admitting: General Practice

## 2016-09-11 DIAGNOSIS — E119 Type 2 diabetes mellitus without complications: Secondary | ICD-10-CM

## 2016-09-11 DIAGNOSIS — K810 Acute cholecystitis: Secondary | ICD-10-CM

## 2016-09-11 DIAGNOSIS — Z794 Long term (current) use of insulin: Secondary | ICD-10-CM

## 2016-09-11 HISTORY — PX: CHOLECYSTECTOMY: SHX55

## 2016-09-11 LAB — BASIC METABOLIC PANEL
Anion gap: 6 (ref 5–15)
BUN: <5 mg/dL — ABNORMAL LOW (ref 6–20)
CO2: 30 mmol/L (ref 22–32)
Calcium: 9.3 mg/dL (ref 8.9–10.3)
Chloride: 99 mmol/L — ABNORMAL LOW (ref 101–111)
Creatinine, Ser: 0.88 mg/dL (ref 0.44–1.00)
GFR calc Af Amer: >60 mL/min (ref >60)
GFR calc non Af Amer: >60 mL/min (ref >60)
Glucose, Bld: 232 mg/dL — ABNORMAL HIGH (ref 65–99)
Potassium: 3.9 mmol/L (ref 3.5–5.1)
Sodium: 135 mmol/L (ref 135–145)

## 2016-09-11 LAB — GLUCOSE, CAPILLARY
Glucose-Capillary: 218 mg/dL — ABNORMAL HIGH (ref 65–99)
Glucose-Capillary: 179 mg/dL — ABNORMAL HIGH (ref 65–99)
Glucose-Capillary: 245 mg/dL — ABNORMAL HIGH (ref 65–99)
Glucose-Capillary: 245 mg/dL — ABNORMAL HIGH (ref 65–99)
Glucose-Capillary: 256 mg/dL — ABNORMAL HIGH (ref 65–99)
Glucose-Capillary: 212 mg/dL — ABNORMAL HIGH (ref 65–99)

## 2016-09-11 LAB — CBC
HCT: 35.5 % — ABNORMAL LOW (ref 36.0–46.0)
Hemoglobin: 11.7 g/dL — ABNORMAL LOW (ref 12.0–15.0)
MCH: 29.6 pg (ref 26.0–34.0)
MCHC: 33 g/dL (ref 30.0–36.0)
MCV: 89.9 fL (ref 78.0–100.0)
Platelets: 305 10*3/uL (ref 150–400)
RBC: 3.95 MIL/uL (ref 3.87–5.11)
RDW: 12.9 % (ref 11.5–15.5)
WBC: 10.3 10*3/uL (ref 4.0–10.5)

## 2016-09-11 LAB — SURGICAL PCR SCREEN
MRSA, PCR: NEGATIVE
Staphylococcus aureus: NEGATIVE

## 2016-09-11 SURGERY — LAPAROSCOPIC CHOLECYSTECTOMY WITH INTRAOPERATIVE CHOLANGIOGRAM
Anesthesia: General | Site: Abdomen

## 2016-09-11 MED ORDER — FENTANYL CITRATE (PF) 100 MCG/2ML IJ SOLN
INTRAMUSCULAR | Status: DC | PRN
  Administered 2016-09-11: 100 ug via INTRAVENOUS
  Administered 2016-09-11 (×2): 50 ug via INTRAVENOUS

## 2016-09-11 MED ORDER — PROPOFOL 10 MG/ML IV BOLUS
INTRAVENOUS | Status: DC | PRN
  Administered 2016-09-11: 150 mg via INTRAVENOUS

## 2016-09-11 MED ORDER — PROMETHAZINE HCL 25 MG/ML IJ SOLN: 6.2500 mg | INTRAMUSCULAR | Status: DC | PRN

## 2016-09-11 MED ORDER — 0.9 % SODIUM CHLORIDE (POUR BTL) OPTIME
TOPICAL | Status: DC | PRN
  Administered 2016-09-11: 1000 mL

## 2016-09-11 MED ORDER — IOPAMIDOL (ISOVUE-300) INJECTION 61%
INTRAVENOUS | Status: AC
  Filled 2016-09-11: qty 50

## 2016-09-11 MED ORDER — OXYCODONE-ACETAMINOPHEN 5-325 MG PO TABS
1.0000 | ORAL_TABLET | ORAL | Status: DC | PRN
  Administered 2016-09-11 – 2016-09-12 (×3): 2 via ORAL
  Filled 2016-09-11 (×3): qty 2

## 2016-09-11 MED ORDER — INSULIN ASPART 100 UNIT/ML ~~LOC~~ SOLN
6.0000 [IU] | Freq: Once | SUBCUTANEOUS | Status: AC
  Administered 2016-09-11: 6 [IU] via SUBCUTANEOUS

## 2016-09-11 MED ORDER — LIDOCAINE 2% (20 MG/ML) 5 ML SYRINGE
INTRAMUSCULAR | Status: AC
  Filled 2016-09-11: qty 5

## 2016-09-11 MED ORDER — SODIUM CHLORIDE 0.9 % IV SOLN
INTRAVENOUS | Status: DC | PRN
  Administered 2016-09-11: 100 mL

## 2016-09-11 MED ORDER — PROPOFOL 10 MG/ML IV BOLUS
INTRAVENOUS | Status: AC
  Filled 2016-09-11: qty 20

## 2016-09-11 MED ORDER — SCOPOLAMINE 1 MG/3DAYS TD PT72
MEDICATED_PATCH | TRANSDERMAL | Status: AC
  Filled 2016-09-11: qty 1

## 2016-09-11 MED ORDER — MIDAZOLAM HCL 5 MG/5ML IJ SOLN
INTRAMUSCULAR | Status: DC | PRN
  Administered 2016-09-11: 2 mg via INTRAVENOUS

## 2016-09-11 MED ORDER — SUGAMMADEX SODIUM 200 MG/2ML IV SOLN
INTRAVENOUS | Status: DC | PRN
  Administered 2016-09-11: 200 mg via INTRAVENOUS

## 2016-09-11 MED ORDER — FENTANYL CITRATE (PF) 100 MCG/2ML IJ SOLN
INTRAMUSCULAR | Status: AC
  Filled 2016-09-11: qty 4

## 2016-09-11 MED ORDER — FENTANYL CITRATE (PF) 100 MCG/2ML IJ SOLN
INTRAMUSCULAR | Status: AC
  Filled 2016-09-11: qty 2

## 2016-09-11 MED ORDER — MIDAZOLAM HCL 2 MG/2ML IJ SOLN
INTRAMUSCULAR | Status: AC
  Filled 2016-09-11: qty 2

## 2016-09-11 MED ORDER — ROCURONIUM BROMIDE 50 MG/5ML IV SOSY
PREFILLED_SYRINGE | INTRAVENOUS | Status: AC
  Filled 2016-09-11: qty 5

## 2016-09-11 MED ORDER — FENTANYL CITRATE (PF) 100 MCG/2ML IJ SOLN
25.0000 ug | INTRAMUSCULAR | Status: DC | PRN
  Administered 2016-09-11 (×2): 25 ug via INTRAVENOUS

## 2016-09-11 MED ORDER — INSULIN ASPART 100 UNIT/ML ~~LOC~~ SOLN
SUBCUTANEOUS | Status: AC
  Filled 2016-09-11: qty 1

## 2016-09-11 MED ORDER — ROCURONIUM BROMIDE 100 MG/10ML IV SOLN
INTRAVENOUS | Status: DC | PRN
  Administered 2016-09-11: 50 mg via INTRAVENOUS

## 2016-09-11 MED ORDER — SODIUM CHLORIDE 0.9 % IR SOLN
Status: DC | PRN
  Administered 2016-09-11: 1000 mL

## 2016-09-11 MED ORDER — BUPIVACAINE HCL (PF) 0.25 % IJ SOLN
INTRAMUSCULAR | Status: AC
  Filled 2016-09-11: qty 30

## 2016-09-11 MED ORDER — LIDOCAINE HCL (CARDIAC) 20 MG/ML IV SOLN
INTRAVENOUS | Status: DC | PRN
  Administered 2016-09-11: 80 mg via INTRAVENOUS

## 2016-09-11 MED ORDER — LACTATED RINGERS IV SOLN
INTRAVENOUS | Status: DC
  Administered 2016-09-11 – 2016-09-12 (×3): via INTRAVENOUS

## 2016-09-11 MED ORDER — BUPIVACAINE HCL (PF) 0.25 % IJ SOLN
INTRAMUSCULAR | Status: DC | PRN
  Administered 2016-09-11: 30 mL

## 2016-09-11 SURGICAL SUPPLY — 53 items
APL SKNCLS STERI-STRIP NONHPOA (GAUZE/BANDAGES/DRESSINGS) ×1
APPLIER CLIP ROT 10 11.4 M/L (STAPLE) ×3
APR CLP MED LRG 11.4X10 (STAPLE) ×1
BAG SPEC RTRVL 10 TROC 200 (ENDOMECHANICALS) ×1
BAG SPEC RTRVL LRG 6X4 10 (ENDOMECHANICALS) ×1
BENZOIN TINCTURE PRP APPL 2/3 (GAUZE/BANDAGES/DRESSINGS) ×3 IMPLANT
BLADE SURG ROTATE 9660 (MISCELLANEOUS) IMPLANT
CANISTER SUCTION 2500CC (MISCELLANEOUS) ×3 IMPLANT
CHLORAPREP W/TINT 26ML (MISCELLANEOUS) ×3 IMPLANT
CLIP APPLIE ROT 10 11.4 M/L (STAPLE) ×1 IMPLANT
CLOSURE WOUND 1/2 X4 (GAUZE/BANDAGES/DRESSINGS) ×1
COVER MAYO STAND STRL (DRAPES) ×3 IMPLANT
COVER SURGICAL LIGHT HANDLE (MISCELLANEOUS) ×3 IMPLANT
DRAPE C-ARM 42X72 X-RAY (DRAPES) ×3 IMPLANT
DRSG TEGADERM 2-3/8X2-3/4 SM (GAUZE/BANDAGES/DRESSINGS) ×5 IMPLANT
DRSG TEGADERM 4X4.75 (GAUZE/BANDAGES/DRESSINGS) ×3 IMPLANT
ELECT REM PT RETURN 9FT ADLT (ELECTROSURGICAL) ×3
ELECTRODE REM PT RTRN 9FT ADLT (ELECTROSURGICAL) ×1 IMPLANT
FILTER SMOKE EVAC LAPAROSHD (FILTER) ×3 IMPLANT
GAUZE SPONGE 2X2 8PLY STRL LF (GAUZE/BANDAGES/DRESSINGS) ×1 IMPLANT
GLOVE BIO SURGEON STRL SZ7 (GLOVE) ×3 IMPLANT
GLOVE BIO SURGEON STRL SZ7.5 (GLOVE) ×4 IMPLANT
GLOVE BIO SURGEON STRL SZ8 (GLOVE) ×2 IMPLANT
GLOVE BIOGEL PI IND STRL 7.5 (GLOVE) ×1 IMPLANT
GLOVE BIOGEL PI IND STRL 8.5 (GLOVE) IMPLANT
GLOVE BIOGEL PI INDICATOR 7.5 (GLOVE) ×4
GLOVE BIOGEL PI INDICATOR 8.5 (GLOVE) ×2
GOWN STRL REUS W/ TWL LRG LVL3 (GOWN DISPOSABLE) ×3 IMPLANT
GOWN STRL REUS W/ TWL XL LVL3 (GOWN DISPOSABLE) IMPLANT
GOWN STRL REUS W/TWL LRG LVL3 (GOWN DISPOSABLE) ×6
GOWN STRL REUS W/TWL XL LVL3 (GOWN DISPOSABLE) ×3
KIT BASIN OR (CUSTOM PROCEDURE TRAY) ×3 IMPLANT
KIT ROOM TURNOVER OR (KITS) ×3 IMPLANT
NS IRRIG 1000ML POUR BTL (IV SOLUTION) ×3 IMPLANT
PAD ARMBOARD 7.5X6 YLW CONV (MISCELLANEOUS) ×3 IMPLANT
POUCH RETRIEVAL ECOSAC 10 (ENDOMECHANICALS) IMPLANT
POUCH RETRIEVAL ECOSAC 10MM (ENDOMECHANICALS) ×2
POUCH SPECIMEN RETRIEVAL 10MM (ENDOMECHANICALS) ×3 IMPLANT
SCISSORS LAP 5X35 DISP (ENDOMECHANICALS) ×3 IMPLANT
SET CHOLANGIOGRAPH 5 50 .035 (SET/KITS/TRAYS/PACK) ×3 IMPLANT
SET IRRIG TUBING LAPAROSCOPIC (IRRIGATION / IRRIGATOR) ×3 IMPLANT
SLEEVE ENDOPATH XCEL 5M (ENDOMECHANICALS) ×3 IMPLANT
SPECIMEN JAR MEDIUM (MISCELLANEOUS) ×2 IMPLANT
SPONGE GAUZE 2X2 STER 10/PKG (GAUZE/BANDAGES/DRESSINGS) ×2
STRIP CLOSURE SKIN 1/2X4 (GAUZE/BANDAGES/DRESSINGS) ×2 IMPLANT
SUT MNCRL AB 4-0 PS2 18 (SUTURE) ×3 IMPLANT
TOWEL OR 17X24 6PK STRL BLUE (TOWEL DISPOSABLE) ×3 IMPLANT
TOWEL OR 17X26 10 PK STRL BLUE (TOWEL DISPOSABLE) ×1 IMPLANT
TRAY LAPAROSCOPIC MC (CUSTOM PROCEDURE TRAY) ×3 IMPLANT
TROCAR XCEL BLUNT TIP 100MML (ENDOMECHANICALS) ×3 IMPLANT
TROCAR XCEL NON-BLD 11X100MML (ENDOMECHANICALS) ×3 IMPLANT
TROCAR XCEL NON-BLD 5MMX100MML (ENDOMECHANICALS) ×3 IMPLANT
TUBING INSUFFLATION (TUBING) ×3 IMPLANT

## 2016-09-11 NOTE — Progress Notes (Signed)
Family Medicine Teaching Service Daily Progress Note Intern Pager: [phone]  Patient name: Tracy Weiss Medical record number: [id number] Date of birth: 12/05/1965 Age: 50 y.o. Gender: female  Primary Care Provider: Junie Panning, DO Consultants: Surgery Code Status: FULL  Pt Overview and Major Events to Date:  09/10/16 Admitted for symptomatic cholecystitis  Assessment and Plan: Tracy Weiss is a 51 y.o. female presenting with cholecystitis. PMH is significant for T2DM and HLD  Abdominal Pain / Cholecystitis. Exam findings and ultrasound consistent with cholelithiasis with likely cholecystitis. No red flag findings. No signs of cholangitis.  - Surgery consulted, plan for cholecystectomy today - IV ceftriaxone per surgery - NPO  - Zofran prn nausea - Fentanyl prn pain - Maintenance IVF  T2DM. Patient on lantus 15U daily with novolog 5-10 three times day with meals. Also on victoza 1.8mg . A1c 8.9 last month - Hold lantus while NPO, will restart after surgery when patient eating - SSI ACHS  HLD. Lipid panel last month 08/13/16: LDL 117, HDL 77, total cholesterol 220 (10 year ASCVD risk of 3%). Patient has not been taking home med pitavastatin - Started pravastatin 40mg  daily on admit, will need to continue this medication on DC.  Pseudohyponatremia. Corrects to 135. - Monitor  FEN/GI: NPO pending surgery, 1/2NS @ 75cc/hr Prophylaxis: SCDs until after surgery, then lovenox.   Disposition: pending medical improvement  Subjective:  Feels well this morning, pain is currently well controlled. Looking forward to surgery today.  Objective: Temp:  [97.9 F (36.6 C)-98.5 F (36.9 C)] 98.5 F (36.9 C) (01/09 0555) Pulse Rate:  [71-88] 84 (01/09 0555) Resp:  [17-18] 17 (01/09 0555) BP: (121-140)/(64-90) 122/73 (01/09 0555) SpO2:  [93 %-99 %] 98 % (01/09 0555) Weight:  [79.8 kg (176 lb)] 79.8 kg (176 lb) (01/08 1607) Physical Exam: General: Lying in bed comfortably, in no  distress Cardiovascular: RRR, no murmurs Respiratory: CTAB, normal effort on room air Abdomen: tender to palpation over RUQ. Soft, nondistended, + bowel sounds Extremities: no cyanosis or edema  Laboratory:  Recent Labs Lab 09/10/16 0455  WBC 9.3  HGB 12.3  HCT 37.3  PLT 362    Recent Labs Lab 09/10/16 0455 09/10/16 1902  NA 132* 133*  K 3.9 3.7  CL 94* 99*  CO2 27 25  BUN 10 <5*  CREATININE 0.81 0.84  CALCIUM 9.8 9.3  PROT 7.8 6.5  BILITOT 0.4 0.8  ALKPHOS 47 45  ALT 19 20  AST 23 21  GLUCOSE 326* 246*    Imaging/Diagnostic Tests: US Abdomen Complete  Result Date: 09/10/2016 CLINICAL DATA:  Right upper quadrant abdominal pain, nausea and vomiting. EXAM: ABDOMEN ULTRASOUND COMPLETE COMPARISON:  None. FINDINGS: Gallbladder: Cholelithiasis present with a dominant shadowing calculus within the gallbladder lumen measuring just over 2 cm in estimated maximal diameter. Additional nondependent echogenic shadowing likely reflects underlying adenomyomatosis. The patient was tender overlying the gallbladder during the examination. No evidence of overt gallbladder wall thickening. Potential small amount of fluid adjacent to the gallbladder. Common bile duct: Diameter: Normal caliber of 5 mm. Liver: No focal lesion identified. Within normal limits in parenchymal echogenicity. IVC: No abnormality visualized. Pancreas: Visualized portion unremarkable. Spleen: Size and appearance within normal limits. Right Kidney: Length: 10.7 cm. Echogenicity within normal limits. No mass or hydronephrosis visualized. Left Kidney: Length: 10.6 cm. Echogenicity within normal limits. No mass or hydronephrosis visualized. Abdominal aorta: No aneurysm visualized. Other findings: None. IMPRESSION: Cholelithiasis with large dominant gallstone identified by ultrasound and evidence of additional probable adenomyomatosis. Focal  tenderness overlying the gallbladder was elicited during the examination and there is  potentially a small amount of fluid adjacent to the gallbladder. Component of cholecystitis may be present. No evidence of biliary obstruction. Electronically Signed   By: Aletta Edouard M.D.   On: 09/10/2016 10:00    Bufford Lope, DO 09/11/2016, 6:59 AM PGY-1, [place] Intern pager: [phone], text pages welcome

## 2016-09-11 NOTE — Progress Notes (Signed)
Subjective: Patient is more comfortable today.  No pain medicine since midnight.  Objective: Vital signs in last 24 hours: Temp:  [97.9 F (36.6 C)-98.5 F (36.9 C)] 98.5 F (36.9 C) (01/09 0555) Pulse Rate:  [71-88] 84 (01/09 0555) Resp:  [17-18] 17 (01/09 0555) BP: (121-140)/(64-85) 122/73 (01/09 0555) SpO2:  [93 %-99 %] 98 % (01/09 0555) Weight:  [79.8 kg (176 lb)] 79.8 kg (176 lb) (01/08 1607) Last BM Date: 09/09/16  Intake/Output from previous day: 01/08 0701 - 01/09 0700 In: 880 [P.O.:120; I.V.:760] Out: -  Intake/Output this shift: No intake/output data recorded. WDWN in NAD Eyes:  Pupils equal, round; sclera anicteric HENT:  Oral mucosa moist; good dentition  Neck:  No masses palpated, no thyromegaly Lungs:  CTA bilaterally; normal respiratory effort CV:  Regular rate and rhythm; no murmurs; extremities well-perfused with no edema Abd:  +bowel sounds, soft, mild RUQ tenderness Skin:  Warm, dry; no sign of jaundice Psychiatric - alert and oriented x 4; calm mood and affect  Lab Results:   Recent Labs  09/10/16 0455  WBC 9.3  HGB 12.3  HCT 37.3  PLT 362   BMET  Recent Labs  09/10/16 0455 09/10/16 1902  NA 132* 133*  K 3.9 3.7  CL 94* 99*  CO2 27 25  GLUCOSE 326* 246*  BUN 10 <5*  CREATININE 0.81 0.84  CALCIUM 9.8 9.3   Hepatic Function Latest Ref Rng & Units 09/10/2016 09/10/2016 08/13/2016  Total Protein 6.5 - 8.1 g/dL 6.5 7.8 7.2  Albumin 3.5 - 5.0 g/dL 3.6 4.4 4.1  AST 15 - 41 U/L '21 23 18  ' ALT 14 - 54 U/L '20 19 14  ' Alk Phosphatase 38 - 126 U/L 45 47 54  Total Bilirubin 0.3 - 1.2 mg/dL 0.8 0.4 0.3  Bilirubin, Direct 0.0 - 0.3 mg/dL - - -    PT/INR No results for input(s): LABPROT, INR in the last 72 hours. ABG No results for input(s): PHART, HCO3 in the last 72 hours.  Invalid input(s): PCO2, PO2  Studies/Results: US Abdomen Complete  Result Date: 09/10/2016 CLINICAL DATA:  Right upper quadrant abdominal pain, nausea and vomiting.  EXAM: ABDOMEN ULTRASOUND COMPLETE COMPARISON:  None. FINDINGS: Gallbladder: Cholelithiasis present with a dominant shadowing calculus within the gallbladder lumen measuring just over 2 cm in estimated maximal diameter. Additional nondependent echogenic shadowing likely reflects underlying adenomyomatosis. The patient was tender overlying the gallbladder during the examination. No evidence of overt gallbladder wall thickening. Potential small amount of fluid adjacent to the gallbladder. Common bile duct: Diameter: Normal caliber of 5 mm. Liver: No focal lesion identified. Within normal limits in parenchymal echogenicity. IVC: No abnormality visualized. Pancreas: Visualized portion unremarkable. Spleen: Size and appearance within normal limits. Right Kidney: Length: 10.7 cm. Echogenicity within normal limits. No mass or hydronephrosis visualized. Left Kidney: Length: 10.6 cm. Echogenicity within normal limits. No mass or hydronephrosis visualized. Abdominal aorta: No aneurysm visualized. Other findings: None. IMPRESSION: Cholelithiasis with large dominant gallstone identified by ultrasound and evidence of additional probable adenomyomatosis. Focal tenderness overlying the gallbladder was elicited during the examination and there is potentially a small amount of fluid adjacent to the gallbladder. Component of cholecystitis may be present. No evidence of biliary obstruction. Electronically Signed   By: Aletta Edouard M.D.   On: 09/10/2016 10:00    Anti-infectives: Anti-infectives    Start     Dose/Rate Route Frequency Ordered Stop   09/11/16 1800  cefTRIAXone (ROCEPHIN) 2 g in dextrose 5 % 50  mL IVPB     2 g 100 mL/hr over 30 Minutes Intravenous Every 24 hours 09/10/16 1836     09/10/16 1900  cefTRIAXone (ROCEPHIN) 2 g in dextrose 5 % 50 mL IVPB     2 g 100 mL/hr over 30 Minutes Intravenous  Once 09/10/16 1837 09/10/16 2033      Assessment/Plan: Early acute cholecystitis/ symptomatic  cholelithiasis Diabetes  Plan: Proceed with laparoscopic cholecystectomy/ cholangiogram today.  The surgical procedure has been discussed with the patient.  Potential risks, benefits, alternative treatments, and expected outcomes have been explained.  All of the patient's questions at this time have been answered.  The likelihood of reaching the patient's treatment goal is good.  The patient understand the proposed surgical procedure and wishes to proceed.   LOS: 1 day    Doneisha Ivey K. 09/11/2016

## 2016-09-11 NOTE — Op Note (Signed)
Laparoscopic Cholecystectomy with IOC Procedure Note  Indications: This patient presents with symptomatic gallbladder disease and will undergo laparoscopic cholecystectomy.  Pre-operative Diagnosis: Calculus of gallbladder with acute cholecystitis, without mention of obstruction  Post-operative Diagnosis: Same  Surgeon: Guillaume Weninger K.   Assistants: None  Anesthesia: General endotracheal anesthesia  ASA Class: 2  Procedure Details  The patient was seen again in the Holding Room. The risks, benefits, complications, treatment options, and expected outcomes were discussed with the patient. The possibilities of reaction to medication, pulmonary aspiration, perforation of viscus, bleeding, recurrent infection, finding a normal gallbladder, the need for additional procedures, failure to diagnose a condition, the possible need to convert to an open procedure, and creating a complication requiring transfusion or operation were discussed with the patient. The likelihood of improving the patient's symptoms with return to their baseline status is good.  The patient and/or family concurred with the proposed plan, giving informed consent. The site of surgery properly noted. The patient was taken to Operating Room, identified as Tracy Weiss and the procedure verified as Laparoscopic Cholecystectomy with Intraoperative Cholangiogram. A Time Out was held and the above information confirmed.  Prior to the induction of general anesthesia, antibiotic prophylaxis was administered. General endotracheal anesthesia was then administered and tolerated well. After the induction, the abdomen was prepped with Chloraprep and draped in the sterile fashion. The patient was positioned in the supine position.  Local anesthetic agent was injected into the skin near the umbilicus and an incision made. We dissected down to the abdominal fascia with blunt dissection.  The fascia was incised vertically and we entered the peritoneal  cavity bluntly.  A pursestring suture of 0-Vicryl was placed around the fascial opening.  The Hasson cannula was inserted and secured with the stay suture.  Pneumoperitoneum was then created with CO2 and tolerated well without any adverse changes in the patient's vital signs. An 11-mm port was placed in the subxiphoid position.  Two 5-mm ports were placed in the right upper quadrant. All skin incisions were infiltrated with a local anesthetic agent before making the incision and placing the trocars.   We positioned the patient in reverse Trendelenburg, tilted slightly to the patient's left.  The gallbladder was identified and was very large, distended, and thickened.  We decompressed the gallbladder with the suction aspirator.  The fundus was grasped and retracted cephalad. Adhesions were lysed bluntly and with the electrocautery where indicated, taking care not to injure any adjacent organs or viscus. The infundibulum was grasped and retracted laterally, exposing the peritoneum overlying the triangle of Calot. This was then exposed in a blunt fashion. A critical view of the cystic duct and cystic artery was obtained.  The cystic duct was clearly identified and bluntly dissected circumferentially. The cystic duct was ligated with a clip distally.   An incision was made in the cystic duct and the [facility] cholangiogram catheter introduced. The catheter was secured using a clip. A cholangiogram was then obtained which showed good visualization of the distal and proximal biliary tree with no sign of filling defects or obstruction.  Contrast flowed easily into the duodenum. The catheter was then removed.   The cystic duct was then ligated with clips and divided. The cystic artery was identified, dissected free, ligated with clips and divided as well.   The gallbladder was dissected from the liver bed in retrograde fashion with the electrocautery. Some bile was spilled, but no stones were noted.  The gallbladder was  removed and placed in an  Eco sac. The liver bed was irrigated and inspected. Hemostasis was achieved with the electrocautery. Copious irrigation was utilized and was repeatedly aspirated until clear.  The gallbladder and Eco sac were then removed through the umbilical port site.  The pursestring suture was used to close the umbilical fascia.    We again inspected the right upper quadrant for hemostasis.  Pneumoperitoneum was released as we removed the trocars.  4-0 Monocryl was used to close the skin.   Benzoin, steri-strips, and clean dressings were applied. The patient was then extubated and brought to the recovery room in stable condition. Instrument, sponge, and needle counts were correct at closure and at the conclusion of the case.   Findings: Cholecystitis with Cholelithiasis  Estimated Blood Loss: less than 50 mL         Drains: none         Specimens: Gallbladder           Complications: None; patient tolerated the procedure well.         Disposition: PACU - hemodynamically stable.         Condition: stable  Tracy Weiss. Tracy Dover, MD, [facility]  General/ Trauma Surgery  09/11/2016 12:23 PM

## 2016-09-11 NOTE — Discharge Instructions (Addendum)
For your diabetes, you should take Lantus 15units in the morning, Novolog 10units with every meal, and Victoza daily. It is important to take your insulin regimen as directed every day and follow up with your PCP.  Please see below for post surgery instructions.  LAPAROSCOPIC SURGERY: POST OP INSTRUCTIONS  1. DIET: Follow a light bland diet the first 24 hours after arrival home, such as soup, liquids, crackers, etc. Be sure to include lots of fluids daily. Avoid fast food or heavy meals as your are more likely to get nauseated. Eat a low fat the next few days after surgery.  2. Take your usually prescribed home medications unless otherwise directed. 3. PAIN CONTROL:  1. Pain is best controlled by a usual combination of three different methods TOGETHER:  1. Ice/Heat 2. Over the counter pain medication 3. Prescription pain medication 2. Most patients will experience some swelling and bruising around the incisions. Ice packs or heating pads (30-60 minutes up to 6 times a day) will help. Use ice for the first few days to help decrease swelling and bruising, then switch to heat to help relax tight/sore spots and speed recovery. Some people prefer to use ice alone, heat alone, alternating between ice & heat. Experiment to what works for you. Swelling and bruising can take several weeks to resolve.  3. It is helpful to take an over-the-counter pain medication regularly for the first few weeks. Choose one of the following that works best for you:  1. Naproxen (Aleve, etc) Two 220mg  tabs twice a day 2. Ibuprofen (Advil, etc) Three 200mg  tabs four times a day (every meal & bedtime) 3. Acetaminophen (Tylenol, etc) 500-650mg  four times a day (every meal & bedtime) 4. A prescription for pain medication (such as oxycodone, hydrocodone, etc) should be given to you upon discharge. Take your pain medication as prescribed.  1. If you are having problems/concerns with the prescription medicine (does not control pain,  nausea, vomiting, rash, itching, etc), please call us [phone] to see if we need to switch you to a different pain medicine that will work better for you and/or control your side effect better. 2. If you need a refill on your pain medication, please contact your pharmacy. They will contact our office to request authorization. Prescriptions will not be filled after 5 pm or on week-ends. 4. Avoid getting constipated. Between the surgery and the pain medications, it is common to experience some constipation. Increasing fluid intake and taking a fiber supplement (such as Metamucil, Citrucel, FiberCon, MiraLax, etc) 1-2 times a day regularly will usually help prevent this problem from occurring. A mild laxative (prune juice, Milk of Magnesia, MiraLax, etc) should be taken according to package directions if there are no bowel movements after 48 hours.  5. Watch out for diarrhea. If you have many loose bowel movements, simplify your diet to bland foods & liquids for a few days. Stop any stool softeners and decrease your fiber supplement. Switching to mild anti-diarrheal medications (Kayopectate, Pepto Bismol) can help. If this worsens or does not improve, please call us. 6. Wash / shower every day. You may shower over the dressings as they are waterproof. Continue to shower over incision(s) after the dressing is off. 7. Remove your waterproof bandages 5 days after surgery. You may leave the incision open to air. You may replace a dressing/Band-Aid to cover the incision for comfort if you wish.  8. ACTIVITIES as tolerated:  1. You may resume regular (light) daily activities beginning the  next day--such as daily self-care, walking, climbing stairs--gradually increasing activities as tolerated. If you can walk 30 minutes without difficulty, it is safe to try more intense activity such as jogging, treadmill, bicycling, low-impact aerobics, swimming, etc. 2. Save the most intensive and strenuous activity for last  such as sit-ups, heavy lifting, contact sports, etc Refrain from any heavy lifting or straining until you are off narcotics for pain control.  3. DO NOT PUSH THROUGH PAIN. Let pain be your guide: If it hurts to do something, don't do it. Pain is your body warning you to avoid that activity for another week until the pain goes down. 4. You may drive when you are no longer taking prescription pain medication, you can comfortably wear a seatbelt, and you can safely maneuver your car and apply brakes. 5. You may have sexual intercourse when it is comfortable.  9. FOLLOW UP in our office  1. Please call [facility] at [phone] to set up an appointment to see your surgeon in the office for a follow-up appointment approximately 2-3 weeks after your surgery. 2. Make sure that you call for this appointment the day you arrive home to insure a convenient appointment time.      10. IF YOU HAVE DISABILITY OR FAMILY LEAVE FORMS, BRING THEM TO THE               OFFICE FOR PROCESSING.   WHEN TO CALL us [phone]:  1. Poor pain control 2. Reactions / problems with new medications (rash/itching, nausea, etc)  3. Fever over 101.5 F (38.5 C) 4. Inability to urinate 5. Nausea and/or vomiting 6. Worsening swelling or bruising 7. Continued bleeding from incision. 8. Increased pain, redness, or drainage from the incision  The clinic staff is available to answer your questions during regular business hours (8:30am-5pm). Please dont hesitate to call and ask to speak to one of our nurses for clinical concerns.  If you have a medical emergency, go to the nearest emergency room or call 911.  A surgeon from [facility] is always on call at the [facility], [place], [place], [place], [place] [address] ?  MAIN: [phone] ? TOLL FREE: [phone] ?  FAX [phone]  [email or web address]     Laparoscopic Cholecystectomy, Care After This  sheet gives you information about how to care for yourself after your procedure. Your health care provider may also give you more specific instructions. If you have problems or questions, contact your health care provider. What can I expect after the procedure? After the procedure, it is common to have:  Pain at your incision sites. You will be given medicines to control this pain.  Mild nausea or vomiting.  Bloating and possible shoulder pain from the air-like gas that was used during the procedure. Follow these instructions at home: Incision care  Follow instructions from your health care provider about how to take care of your incisions. Make sure you:  Wash your hands with soap and water before you change your bandage (dressing). If soap and water are not available, use hand sanitizer.  Change your dressing as told by your health care provider.  Leave stitches (sutures), skin glue, or adhesive strips in place. These skin closures may need to be in place for 2 weeks or longer. If adhesive strip edges start to loosen and curl up, you may trim the loose edges. Do not remove adhesive strips completely unless your health  care provider tells you to do that.  Do not take baths, swim, or use a hot tub until your health care provider approves. Ask your health care provider if you can take showers. You may only be allowed to take sponge baths for bathing.  Check your incision area every day for signs of infection. Check for:  More redness, swelling, or pain.  More fluid or blood.  Warmth.  Pus or a bad smell. Activity  Do not drive or use heavy machinery while taking prescription pain medicine.  Do not lift anything that is heavier than 10 lb (4.5 kg) until your health care provider approves.  Do not play contact sports until your health care provider approves.  Do not drive for 24 hours if you were given a medicine to help you relax (sedative).  Rest as needed. Do not return to  work or school until your health care provider approves. General instructions  Take over-the-counter and prescription medicines only as told by your health care provider.  To prevent or treat constipation while you are taking prescription pain medicine, your health care provider may recommend that you:  Drink enough fluid to keep your urine clear or pale yellow.  Take over-the-counter or prescription medicines.  Eat foods that are high in fiber, such as fresh fruits and vegetables, whole grains, and beans.  Limit foods that are high in fat and processed sugars, such as fried and sweet foods. Contact a health care provider if:  You develop a rash.  You have more redness, swelling, or pain around your incisions.  You have more fluid or blood coming from your incisions.  Your incisions feel warm to the touch.  You have pus or a bad smell coming from your incisions.  You have a fever.  One or more of your incisions breaks open. Get help right away if:  You have trouble breathing.  You have chest pain.  You have increasing pain in your shoulders.  You faint or feel dizzy when you stand.  You have severe pain in your abdomen.  You have nausea or vomiting that lasts for more than one day.  You have leg pain. This information is not intended to replace advice given to you by your health care provider. Make sure you discuss any questions you have with your health care provider. Document Released: 08/20/2005 Document Revised: 03/10/2016 Document Reviewed: 02/06/2016 Elsevier Interactive Patient Education  2017 [employer].

## 2016-09-11 NOTE — Anesthesia Procedure Notes (Signed)
Procedure Name: Intubation Date/Time: 09/11/2016 11:10 AM Performed by: Lance Coon Pre-anesthesia Checklist: Patient identified, Emergency Drugs available, Suction available, Patient being monitored and Timeout performed Patient Re-evaluated:Patient Re-evaluated prior to inductionOxygen Delivery Method: Circle system utilized Preoxygenation: Pre-oxygenation with 100% oxygen Intubation Type: IV induction Ventilation: Mask ventilation without difficulty Laryngoscope Size: Mac and 4 Grade View: Grade II Tube type: Oral Tube size: 7.0 mm Number of attempts: 3 Airway Equipment and Method: Stylet Placement Confirmation: ETT inserted through vocal cords under direct vision,  positive ETCO2 and breath sounds checked- equal and bilateral Secured at: 21 cm Tube secured with: Tape Dental Injury: Teeth and Oropharynx as per pre-operative assessment  Difficulty Due To: Difficulty was unanticipated and Difficult Airway- due to anterior larynx Comments: DL with miller 3 by self, able to visualize single arytenoid and unable to pass tube, DL x1 by Dr Gifford Shave with miller 3 unsuccessful, DL x 1 by Dr Gifford Shave with Mac 4 successful

## 2016-09-11 NOTE — Anesthesia Preprocedure Evaluation (Addendum)
Anesthesia Evaluation  Patient identified by MRN, date of birth, ID band Patient awake    Reviewed: Allergy & Precautions, NPO status , Patient's Chart, lab work & pertinent test results  Airway Mallampati: III  TM Distance: >3 FB Neck ROM: Full    Dental  (+) Teeth Intact, Dental Advisory Given   Pulmonary former smoker,    Pulmonary exam normal breath sounds clear to auscultation       Cardiovascular hypertension, Pt. on medications Normal cardiovascular exam Rhythm:Regular Rate:Normal     Neuro/Psych PSYCHIATRIC DISORDERS Anxiety negative neurological ROS     GI/Hepatic negative GI ROS, Acute cholecystitis    Endo/Other  diabetes, Type 2, Insulin DependentObesity   Renal/GU negative Renal ROS     Musculoskeletal negative musculoskeletal ROS (+)   Abdominal   Peds  Hematology  (+) Blood dyscrasia, anemia ,   Anesthesia Other Findings Day of surgery medications reviewed with the patient.  Reproductive/Obstetrics                            Anesthesia Physical Anesthesia Plan  ASA: II  Anesthesia Plan: General   Post-op Pain Management:    Induction: Intravenous  Airway Management Planned: Oral ETT  Additional Equipment:   Intra-op Plan:   Post-operative Plan: Extubation in OR  Informed Consent: I have reviewed the patients History and Physical, chart, labs and discussed the procedure including the risks, benefits and alternatives for the proposed anesthesia with the patient or authorized representative who has indicated his/her understanding and acceptance.   Dental advisory given  Plan Discussed with: CRNA  Anesthesia Plan Comments: (Risks/benefits of general anesthesia discussed with patient including risk of damage to teeth, lips, gum, and tongue, nausea/vomiting, allergic reactions to medications, and the possibility of heart attack, stroke and death.  All patient  questions answered.  Patient wishes to proceed.)        Anesthesia Quick Evaluation

## 2016-09-11 NOTE — Transfer of Care (Signed)
Immediate Anesthesia Transfer of Care Note  Patient: Tracy Weiss  Procedure(s) Performed: Procedure(s): LAPAROSCOPIC CHOLECYSTECTOMY WITH INTRAOPERATIVE CHOLANGIOGRAM (N/A)  Patient Location: PACU  Anesthesia Type:General  Level of Consciousness: awake and alert   Airway & Oxygen Therapy: Patient Spontanous Breathing and Patient connected to face mask oxygen  Post-op Assessment: Report given to RN and Post -op Vital signs reviewed and stable  Post vital signs: Reviewed and stable  Last Vitals:  Vitals:   09/11/16 0941 09/11/16 1238  BP: 112/70 (P) 138/70  Pulse: 89   Resp: 17   Temp: 37 C (P) 37.2 C    Last Pain:  Vitals:   09/11/16 0941  TempSrc: Oral  PainSc:       Patients Stated Pain Goal: 3 ([id number])  Complications: No apparent anesthesia complications

## 2016-09-11 NOTE — Anesthesia Postprocedure Evaluation (Signed)
Anesthesia Post Note  Patient: Makayli Linkenhoker  Procedure(s) Performed: Procedure(s) (LRB): LAPAROSCOPIC CHOLECYSTECTOMY WITH INTRAOPERATIVE CHOLANGIOGRAM (N/A)  Patient location during evaluation: PACU Anesthesia Type: General Level of consciousness: awake and alert Pain management: pain level controlled Vital Signs Assessment: post-procedure vital signs reviewed and stable Respiratory status: spontaneous breathing, nonlabored ventilation, respiratory function stable and patient connected to nasal cannula oxygen Cardiovascular status: blood pressure returned to baseline and stable Postop Assessment: no signs of nausea or vomiting Anesthetic complications: no       Last Vitals:  Vitals:   09/11/16 1348 09/11/16 1349  BP:    Pulse: 87   Resp: 15   Temp:  37.3 C    Last Pain:  Vitals:   09/11/16 1300  TempSrc:   PainSc: 0-No pain                 Alexcis Bicking,W. EDMOND

## 2016-09-11 NOTE — Progress Notes (Signed)
Inpatient Diabetes Program Recommendations  AACE/ADA: New Consensus Statement on Inpatient Glycemic Control (2015)  Target Ranges:  Prepandial:   less than 140 mg/dL      Peak postprandial:   less than 180 mg/dL (1-2 hours)      Critically ill patients:  140 - 180 mg/dL   Results for Tracy, Weiss (MRN [id number]) as of 09/11/2016 11:24  Ref. Range 09/10/2016 11:12 09/10/2016 15:08 09/10/2016 17:54 09/10/2016 22:01 09/11/2016 07:23 09/11/2016 10:03  Glucose-Capillary Latest Ref Range: 65 - 99 mg/dL 273 (H) 240 (H) 240 (H) 207 (H) 218 (H) 179 (H)   Review of Glycemic Control  Diabetes history: DM 2 Outpatient Diabetes medications: Lantus 15 Daily, Nvoolog 5-10 units TID, Victoza 1.8 Daily Current orders for Inpatient glycemic control: Novolog Moderate TID + HS scale  A1c 8.9 % on 08/13/16  Inpatient Diabetes Program Recommendations:   Glucose in the 200's. Post surgery, consider ordering Lantus 8 units QHS to receive tonight (half of patient's home dose).  Thanks,  Tama Headings RN, MSN, [facility] Inpatient Diabetes Coordinator Team Pager [phone] (8a-5p)

## 2016-09-12 ENCOUNTER — Encounter (HOSPITAL_COMMUNITY): Payer: Self-pay | Admitting: Surgery

## 2016-09-12 DIAGNOSIS — K8050 Calculus of bile duct without cholangitis or cholecystitis without obstruction: Secondary | ICD-10-CM

## 2016-09-12 LAB — BASIC METABOLIC PANEL
Anion gap: 8 (ref 5–15)
BUN: <5 mg/dL — ABNORMAL LOW (ref 6–20)
CO2: 28 mmol/L (ref 22–32)
Calcium: 8.9 mg/dL (ref 8.9–10.3)
Chloride: 100 mmol/L — ABNORMAL LOW (ref 101–111)
Creatinine, Ser: 0.78 mg/dL (ref 0.44–1.00)
GFR calc Af Amer: >60 mL/min (ref >60)
GFR calc non Af Amer: >60 mL/min (ref >60)
Glucose, Bld: 206 mg/dL — ABNORMAL HIGH (ref 65–99)
Potassium: 3.6 mmol/L (ref 3.5–5.1)
Sodium: 136 mmol/L (ref 135–145)

## 2016-09-12 LAB — CBC
HCT: 31.2 % — ABNORMAL LOW (ref 36.0–46.0)
Hemoglobin: 10.1 g/dL — ABNORMAL LOW (ref 12.0–15.0)
MCH: 29.4 pg (ref 26.0–34.0)
MCHC: 32.4 g/dL (ref 30.0–36.0)
MCV: 90.7 fL (ref 78.0–100.0)
Platelets: 239 10*3/uL (ref 150–400)
RBC: 3.44 MIL/uL — ABNORMAL LOW (ref 3.87–5.11)
RDW: 13.1 % (ref 11.5–15.5)
WBC: 7.9 10*3/uL (ref 4.0–10.5)

## 2016-09-12 LAB — GLUCOSE, CAPILLARY
Glucose-Capillary: 193 mg/dL — ABNORMAL HIGH (ref 65–99)
Glucose-Capillary: 244 mg/dL — ABNORMAL HIGH (ref 65–99)

## 2016-09-12 LAB — GC/CHLAMYDIA PROBE AMP (~~LOC~~) NOT AT ARMC
Chlamydia: NEGATIVE
Neisseria Gonorrhea: NEGATIVE

## 2016-09-12 MED ORDER — FLUCONAZOLE 100 MG PO TABS
150.0000 mg | ORAL_TABLET | Freq: Once | ORAL | Status: AC
  Administered 2016-09-12: 150 mg via ORAL
  Filled 2016-09-12: qty 2

## 2016-09-12 MED ORDER — OXYCODONE-ACETAMINOPHEN 5-325 MG PO TABS: 1.0000 | ORAL_TABLET | ORAL | 0 refills | Status: DC | PRN

## 2016-09-12 NOTE — Progress Notes (Signed)
Transitions of Care Pharmacy Note  Plan: Educated on insulin, victoza, and statin Addressed concerns regarding need to take a statin Recommend pravastatin 40mg  daily (inpatient med and dose) instead of pitavastatin Follow-up with PCP regarding adjusting doses --------------------------------------------- Tracy Weiss is an 51 y.o. female who presents with a chief complaint of abdominal pain. In anticipation of discharge, pharmacy has reviewed this patient's prior to admission medication history, as well as current inpatient medications listed per the [facility].  Current medication indications, dosing, frequency, and notable side effects reviewed with patient. patient verbalized understanding of current inpatient medication regimen and is aware that the After Visit Summary when presented, will represent the most accurate medication list at discharge.   Tracy Weiss expressed concerns regarding cost of pitavastatin and the need to use a statin when she doesn't have cholesterol issues. I counseled her that because of diabetes, she is at an increased risk of CV disease. Because of this increased risk, the statin has shown benefit regardless of whether you have high cholesterol numbers. I counseled her that there are cheaper statin options and recommended to the medical residents that pravastatin may be a better option.   Assessment: Understanding of regimen: good Understanding of indications: good Potential of compliance: good Barriers to Obtaining Medications: Yes  Patient instructed to contact inpatient pharmacy team with further questions or concerns if needed.    Time spent preparing for discharge counseling: 10 Time spent counseling patient: 20   Thank you for allowing pharmacy to be a part of this patient's care.  Myer Peer Grayland Ormond), PharmD  PGY1 Pharmacy Resident Pager: [phone] 09/12/2016 3:02 PM

## 2016-09-12 NOTE — Progress Notes (Signed)
Discharge home. Home discharge instruction given, no question verbalized. 

## 2016-09-12 NOTE — Progress Notes (Signed)
1 Day Post-Op  Subjective: Doing well this a.m. back on a regular low-carb diet. She reports she thinks she needs more insulin, her glucoses are still somewhat elevated. She is sore from her port sites/cholecystectomy. The umbilical site was wet and stained. We remove that dressing and Steri-Strips. Clean and redress the site. The other port sites are dry and were left with the dressing intact.  Objective: Vital signs in last 24 hours: Temp:  [98.6 F (37 C)-99.3 F (37.4 C)] 99.3 F (37.4 C) (01/10 0519) Pulse Rate:  [80-93] 85 (01/10 0519) Resp:  [13-18] 17 (01/10 0519) BP: (105-141)/(57-87) 105/57 (01/10 0519) SpO2:  [98 %-100 %] 99 % (01/10 0519) Last BM Date: 09/09/16 1160 PO Urine 700 MAXIMUM TEMPERATURE 99.3 vital signs stable. No labs this a.m. glucose 245-212 range. IOC: Negative. Intake/Output from previous day: 01/09 0701 - 01/10 0700 In: 3185.8 [P.O.:1160; I.V.:1925.8; IV Piggyback:100] Out: 715 [Urine:700; Blood:15] Intake/Output this shift: No intake/output data recorded.  General appearance: alert, cooperative and no distress GI: Soft, tender, port sites as noted above. Tolerating by mouth's well.  Lab Results:   Recent Labs  09/10/16 0455 09/11/16 0747  WBC 9.3 10.3  HGB 12.3 11.7*  HCT 37.3 35.5*  PLT 362 305    BMET  Recent Labs  09/10/16 1902 09/11/16 0747  NA 133* 135  K 3.7 3.9  CL 99* 99*  CO2 25 30  GLUCOSE 246* 232*  BUN <5* <5*  CREATININE 0.84 0.88  CALCIUM 9.3 9.3   PT/INR No results for input(s): LABPROT, INR in the last 72 hours.   Recent Labs Lab 09/10/16 0455 09/10/16 1902  AST 23 21  ALT 19 20  ALKPHOS 47 45  BILITOT 0.4 0.8  PROT 7.8 6.5  ALBUMIN 4.4 3.6     Lipase     Component Value Date/Time   LIPASE 19 09/10/2016 1902     Studies/Results: Dg Cholangiogram Operative  Result Date: 09/11/2016 CLINICAL DATA:  51 year old female undergoing laparoscopic cholecystectomy EXAM: INTRAOPERATIVE CHOLANGIOGRAM  TECHNIQUE: Cholangiographic images from the C-arm fluoroscopic device were submitted for interpretation post-operatively. Please see the procedural report for the amount of contrast and the fluoroscopy time utilized. COMPARISON:  Right upper quadrant ultrasound 09/10/2016 FINDINGS: A single cine loop obtained at the time of intraoperative cholangiogram during laparoscopic cholecystectomy demonstrates cannulation of the cystic duct remanent and opacification of the biliary tree. There is no evidence of biliary ductal dilatation, stenosis, stricture or choledocholithiasis. Contrast material passes freely through the ampulla and into the duodenum. IMPRESSION: Negative intraoperative cholangiogram. Electronically Signed   By: Jacqulynn Cadet M.D.   On: 09/11/2016 12:04   US Abdomen Complete  Result Date: 09/10/2016 CLINICAL DATA:  Right upper quadrant abdominal pain, nausea and vomiting. EXAM: ABDOMEN ULTRASOUND COMPLETE COMPARISON:  None. FINDINGS: Gallbladder: Cholelithiasis present with a dominant shadowing calculus within the gallbladder lumen measuring just over 2 cm in estimated maximal diameter. Additional nondependent echogenic shadowing likely reflects underlying adenomyomatosis. The patient was tender overlying the gallbladder during the examination. No evidence of overt gallbladder wall thickening. Potential small amount of fluid adjacent to the gallbladder. Common bile duct: Diameter: Normal caliber of 5 mm. Liver: No focal lesion identified. Within normal limits in parenchymal echogenicity. IVC: No abnormality visualized. Pancreas: Visualized portion unremarkable. Spleen: Size and appearance within normal limits. Right Kidney: Length: 10.7 cm. Echogenicity within normal limits. No mass or hydronephrosis visualized. Left Kidney: Length: 10.6 cm. Echogenicity within normal limits. No mass or hydronephrosis visualized. Abdominal aorta: No  aneurysm visualized. Other findings: None. IMPRESSION:  Cholelithiasis with large dominant gallstone identified by ultrasound and evidence of additional probable adenomyomatosis. Focal tenderness overlying the gallbladder was elicited during the examination and there is potentially a small amount of fluid adjacent to the gallbladder. Component of cholecystitis may be present. No evidence of biliary obstruction. Electronically Signed   By: Aletta Edouard M.D.   On: 09/10/2016 10:00    Medications: . cefTRIAXone (ROCEPHIN)  IV  2 g Intravenous Q24H  . insulin aspart  0-15 Units Subcutaneous TID WC  . insulin aspart  0-5 Units Subcutaneous QHS  . pravastatin  40 mg Oral q1800  . sodium chloride flush  3 mL Intravenous Q12H   . lactated ringers 50 mL/hr at 09/12/16 [id number]    Assessment/Plan Calculus of gallbladder with acute cholecystitis, without mention of obstruction at his post laparoscopic cholecystectomy with IOC, 09/11/16, Dr. Donnie Mesa Type 2 diabetes insulin-dependent FEN: Carb modified/IV fluids ID: Rocephin 2 days completed DVT: SCDs    Plan: From our standpoint we can discontinue the Rocephin. She can be discharged home when medically appropriate. Discharge instructions and follow-up for her cholecystectomy will be in the AVS. She continues Tylenol, ibuprofen and Percocet for pain control.  We will see again as needed.   LOS: 2 days    Marty Sadlowski 09/12/2016 [phone]

## 2016-09-12 NOTE — Discharge Summary (Signed)
[facility] Discharge Summary  Patient name: Tracy Weiss Medical record number: [id number] Date of birth: 1966-08-30 Age: 51 y.o. Gender: female Date of Admission: 09/10/2016  Date of Discharge: 09/12/16 Admitting Physician: Lind Covert, MD  Primary Care Provider: Junie Panning, DO Consultants: Surgery  Indication for Hospitalization: cholecystitis  Discharge Diagnoses/Problem List:  Acute cholecystitis s/p lap cholecystectomy T2DM HLD Vaginal yeast infection  Disposition: home  Discharge Condition: Stable, improved  Discharge Exam:  General: Lying in bed comfortably, in no distress Cardiovascular: RRR, no murmurs Respiratory: CTAB, normal effort on room air Abdomen: tender to palpation over RUQ and surgical incisions. Soft, nondistended, + bowel sounds Skin: multiple small incisions with dressings c/d/i Extremities: no cyanosis or edema  Brief Hospital Course:  Presented with nausea/vomiting and severe RUQ abdominal pain to [facility] ED and found to have cholelithiasis with large dominant gallstone and small amount of fluid adjacent to the gallbladder on U/S. Was evaluated by surgery and started on CTX pre-op. She had a Laparoscopic Cholecystectomy with Intraoperative Cholangiogram with removal of her gallbladder that was very large, distended, and thickened. A cholangiogram was then obtained which showed good visualization of the distal and proximal biliary tree with no sign of filling defects or obstruction. She did well post-op with good control of her incisional pain. She was deemed stable for discharge home and her home medication were restarted on discharge  Issues for Follow Up:  1. Patient to follow up with surgery s/p lap cholecystectomy 2. Restarted on home diabetic medication regimen: NovoLog 10 units with meals, Lantus 15 units in the morning, Victoza 1.8 on discharge. 3. Patient refusing pitvastatin due to cost. had not been  on recently at home. Pharmacy is recommending pravastatin as alternative and patient is asking to be sent to [facility] on [address] due to medication assistance program possible delays. Will defer to PCP.  Significant Procedures: Laparoscopic Cholecystectomy with Intraoperative Cholangiogram  Significant Labs and Imaging:   Recent Labs Lab 09/10/16 0455 09/11/16 0747 09/12/16 0743  WBC 9.3 10.3 7.9  HGB 12.3 11.7* 10.1*  HCT 37.3 35.5* 31.2*  PLT 362 305 239    Recent Labs Lab 09/10/16 0455 09/10/16 1902 09/11/16 0747 09/12/16 0743  NA 132* 133* 135 136  K 3.9 3.7 3.9 3.6  CL 94* 99* 99* 100*  CO2 27 25 30 28   GLUCOSE 326* 246* 232* 206*  BUN 10 <5* <5* <5*  CREATININE 0.81 0.84 0.88 0.78  CALCIUM 9.8 9.3 9.3 8.9  ALKPHOS 47 45  --   --   AST 23 21  --   --   ALT 19 20  --   --   ALBUMIN 4.4 3.6  --   --       Results/Tests Pending at Time of Discharge: none  Discharge Medications:  Allergies as of 09/12/2016   No Known Allergies     Medication List    TAKE these medications   ALPRAZolam 0.25 MG tablet Commonly known as:  XANAX Take 0.25 mg by mouth daily as needed for anxiety.   cycloSPORINE 0.05 % ophthalmic emulsion Commonly known as:  RESTASIS Place 1 drop into both eyes 2 (two) times daily as needed (dry eyes).   insulin aspart 100 UNIT/ML injection Commonly known as:  novoLOG Inject 5-10 Units into the skin 3 (three) times daily before meals.   insulin glargine 100 UNIT/ML injection Commonly known as:  LANTUS Inject 15 Units into the skin daily.   multivitamin  with minerals Tabs tablet Take 1 tablet by mouth daily.   oxyCODONE-acetaminophen 5-325 MG tablet Commonly known as:  PERCOCET/ROXICET Take 1 tablet by mouth every 4 (four) hours as needed for moderate pain.   Pitavastatin Calcium 2 MG Tabs Take 1 tablet (2 mg total) by mouth every morning.   VICTOZA 18 MG/3ML Sopn Generic drug:  liraglutide Inject 1.8 mg into the skin daily.        Discharge Instructions: Please refer to Patient Instructions section of EMR for full details.  Patient was counseled important signs and symptoms that should prompt return to medical care, changes in medications, dietary instructions, activity restrictions, and follow up appointments.   Follow-Up Appointments: Follow-up [facility] Follow up on 10/02/2016.   Specialty:  General Surgery Why:  Your appointment is 10/02/16 @ 11:00am. Please arrive 30 minutes prior to your appointment to fill out necessary paperwork and get checked in. Contact information: [address] [place], [place]. Schedule an appointment as soon as possible for a visit in 1 week(s).   Specialty:  Family Medicine Why:  Hospital follow up Contact information: [id number] [place] [address] [place], DO 09/12/2016, 12:24 PM PGY-1, [place]

## 2016-09-14 ENCOUNTER — Encounter: Payer: Self-pay | Admitting: Obstetrics and Gynecology

## 2016-09-14 ENCOUNTER — Telehealth: Payer: Self-pay | Admitting: Student

## 2016-09-14 ENCOUNTER — Ambulatory Visit (INDEPENDENT_AMBULATORY_CARE_PROVIDER_SITE_OTHER): Payer: Self-pay | Admitting: Obstetrics and Gynecology

## 2016-09-14 VITALS — BP 102/64 | HR 89 | Temp 98.2°F | Ht 61.0 in | Wt 175.4 lb

## 2016-09-14 DIAGNOSIS — R0602 Shortness of breath: Secondary | ICD-10-CM

## 2016-09-14 MED ORDER — BENZONATATE 100 MG PO CAPS: 100.0000 mg | ORAL_CAPSULE | Freq: Three times a day (TID) | ORAL | 0 refills | Status: DC | PRN

## 2016-09-14 MED FILL — ALPRAZolam 0.25 MG TABS: 0.25 | 10 days supply | Qty: 20 | Fill #0

## 2016-09-14 NOTE — Telephone Encounter (Signed)
Patient came in this morning questioning why she has not heard back in regards to refill on the RX alprazolam. Patient has appt. Later today and would like this addressed. Please let patient known when done. [phone]

## 2016-09-14 NOTE — Telephone Encounter (Signed)
Called patient in response to the page I received on the after hour pager. Patient had a laparoscopic cholecystectomy 3 days ago. She reports shortness of breath that has started about 15 hours ago. She denies chest pain, swelling in her legs or fever. She reports cough with greenish sputum. Denies hemoptysis. She states that she has been ambulating since she was discharged. She denies history of DVT or PE. She stated that there is no progression in her dyspnea. She reports some irritation in her throat and she is clearing her throat while she talking to me. She likes to know if she can be seen tomorrow in our clinic.  I advised patient to come to ED if she notices worsening of his symptoms or if she have chest pain, swelling in her legs or other new symptoms concerning to her. I scheduled her on SDA at 11 AM.  Patient voiced understanding and agreed to do as advised.

## 2016-09-14 NOTE — Progress Notes (Signed)
   Subjective:   Patient ID: Tracy Weiss, female    DOB: 06/15/1966, 51 y.o.   MRN: [id number]  Patient presents for Same Day Appointment  Chief Complaint  Patient presents with  . Shortness of Breath    HPI: # Dyspnea Patient complains of shortness of breath. Symptoms cosntant. Symptoms began 2 days ago. Associated symptoms include  dry cough. She denies chest pain, drainage from nose, leg pain, and edema. She has had recent gallbladder surgery.  Denies fevers, n/v. Continues to have abdominal pain s/p surgery. Breathing deeply hurts.  Review of Systems   See HPI for ROS.   History  Smoking Status  . Former Smoker  . Packs/day: 0.50  . Years: 10.00  . Types: Cigarettes  . Start date: 09/03/1982  . Quit date: 09/04/1983  Smokeless Tobacco  . Never Used    Comment: ' Patient claims she has never smoked "    Past medical history, surgical, family, and social history reviewed and updated in the EMR as appropriate.  Objective:  BP 102/64 (BP Location: Right Arm, Patient Position: Sitting, Cuff Size: Large)   Pulse 89   Temp 98.2 F (36.8 C) (Oral)   Ht 5\' 1"  (1.549 m)   Wt 175 lb 6.4 oz (79.6 kg)   LMP 09/02/2016 (Approximate)   SpO2 93%   BMI 33.14 kg/m  Vitals and nursing note reviewed  Physical Exam  Constitutional: She is well-developed, well-nourished, and in no distress.  HENT:  Mouth/Throat: Oropharynx is clear and moist.  Cardiovascular: Normal rate, regular rhythm and normal heart sounds.   Pulmonary/Chest: Effort normal and breath sounds normal. She has no wheezes. She has no rales.  Abdominal: She exhibits no distension.  Has five incisions on abdomen that have bandage. No drainage or blood seen through bandages.   Musculoskeletal: Normal range of motion. She exhibits no edema or tenderness.  No calf tenderness    Assessment & Plan:  1. Shortness of breath S/p cholecystectomy on January 9. Patient most likely with atelectasis. She states that she has not  been taking deep breaths due to pain. Vital signs stable and patient well appearing. Lungs are clear on exam. Do not believe patient has a pneumonia at this time or PE. Oxygen saturation appropriate. Encouraged patient to do deep breathing to expand her lungs. Rx for Tessalon given for associated cough. Return precautions discussed. Follow-up as needed.  PATIENT EDUCATION PROVIDED: See AVS   Luiz Blare, DO 09/14/2016, 11:24 AM PGY-3, [place]

## 2016-09-14 NOTE — Patient Instructions (Signed)
No signs of pneumonia today Follow-up with surgeon Continue to make sure you are taking deep breaths to prevent pneumonia    Atelectasis, Adult Atelectasis is a collapse of air sacs in the lungs (alveoli). The condition causes all or part of a lung to collapse. Atelectasis is a common problem after surgery. Its severity depends on the size of lung tissue area involved and the underlying cause. When severe, it can lead to shortness of breath and heart problems. Atelectasis can develop suddenly or over a long period of time. Atelectasis that develops over a long period of time (chronic atelectasis) often leads to infection, scarring, and other problems. What are the causes? This condition may be caused by:  Shallow breathing.  Medicines that make breathing more shallow.  A blockage in an airway. Blockages can result from:  A buildup of mucus.  A tumor.  An inhaled object (foreign body).  Enlarged lymph nodes.  Fluid in the lungs (pleural effusion).  A blood clot in the lungs.  Outside pressure on the lung. Pressure can be due to:  A tumor.  Fluid in the lungs (pleural effusion).  Air leaking between the lung and rib cage (pneumothorax).  Enlarged lymph nodes.  Improper expansion of the lungs. This may occur in newborns because of:  Prematurity.  Low oxygen levels.  Secretions at birth that block the airway.  Amniotic fluid that goes into the lungs (aspiration). What increases the risk? This condition is more likely to develop in people who:  Have an injury or health problem that makes taking deep breaths difficult or painful.  Have certain infections or diseases, such as pneumonia or cystic fibrosis.  Have had surgery on the chest or abdomen.  Have broken ribs.  Have a tight bandage around their chest.  Have a collapsed lung due to pneumothorax.  Take medicines that decrease the rate of their breathing or how deeply they breathe, like sedatives.  Lie  flat for long periods of time. What are the signs or symptoms? Often, there are no symptoms for this condition. When symptoms do appear, they may include:  Shortness of breath.  Bluish color to the nails, lips, or mouth (cyanosis).  A cough. How is this diagnosed? This condition may be diagnosed based on:  Symptoms.  A physical exam.  A chest X-ray. Sometimes specialized imaging tests are needed to diagnose the condition. How is this treated? Treatment for this condition depends on what caused the condition. Treatment may involve:  Coughing. Coughing helps loosen mucus in the airway.  Chest physiotherapy. This is a treatment to help loosen and clear mucus from the airways. It is done by clapping the chest.  Postural drainage techniques. This treatment involves positioning your body so your head is lower than your chest. It helps mucus drain from your airways.  An incentive spirometer. This is a device that is used to help with taking deeper breaths.  Positive pressure breathing. This is a form of breathing assistance in which air is forced into the lungs when you breathe in (inhale). You may have this treatment if your condition is severe.  Treatment of the underlying condition. Follow these instructions at home:  Take over-the-counter and prescription medicines only as told by your health care provider.  Practice taking relaxed and deep breaths when you are sitting. A good time to practice is when you are watching TV. Take a few deep breaths during each commercial break.  Make sure to lie on your unaffected side when  you are lying down. For example, if you have atelectasis in your left lung, lie on your right side. This will help mucus drain from your airway.  Cough several times a day as told by your health care provider.  Perform chest physiotherapy or postural drainage techniques as told by your health care provider. If necessary, have someone help you.  If you were  given a device to help with breathing, use it as told by your health care provider.  Stay as active as possible. Get help right away if:  Your breathing problems get worse.  You have severe chest pain.  You develop severe coughing.  You cough up blood.  You have a fever.  You have persistent symptoms for more than 2-3 days.  Your symptoms suddenly get worse. This information is not intended to replace advice given to you by your health care provider. Make sure you discuss any questions you have with your health care provider. Document Released: 08/20/2005 Document Revised: 03/09/2016 Document Reviewed: 01/23/2016 Elsevier Interactive Patient Education  2017 [employer].

## 2016-10-18 ENCOUNTER — Encounter: Payer: Self-pay | Admitting: Family Medicine

## 2016-10-18 NOTE — Patient Instructions (Signed)
Please return in 1 month for your next A1C check. We will check your liver and pancreas at this visit as well.  FRONT OFFICE: Please cancel today's visit and unarrive. Patient will not be charged for today's encounter.

## 2016-10-20 NOTE — Progress Notes (Signed)
Patient ID: Tracy Weiss, female   DOB: 05/27/1966, 51 y.o.   MRN: [id number]  [name] showed up to today's visit believing she was do for A1C. Last A1C in 08/2016 with next due in 11/2016. She is a month too early for checking. Reports she has not other concerns she wishes to discuss today and requests we cancel today's visit and not charge her. After discussion with Burna Forts, we will cancel today's visit and no charges will be made. Return in 1 month for A1C check.

## 2016-11-01 ENCOUNTER — Telehealth: Payer: Self-pay | Admitting: Family Medicine

## 2016-11-01 ENCOUNTER — Other Ambulatory Visit: Payer: Self-pay

## 2016-11-01 DIAGNOSIS — Z0184 Encounter for antibody response examination: Secondary | ICD-10-CM

## 2016-11-01 NOTE — Telephone Encounter (Signed)
Pt needs to know if she has had rubella, varicella, mmr, flu shot and TB. This is for a job.  If she hasnt had them, she would like an appt ASAB. The company wants to fly her to [place].

## 2016-11-01 NOTE — Telephone Encounter (Signed)
Return call to patient regarding immunization and TB skin test.  Patient stated she is starting a new job and the company need proof that she completed the immunizations and need recent TB skin test.  Order given by Dr. Gerlean Ren for varicella and MMR titers to completed today.  Appt at 11-01-16 at 1:45 PM.  Most recent TB skin test was completed in 02/2016 and result was negative.  Patient is up to date on other vaccines.  Copy of immunizations printed and left up front.  Derl Barrow, RN

## 2016-11-02 ENCOUNTER — Encounter: Payer: Self-pay | Admitting: Family Medicine

## 2016-11-02 LAB — MEASLES/MUMPS/RUBELLA IMMUNITY
Mumps IgG: 85.6 AU/mL — ABNORMAL HIGH (ref <9.00)
Rubella: 7.63 Index — ABNORMAL HIGH (ref <0.90)
Rubeola IgG: 128 AU/mL — ABNORMAL HIGH (ref <25.00)

## 2016-11-02 LAB — VARICELLA ZOSTER ANTIBODY, IGG: Varicella IgG: 426.1 Index — ABNORMAL HIGH (ref <135.00)

## 2016-11-26 ENCOUNTER — Ambulatory Visit: Payer: Self-pay

## 2016-11-27 ENCOUNTER — Ambulatory Visit: Payer: Self-pay | Admitting: Family Medicine

## 2016-12-11 ENCOUNTER — Ambulatory Visit: Payer: Self-pay

## 2016-12-24 ENCOUNTER — Other Ambulatory Visit: Payer: Self-pay | Admitting: *Deleted

## 2016-12-24 NOTE — Telephone Encounter (Signed)
Pt states that her orange card is not active yet and she has an upcoming appointment with Kennyth Lose at the end of the month but needs a sample of her novolog until then.  Will forward to MD to give ok and then staff can put her name on the same for her to pick up. Jazmin Hartsell,CMA

## 2016-12-25 NOTE — Telephone Encounter (Signed)
Patient came into clinic today stating she was on day 2 of being out of her novolog and would like a sample until she gets her orange card on Monday.  Spoke with MD and verbal ok was given for this.  Novolog pen given [place] [id number], lot [id number], exp 01/31/2018. Jazmin Hartsell,CMA

## 2016-12-27 ENCOUNTER — Ambulatory Visit: Payer: Self-pay | Admitting: Pharmacist

## 2016-12-28 ENCOUNTER — Telehealth: Payer: Self-pay | Admitting: *Deleted

## 2016-12-28 NOTE — Telephone Encounter (Signed)
Received fax from [facility] requesting to change Lantus vials to SoloStar pens.  Also pt is requesting to change Novolog vials to Pens.  However; patient can not get Novolog Flexpen for free.  Please consider changing Novolog vial to Apidra SoloStar.  Patient will be to get both Lantus SoloStar and Apidra SoloStar for free. Please advise.  Derl Barrow, RN

## 2016-12-31 ENCOUNTER — Ambulatory Visit: Payer: Self-pay

## 2017-01-01 ENCOUNTER — Ambulatory Visit (INDEPENDENT_AMBULATORY_CARE_PROVIDER_SITE_OTHER): Payer: Self-pay | Admitting: Family Medicine

## 2017-01-01 ENCOUNTER — Encounter: Payer: Self-pay | Admitting: Family Medicine

## 2017-01-01 VITALS — BP 108/78 | HR 80 | Temp 98.1°F | Ht 61.0 in | Wt 183.0 lb

## 2017-01-01 DIAGNOSIS — F410 Panic disorder [episodic paroxysmal anxiety] without agoraphobia: Secondary | ICD-10-CM

## 2017-01-01 DIAGNOSIS — N920 Excessive and frequent menstruation with regular cycle: Secondary | ICD-10-CM

## 2017-01-01 DIAGNOSIS — Z794 Long term (current) use of insulin: Secondary | ICD-10-CM

## 2017-01-01 DIAGNOSIS — F902 Attention-deficit hyperactivity disorder, combined type: Secondary | ICD-10-CM

## 2017-01-01 DIAGNOSIS — E119 Type 2 diabetes mellitus without complications: Secondary | ICD-10-CM

## 2017-01-01 LAB — POCT GLYCOSYLATED HEMOGLOBIN (HGB A1C): Hemoglobin A1C: 7.8

## 2017-01-01 MED ORDER — METFORMIN HCL 1000 MG PO TABS: 1000.0000 mg | ORAL_TABLET | Freq: Every day | ORAL | 0 refills | Status: DC

## 2017-01-01 MED ORDER — ALPRAZOLAM 0.25 MG PO TABS: 0.2500 mg | ORAL_TABLET | Freq: Every day | ORAL | 0 refills | Status: DC | PRN

## 2017-01-01 MED ORDER — PITAVASTATIN CALCIUM 2 MG PO TABS: 2.0000 mg | ORAL_TABLET | Freq: Every morning | ORAL | 3 refills | Status: DC

## 2017-01-01 MED ORDER — METHYLPHENIDATE HCL ER (LA) 30 MG PO CP24
30.0000 mg | ORAL_CAPSULE | ORAL | 0 refills | Status: DC
Start: 2017-01-01 — End: 2017-01-17

## 2017-01-01 MED ORDER — METHYLPHENIDATE HCL ER (LA) 30 MG PO CP24: 30.0000 mg | ORAL_CAPSULE | ORAL | 0 refills | Status: DC

## 2017-01-01 MED ORDER — ALPRAZOLAM 0.25 MG PO TABS: 0.2500 mg | ORAL_TABLET | Freq: Two times a day (BID) | ORAL | 0 refills | Status: DC | PRN

## 2017-01-01 NOTE — Patient Instructions (Signed)
Thank you so much for coming to visit today! I have refilled your Xanax and Ritalin. [facility] We will check a blood pregnancy test and a CMP today. Please return in 17months or sooner if needed. It has been a pleasure getting to know you over the last 3years! You have truly been a great patient!  Dr. Gerlean Ren

## 2017-01-02 LAB — CMP14+EGFR
ALT: 23 IU/L (ref 0–32)
AST: 22 IU/L (ref 0–40)
Albumin/Globulin Ratio: 1.7 (ref 1.2–2.2)
Albumin: 4.3 g/dL (ref 3.5–5.5)
Alkaline Phosphatase: 49 IU/L (ref 39–117)
BUN/Creatinine Ratio: 9 (ref 9–23)
BUN: 8 mg/dL (ref 6–24)
Bilirubin Total: 0.3 mg/dL (ref 0.0–1.2)
CO2: 28 mmol/L (ref 18–29)
Calcium: 9.7 mg/dL (ref 8.7–10.2)
Chloride: 97 mmol/L (ref 96–106)
Creatinine, Ser: 0.9 mg/dL (ref 0.57–1.00)
GFR calc Af Amer: 87 mL/min/{1.73_m2} (ref >59)
GFR calc non Af Amer: 75 mL/min/{1.73_m2} (ref >59)
Globulin, Total: 2.6 g/dL (ref 1.5–4.5)
Glucose: 201 mg/dL — ABNORMAL HIGH (ref 65–99)
Potassium: 4.3 mmol/L (ref 3.5–5.2)
Sodium: 138 mmol/L (ref 134–144)
Total Protein: 6.9 g/dL (ref 6.0–8.5)

## 2017-01-02 MED FILL — ALPRAZolam 0.25 MG TABS: 0.25 | 30 days supply | Qty: 30 | Fill #0
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
## 2017-01-03 ENCOUNTER — Telehealth: Payer: Self-pay | Admitting: Family Medicine

## 2017-01-03 MED ORDER — INSULIN GLARGINE 100 UNIT/ML SOLOSTAR PEN
15.0000 [IU] | PEN_INJECTOR | Freq: Every day | SUBCUTANEOUS | 11 refills | Status: DC
Start: 2017-01-03 — End: 2018-01-16

## 2017-01-03 MED ORDER — INSULIN GLULISINE 100 UNIT/ML SOLOSTAR PEN: 10.0000 [IU] | PEN_INJECTOR | Freq: Three times a day (TID) | SUBCUTANEOUS | 11 refills | Status: DC

## 2017-01-03 NOTE — Telephone Encounter (Signed)
Prescription for insulin sent to pharmacy as pens instead of vials. Medications reviewed. Recommend discontinuing Benicar and Statin while she is trying to get pregnant. Strongly recommend restarting after she is no longer trying to conceive. Lab obtained was normal. Blood pregnancy test is still pending.

## 2017-01-03 NOTE — Telephone Encounter (Signed)
Pt informed and will await a call with the remaining results. Katharina Caper, April D, [place]

## 2017-01-07 NOTE — Addendum Note (Signed)
Addended by: [place] Pink on: 01/07/2017 04:32 PM   Modules accepted: Orders

## 2017-01-17 ENCOUNTER — Encounter (HOSPITAL_COMMUNITY): Payer: Self-pay | Admitting: *Deleted

## 2017-01-17 ENCOUNTER — Inpatient Hospital Stay (HOSPITAL_COMMUNITY): Payer: Self-pay

## 2017-01-17 ENCOUNTER — Telehealth: Payer: Self-pay | Admitting: Family Medicine

## 2017-01-17 ENCOUNTER — Other Ambulatory Visit: Payer: Self-pay | Admitting: Family Medicine

## 2017-01-17 ENCOUNTER — Inpatient Hospital Stay (HOSPITAL_COMMUNITY)
Admission: AD | Admit: 2017-01-17 | Discharge: 2017-01-17 | Disposition: A | Discharge status: Home / Self Care | Payer: Self-pay | Source: Ambulatory Visit | Attending: Obstetrics & Gynecology | Admitting: Obstetrics & Gynecology

## 2017-01-17 DIAGNOSIS — R102 Pelvic and perineal pain: Secondary | ICD-10-CM | POA: Insufficient documentation

## 2017-01-17 DIAGNOSIS — N838 Other noninflammatory disorders of ovary, fallopian tube and broad ligament: Secondary | ICD-10-CM | POA: Insufficient documentation

## 2017-01-17 DIAGNOSIS — N898 Other specified noninflammatory disorders of vagina: Secondary | ICD-10-CM | POA: Insufficient documentation

## 2017-01-17 DIAGNOSIS — Z9049 Acquired absence of other specified parts of digestive tract: Secondary | ICD-10-CM | POA: Insufficient documentation

## 2017-01-17 DIAGNOSIS — N920 Excessive and frequent menstruation with regular cycle: Secondary | ICD-10-CM

## 2017-01-17 DIAGNOSIS — Z9889 Other specified postprocedural states: Secondary | ICD-10-CM | POA: Insufficient documentation

## 2017-01-17 DIAGNOSIS — Z79899 Other long term (current) drug therapy: Secondary | ICD-10-CM | POA: Insufficient documentation

## 2017-01-17 DIAGNOSIS — E119 Type 2 diabetes mellitus without complications: Secondary | ICD-10-CM | POA: Insufficient documentation

## 2017-01-17 DIAGNOSIS — N949 Unspecified condition associated with female genital organs and menstrual cycle: Secondary | ICD-10-CM

## 2017-01-17 DIAGNOSIS — Z794 Long term (current) use of insulin: Secondary | ICD-10-CM | POA: Insufficient documentation

## 2017-01-17 DIAGNOSIS — Z87891 Personal history of nicotine dependence: Secondary | ICD-10-CM | POA: Insufficient documentation

## 2017-01-17 DIAGNOSIS — R1030 Lower abdominal pain, unspecified: Secondary | ICD-10-CM | POA: Insufficient documentation

## 2017-01-17 LAB — WET PREP, GENITAL
Clue Cells Wet Prep HPF POC: NONE SEEN
Sperm: NONE SEEN
Trich, Wet Prep: NONE SEEN
Yeast Wet Prep HPF POC: NONE SEEN

## 2017-01-17 LAB — POCT PREGNANCY, URINE: Preg Test, Ur: NEGATIVE

## 2017-01-17 LAB — URINALYSIS, ROUTINE W REFLEX MICROSCOPIC
Bilirubin Urine: NEGATIVE
Glucose, UA: 150 mg/dL — AB
Hgb urine dipstick: NEGATIVE
Ketones, ur: NEGATIVE mg/dL
Leukocytes, UA: NEGATIVE
Nitrite: NEGATIVE
Protein, ur: NEGATIVE mg/dL
Specific Gravity, Urine: 1.019 (ref 1.005–1.030)
pH: 7 (ref 5.0–8.0)

## 2017-01-17 LAB — CBC WITH DIFFERENTIAL/PLATELET
Basophils Absolute: 0 10*3/uL (ref 0.0–0.1)
Basophils Relative: 0 %
Eosinophils Absolute: 0.1 10*3/uL (ref 0.0–0.7)
Eosinophils Relative: 1 %
HCT: 36.7 % (ref 36.0–46.0)
Hemoglobin: 12.3 g/dL (ref 12.0–15.0)
Lymphocytes Relative: 40 %
Lymphs Abs: 3.3 10*3/uL (ref 0.7–4.0)
MCH: 29.6 pg (ref 26.0–34.0)
MCHC: 33.5 g/dL (ref 30.0–36.0)
MCV: 88.2 fL (ref 78.0–100.0)
Monocytes Absolute: 0.3 10*3/uL (ref 0.1–1.0)
Monocytes Relative: 4 %
Neutro Abs: 4.4 10*3/uL (ref 1.7–7.7)
Neutrophils Relative %: 55 %
Platelets: 355 10*3/uL (ref 150–400)
RBC: 4.16 MIL/uL (ref 3.87–5.11)
RDW: 13.3 % (ref 11.5–15.5)
WBC: 8.2 10*3/uL (ref 4.0–10.5)

## 2017-01-17 LAB — HCG, QUANTITATIVE, PREGNANCY: hCG, Beta Chain, Quant, S: <1 m[IU]/mL (ref <5)

## 2017-01-17 NOTE — Telephone Encounter (Signed)
PT would like lab results from may 1.  Also the results should be sent to [facility]

## 2017-01-17 NOTE — MAU Note (Signed)
PT  SAYS   SHE  WENT  [facility]     ON 5-1-   FOR  ROUTINE  CHECKUP-   LABS  DRAWN  BUT  DOESN'T KNOW  RESULTS.     HAS BEEN HURTING IN    LOWER ABD  AND  FEELS  SWOLLEN    - SHARP  PAIN     SINCE SAT-     NO VAG BLEEDING.      NO BIRTH CONTROL.   LAST SEX-     Monday.     NO HPT       NOTHING  FRO  PAIN TODAY.       SHE CALLED  [facility]- TODAY -- DID NOT CALL HER BACK .

## 2017-01-17 NOTE — Telephone Encounter (Signed)
Out of office. Please let Tracy Weiss know that she will need to return for repeat lab testing. HCG was ordered and when not completed initially another verbal order was given--appears that lab has still not been completed. Will order future lab and patient may schedule a lab visit at her convenience to have this completed. I apologize for this confusion.

## 2017-01-17 NOTE — MAU Note (Signed)
[place] disconnected (eSignature). Pt. Signed paper copy, placed in chart and sent to medical records.

## 2017-01-17 NOTE — MAU Provider Note (Signed)
History     CSN: [id number]  Arrival date and time: 01/17/17 [place]   First Provider Initiated Contact with Patient 01/17/17 2030      Chief Complaint  Patient presents with  . Abdominal Pain   Non-pregnant female here with abd pain x4 days. Pain is in lower abdomen, intermittent, and describes as pulling. Rates pain 10/10. She used Ibuprofen yesterday and was able to sleep. She has hx if infertility being treated by Dr. Kerin Perna, and used the medication Femara last on March 6th. She had one day of spotting in April but no menses since. Her menses was due 3 days ago. Reports abdominal bloating over the last month. She reports watery white vaginal discharge without odor or itch. No new partner.    Pertinent Gynecological History: Contraception: none Blood transfusions: none Sexually transmitted diseases: no past history Previous GYN Procedures: none    Past Medical History:  Diagnosis Date  . Diabetes mellitus without complication ([place])   . Gallstones 09/2016    Past Surgical History:  Procedure Laterality Date  . CESAREAN SECTION    . CHOLECYSTECTOMY N/A 09/11/2016   Procedure: LAPAROSCOPIC CHOLECYSTECTOMY WITH INTRAOPERATIVE CHOLANGIOGRAM;  Surgeon: Donnie Mesa, MD;  Location: [facility];  Service: General;  Laterality: N/A;  . tubal ligaton reversal  2017    Family History  Problem Relation Age of Onset  . Cancer Mother        Metastatic with Unknown Primary; Stomach was involved  . Diabetes Father   . Heart disease Father     Social History  Substance Use Topics  . Smoking status: Former Smoker    Packs/day: 0.50    Years: 10.00    Types: Cigarettes    Start date: 09/03/1982    Quit date: 09/04/1983  . Smokeless tobacco: Never Used     Comment: ' Patient claims she has never smoked "  . Alcohol use No    Allergies: No Known Allergies  Prescriptions Prior to Admission  Medication Sig Dispense Refill Last Dose  . ALPRAZolam (XANAX) 0.25 MG tablet Take 1 tablet  (0.25 mg total) by mouth 2 (two) times daily as needed for anxiety. 30 tablet 0 Past Week at Unknown time  . cycloSPORINE (RESTASIS) 0.05 % ophthalmic emulsion Place 1 drop into both eyes 2 (two) times daily as needed (dry eyes).   01/17/2017 at Unknown time  . insulin aspart (NOVOLOG) 100 UNIT/ML injection Inject 5-10 Units into the skin 3 (three) times daily before meals.   01/17/2017 at 1400  . liraglutide (VICTOZA) 18 MG/3ML SOPN Inject 1.8 mg into the skin daily.   01/15/2017 at Unknown time  . metFORMIN (GLUCOPHAGE) 1000 MG tablet Take 1 tablet (1,000 mg total) by mouth daily with breakfast. 90 tablet 0 01/17/2017 at Unknown time  . methylphenidate (RITALIN LA) 30 MG 24 hr capsule Take 1 capsule (30 mg total) by mouth every morning. 30 capsule 0 01/16/2017 at Unknown time  . Multiple Vitamin (MULTIVITAMIN WITH MINERALS) TABS tablet Take 1 tablet by mouth daily.   01/17/2017 at Unknown time  . ALPRAZolam (XANAX) 0.25 MG tablet Take 1 tablet (0.25 mg total) by mouth daily as needed for anxiety. 30 tablet 0   . ALPRAZolam (XANAX) 0.25 MG tablet Take 1 tablet (0.25 mg total) by mouth 2 (two) times daily as needed for anxiety. 30 tablet 0   . Insulin Glargine (LANTUS) 100 UNIT/ML Solostar Pen Inject 15 Units into the skin daily at 10 pm. 15 mL 11 not started  yet  . Insulin Glulisine (APIDRA) 100 UNIT/ML Solostar Pen Inject 10 Units into the skin 3 (three) times daily with meals. 15 mL 11 not filled yet  . letrozole (FEMARA) 2.5 MG tablet Take 3 tablets by mouth daily as needed. On cycle days 3-7 prn  2 prn  . methylphenidate (RITALIN LA) 30 MG 24 hr capsule Take 1 capsule (30 mg total) by mouth every morning. 30 capsule 0   . methylphenidate (RITALIN LA) 30 MG 24 hr capsule Take 1 capsule (30 mg total) by mouth every morning. 30 capsule 0     Review of Systems  Constitutional: Negative for fever.  Gastrointestinal: Positive for abdominal pain. Negative for constipation, diarrhea, nausea and vomiting.   Genitourinary: Positive for frequency and vaginal discharge. Negative for dysuria, hematuria, urgency and vaginal bleeding.   Physical Exam   Blood pressure 121/82, pulse 84, temperature 98.6 F (37 C), temperature source Oral, resp. rate 20, height 5' 1.5" (1.562 m), weight 83.8 kg (184 lb 12 oz), last menstrual period 12/18/2016.  Physical Exam  Constitutional: She is oriented to person, place, and time. She appears well-developed and well-nourished. No distress (appears comfortable).  HENT:  Head: Normocephalic and atraumatic.  Neck: Normal range of motion.  Cardiovascular: Normal rate.   Respiratory: Effort normal.  GI: Soft. She exhibits no distension and no mass. There is no tenderness. There is no rebound and no guarding.  Genitourinary:  Genitourinary Comments: External: no lesions or erythema Vagina: rugated, thin white discharge Uterus: non enlarged, anteverted, non tender, no CMT Adnexae: no masses, no tenderness left, no tenderness right\  Musculoskeletal: Normal range of motion.  Neurological: She is alert and oriented to person, place, and time.  Skin: Skin is warm and dry.  Psychiatric: She has a normal mood and affect.   Results for orders placed or performed during the hospital encounter of 01/17/17 (from the past 24 hour(s))  Urinalysis, Routine w reflex microscopic     Status: Abnormal   Collection Time: 01/17/17  7:21 PM  Result Value Ref Range   Color, Urine YELLOW YELLOW   APPearance HAZY (A) CLEAR   Specific Gravity, Urine 1.019 1.005 - 1.030   pH 7.0 5.0 - 8.0   Glucose, UA 150 (A) NEGATIVE mg/dL   Hgb urine dipstick NEGATIVE NEGATIVE   Bilirubin Urine NEGATIVE NEGATIVE   Ketones, ur NEGATIVE NEGATIVE mg/dL   Protein, ur NEGATIVE NEGATIVE mg/dL   Nitrite NEGATIVE NEGATIVE   Leukocytes, UA NEGATIVE NEGATIVE  Pregnancy, urine POC     Status: None   Collection Time: 01/17/17  7:28 PM  Result Value Ref Range   Preg Test, Ur NEGATIVE NEGATIVE  hCG,  quantitative, pregnancy     Status: None   Collection Time: 01/17/17  8:52 PM  Result Value Ref Range   hCG, Beta Chain, Quant, S <1 <5 mIU/mL  CBC with Differential/Platelet     Status: None   Collection Time: 01/17/17  8:52 PM  Result Value Ref Range   WBC 8.2 4.0 - 10.5 K/uL   RBC 4.16 3.87 - 5.11 MIL/uL   Hemoglobin 12.3 12.0 - 15.0 g/dL   HCT 36.7 36.0 - 46.0 %   MCV 88.2 78.0 - 100.0 fL   MCH 29.6 26.0 - 34.0 pg   MCHC 33.5 30.0 - 36.0 g/dL   RDW 13.3 11.5 - 15.5 %   Platelets 355 150 - 400 K/uL   Neutrophils Relative % 55 %   Neutro Abs 4.4 1.7 -  7.7 K/uL   Lymphocytes Relative 40 %   Lymphs Abs 3.3 0.7 - 4.0 K/uL   Monocytes Relative 4 %   Monocytes Absolute 0.3 0.1 - 1.0 K/uL   Eosinophils Relative 1 %   Eosinophils Absolute 0.1 0.0 - 0.7 K/uL   Basophils Relative 0 %   Basophils Absolute 0.0 0.0 - 0.1 K/uL  Wet prep, genital     Status: Abnormal   Collection Time: 01/17/17  9:00 PM  Result Value Ref Range   Yeast Wet Prep HPF POC NONE SEEN NONE SEEN   Trich, Wet Prep NONE SEEN NONE SEEN   Clue Cells Wet Prep HPF POC NONE SEEN NONE SEEN   WBC, Wet Prep HPF POC FEW (A) NONE SEEN   Sperm NONE SEEN    US Transvaginal Non-ob  Result Date: 01/17/2017 CLINICAL DATA:  Sharp pelvic pain for 5 days, abdominal swelling. Premenopausal. History of diabetes and gallstones. EXAM: TRANSABDOMINAL AND TRANSVAGINAL ULTRASOUND OF PELVIS DOPPLER ULTRASOUND OF OVARIES TECHNIQUE: Both transabdominal and transvaginal ultrasound examinations of the pelvis were performed. Transabdominal technique was performed for global imaging of the pelvis including uterus, ovaries, adnexal regions, and pelvic cul-de-sac. It was necessary to proceed with endovaginal exam following the transabdominal exam to visualize the endometrium and ovaries. Color and duplex Doppler ultrasound was utilized to evaluate blood flow to the ovaries. COMPARISON:  Pelvic ultrasound April 03, 2016 FINDINGS: Uterus Measurements:  11.8 x 5.6 x 6.1 cm. No fibroids or other mass visualized. Heterogeneous myometrium without distinct leiomyoma on today's examination. Endometrium Thickness: 13.  No focal abnormality visualized. Right ovary Measurements: 3.5 x 3.2 x 3.5 cm. Normal appearance/no adnexal mass. 3.2 x 2.8 x 2.7 cm with lace-like echogenicity and increased through transmission, no vascular component. Left ovary Measurements: 3.7 x 1.7 x 1.5 cm. Normal appearance/no adnexal mass. Pulsed Doppler evaluation of both ovaries demonstrates normal low-resistance arterial and venous waveforms. Other findings Trace free fluid in the pelvic cul-de-sac is likely physiologic. IMPRESSION: 3.2 cm RIGHT hemorrhagic adnexal cyst. If patient is early premenopausal, recommend follow-up in 6-12 weeks, otherwise no indicated follow-up. This recommendation follows the consensus statement: Management of Asymptomatic Ovarian and Other Adnexal Cysts Imaged at [name]: Society of Radiologists in [place]. Radiology 2010; [phone]. Electronically Signed   By: Elon Alas M.D.   On: 01/17/2017 22:31   US Pelvis Complete  Result Date: 01/17/2017 CLINICAL DATA:  Sharp pelvic pain for 5 days, abdominal swelling. Premenopausal. History of diabetes and gallstones. EXAM: TRANSABDOMINAL AND TRANSVAGINAL ULTRASOUND OF PELVIS DOPPLER ULTRASOUND OF OVARIES TECHNIQUE: Both transabdominal and transvaginal ultrasound examinations of the pelvis were performed. Transabdominal technique was performed for global imaging of the pelvis including uterus, ovaries, adnexal regions, and pelvic cul-de-sac. It was necessary to proceed with endovaginal exam following the transabdominal exam to visualize the endometrium and ovaries. Color and duplex Doppler ultrasound was utilized to evaluate blood flow to the ovaries. COMPARISON:  Pelvic ultrasound April 03, 2016 FINDINGS: Uterus Measurements: 11.8 x 5.6 x 6.1 cm. No fibroids or other mass  visualized. Heterogeneous myometrium without distinct leiomyoma on today's examination. Endometrium Thickness: 13.  No focal abnormality visualized. Right ovary Measurements: 3.5 x 3.2 x 3.5 cm. Normal appearance/no adnexal mass. 3.2 x 2.8 x 2.7 cm with lace-like echogenicity and increased through transmission, no vascular component. Left ovary Measurements: 3.7 x 1.7 x 1.5 cm. Normal appearance/no adnexal mass. Pulsed Doppler evaluation of both ovaries demonstrates normal low-resistance arterial and venous waveforms. Other findings Trace free fluid  in the pelvic cul-de-sac is likely physiologic. IMPRESSION: 3.2 cm RIGHT hemorrhagic adnexal cyst. If patient is early premenopausal, recommend follow-up in 6-12 weeks, otherwise no indicated follow-up. This recommendation follows the consensus statement: Management of Asymptomatic Ovarian and Other Adnexal Cysts Imaged at [name]: Society of Radiologists in [place]. Radiology 2010; [phone]. Electronically Signed   By: Elon Alas M.D.   On: 01/17/2017 22:31   [name] Art/ven Flow Abd Pelv Doppler  Result Date: 01/17/2017 CLINICAL DATA:  Sharp pelvic pain for 5 days, abdominal swelling. Premenopausal. History of diabetes and gallstones. EXAM: TRANSABDOMINAL AND TRANSVAGINAL ULTRASOUND OF PELVIS DOPPLER ULTRASOUND OF OVARIES TECHNIQUE: Both transabdominal and transvaginal ultrasound examinations of the pelvis were performed. Transabdominal technique was performed for global imaging of the pelvis including uterus, ovaries, adnexal regions, and pelvic cul-de-sac. It was necessary to proceed with endovaginal exam following the transabdominal exam to visualize the endometrium and ovaries. Color and duplex Doppler ultrasound was utilized to evaluate blood flow to the ovaries. COMPARISON:  Pelvic ultrasound April 03, 2016 FINDINGS: Uterus Measurements: 11.8 x 5.6 x 6.1 cm. No fibroids or other mass visualized. Heterogeneous myometrium  without distinct leiomyoma on today's examination. Endometrium Thickness: 13.  No focal abnormality visualized. Right ovary Measurements: 3.5 x 3.2 x 3.5 cm. Normal appearance/no adnexal mass. 3.2 x 2.8 x 2.7 cm with lace-like echogenicity and increased through transmission, no vascular component. Left ovary Measurements: 3.7 x 1.7 x 1.5 cm. Normal appearance/no adnexal mass. Pulsed Doppler evaluation of both ovaries demonstrates normal low-resistance arterial and venous waveforms. Other findings Trace free fluid in the pelvic cul-de-sac is likely physiologic. IMPRESSION: 3.2 cm RIGHT hemorrhagic adnexal cyst. If patient is early premenopausal, recommend follow-up in 6-12 weeks, otherwise no indicated follow-up. This recommendation follows the consensus statement: Management of Asymptomatic Ovarian and Other Adnexal Cysts Imaged at [name]: Society of Radiologists in [place]. Radiology 2010; [phone]. Electronically Signed   By: Elon Alas M.D.   On: 01/17/2017 22:31    MAU Course  Procedures  MDM Labs and [name] ordered and reviewed. Pt reports pain is better, although no pain meds were given. Small rt adnexal cyst noted on [name] and this could be the source of her pain, but would expect spontaneous resolution. No evidence of pregnancy, acute abdominal process, UTI or pelvic infection. I recommend she f/u with Dr Kerin Perna since she is undergoing infertility treatment and ovarian stimulation. She is stable for discharge home.  Assessment and Plan   1. Adnexal cyst   2. Pelvic pain    Discharge home Ibuprofen or Aleve prn Follow up with Dr. Kerin Perna Follow up with PCP as needed  Allergies as of 01/17/2017   No Known Allergies     Medication List    TAKE these medications   ALPRAZolam 0.25 MG tablet Commonly known as:  XANAX Take 1 tablet (0.25 mg total) by mouth 2 (two) times daily as needed for anxiety. What changed:  Another medication with the same  name was removed. Continue taking this medication, and follow the directions you see here.   cycloSPORINE 0.05 % ophthalmic emulsion Commonly known as:  RESTASIS Place 1 drop into both eyes 2 (two) times daily as needed (dry eyes).   insulin aspart 100 UNIT/ML injection Commonly known as:  novoLOG Inject 5-10 Units into the skin 3 (three) times daily before meals.   Insulin Glargine 100 UNIT/ML Solostar Pen Commonly known as:  LANTUS Inject 15 Units into the skin daily at 10 pm.  Insulin Glulisine 100 UNIT/ML Solostar Pen Commonly known as:  APIDRA Inject 10 Units into the skin 3 (three) times daily with meals.   letrozole 2.5 MG tablet Commonly known as:  FEMARA Take 3 tablets by mouth daily as needed. On cycle days 3-7 prn   metFORMIN 1000 MG tablet Commonly known as:  GLUCOPHAGE Take 1 tablet (1,000 mg total) by mouth daily with breakfast.   methylphenidate 30 MG 24 hr capsule Commonly known as:  RITALIN LA Take 1 capsule (30 mg total) by mouth every morning. What changed:  Another medication with the same name was removed. Continue taking this medication, and follow the directions you see here.   multivitamin with minerals Tabs tablet Take 1 tablet by mouth daily.   VICTOZA 18 MG/3ML Sopn Generic drug:  liraglutide Inject 1.8 mg into the skin daily.      Julianne Handler, CNM 01/17/2017, 8:32 PM

## 2017-01-17 NOTE — Discharge Instructions (Signed)
Ovarian Cyst °An ovarian cyst is a fluid-filled sac on an ovary. The ovaries are organs that make eggs in women. Most ovarian cysts go away on their own and are not cancerous (are benign). Some cysts need treatment. °Follow these instructions at home: °· Take over-the-counter and prescription medicines only as told by your doctor. °· Do not drive or use heavy machinery while taking prescription pain medicine. °· Get pelvic exams and Pap tests as often as told by your doctor. °· Return to your normal activities as told by your doctor. Ask your doctor what activities are safe for you. °· Do not use any products that contain nicotine or tobacco, such as cigarettes and e-cigarettes. If you need help quitting, ask your doctor. °· Keep all follow-up visits as told by your doctor. This is important. °Contact a doctor if: °· Your periods are: °¨ Late. °¨ Irregular. °¨ Painful. °· Your periods stop. °· You have pelvic pain that does not go away. °· You have pressure on your bladder. °· You have trouble making your bladder empty when you pee (urinate). °· You have pain during sex. °· You have any of the following in your belly (abdomen): °¨ A feeling of fullness. °¨ Pressure. °¨ Discomfort. °¨ Pain that does not go away. °¨ Swelling. °· You feel sick most of the time. °· You have trouble pooping (have constipation). °· You are not as hungry as usual (you lose your appetite). °· You get very bad acne. °· You start to have more hair on your body and face. °· You are gaining weight or losing weight without changing your exercise and eating habits. °· You think you may be pregnant. °Get help right away if: °· You have belly pain that is very bad or gets worse. °· You cannot eat or drink without throwing up (vomiting). °· You suddenly get a fever. °· Your period is a lot heavier than usual. °This information is not intended to replace advice given to you by your health care provider. Make sure you discuss any questions you have  with your health care provider. °Document Released: 02/06/2008 Document Revised: 03/09/2016 Document Reviewed: 01/22/2016 °Elsevier Interactive Patient Education © 2017 [employer] Inc. ° °

## 2017-01-18 LAB — GC/CHLAMYDIA PROBE AMP (~~LOC~~) NOT AT ARMC
Chlamydia: NEGATIVE
Neisseria Gonorrhea: NEGATIVE

## 2017-01-18 NOTE — Telephone Encounter (Signed)
Pt informed of below and she said that she went to the [facility] yesterday and that they went ahead and ran the lab.  She said that her PCP should be able to see those results. Told her I would send that message to her PCP to let her know. Katharina Caper, April D, [place]

## 2017-03-18 ENCOUNTER — Ambulatory Visit (INDEPENDENT_AMBULATORY_CARE_PROVIDER_SITE_OTHER): Payer: Self-pay | Admitting: Internal Medicine

## 2017-03-18 ENCOUNTER — Ambulatory Visit: Payer: Self-pay | Admitting: Internal Medicine

## 2017-03-18 ENCOUNTER — Encounter: Payer: Self-pay | Admitting: Internal Medicine

## 2017-03-18 DIAGNOSIS — L298 Other pruritus: Secondary | ICD-10-CM

## 2017-03-18 DIAGNOSIS — N898 Other specified noninflammatory disorders of vagina: Secondary | ICD-10-CM | POA: Insufficient documentation

## 2017-03-18 MED ORDER — FLUCONAZOLE 150 MG PO TABS: 150.0000 mg | ORAL_TABLET | Freq: Once | ORAL | 0 refills | Status: AC

## 2017-03-18 MED FILL — FLUCONAZOLE 150 MG TABLET: 150 | 2 days supply | Qty: 2 | Fill #0

## 2017-03-18 NOTE — Progress Notes (Signed)
   Subjective:   Patient: Tracy Weiss       Birthdate: 1966-04-27       MRN: [id number]      HPI  Tracy Weiss is a 51 y.o. female presenting for same day visit for concern for yeast infection.   Vaginal itching Patient reports vaginal itching and irritation beginning two days ago. Also reports thick discharge on toilet paper. Denies nausea, vomiting, abdominal pain, fevers, chills. Has not tried anything OTC. Reports blood sugars have been slightly higher than normal recently but no other changes. Is not sexually active. Has not taken any antibiotics recently. Has had yeast infections in the past and says that current symptoms are similar to those she has experienced before.   Smoking status reviewed. Patient is former smoker (quit 1985).   Review of Systems See HPI.     Objective:  Physical Exam  Constitutional: She is oriented to person, place, and time and well-developed, well-nourished, and in no distress.  HENT:  Head: Normocephalic and atraumatic.  Pulmonary/Chest: Effort normal. No respiratory distress.  Abdominal: Soft. Bowel sounds are normal. She exhibits no distension. There is no tenderness.  Genitourinary:  Genitourinary Comments: Patient declining vaginal exam or self-swab of discharge  Neurological: She is alert and oriented to person, place, and time.  Skin: Skin is warm and dry.  Psychiatric: Affect and judgment normal.      Assessment & Plan:  Vaginal itching Symptoms of vaginal itching and irritation accompanied by thick white discharge most consistent with vaginal candidiasis. No abdominal tenderness to palpation, no reports of systemic signs of infection. Patient very well-appearing on exam. Patient declining vaginal exam, however as she has had yeast infections in past and says current symptoms are same as prior, will treat empirically. Patient says in past she has required two doses of Diflucan, so will prescribe two tablets.  - Diflucan 750mg  today, can take second  tablet if no resolution of symptoms  Adin Hector, MD, MPH PGY-3 [facility] Pager [phone]

## 2017-03-18 NOTE — Patient Instructions (Signed)
It was nice meeting you today Ms. Aaberg!  If you have any questions or concerns, please feel free to call the clinic.   Be well,  Dr. Avon Gully

## 2017-03-18 NOTE — Assessment & Plan Note (Signed)
Symptoms of vaginal itching and irritation accompanied by thick white discharge most consistent with vaginal candidiasis. No abdominal tenderness to palpation, no reports of systemic signs of infection. Patient very well-appearing on exam. Patient declining vaginal exam, however as she has had yeast infections in past and says current symptoms are same as prior, will treat empirically. Patient says in past she has required two doses of Diflucan, so will prescribe two tablets.  - Diflucan 750mg  today, can take second tablet if no resolution of symptoms

## 2017-03-28 ENCOUNTER — Other Ambulatory Visit: Payer: Self-pay | Admitting: Internal Medicine

## 2017-03-28 ENCOUNTER — Other Ambulatory Visit: Payer: Self-pay | Admitting: Family Medicine

## 2017-03-28 DIAGNOSIS — Z1231 Encounter for screening mammogram for malignant neoplasm of breast: Secondary | ICD-10-CM

## 2017-04-01 ENCOUNTER — Other Ambulatory Visit: Payer: Self-pay | Admitting: *Deleted

## 2017-04-01 MED ORDER — METFORMIN HCL 1000 MG PO TABS: 1000.0000 mg | ORAL_TABLET | Freq: Every day | ORAL | 0 refills | Status: DC

## 2017-04-08 ENCOUNTER — Other Ambulatory Visit: Payer: Self-pay | Admitting: *Deleted

## 2017-04-09 MED ORDER — LIRAGLUTIDE 18 MG/3ML ~~LOC~~ SOPN: 1.8000 mg | PEN_INJECTOR | Freq: Every day | SUBCUTANEOUS | 0 refills | Status: DC

## 2017-04-09 NOTE — Addendum Note (Signed)
Addended by: Bufford Lope on: 04/09/2017 04:25 PM   Modules accepted: Orders

## 2017-04-10 ENCOUNTER — Other Ambulatory Visit: Payer: Self-pay | Admitting: *Deleted

## 2017-04-10 MED ORDER — LIRAGLUTIDE 18 MG/3ML ~~LOC~~ SOPN: 1.8000 mg | PEN_INJECTOR | Freq: Every day | SUBCUTANEOUS | 0 refills | Status: DC

## 2017-04-13 ENCOUNTER — Telehealth: Payer: Self-pay | Admitting: Internal Medicine

## 2017-04-13 NOTE — Telephone Encounter (Signed)
[facility] After Hours Telephone Line   Daughter called for her mother [name]. She is wondering about what medications can be given for pain. Patient has had foot pain and is planning on seeing family medicine this week. I indicated that Tylenol would be the best medication for patient to take In her heart and kidney history. Daughter voice understanding.  Kerrin Mo PGY-2 [place]

## 2017-05-30 ENCOUNTER — Ambulatory Visit (INDEPENDENT_AMBULATORY_CARE_PROVIDER_SITE_OTHER): Payer: Self-pay | Admitting: Pharmacist

## 2017-05-30 ENCOUNTER — Encounter: Payer: Self-pay | Admitting: Pharmacist

## 2017-05-30 DIAGNOSIS — E119 Type 2 diabetes mellitus without complications: Secondary | ICD-10-CM

## 2017-05-30 DIAGNOSIS — Z794 Long term (current) use of insulin: Secondary | ICD-10-CM

## 2017-05-30 LAB — POCT GLYCOSYLATED HEMOGLOBIN (HGB A1C): Hemoglobin A1C: 9.3

## 2017-05-30 MED ORDER — METFORMIN HCL ER 500 MG PO TB24: ORAL_TABLET | ORAL | 3 refills | Status: DC

## 2017-05-30 MED ORDER — DULAGLUTIDE 0.75 MG/0.5ML ~~LOC~~ SOAJ: 0.7500 mg | SUBCUTANEOUS | 1 refills | Status: DC

## 2017-05-30 NOTE — Progress Notes (Signed)
Patient ID: Tracy Weiss, female   DOB: [birth year], 51 y.o.   MRN: [id number] Reviewed: Agree with Dr. Graylin Shiver documentation and management.

## 2017-05-30 NOTE — Progress Notes (Signed)
S:     Chief Complaint  Patient presents with  . Medication Management    diabetes    Patient arrives in fair spirits ambulating without assistance.  Presents for diabetes evaluation, education, and management at the request of Dr. Gerlean Ren. Patient was referred on 08/13/16 by Dr. Gerlean Ren.  Patient was last seen by Primary Care Provider on 03/18/17.   Wakes up with high sugars on apidra, finds that she is having to use much more apidra than novolog to control CBGs. Reports that she doesn't eat much when she takes victoza, doesn't take very regularly, ~1x/week. Rotating injection sites in abdomen.   Endorses GI upset with metformin but this is desirable as she struggles with constipation.  Inquires about stimulants and other pharmacologic recommendations for weight loss. She has ~5-6 day supply left of Ritalin.   Going back to health coach (accountability, physical activity) this week, contacts them daily.  Fertility: Stopped femara, working on health instead, not planning to start other therapy right now.   Patient reports adherence with medications except victoza, doesn't always take this - maybe 1-2x/week. Scale of 10, 9/10 she will take her Victoza every day. Reports she has 4 pens of Victoza.  Current diabetes medications include: Lantus 30 units daily, apidra 20 units TID, metformin 1000 mg daily, victoza 1.8 mg once weekly (nonadherent)  Adjusts insulin, "I know my body" Taking lantus 15 daily, apidra 5-10 units with breakfast Lunch - if eating fish/potatos/broccoli - if sugar levels are in the 100s - give apidra 5 units daily, will give more if she feels thirsty or blurry vision. If she's in the 200s - will give 20 units Supper - if sugars are in the 100s and she's not eating anything heavy she will skip apidra because she has gotten low overnight. ; if eating more will take 10 units.  Doesn't take apidra if she is exercising, always takes Lantus.   Patient reports hypoglycemic  event once in September, occurs rarely.  Patient reported exercise habits: In the last two weeks: every morning does body weight (push ups, jumping jacks) stretches, jump rope for ~15 minutes. Reports "moving around all the time" the rest of the day.  30-45 mins at the gym x 3days/week;     Patient reports visual changes.   O:  Physical Exam  Constitutional: She appears well-developed and well-nourished.  Vitals reviewed.    Review of Systems  All other systems reviewed and are negative.    Lab Results  Component Value Date   HGBA1C 9.3 05/30/2017   There were no vitals filed for this visit.  Checks sugars one-two times daily depending on how "her body feels", usually sees in the 100s, sometimes in the 200s   A/P: Diabetes longstanding currently uncontrolled with A1c 9.3%. Patient reports rare (<1 x per month) hypoglycemic events and is able to verbalize appropriate hypoglycemia management plan. Patient reports adherence with medication, except for with Victoza. Control is suboptimal due to medication adherence, poor diet, and lack of insulin dose understanding due to not checking sugars consistently at home. -Change metformin to XR 500 mg daily x 1 week, then increase to XR 1000 mg daily to help combat GI side effects. -Change victoza 1.8 mg injected daily to Trulicity 5.42 mg injected weekly. Advised patient to continue using Victoza consistently until able to obtain Trulicity via the [facility] -Discussed potential of CGM today, possibly to be placed at follow-up visit, once Trulicity is started -Performed POC A1C today -  uncontrolled, 9.3% -Next A1C anticipated January 2019.    Follow up with new PCP for re-prescribing of methylphenidate - possibly a longer-acting formulation next week.   Written patient instructions provided.  Total time in face to face counseling 45 minutes.   Follow up in Pharmacist Clinic Visit in ~1 month.   Patient seen with Cleotis Lema, PharmD Candidate,  and Deirdre Pippins, PharmD PGY-2 Resident.

## 2017-05-30 NOTE — Patient Instructions (Addendum)
Thank you for coming to see [name] today! Your A1C is up to 9.3 today, likely because of a combination of things including medication changes and inconsistently taking Victoza.  1. STOP the immediate release metformin  2. START Metformin XR 1 tablet (500 mg) once a day for one week, then change to 2 tablets (1000 mg) once a day.   3. STOP Victoza once Trulicity comes in. You can keep taking Victoza 1.8 mg every day until the Trulicity comes in.  4. START Trulicity once it comes in - 1 injection once a week.   5. Continue taking your insulins like you have been and taking your blood sugars at least once a day.  6. We would LOVE to try out continuous blood glucose monitoring [facility]) in you so we can see where your sugars are at all times of day, during/after exercise, etc. Ideally, we can talk about this after you get your trulicity and you have been on it for a week or two.   Please schedule next week to see Dr. Avon Gully. Come back to see [name] in ~ 1 month.

## 2017-05-30 NOTE — Assessment & Plan Note (Signed)
Diabetes longstanding currently uncontrolled with A1c 9.3%. Patient reports rare (<1 x per month) hypoglycemic events and is able to verbalize appropriate hypoglycemia management plan. Patient reports adherence with medication, except for with Victoza. Control is suboptimal due to medication adherence, poor diet, and lack of insulin dose understanding due to not checking sugars consistently at home. -Change metformin to XR 500 mg daily x 1 week, then increase to XR 1000 mg daily to help combat GI side effects. -Change victoza 1.8 mg injected daily to Trulicity 0.56 mg injected weekly. Advised patient to continue using Victoza consistently until able to obtain Trulicity via the [facility] -Discussed potential of CGM today, possibly to be placed at follow-up visit, once Trulicity is started

## 2017-06-03 ENCOUNTER — Ambulatory Visit (INDEPENDENT_AMBULATORY_CARE_PROVIDER_SITE_OTHER): Payer: Self-pay | Admitting: Internal Medicine

## 2017-06-03 ENCOUNTER — Encounter: Payer: Self-pay | Admitting: Internal Medicine

## 2017-06-03 DIAGNOSIS — F902 Attention-deficit hyperactivity disorder, combined type: Secondary | ICD-10-CM

## 2017-06-03 MED ORDER — METHYLPHENIDATE HCL ER (LA) 30 MG PO CP24: 30.0000 mg | ORAL_CAPSULE | ORAL | 0 refills | Status: DC

## 2017-06-03 MED ORDER — METHYLPHENIDATE HCL ER (LA) 10 MG PO CP24: 10.0000 mg | ORAL_CAPSULE | Freq: Every day | ORAL | 0 refills | Status: DC

## 2017-06-03 MED ORDER — METHYLPHENIDATE HCL 10 MG PO TABS: 10.0000 mg | ORAL_TABLET | Freq: Every day | ORAL | 0 refills | Status: DC

## 2017-06-03 NOTE — Progress Notes (Signed)
   Subjective:   Patient: Tracy Weiss       Birthdate: February 15, 1966       MRN: [id number]      HPI  Tracy Weiss is a 51 y.o. female presenting for ADHD f/u.   Patient with known diagnosis of ADHD. Has been taking Ritalin LA 30mg  in the morning. Only takes on week days. Says that now by lunchtime she is having difficulty focusing. Says she feels "all over the place." Denies tachycardia, palpitations, insomnia, weight loss. Says she has taken Stratera in the past but cannot remember why she switched to Ritalin. Has difficulty affording medications so needs to be cost conscious with which med she is prescribed.   Smoking status reviewed. Patient is former smoker.   Review of Systems See HPI.     Objective:  Physical Exam  Constitutional: She is oriented to person, place, and time and well-developed, well-nourished, and in no distress.  HENT:  Head: Normocephalic and atraumatic.  Eyes: Conjunctivae and EOM are normal. Right eye exhibits no discharge. Left eye exhibits no discharge.  Pulmonary/Chest: Effort normal. No respiratory distress.  Neurological: She is alert and oriented to person, place, and time.  Skin: Skin is warm and dry.  Psychiatric: Affect and judgment normal.      Assessment & Plan:  Attention deficit disorder Effects of medication wearing off around lunchtime. Currently only taking 30mg . Cost is an issue. Will continue Ritalin LA 30mg  in AM, but begin to add Ritalin IR 10mg  at lunch time. Can add 10 mg at lunch for one week. If no improvement in symptoms, increase by 10mg  each week until 30mg  at lunch total. If patient tolerating 30mg  well, can consider switching back to Ritalin LA at lunch time if this is more cost effective for patient.   Precepted with Dr. Erin Hearing.   Adin Hector, MD, MPH PGY-3 [facility] Pager [phone]

## 2017-06-03 NOTE — Assessment & Plan Note (Signed)
Effects of medication wearing off around lunchtime. Currently only taking 30mg . Cost is an issue. Will continue Ritalin LA 30mg  in AM, but begin to add Ritalin IR 10mg  at lunch time. Can add 10 mg at lunch for one week. If no improvement in symptoms, increase by 10mg  each week until 30mg  at lunch total. If patient tolerating 30mg  well, can consider switching back to Ritalin LA at lunch time if this is more cost effective for patient.

## 2017-06-03 NOTE — Patient Instructions (Signed)
It was nice meeting you today Tracy Weiss!  Please continue taking your morning dose of 30mg  Ritalin. For the rest of this week, you can add one 10mg  tablet at lunch time. If you are still experiencing symptoms, you can add a second 10mg  tablet at lunch time after a week (20mg  total at lunch). If you are still having symptoms, you can add a third 10mg  tablet after a week (30mg  total at lunch).   I will see you back in a month to see how you are doing on the increased dose.   If you have any questions or concerns, please feel free to call the clinic.   Be well,  Dr. Avon Gully

## 2017-06-10 ENCOUNTER — Other Ambulatory Visit: Payer: Self-pay | Admitting: Internal Medicine

## 2017-06-10 ENCOUNTER — Ambulatory Visit
Admission: RE | Admit: 2017-06-10 | Discharge: 2017-06-10 | Disposition: A | Discharge status: Home / Self Care | Payer: Self-pay | Source: Ambulatory Visit | Attending: *Deleted | Admitting: *Deleted

## 2017-06-10 ENCOUNTER — Ambulatory Visit
Admission: RE | Admit: 2017-06-10 | Discharge: 2017-06-10 | Disposition: A | Discharge status: Home / Self Care | Payer: No Typology Code available for payment source | Source: Ambulatory Visit | Attending: *Deleted | Admitting: *Deleted

## 2017-06-10 DIAGNOSIS — Z1231 Encounter for screening mammogram for malignant neoplasm of breast: Secondary | ICD-10-CM

## 2017-07-01 ENCOUNTER — Ambulatory Visit: Payer: Self-pay | Admitting: Pharmacist

## 2017-08-05 ENCOUNTER — Other Ambulatory Visit: Payer: Self-pay | Admitting: Internal Medicine

## 2017-08-08 ENCOUNTER — Telehealth: Payer: Self-pay | Admitting: Family Medicine

## 2017-08-08 NOTE — Telephone Encounter (Signed)
**  After Hours/ Emergency Line Call*  Received a call to report that Tracy Weiss was having increased urination after her menstrual cycle ended. She has been having accidents. Blood sugars have been elevated as well, in 300's which is unsual for her. She is concerned she has a UTI.  Endorses some burning with urination. Denying fevers, chills, flank pain. Recommended that she be seen in clinic tomorrow morning to ensure there is no UTI.  Red flags discussed.  Will forward to PCP.  Appointment made for 11:30 am 12/7 tomorrow with Dr. Gwendlyn Deutscher.  Steve Rattler, DO PGY-2, [facility]

## 2017-08-09 ENCOUNTER — Encounter: Payer: Self-pay | Admitting: Family Medicine

## 2017-08-09 ENCOUNTER — Ambulatory Visit (INDEPENDENT_AMBULATORY_CARE_PROVIDER_SITE_OTHER): Payer: Self-pay | Admitting: Family Medicine

## 2017-08-09 ENCOUNTER — Other Ambulatory Visit: Payer: Self-pay

## 2017-08-09 VITALS — BP 118/80 | HR 92 | Temp 97.3°F | Ht 61.0 in | Wt 187.0 lb

## 2017-08-09 DIAGNOSIS — Z23 Encounter for immunization: Secondary | ICD-10-CM

## 2017-08-09 DIAGNOSIS — Z794 Long term (current) use of insulin: Secondary | ICD-10-CM

## 2017-08-09 DIAGNOSIS — R350 Frequency of micturition: Secondary | ICD-10-CM

## 2017-08-09 DIAGNOSIS — E119 Type 2 diabetes mellitus without complications: Secondary | ICD-10-CM

## 2017-08-09 LAB — POCT URINALYSIS DIP (MANUAL ENTRY)
Bilirubin, UA: NEGATIVE
Glucose, UA: 500 mg/dL — AB
Ketones, POC UA: NEGATIVE mg/dL
Leukocytes, UA: NEGATIVE
Nitrite, UA: NEGATIVE
Protein Ur, POC: NEGATIVE mg/dL
Spec Grav, UA: 1.025 (ref 1.010–1.025)
Urobilinogen, UA: 0.2 E.U./dL
pH, UA: 6 (ref 5.0–8.0)

## 2017-08-09 LAB — GLUCOSE, POCT (MANUAL RESULT ENTRY): POC Glucose: 254 mg/dl — AB (ref 70–99)

## 2017-08-09 MED ORDER — INSULIN ASPART 100 UNIT/ML FLEXPEN: 10.0000 [IU] | PEN_INJECTOR | Freq: Three times a day (TID) | SUBCUTANEOUS | 10 refills | Status: DC

## 2017-08-09 NOTE — Progress Notes (Signed)
cbg  

## 2017-08-09 NOTE — Progress Notes (Signed)
Subjective:     Patient ID: Tracy Weiss, female   DOB: June 24, 1966, 51 y.o.   MRN: [id number]  Urinary Tract Infection   This is a new problem. The current episode started in the past 7 days (increase urine frequency with no burning for 4 days. She does feel some pressure with urinating). The problem occurs every urination. The problem has been unchanged (but getting better today). Quality: Pressure. The pain is moderate. There has been no fever. She is not sexually active. There is no history of pyelonephritis. Pertinent negatives include no chills, discharge, flank pain, hematuria, hesitancy, nausea or vomiting. Associated symptoms comments: 08/04/17 LMP. She has tried nothing for the symptoms. There is no history of recurrent UTIs.   DM2: She is compliant with her meds. Currently on Apidra 10 units TID, Lantus 30 units qhs, Victoza yet to start Trulicity and Metformin XL 1000 daily. She stated Apidra is not working well for her. Her glucose runs in the 300s or more. She was previously on Novolog 10 units TID but was switched to Apidra. She will like to get back on novolog in addition to other meds she is on. She denies polydipsia.  Current Outpatient Medications on File Prior to Visit  Medication Sig Dispense Refill  . Insulin Glargine (LANTUS) 100 UNIT/ML Solostar Pen Inject 15 Units into the skin daily at 10 pm. (Patient taking differently: Inject 30 Units into the skin daily at 10 pm. ) 15 mL 11  . Insulin Glulisine (APIDRA) 100 UNIT/ML Solostar Pen Inject 10 Units into the skin 3 (three) times daily with meals. (Patient taking differently: Inject 20 Units into the skin 3 (three) times daily with meals. ) 15 mL 11  . metFORMIN (GLUCOPHAGE XR) 500 MG 24 hr tablet Take 1 tablet (500 mg) by mouth once a day x 1 week, then increase to 2 tablets (1000 mg) by mouth once daily. 60 tablet 3  . ALPRAZolam (XANAX) 0.25 MG tablet Take 1 tablet (0.25 mg total) by mouth 2 (two) times daily as needed for anxiety. 30  tablet 0  . cycloSPORINE (RESTASIS) 0.05 % ophthalmic emulsion Place 1 drop into both eyes 2 (two) times daily as needed (dry eyes).    . Dulaglutide (TRULICITY) 2.95 AO/1.3YQ SOPN Inject 0.75 mg into the skin once a week. (Patient not taking: Reported on 08/09/2017) 4 pen 1  . methylphenidate (RITALIN LA) 10 MG 24 hr capsule Take 1 capsule (10 mg total) by mouth daily with lunch. 60 capsule 0  . methylphenidate (RITALIN LA) 30 MG 24 hr capsule Take 1 capsule (30 mg total) by mouth every morning. Fill 30 days after prior refill. 30 capsule 0  . methylphenidate (RITALIN LA) 30 MG 24 hr capsule Take 1 capsule (30 mg total) by mouth every morning. Fill 30 days after prior refill. 30 capsule 0  . methylphenidate (RITALIN) 10 MG tablet Take 1 tablet (10 mg total) by mouth daily with lunch. 90 tablet 0  . Prenatal Vit-Fe Fumarate-FA (MULTIVITAMIN-PRENATAL) 27-0.8 MG TABS tablet Take 1 tablet by mouth daily at 12 noon.     No current facility-administered medications on file prior to visit.    Past Medical History:  Diagnosis Date  . Diabetes mellitus without complication ([place])   . Gallstones 09/2016   Vitals:   08/09/17 1129  BP: 118/80  Pulse: 92  Temp: (!) 97.3 F (36.3 C)  TempSrc: Oral  SpO2: 98%  Weight: 187 lb (84.8 kg)  Height: 5\' 1"  (1.549 m)  Review of Systems  Constitutional: Negative for chills.  Respiratory: Negative.   Cardiovascular: Negative.   Gastrointestinal: Negative for nausea and vomiting.  Genitourinary: Negative for flank pain, hematuria and hesitancy.  Musculoskeletal: Negative.        Objective:   Physical Exam  Constitutional: She appears well-developed. No distress.  Cardiovascular: Normal rate, regular rhythm and normal heart sounds.  No murmur heard. Pulmonary/Chest: Effort normal and breath sounds normal. No respiratory distress. She has no wheezes.  Abdominal: Soft. Bowel sounds are normal. She exhibits no distension and no mass. There is no  tenderness.  Musculoskeletal: She exhibits no edema.  Nursing note and vitals reviewed.  .urinal    Assessment:     Polyuria DM 2 Health maintenance    Plan:     UTI vs due to hyperglycemia. UA checked neg for Nitrite or leukocyte. CBG today was >200 Plan to continue Metformin, Lantus and Victoza( switch to Trulicity as planned). Trial of Novolog 10 units TID while holding Apidra for the next 1-2 weeks. F/U with Dr. Valentina Lucks for medication adjustment in 1-2 weeks. Continue home CBG monitoring. Hold insulin if less than 90 and contact our office. She agreed with plan.  Novolog flex pen sample  Was given today.     Flu shot given for health maintenance.

## 2017-08-09 NOTE — Patient Instructions (Signed)
It was nice meeting you today. It does not seem as though you have urine infection. Your increase urine frequency may be due to hyperglycemia. Please use Novolog 10 units TID and hold  Apidra in the mean time. See Dr. Valentina Lucks in 2 weeks for medication adjustment. F/U as needed.

## 2017-08-15 ENCOUNTER — Ambulatory Visit: Payer: Self-pay

## 2017-08-15 ENCOUNTER — Ambulatory Visit: Payer: Self-pay | Admitting: Pharmacist

## 2017-08-22 ENCOUNTER — Encounter: Payer: Self-pay | Admitting: Pharmacist

## 2017-08-22 ENCOUNTER — Other Ambulatory Visit: Payer: Self-pay

## 2017-08-22 ENCOUNTER — Ambulatory Visit (INDEPENDENT_AMBULATORY_CARE_PROVIDER_SITE_OTHER): Payer: Self-pay | Admitting: Pharmacist

## 2017-08-22 DIAGNOSIS — E119 Type 2 diabetes mellitus without complications: Secondary | ICD-10-CM

## 2017-08-22 DIAGNOSIS — Z794 Long term (current) use of insulin: Secondary | ICD-10-CM

## 2017-08-22 MED ORDER — INSULIN ASPART 100 UNIT/ML ~~LOC~~ SOLN
10.0000 [IU] | Freq: Three times a day (TID) | SUBCUTANEOUS | 0 refills | Status: DC
Start: 2017-08-22 — End: 2018-09-26

## 2017-08-22 NOTE — Patient Instructions (Signed)
Thanks for coming to see [name] today.   No changes to your insulins, you're doing much better.   We will call [facility] and see about a prescription for Novolog. Our plan in the future is to switch you to Ozempic. Ozempic would replace Trulicity. We will see if you tolerate it before we send a prescription to [facility].   Come back to see [name] on 09/05/17 so we can switch you to Ozempic.

## 2017-08-22 NOTE — Progress Notes (Signed)
    S:     Chief Complaint  Patient presents with  . Diabetes    Patient arrives in good spirits, ambulating without assistance .  Presents for diabetes evaluation, education, and management at the request of Dr. Gerlean Ren. Patient was referred on 08/13/2016.  Patient was last seen by Primary Care Provider on 08/09/2016 for c/o urinary frequency and concern for UTI - found to NOT have infection and polyuria thought to be 2/2 hyperglycemia so apidra was d/c'd and changed to novolog. Patient was given samples as she obtains medications from [facility] and they were unable to provide her with novolog at that time.   Patient reports her CBGs have been much improved on novolog and with the Trulicity. Expresses need for novolog samples. Denies GI upset, n/v, or hypoglycemia. Has two more Trulicity 9.03 mg pens left - gives doses on Sundays. Reports her eye sight has improved. Does bring up concern over continued weight gain and inquires if Trulicity is the best option to manage weight. 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 Will also give a correction bolus if fasting CBG is >150 mg/dL.   Patient reports adherence with medications.  Current diabetes medications include: Lantus 30 units daily, novolog 10 units TID with meals + 5 unit correction bolus for FBG > 150  Current hypertension medications include: none  Patient denies hypoglycemic events.  Patient reported dietary habits: Eats 3 meals/day  O:  Physical Exam  Constitutional: She appears well-developed and well-nourished.   Review of Systems  All other systems reviewed and are negative.   Lab Results  Component Value Date   HGBA1C 9.3 05/30/2017   Vitals:   08/22/17 1059  BP: 120/64  Pulse: 86  Temp: 98 F (36.7 C)  SpO2: 98%    CBGs per meter 30 d avg 230, 14 d avg 225; Fastings 160s-250s, excursions to 276 Random/pre-meal 180s-250s, excursion to 300.    A/P: Diabetes longstanding currently uncontrolled but improved per patient reported CBGs. Patient denies hypoglycemic events and is able to verbalize appropriate hypoglycemia management plan. Patient reports adherence with medication. Control is suboptimal due to h/o medication adherence, poor diet, and shaky understanding of insulin release kinetics.  Continued basal insulin Lantus (insulin glargine) 30 units daily. Continued rapid insulin Novolog (insulin aspart) at 10 units TID + fasting correction bolus. Sample of Novolog vial and syringes provided. Will call [facility] to see if Novolog can be obtained for patient.   Continued Trulicity (dulaglutide) at 0.75 mg daily. Plan to switch this to Ozempic at next visit after she exhausts Trulicity supply for improved weight loss efficacy. Will see how she tolerates Ozempic as a sample before sending Rx to [facility]. Next A1C anticipated at next visit.    Written patient instructions provided.  Total time in face to face counseling 20 minutes.   Follow up in Pharmacist Clinic Visit early Jan.   Patient seen with Deirdre Pippins, PGY2 Pharmacy Resident, PharmD, BCPS

## 2017-08-22 NOTE — Assessment & Plan Note (Signed)
Diabetes longstanding currently uncontrolled but improved per patient reported CBGs. Patient denies hypoglycemic events and is able to verbalize appropriate hypoglycemia management plan. Patient reports adherence with medication. Control is suboptimal due to h/o medication adherence, poor diet, and shaky understanding of insulin release kinetics.  Continued basal insulin Lantus (insulin glargine) 30 units daily. Continued rapid insulin Novolog (insulin aspart) at 10 units TID + fasting correction bolus. Sample of Novolog vial and syringes provided. Will call [facility] to see if Novolog can be obtained for patient.   Continued Trulicity (dulaglutide) at 0.75 mg daily. Plan to switch this to Ozempic at next visit after she exhausts Trulicity supply for improved weight loss efficacy. Will see how she tolerates Ozempic as a sample before sending Rx to [facility].

## 2017-08-23 ENCOUNTER — Other Ambulatory Visit: Payer: Self-pay | Admitting: Internal Medicine

## 2017-08-23 ENCOUNTER — Telehealth: Payer: Self-pay | Admitting: Internal Medicine

## 2017-08-23 MED ORDER — ATORVASTATIN CALCIUM 10 MG PO TABS: 10.0000 mg | ORAL_TABLET | Freq: Every day | ORAL | 3 refills | Status: DC

## 2017-08-23 NOTE — Telephone Encounter (Signed)
Inquired about gastric band surgery she was interested in and was qualified to have done in 2015 or 2016.  She would like to consider that option again in the near future.  Discussed this being a PCP visit -Tracy Weiss suggested for Spring 2019.   Patient accepted plan to continue her current plan with medications prescribed yesterday and then consider gastric bypass again in the future with PCP.

## 2017-08-23 NOTE — Progress Notes (Signed)
Received request from patient's pharmacy regarding starting patient on statin as she has diabetes. Feel that this is appropriate. Prescription for atorvastatin 10mg  #90 with 3 refills sent to requested pharmacy.   Adin Hector, MD, MPH PGY-3 [facility] Pager [phone]

## 2017-08-23 NOTE — Telephone Encounter (Signed)
Would like to talk to dr Valentina Lucks.  She has several questions for him

## 2017-08-28 ENCOUNTER — Ambulatory Visit: Payer: Self-pay

## 2017-09-04 ENCOUNTER — Ambulatory Visit: Payer: Self-pay

## 2017-09-05 ENCOUNTER — Ambulatory Visit (INDEPENDENT_AMBULATORY_CARE_PROVIDER_SITE_OTHER): Payer: Self-pay | Admitting: Pharmacist

## 2017-09-05 DIAGNOSIS — E119 Type 2 diabetes mellitus without complications: Secondary | ICD-10-CM

## 2017-09-05 DIAGNOSIS — Z794 Long term (current) use of insulin: Secondary | ICD-10-CM

## 2017-09-05 MED ORDER — INSULIN ASPART 100 UNIT/ML FLEXPEN: 10.0000 [IU] | PEN_INJECTOR | Freq: Three times a day (TID) | SUBCUTANEOUS | 0 refills | Status: DC

## 2017-09-05 MED ORDER — SEMAGLUTIDE(0.25 OR 0.5MG/DOS) 2 MG/1.5ML ~~LOC~~ SOPN: 0.2500 mg | PEN_INJECTOR | SUBCUTANEOUS | 4 refills | Status: DC

## 2017-09-05 NOTE — Patient Instructions (Addendum)
Thanks for coming to see [name]!   We will call [facility] to see if they have made any progress with the Novolog pens.   We sent a prescription to [facility] for ozempic. When you get this, you will start at 0.25 mg once a week.  In the meantime, use EITHER Victoza 1.8 mg once a day OR Trulicity 2.49 mg once weekly  We want to see you back shortly after you get your ozempic. If you start seeing low blood sugars, call in so we can drop down your insulin.

## 2017-09-05 NOTE — Progress Notes (Signed)
    S:     Chief Complaint  Patient presents with  . Medication Management    Patient arrives in good spirits, ambulating without assistance. Presents for diabetes evaluation, education, and management at the request of Dr. Gerlean Ren. Patient was referred on 08/13/2016.  Patient was last seen by Primary Care Provider on 08/09/2016. Last pharmacy clinic visit 08/22/2017 - at that time, no changes were made to insulin regimen and plan was made to transition Trulicity to Ozempic at next visit.   Today, patient reports that she really likes the Trulicity, reports she notices the biggest difference in appetite and CBG control during the first few days after her dose. She was due for a dose of Trulicity last Sunday but has held in anticipation of being switched to Ozempic this visit. Reports she has 1 pen of Trulicity and 2 pens of Victoza at home. Reports variable control of blood sugars, down to 130s at lowest, has had several in the 300s when she had forgotten her medications.   Inquires if she could increase Victoza to two 1.8 mg shots in 1 day to help with weight loss.   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  Current diabetes medications include: Lantus 30 units daily, novolog 10 units TID with meals + 5 unit correction bolus for FBG > 150  Current hypertension medications include: none  Patient denies hypoglycemic events.  O:  Physical Exam  Constitutional: She appears well-developed and well-nourished.     Review of Systems  All other systems reviewed and are negative.   Lab Results  Component Value Date   HGBA1C 9.3 05/30/2017   Vitals:   09/05/17 1459  BP: 124/74  Pulse: 92  SpO2: 98%    14d avg- 287  30d avg- 274   A/P: Diabetes longstanding currently uncontrolled and worsened CBGs averages. Patient denies hypoglycemic events and is able to verbalize appropriate hypoglycemia management plan. Patient  reports adherence with medication. Control is suboptimal due to h/o medication nonadherence, poor diet, and shaky understanding of insulin release kinetics. Tolerating Trulicity well.  -Continued basal insulin Lantus (insulin glargine) 30 units daily. Continued rapid insulin Novolog (insulin aspart) at 10 units TID + fasting correction bolus. Sample of 2 Novolog pens provided.   -Sent prescription for Ozempic 0.25 mg once weekly to [facility], instructed patient to continue EITHER Trulicity 7.59 mg once weekly OR Victoza 1.8 mg daily until Ozempic comes in.   -Called [facility] to clarify above GLP-1 plan and follow up on novolog pen applications - left message requesting they return my phone call.  Next A1C anticipated at next visit.   Written patient instructions provided.  Total time in face to face counseling 30 minutes.   Follow up in Pharmacist Clinic Visit after Ozempic comes in.   Patient seen with Onnie Boer, PharmD Candidate and Deirdre Pippins, PGY2 Pharmacy Resident, PharmD, BCPS

## 2017-09-06 ENCOUNTER — Encounter: Payer: Self-pay | Admitting: Pharmacist

## 2017-09-06 ENCOUNTER — Telehealth: Payer: Self-pay | Admitting: Pharmacist

## 2017-09-06 NOTE — Telephone Encounter (Signed)
[facility] returned call, informed that Ozempic is not available through them as there is no patient assistance program at this time. There is a coupon card with max savings of $150 which would not make the drug affordable. Options include continuing Victoza or Trulicity.   Additionally, [facility] informs that they have submitted the letter of medical necessity on 08/22/2017 for Novolog Pens to [employer]. Awaiting response.   Called [employer] to follow up on status of above request. They report that her original application expired in July 2018. Need new application and current proof of income to renew. If no income, will need medicaid denial letter. Relayed this message to [facility].   I will follow up with patient with GLP-1 plan after discussion with Dr. Valentina Lucks and relay information above about need for documentation.    Carlean Jews, Pharm.D., BCPS PGY2 Ambulatory [facility] Pharmacy Resident Phone: [phone]

## 2017-09-06 NOTE — Assessment & Plan Note (Signed)
Diabetes longstanding currently uncontrolled and worsened CBGs averages. Patient denies hypoglycemic events and is able to verbalize appropriate hypoglycemia management plan. Patient reports adherence with medication. Control is suboptimal due to h/o medication nonadherence, poor diet, and shaky understanding of insulin release kinetics. Tolerating Trulicity well.  -Continued basal insulin Lantus (insulin glargine) 30 units daily. Continued rapid insulin Novolog (insulin aspart) at 10 units TID + fasting correction bolus. Sample of 2 Novolog pens provided.   -Sent prescription for Ozempic 0.25 mg once weekly to [facility], instructed patient to continue EITHER Trulicity 0.98 mg once weekly OR Victoza 1.8 mg daily until Ozempic comes in.   -Called [facility] to clarify above GLP-1 plan and follow up on novolog pen applications - left message requesting they return my phone call.  Next A1C anticipated at next visit

## 2017-09-09 MED ORDER — DULAGLUTIDE 1.5 MG/0.5ML ~~LOC~~ SOAJ: 1.5000 mg | SUBCUTANEOUS | 11 refills | Status: DC

## 2017-09-09 NOTE — Telephone Encounter (Signed)
Returned call from me earlier in the PM - Called to clarify patient preference for trulicty vs Vicotza.  Patient interested in continuing weekly Trulicity at the higher dose.     Communicated I would send a new prescription for the higher dose of Turlicity to [facility] for them to process.   Patient verbalized understanding of treatment plan.

## 2017-09-17 ENCOUNTER — Telehealth: Payer: Self-pay | Admitting: Internal Medicine

## 2017-09-17 NOTE — Telephone Encounter (Signed)
Pt is concerned with the way she has been feeling lately. She says she just hasn't been feeling herself. She gets winded when she walks and gets out of breath easily. Pt mentioned she's worried it may be a blood clot, but I'm not sure what makes her think its a blood clot. She said she had an episode a few weeks back where she was sweating, throwing up and using the bathroom all at once and she thought she was having a heart attack. I made her an appointment for 1/21 at 3:30 w/ Avon Gully, but patient is very concerned. She was wanting to know if there are any test she should be sent for before this appointment so Avon Gully can have the results for that appointment. Please return patients call to discuss this with her. Please advise

## 2017-09-18 NOTE — Telephone Encounter (Signed)
As patient has an appt at the beginning of next week, we can wait until then to discuss her concerns. No labwork indicated prior to that appt.   Adin Hector, MD, MPH PGY-3 [facility] Pager [phone]

## 2017-09-18 NOTE — Telephone Encounter (Signed)
LVM for patient telling her that per Dr. Avon Gully the health issues that she has can be discussed at her appointment on 09/23/2017 and that there are no lab test that need to be done prior.Ozella Almond, CMA

## 2017-09-19 ENCOUNTER — Telehealth: Payer: Self-pay | Admitting: Pharmacist

## 2017-09-19 NOTE — Telephone Encounter (Signed)
Received phone call form [facility] stating that patient has supplies of Lantus, Novolog FlexPens, and Trulicity 1.21 mg. They also voice concerns over her adherence to medications.   Called patient to relay the above information. No answer. Left HIPAA-compliant VM requesting she return my phone call.   Carlean Jews, Pharm.D., BCPS PGY2 Ambulatory Care Pharmacy Resident Phone: [phone]

## 2017-09-22 ENCOUNTER — Telehealth: Payer: Self-pay | Admitting: Family Medicine

## 2017-09-22 NOTE — Telephone Encounter (Signed)
**  After Hours/ Emergency Line Call*  Received a call to report that Tracy Weiss is having shortness of breath with some sharp chest pain intermittently on central under breast. No diaphoresis or nausea. Last week she had a similar episode that was worse with nausea and sweating. Sugars are running high as 149. She is most worried about if she is having a heart attack because "I just don't feel quite right." Recommended that patient be evaluated in ED. Patient voiced that she would be going to [facility] ED. Will forward to PCP.   Bufford Lope, DO PGY-2, [facility] 09/22/2017 2:07 PM

## 2017-09-23 ENCOUNTER — Encounter: Payer: Self-pay | Admitting: Internal Medicine

## 2017-09-23 ENCOUNTER — Ambulatory Visit (INDEPENDENT_AMBULATORY_CARE_PROVIDER_SITE_OTHER): Payer: BLUE CROSS/BLUE SHIELD | Admitting: Internal Medicine

## 2017-09-23 VITALS — BP 101/80 | HR 87 | Temp 98.0°F | Wt 189.6 lb

## 2017-09-23 DIAGNOSIS — E785 Hyperlipidemia, unspecified: Secondary | ICD-10-CM

## 2017-09-23 DIAGNOSIS — R002 Palpitations: Secondary | ICD-10-CM | POA: Diagnosis not present

## 2017-09-23 DIAGNOSIS — F411 Generalized anxiety disorder: Secondary | ICD-10-CM | POA: Diagnosis not present

## 2017-09-23 MED ORDER — OLMESARTAN MEDOXOMIL-HCTZ 40-25 MG PO TABS: 1.0000 | ORAL_TABLET | Freq: Every day | ORAL | 3 refills | Status: DC

## 2017-09-23 MED ORDER — FLUOXETINE HCL 20 MG PO CAPS: 20.0000 mg | ORAL_CAPSULE | Freq: Every day | ORAL | 3 refills | Status: DC

## 2017-09-23 MED FILL — FLUoxetine HCL 20 MG CAPS: 20 | 30 days supply | Qty: 30 | Fill #0

## 2017-09-23 NOTE — Patient Instructions (Addendum)
It was nice seeing you again today Tracy Weiss!  Please begin taking fluoxetine one tablet daily to help with anxiety. It can take up to 6 weeks before you start seeing the effects, so do not stop taking it if you don't see immediate improvement in your symptoms. I will see you back in 6 weeks to see if this is working well for you or if we need to make a change.   I will call you if there are any abnormalities with the bloodwork we have drawn today.   If you have any questions or concerns, please feel free to call the clinic.   Be well,  Dr. Avon Gully

## 2017-09-23 NOTE — Progress Notes (Signed)
   Subjective:   Patient: Tracy Weiss       Birthdate: 03/24/1966       MRN: [id number]      HPI  Tracy Weiss is a 52 y.o. female presenting for multiple symptoms.   Patient reports a variety of symptoms over the past few months. Endorses tightness in her chest for which she called the after hours line last night. Was encouraged to go to ED, however patient says tightness subsided, so she did not go. Also reports SOB when laying down and when walking up stairs. Has had L foot swelling. Also with intermittent nausea and dizziness. Endorses palpitations that resolve spontaneously. Says in generally she is "not feeling right." Patient says that typically a number of these symptoms will come on suddenly. Also reports feeling very overwhelmed at times, and recently feeling rather depressed, though she says her mood has improved.  Of note, patient with history of GAD. She has been prescribed Xanax for this in the past but says she only takes it when she gets anxious about a test in school. She has never been on a daily medication for anxiety. Has never been diagnosed with depression.   Smoking status reviewed. Patient is former smoker.   Review of Systems See HPI.     Objective:  Physical Exam  Constitutional: She is oriented to person, place, and time and well-developed, well-nourished, and in no distress.  HENT:  Head: Normocephalic and atraumatic.  Nose: Nose normal.  Mouth/Throat: Oropharynx is clear and moist. No oropharyngeal exudate.  Cardiovascular: Normal rate, regular rhythm and normal heart sounds.  No murmur heard. Pulmonary/Chest: Effort normal and breath sounds normal. No respiratory distress. She has no wheezes.  Musculoskeletal:  No LE edema bilaterally  Neurological: She is alert and oriented to person, place, and time.  Skin: Skin is warm and dry.  Psychiatric: Affect and judgment normal.      Assessment & Plan:  Generalized anxiety disorder Variety of symptoms with sudden  onset and feeling of overwhelming most consistent with poorly controlled anxiety. Patient only prescribed Xanax (last prescribed 01/2017 by former provider) which she does not take during these episodes. Feel that starting patient on daily medication would be beneficial and likely resolve or at least improve her symptoms. Patient amenable to this.  - Begin fluoxetine 20mg  qd. Can increase dose at f/u if necessary.    Adin Hector, MD, MPH PGY-3 [facility] Pager [phone]

## 2017-09-24 ENCOUNTER — Telehealth: Payer: Self-pay | Admitting: Internal Medicine

## 2017-09-24 LAB — CBC
Hematocrit: 35.6 % (ref 34.0–46.6)
Hemoglobin: 11.8 g/dL (ref 11.1–15.9)
MCH: 28.9 pg (ref 26.6–33.0)
MCHC: 33.1 g/dL (ref 31.5–35.7)
MCV: 87 fL (ref 79–97)
Platelets: 353 10*3/uL (ref 150–379)
RBC: 4.09 x10E6/uL (ref 3.77–5.28)
RDW: 14 % (ref 12.3–15.4)
WBC: 7 10*3/uL (ref 3.4–10.8)

## 2017-09-24 LAB — LIPID PANEL
Chol/HDL Ratio: 3.1 ratio (ref 0.0–4.4)
Cholesterol, Total: 192 mg/dL (ref 100–199)
HDL: 61 mg/dL (ref >39)
LDL Calculated: 98 mg/dL (ref 0–99)
Triglycerides: 167 mg/dL — ABNORMAL HIGH (ref 0–149)
VLDL Cholesterol Cal: 33 mg/dL (ref 5–40)

## 2017-09-24 LAB — TSH: TSH: 1.08 u[IU]/mL (ref 0.450–4.500)

## 2017-09-24 NOTE — Assessment & Plan Note (Signed)
Variety of symptoms with sudden onset and feeling of overwhelming most consistent with poorly controlled anxiety. Patient only prescribed Xanax (last prescribed 01/2017 by former provider) which she does not take during these episodes. Feel that starting patient on daily medication would be beneficial and likely resolve or at least improve her symptoms. Patient amenable to this.  - Begin fluoxetine 20mg  qd. Can increase dose at f/u if necessary.

## 2017-09-24 NOTE — Telephone Encounter (Signed)
Pt said she forgot to speak with [facility] yesterday about being put on Lasics to  reduce the fluid on her body. She would like to be started on a  low dose of it. She wants to make sure that is something that doesn't cost much as well. [facility] is [facility]  [address] [place].

## 2017-09-25 ENCOUNTER — Telehealth: Payer: Self-pay | Admitting: Internal Medicine

## 2017-09-25 NOTE — Telephone Encounter (Signed)
Patient calling back. Very argumentative throughout encounter. Explained that she has no signs of fluid overload so Lasix not indicated. Also explained to patient that she is on Benicar which contains HCTZ, so if there is any fluid overload, HCTZ, as a diuretic, should help with this. (Of note, patient thinking she is fluid overloaded because she weighs more than she used to. Thinks this is because of some medication she is taking, not because of overeating or not exercising enough. Is convinced that she is taking a medication that has a "steroid effect." Discussed that there are no medications containing steroids on her list.) Patient then became very agitated that she has a diagnosis of hypertension on her problem list, and says that she was told that she is taking Benicar for heart and/or kidney protection, not for HTN. Clarified with patient that diagnosis of HTN was made in 2012, and if a diagnosis of a chronic disease is entered into a patient's chart, it remains in the chart so that we can monitor this even if it is well-controlled. Patient says this is untrue and she has never had high blood pressure in her life (document BPs above goal beginning in 2012 per Epic records). Discussed that this is not to make patient's record look bad, but to help [name] provide best care to patient. Patient wanting HTN removed from her problem list and saying that every patient needs to be a patient advocate for themselves as she is for herself. Patient saying that she does not even take Benicar every day so there is no way this could be controlling her blood pressure. Encouraged patient to take medication daily (of note, patient specifically asked that this medication be refilled during appointment two days ago), or not at all, as taking it every once in a while is not beneficial to her and wasting her money by purchasing the prescription. Patient said she will schedule appt with Dr. Valentina Lucks to discuss this further, and asked that  I schedule an appointment between Dr. Valentina Lucks, myself, and the practice administrator to clear up her medication and problem list. Informed her that this is not necessary, but that she may schedule an appointment with Dr. Valentina Lucks if she would like.   Adin Hector, MD, MPH PGY-3 [facility] Pager [phone]

## 2017-09-25 NOTE — Telephone Encounter (Signed)
Returned patient's call regarding Lasix. No answer. Left voicemail. Explained that, as we discussed in her appointment, fluid build up would occur bilaterally and would leave an indentation in her leg when pressing on it. Also reminded patient that she told me that she only had a small amount of swelling in her L foot alone, and that on exam, there was no swelling at all, and specifically no pitting edema. Told her that, as such, Lasix would not be helpful to her, as she has no fluid overload. Encouraged patient to call with any questions or concerns.   Adin Hector, MD, MPH PGY-3 [facility] Pager [phone]

## 2017-10-28 ENCOUNTER — Telehealth: Payer: Self-pay | Admitting: Internal Medicine

## 2017-10-28 MED ORDER — FLUOXETINE HCL 20 MG PO CAPS: 20.0000 mg | ORAL_CAPSULE | Freq: Every day | ORAL | 3 refills | Status: DC

## 2017-10-28 NOTE — Telephone Encounter (Signed)
Needs refill for methoxamine  Fluoxetine.  Pharmacy is [facility]

## 2017-10-28 NOTE — Telephone Encounter (Signed)
Pt is using the [facility] pharmacy. Fleeger, Salome Spotted, CMA

## 2017-10-28 NOTE — Telephone Encounter (Signed)
Please call to find out which pharmacy patient would like prescription sent to.   Adin Hector, MD, MPH PGY-3 [facility] Pager [phone]

## 2017-11-13 ENCOUNTER — Other Ambulatory Visit: Payer: Self-pay | Admitting: *Deleted

## 2017-11-13 DIAGNOSIS — Z794 Long term (current) use of insulin: Principal | ICD-10-CM

## 2017-11-13 DIAGNOSIS — E119 Type 2 diabetes mellitus without complications: Secondary | ICD-10-CM

## 2017-11-13 MED ORDER — METFORMIN HCL ER (MOD) 1000 MG PO TB24: 1000.0000 mg | ORAL_TABLET | Freq: Every day | ORAL | 3 refills | Status: DC

## 2017-11-14 ENCOUNTER — Telehealth: Payer: Self-pay | Admitting: *Deleted

## 2017-11-14 NOTE — Telephone Encounter (Signed)
Pharmacy requesting generic glucophage XR sent in because it is cheaper than glumetza. Please advise. Deseree Kennon Holter, CMA

## 2017-11-15 ENCOUNTER — Telehealth: Payer: Self-pay | Admitting: Internal Medicine

## 2017-11-15 MED FILL — FLUoxetine HCL 20 MG CAPS: 20 | 90 days supply | Qty: 90 | Fill #0

## 2017-11-15 NOTE — Telephone Encounter (Signed)
Needs refill on methylphendate (ritalin), please call when it is ready to be picked up. She has been out over a month

## 2017-11-15 NOTE — Telephone Encounter (Signed)
Patient uses Medical Assistance Program so prescription for metformin 500mg  XR cannot be sent in electronically. Called number provided to call in prescription so patient does not have to come pick it up. No answer, so left voicemail on pharmacy line, including prescription details, my name and office, my NPI, return phone number, and patient's name and birthdate. Prescribed metformin 500mg  XR two tablets (1000mg ) by mouth daily with breakfast, dispense 180 tabs with 3 refills.   Adin Hector, MD, MPH PGY-3 [facility] Pager [phone]

## 2017-11-18 ENCOUNTER — Other Ambulatory Visit: Payer: Self-pay

## 2017-11-18 MED ORDER — METHYLPHENIDATE HCL ER (LA) 30 MG PO CP24: 30.0000 mg | ORAL_CAPSULE | ORAL | 0 refills | Status: DC

## 2017-11-18 MED ORDER — METHYLPHENIDATE HCL ER (LA) 10 MG PO CP24: 10.0000 mg | ORAL_CAPSULE | Freq: Every day | ORAL | 0 refills | Status: DC

## 2017-11-18 NOTE — Telephone Encounter (Signed)
Prescription for 1 mo of Ritalin 30mg  to take in morning and Ritalin 10mg  to take at lunchtime with two additional refills prescribed. Patient will need to schedule an appointment before this will be refilled again to see how she is doing on current dosing.   Adin Hector, MD, MPH PGY-3 [facility] Pager [phone]

## 2017-11-19 MED ORDER — METFORMIN HCL ER 500 MG PO TB24: 1000.0000 mg | ORAL_TABLET | Freq: Every day | ORAL | 3 refills | Status: DC

## 2017-11-20 NOTE — Telephone Encounter (Signed)
Called patient and informed her of the RX for Ritalin and the dosage. Left voice mail and told her that if there were any questions to give [name] a call.Ozella Almond, CMA

## 2017-12-17 ENCOUNTER — Telehealth: Payer: Self-pay | Admitting: Internal Medicine

## 2018-01-07 IMAGING — MG 2D DIGITAL SCREENING BILATERAL MAMMOGRAM WITH CAD AND ADJUNCT TO
8 of 12 series · 8 of 28 positions shown · non-contrast
Comparison: Previous exam(s).

CLINICAL DATA: Screening.

EXAM:
2D DIGITAL SCREENING BILATERAL MAMMOGRAM WITH CAD AND ADJUNCT TOMO

[L CC synth-2D]
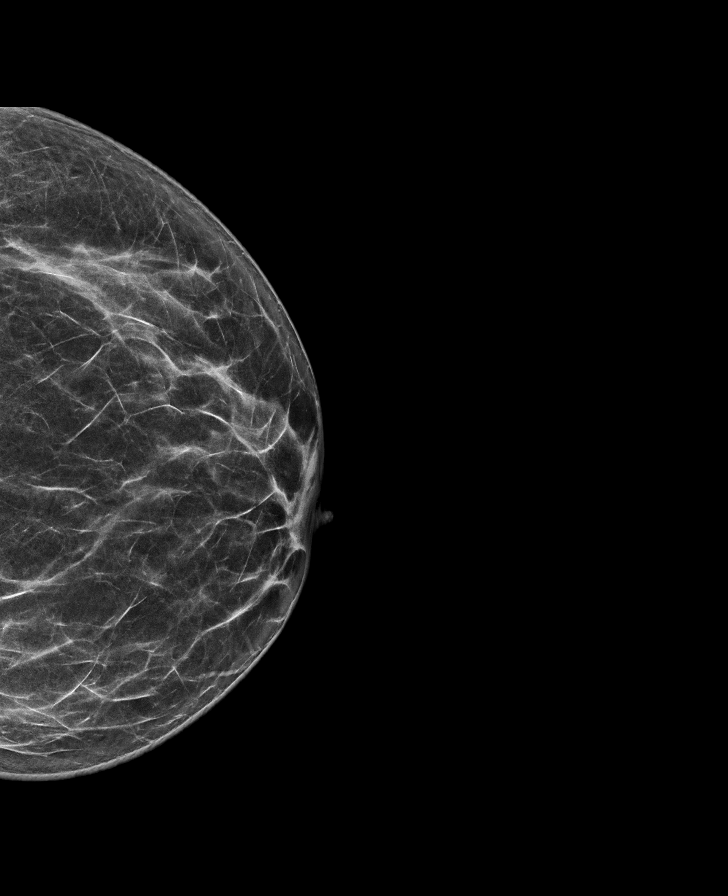

[R CC]
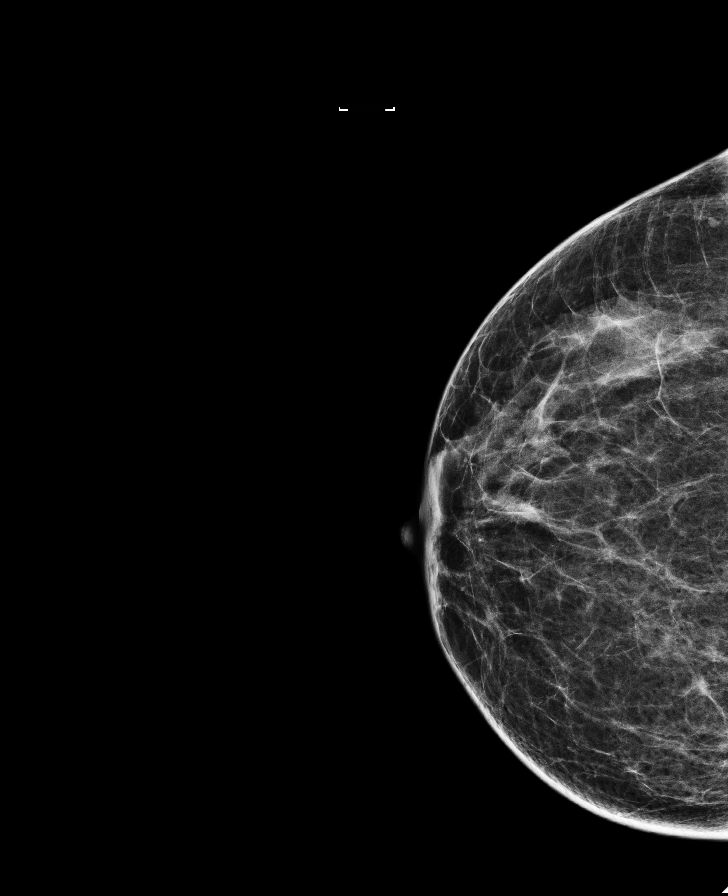

[L MLO]
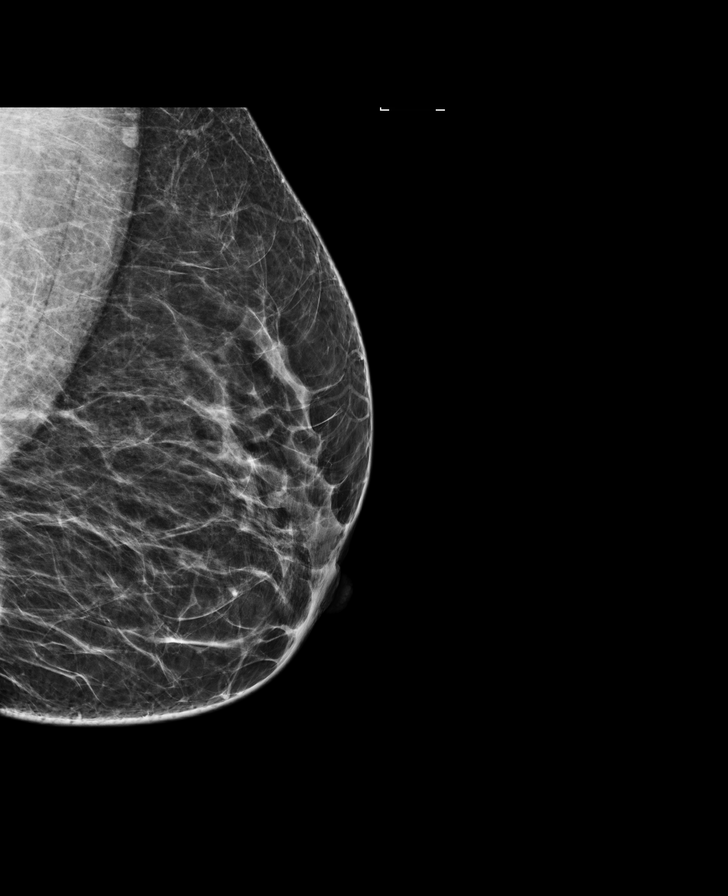

[R MLO synth-2D]
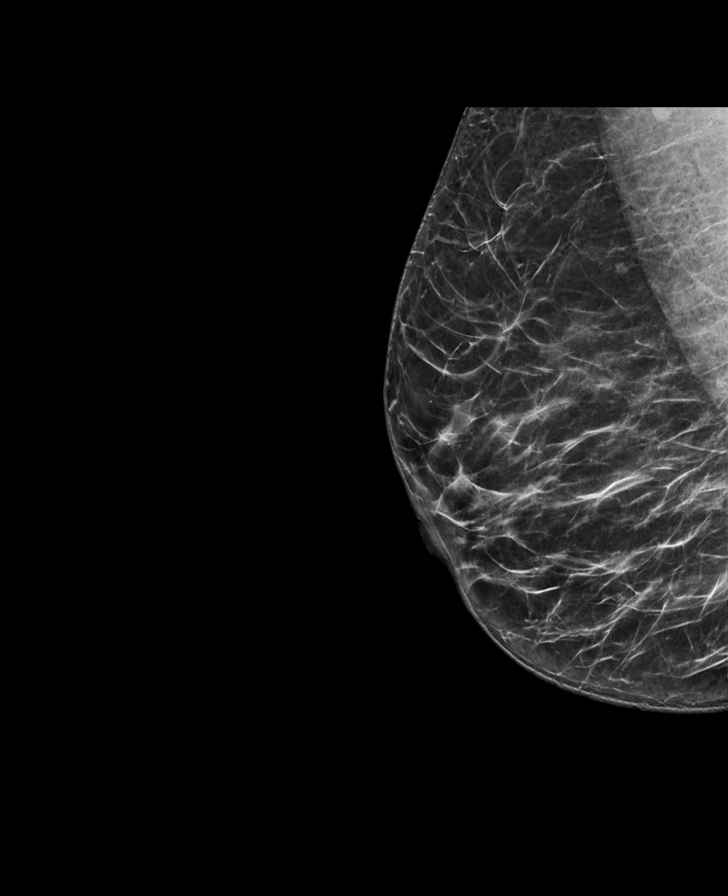

[L MLO synth-2D]
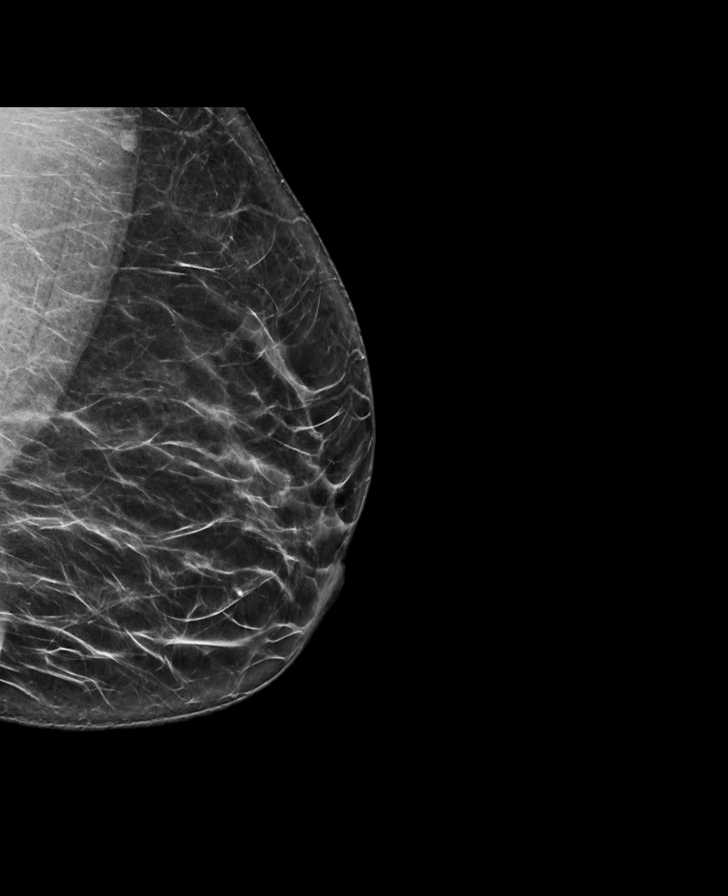

[R CC synth-2D]
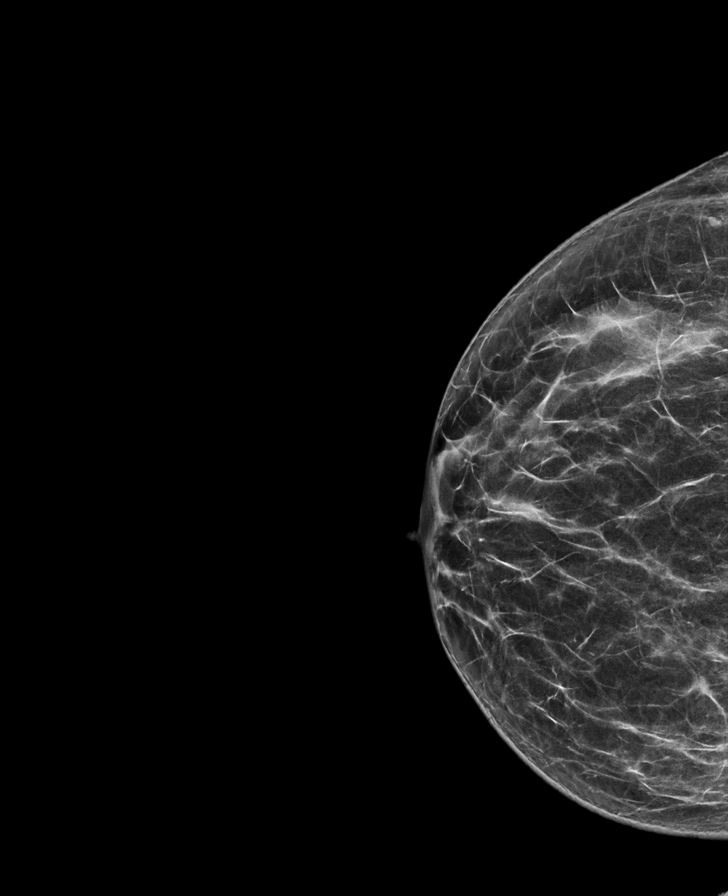

[R MLO]
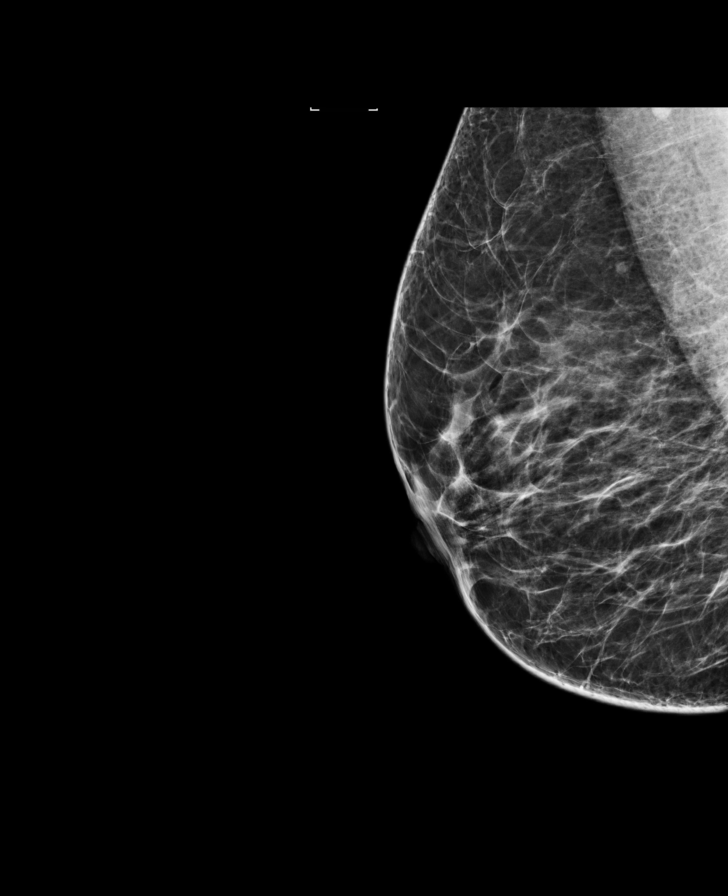

[L CC]
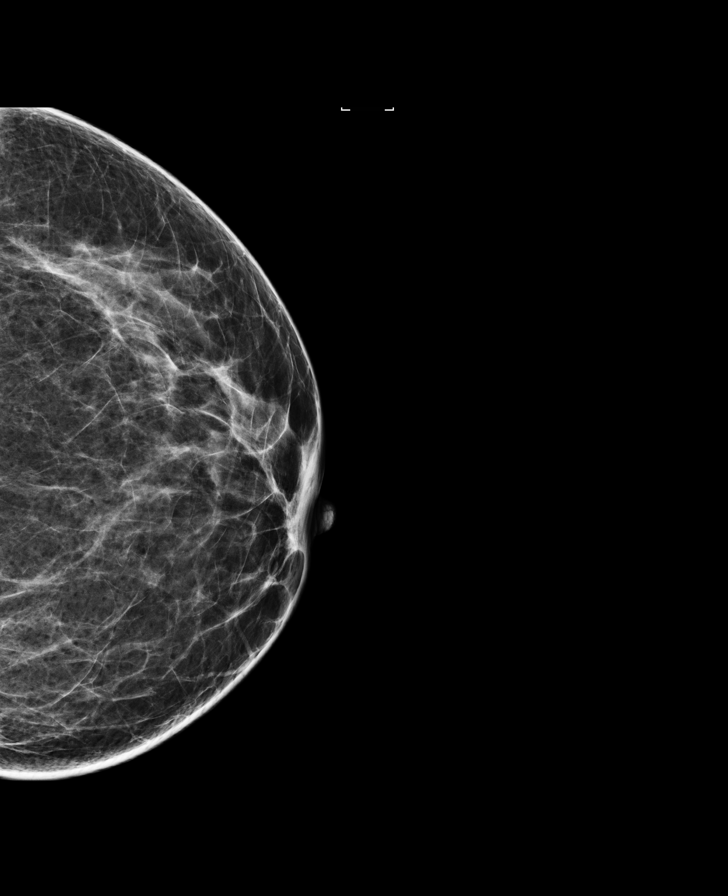

[8 of 28 positions shown; findings below may reference images not displayed]

ACR Breast Density Category b: There are scattered areas of
fibroglandular density.
FINDINGS: There are no findings suspicious for malignancy. Images were
processed with CAD.
IMPRESSION: No mammographic evidence of malignancy. A result letter of this
screening mammogram will be mailed directly to the patient.

RECOMMENDATION:
Screening mammogram in one year. (Code:97-6-RS4)

BI-RADS CATEGORY  1: Negative.

## 2018-01-14 ENCOUNTER — Telehealth: Payer: Self-pay | Admitting: Internal Medicine

## 2018-01-14 NOTE — Telephone Encounter (Signed)
Pt is calling and would like to know if we have 1 lantus pen she can have until the [facility] program finishes her application. jw

## 2018-01-16 MED ORDER — INSULIN GLARGINE 100 UNIT/ML SOLOSTAR PEN: 30.0000 [IU] | PEN_INJECTOR | Freq: Every day | SUBCUTANEOUS | 0 refills | Status: DC

## 2018-01-16 NOTE — Telephone Encounter (Signed)
Called patient regarding request for Lantus sample. Patient has had to reapply for [facility], and it will be 30 days before she can receive Lantus through [facility] again. As such, is requesting two Lantus pens for this time period. Patient is not able to come pick up Lantus immediately, as she is on her way to a funeral, but should be able to pick it up later today or tomorrow.  Two Lantus pens placed in a bag with patient's name on it in med room refrigerator.   Adin Hector, MD, MPH PGY-3 [facility] Pager [phone]

## 2018-01-21 ENCOUNTER — Other Ambulatory Visit (HOSPITAL_COMMUNITY)
Admission: RE | Admit: 2018-01-21 | Discharge: 2018-01-21 | Disposition: A | Discharge status: Home / Self Care | Payer: Self-pay | Source: Ambulatory Visit | Attending: Family Medicine | Admitting: Family Medicine

## 2018-01-21 ENCOUNTER — Other Ambulatory Visit: Payer: Self-pay

## 2018-01-21 ENCOUNTER — Ambulatory Visit (INDEPENDENT_AMBULATORY_CARE_PROVIDER_SITE_OTHER): Payer: Self-pay | Admitting: Internal Medicine

## 2018-01-21 ENCOUNTER — Encounter: Payer: Self-pay | Admitting: Internal Medicine

## 2018-01-21 VITALS — BP 124/78 | HR 82 | Temp 98.5°F | Wt 179.8 lb

## 2018-01-21 DIAGNOSIS — N898 Other specified noninflammatory disorders of vagina: Secondary | ICD-10-CM | POA: Insufficient documentation

## 2018-01-21 LAB — POCT WET PREP (WET MOUNT)
Clue Cells Wet Prep Whiff POC: NEGATIVE
Trichomonas Wet Prep HPF POC: ABSENT

## 2018-01-21 NOTE — Patient Instructions (Addendum)
It was nice seeing you again today [name].  There were no signs of infection on your sample today. If your symptoms do not improve after your next menstrual cycle, please let us know.   If you have any questions or concerns, please feel free to call the clinic.   Be well,  Dr. Avon Gully

## 2018-01-21 NOTE — Progress Notes (Signed)
   Subjective:   Patient: Tracy Weiss       Birthdate: 1966-01-17       MRN: [id number]      HPI  Tracy Weiss is a 52 y.o. female presenting for same day appt for vaginal discharge.   Vaginal discharge Ongoing for 4d. Thin discharge. Says is not white, more yellow. Denies odor. Also with some abdominal pressure and cramping. Denies dysuria, increased urinary frequency. Says that she did use a different type of tampon recently and was unsure if she removed the entire tampon, however she saw the entire intact tampon when she removed it.   Of note, patient speaking on phone during check in process and while vitals were collected. This provider walked into the room after vitals had been collected, and patient remained on phone. I left the room, then returned about 5 minutes later. Patient was still on phone. Patient asked me to wait, and I told her she would need to reschedule if she did not end her phone call. She then hung up the phone, and became agitated, stating that it is unfair for patients to have to wait in the waiting room "for at least 30 minutes" but providers will not wait for patients to end their phone calls. I informed her that her appt was at 4:10PM, she arrived at 4:13PM, was roomed at 4:14PM, and I came into her room for the first time at 4:16PM, and thus was not required to wait in the waiting room at all. Patient said, "Oh...I mean, other times." Patient then detailed why she would be requesting another PCP. I told her I was sorry that she felt that way, but it was certainly her decision to request another PCP if that is what she so desired.   Smoking status reviewed. Patient is former smoker.   Review of Systems See HPI.     Objective:  Physical Exam  Constitutional: She is oriented to person, place, and time. She appears well-developed and well-nourished. No distress.  HENT:  Head: Normocephalic and atraumatic.  Pulmonary/Chest: Effort normal. No respiratory distress.    Genitourinary: Vagina normal and uterus normal. No vaginal discharge found.  Neurological: She is alert and oriented to person, place, and time.  Psychiatric:  Agitated at times       Assessment & Plan:  Vaginal discharge No discharged noted on exam. Wet prep with no abnormalities. GC/chlamydia neg. No reported sx of UTI, so UA not collected at this appt. Continue to monitor. Return if worsening.   Adin Hector, MD, MPH PGY-3 [facility] Pager [phone]

## 2018-01-22 LAB — CERVICOVAGINAL ANCILLARY ONLY
Chlamydia: NEGATIVE
Neisseria Gonorrhea: NEGATIVE

## 2018-01-23 DIAGNOSIS — N898 Other specified noninflammatory disorders of vagina: Secondary | ICD-10-CM | POA: Insufficient documentation

## 2018-01-23 HISTORY — DX: Other specified noninflammatory disorders of vagina: N89.8

## 2018-01-23 NOTE — Assessment & Plan Note (Signed)
No discharged noted on exam. Wet prep with no abnormalities. GC/chlamydia neg. No reported sx of UTI, so UA not collected at this appt. Continue to monitor. Return if worsening.

## 2018-01-30 ENCOUNTER — Other Ambulatory Visit: Payer: Self-pay | Admitting: *Deleted

## 2018-01-30 MED ORDER — INSULIN GLARGINE 100 UNIT/ML SOLOSTAR PEN: 30.0000 [IU] | PEN_INJECTOR | Freq: Every day | SUBCUTANEOUS | 0 refills | Status: DC

## 2018-01-30 NOTE — Telephone Encounter (Signed)
Covering inbox for Dr. Avon Gully  Refill requested for Lantus.  Patient has not had diabetic visit in several months.  Red team please call patient and assist her in scheduling appointment to discuss her diabetes and check her A1c so we can ensure she is on the appropriate amount of medication.  Thank you.  Smitty Cords, MD [place], PGY-3

## 2018-02-20 ENCOUNTER — Telehealth: Payer: Self-pay | Admitting: *Deleted

## 2018-02-20 NOTE — Telephone Encounter (Signed)
Pt has been having chest pain on and off for 3 days.  She does not have SOB with the episodes but does have arm numbness / tingling.  Advised to go to ED or UC to be better evaluated. Pt is agreeable to plan. Fleeger, Salome Spotted, CMA

## 2018-03-26 ENCOUNTER — Ambulatory Visit: Payer: Self-pay

## 2018-04-09 ENCOUNTER — Ambulatory Visit: Payer: Self-pay

## 2018-05-06 ENCOUNTER — Ambulatory Visit: Payer: Self-pay | Admitting: Family Medicine

## 2018-06-20 ENCOUNTER — Other Ambulatory Visit (HOSPITAL_COMMUNITY): Payer: Self-pay | Admitting: *Deleted

## 2018-06-20 DIAGNOSIS — Z1231 Encounter for screening mammogram for malignant neoplasm of breast: Secondary | ICD-10-CM

## 2018-09-03 ENCOUNTER — Inpatient Hospital Stay (HOSPITAL_COMMUNITY)
Admission: AD | Admit: 2018-09-03 | Discharge: 2018-09-03 | Disposition: A | Discharge status: Home / Self Care | Payer: Self-pay | Attending: Family Medicine | Admitting: Family Medicine

## 2018-09-03 ENCOUNTER — Encounter (HOSPITAL_COMMUNITY): Payer: Self-pay

## 2018-09-03 DIAGNOSIS — Z794 Long term (current) use of insulin: Secondary | ICD-10-CM | POA: Insufficient documentation

## 2018-09-03 DIAGNOSIS — R197 Diarrhea, unspecified: Secondary | ICD-10-CM | POA: Insufficient documentation

## 2018-09-03 DIAGNOSIS — N939 Abnormal uterine and vaginal bleeding, unspecified: Secondary | ICD-10-CM | POA: Insufficient documentation

## 2018-09-03 DIAGNOSIS — Z87891 Personal history of nicotine dependence: Secondary | ICD-10-CM | POA: Insufficient documentation

## 2018-09-03 DIAGNOSIS — E1165 Type 2 diabetes mellitus with hyperglycemia: Secondary | ICD-10-CM | POA: Insufficient documentation

## 2018-09-03 DIAGNOSIS — R195 Other fecal abnormalities: Secondary | ICD-10-CM

## 2018-09-03 LAB — POCT PREGNANCY, URINE: Preg Test, Ur: NEGATIVE

## 2018-09-03 LAB — URINALYSIS, ROUTINE W REFLEX MICROSCOPIC
Bacteria, UA: NONE SEEN
Bilirubin Urine: NEGATIVE
Glucose, UA: >=500 mg/dL — AB
Ketones, ur: NEGATIVE mg/dL
Leukocytes, UA: NEGATIVE
Nitrite: NEGATIVE
Protein, ur: NEGATIVE mg/dL
Specific Gravity, Urine: 1.028 (ref 1.005–1.030)
pH: 6 (ref 5.0–8.0)

## 2018-09-03 LAB — GLUCOSE, CAPILLARY: Glucose-Capillary: 310 mg/dL — ABNORMAL HIGH (ref 70–99)

## 2018-09-03 NOTE — MAU Note (Signed)
Pt here with c/o vaginal bleeding and cramping. Bleeding has been off and on since 08/29/18. LMP that was normal was 06/26/18

## 2018-09-03 NOTE — MAU Provider Note (Signed)
Chief Complaint: Vaginal Bleeding and Abdominal Pain   First Provider Initiated Contact with Patient 09/03/18 0540     SUBJECTIVE HPI: Tracy Weiss is a 53 y.o. G1P0 female who presents to Maternity Admissions reporting being late for her period and then starting to have irregular spurts of bleeding since 08/29/18. Was cramping, but that stopped a few days ago. States she has always had regular periods before this. Patient's last menstrual period was 06/26/2018 (approximate). has not seen PCP or Gyn for any of these problems.   Associated signs and symptoms: Neg for fever, chills, vaginal discharge, dizziness, N/V/C. Pos for abdominal distention, increased, looser stools and unexplained weight gain.   Past Medical History:  Diagnosis Date  . Diabetes mellitus without complication ([place])   . Gallstones 09/2016   OB History  Gravida Para Term Preterm AB Living  1         2  SAB TAB Ectopic Multiple Live Births        1      # Outcome Date GA Lbr Len/2nd Weight Sex Delivery Anes PTL Lv  1 Gravida      CS-LTranv      Past Surgical History:  Procedure Laterality Date  . CESAREAN SECTION    . CHOLECYSTECTOMY N/A 09/11/2016   Procedure: LAPAROSCOPIC CHOLECYSTECTOMY WITH INTRAOPERATIVE CHOLANGIOGRAM;  Surgeon: Donnie Mesa, MD;  Location: [facility];  Service: General;  Laterality: N/A;  . tubal ligaton reversal  2017   Social History   Socioeconomic History  . Marital status: Single    Spouse name: Not on file  . Number of children: Not on file  . Years of education: Not on file  . Highest education level: Not on file  Occupational History  . Not on file  Social Needs  . Financial resource strain: Not on file  . Food insecurity:    Worry: Not on file    Inability: Not on file  . Transportation needs:    Medical: Not on file    Non-medical: Not on file  Tobacco Use  . Smoking status: Former Smoker    Packs/day: 0.50    Years: 10.00    Pack years: 5.00    Types: Cigarettes    Start  date: 09/03/1982    Last attempt to quit: 09/04/1983    Years since quitting: 35.0  . Smokeless tobacco: Never Used  . Tobacco comment: ' Patient claims she has never smoked "  Substance and Sexual Activity  . Alcohol use: No    Alcohol/week: 0.0 standard drinks  . Drug use: No  . Sexual activity: Yes    Birth control/protection: None  Lifestyle  . Physical activity:    Days per week: Not on file    Minutes per session: Not on file  . Stress: Not on file  Relationships  . Social connections:    Talks on phone: Not on file    Gets together: Not on file    Attends religious service: Not on file    Active member of club or organization: Not on file    Attends meetings of clubs or organizations: Not on file    Relationship status: Not on file  . Intimate partner violence:    Fear of current or ex partner: Not on file    Emotionally abused: Not on file    Physically abused: Not on file    Forced sexual activity: Not on file  Other Topics Concern  . Not on file  Social  History Narrative  . Not on file   Family History  Problem Relation Age of Onset  . Cancer Mother        Metastatic with Unknown Primary; Stomach was involved  . Diabetes Father   . Heart disease Father   . Breast cancer Neg Hx    No current facility-administered medications on file prior to encounter.    Current Outpatient Medications on File Prior to Encounter  Medication Sig Dispense Refill  . insulin aspart (NOVOLOG FLEXPEN) 100 UNIT/ML FlexPen Inject 10 Units into the skin 3 (three) times daily with meals. 2 pen 0  . insulin aspart (NOVOLOG) 100 UNIT/ML FlexPen Inject 10 Units into the skin 3 (three) times daily with meals. Giving correction bolus of 3-5 units for fasting blood glucose >150    . insulin aspart (NOVOLOG) 100 UNIT/ML injection Inject 10 Units into the skin 3 (three) times daily before meals. 1 vial 0  . Insulin Glargine (LANTUS) 100 UNIT/ML Solostar Pen Inject 30 Units into the skin daily at  10 pm. 6 mL 0  . ALPRAZolam (XANAX) 0.25 MG tablet Take 1 tablet (0.25 mg total) by mouth 2 (two) times daily as needed for anxiety. (Patient not taking: Reported on 09/05/2017) 30 tablet 0  . atorvastatin (LIPITOR) 10 MG tablet Take 1 tablet (10 mg total) by mouth daily. 90 tablet 3  . cycloSPORINE (RESTASIS) 0.05 % ophthalmic emulsion Place 1 drop into both eyes 2 (two) times daily as needed (dry eyes).    . Dulaglutide (TRULICITY) 1.5 HL/4.5GY SOPN Inject 1.5 mg into the skin once a week. 4 pen 11  . FLUoxetine (PROZAC) 20 MG capsule Take 1 capsule (20 mg total) by mouth daily. 90 capsule 3  . folic acid (FOLVITE) 1 MG tablet Take 1 mg by mouth daily.    . metFORMIN (GLUCOPHAGE XR) 500 MG 24 hr tablet Take 2 tablets (1,000 mg total) by mouth daily with breakfast. 180 tablet 3  . methylphenidate (RITALIN LA) 10 MG 24 hr capsule Take 1 capsule (10 mg total) by mouth daily with lunch. 30 capsule 0  . methylphenidate (RITALIN LA) 30 MG 24 hr capsule Take 1 capsule (30 mg total) by mouth every morning. Fill 30 days after prior refill. 30 capsule 0  . methylphenidate (RITALIN LA) 30 MG 24 hr capsule Take 1 capsule (30 mg total) by mouth every morning. Fill 30 days after prior refill. 30 capsule 0  . methylphenidate (RITALIN) 10 MG tablet Take 1 tablet (10 mg total) by mouth daily with lunch. 90 tablet 0  . olmesartan-hydrochlorothiazide (BENICAR HCT) 40-25 MG tablet Take 1 tablet by mouth daily. 90 tablet 3  . Prenatal Vit-Fe Fumarate-FA (MULTIVITAMIN-PRENATAL) 27-0.8 MG TABS tablet Take 1 tablet by mouth daily at 12 noon.    . Semaglutide (OZEMPIC) 0.25 or 0.5 MG/DOSE SOPN Inject 0.25 mg into the skin once a week. 3 pen 4   No Known Allergies  I have reviewed patient's Past Medical Hx, Surgical Hx, Family Hx, Social Hx, medications and allergies.   Review of Systems  Constitutional: Positive for unexpected weight change (weight gain). Negative for appetite change, chills and fever.   Gastrointestinal: Positive for abdominal pain (resolved spontaneously) and diarrhea (looser stools than normal). Negative for blood in stool, constipation, nausea and vomiting.  Genitourinary: Positive for vaginal bleeding. Negative for dysuria and vaginal discharge.  Neurological: Negative for dizziness.    OBJECTIVE Patient Vitals for the past 24 hrs:  BP Temp Temp src Pulse Resp  SpO2 Height Weight  09/03/18 0402 123/76 97.7 F (36.5 C) Oral 76 18 100 % 5' 1.5" (1.562 m) 87.5 kg   Constitutional: Well-developed, well-nourished female in no acute distress.  Skin: No pallor Cardiovascular: normal rate Respiratory: normal rate and effort.  GI: Abd soft, non-distended, non-tender. Pos BS x 4. MS: Extremities nontender, no edema, normal ROM Neurologic: Alert and oriented x 4.  GU:  SPECULUM EXAM: NEFG, physiologic discharge, scant tan blood noted, cervix clean, no polyps  BIMANUAL: cervix closed; uterus normal size, no adnexal tenderness or masses. No CMT.  LAB RESULTS Results for orders placed or performed during the hospital encounter of 09/03/18 (from the past 24 hour(s))  Urinalysis, Routine w reflex microscopic     Status: Abnormal   Collection Time: 09/03/18  4:13 AM  Result Value Ref Range   Color, Urine STRAW (A) YELLOW   APPearance CLEAR CLEAR   Specific Gravity, Urine 1.028 1.005 - 1.030   pH 6.0 5.0 - 8.0   Glucose, UA >=500 (A) NEGATIVE mg/dL   Hgb urine dipstick MODERATE (A) NEGATIVE   Bilirubin Urine NEGATIVE NEGATIVE   Ketones, ur NEGATIVE NEGATIVE mg/dL   Protein, ur NEGATIVE NEGATIVE mg/dL   Nitrite NEGATIVE NEGATIVE   Leukocytes, UA NEGATIVE NEGATIVE   RBC / HPF 0-5 0 - 5 RBC/hpf   WBC, UA 0-5 0 - 5 WBC/hpf   Bacteria, UA NONE SEEN NONE SEEN   Squamous Epithelial / LPF 0-5 0 - 5   Mucus PRESENT   Pregnancy, urine POC     Status: None   Collection Time: 09/03/18  4:17 AM  Result Value Ref Range   Preg Test, Ur NEGATIVE NEGATIVE  Glucose, capillary      Status: Abnormal   Collection Time: 09/03/18  6:10 AM  Result Value Ref Range   Glucose-Capillary 310 (H) 70 - 99 mg/dL   CBG 310  IMAGING NA  MAU COURSE Orders Placed This Encounter  Procedures  . Urinalysis, Routine w reflex microscopic  . Glucose, capillary  . Pregnancy, urine POC  . Discharge patient   Declined CBC, Wet prep, GC/Chlamydia cultures.  MDM - AUB of unknown etiology. No active bleeding. Need to see Gyn for menstrual irregularities. Likely due to perimenopause, but may need EBX to eval for malignancy. List of Gyns given.  - Hyperglycemia. Had irreg eating schedule yesterday and didn't take Novolog when she didn't eat. Recommended eating and taking insulin on schedule.  - Subjective Abd distension and loose stools, weight gain. Recommend discussing w/ PCP.   ASSESSMENT 1. Abnormal uterine bleeding (AUB)   2. Loose stools   3. Type 2 diabetes mellitus with hyperglycemia, with long-term current use of insulin ([place])     PLAN Discharge home in stable condition. Bleeding precautions Follow-up Information    Gynecologist of your choice Follow up.   Why:  for menstrual irregularities       Caroline More, DO Follow up.   Specialty:  Family Medicine Why:  for changes in weight and bowel habits. Contact information: [address] [address] [phone]          Allergies as of 09/03/2018   No Known Allergies     Medication List    TAKE these medications   ALPRAZolam 0.25 MG tablet Commonly known as:  XANAX Take 1 tablet (0.25 mg total) by mouth 2 (two) times daily as needed for anxiety.   atorvastatin 10 MG tablet Commonly known as:  LIPITOR Take 1  tablet (10 mg total) by mouth daily.   cycloSPORINE 0.05 % ophthalmic emulsion Commonly known as:  RESTASIS Place 1 drop into both eyes 2 (two) times daily as needed (dry eyes).   Dulaglutide 1.5 MG/0.5ML Sopn Commonly known as:  TRULICITY Inject 1.5 mg into the skin once a  week.   FLUoxetine 20 MG capsule Commonly known as:  PROZAC Take 1 capsule (20 mg total) by mouth daily.   folic acid 1 MG tablet Commonly known as:  FOLVITE Take 1 mg by mouth daily.   insulin aspart 100 UNIT/ML FlexPen Commonly known as:  NOVOLOG Inject 10 Units into the skin 3 (three) times daily with meals. Giving correction bolus of 3-5 units for fasting blood glucose >150   insulin aspart 100 UNIT/ML injection Commonly known as:  novoLOG Inject 10 Units into the skin 3 (three) times daily before meals.   insulin aspart 100 UNIT/ML FlexPen Commonly known as:  NOVOLOG FLEXPEN Inject 10 Units into the skin 3 (three) times daily with meals.   Insulin Glargine 100 UNIT/ML Solostar Pen Commonly known as:  LANTUS Inject 30 Units into the skin daily at 10 pm.   metFORMIN 500 MG 24 hr tablet Commonly known as:  GLUCOPHAGE XR Take 2 tablets (1,000 mg total) by mouth daily with breakfast.   methylphenidate 30 MG 24 hr capsule Commonly known as:  RITALIN LA Take 1 capsule (30 mg total) by mouth every morning. Fill 30 days after prior refill.   methylphenidate 10 MG tablet Commonly known as:  RITALIN Take 1 tablet (10 mg total) by mouth daily with lunch.   methylphenidate 30 MG 24 hr capsule Commonly known as:  RITALIN LA Take 1 capsule (30 mg total) by mouth every morning. Fill 30 days after prior refill.   methylphenidate 10 MG 24 hr capsule Commonly known as:  RITALIN LA Take 1 capsule (10 mg total) by mouth daily with lunch.   multivitamin-prenatal 27-0.8 MG Tabs tablet Take 1 tablet by mouth daily at 12 noon.   olmesartan-hydrochlorothiazide 40-25 MG tablet Commonly known as:  BENICAR HCT Take 1 tablet by mouth daily.   Semaglutide(0.25 or 0.5MG /DOS) 2 MG/1.5ML Sopn Commonly known as:  OZEMPIC (0.25 OR 0.5 MG/DOSE) Inject 0.25 mg into the skin once a week.       Tamala Julian, [place], [place] 09/03/2018  6:24 AM

## 2018-09-03 NOTE — Discharge Instructions (Signed)
[place] for [employer] at [facility]       Phone: [phone]  [facility] [employer] [facility] [employer] Phone: [place] for [employer] at [place]  Phone: [place] for [employer] at [employer]  Phone: [place] for [facility] at [place]  Phone: [facility]       Phone: [phone]  [facility]    Phone: [phone]   [facility])    Phone: [facility]    Phone: [phone]  [facility]    Phone: [phone]  [place]    Phone: [phone]  [place] [facility]       Phone: [phone]   [place] [facility]  Phone: [phone]  [employer]    Phone: [phone]  [facility]   Phone: [phone]  [facility]      Phone: [phone]  [facility]    Phone: [phone]    Abnormal Uterine Bleeding Abnormal uterine bleeding is unusual bleeding from the uterus. It includes:  Bleeding or spotting between periods.  Bleeding after sex.  Bleeding that is heavier than normal.  Periods that last longer than usual.  Bleeding after menopause. Abnormal uterine bleeding can affect women at various stages in life, including teenagers, women in their reproductive years, pregnant women, and women who have reached menopause. Common causes of abnormal uterine bleeding include:  Pregnancy.  Growths of tissue (polyps).  A noncancerous tumor in the uterus (fibroid).  Infection.  Cancer.  Hormonal imbalances. Any type of abnormal bleeding should be evaluated by a health care provider. Many cases are minor and simple to treat, while others are more serious. Treatment will  depend on the cause of the bleeding. Follow these instructions at home:  Monitor your condition for any changes.  Do not use tampons, douche, or have sex if told by your health care provider.  Change your pads often.  Get regular exams that include pelvic exams and cervical cancer screening.  Keep all follow-up visits as told by your health care provider. This is important. Contact a health care provider if:  Your bleeding lasts for more than one week.  You feel dizzy at times.  You feel nauseous or you vomit. Get help right away if:  You pass out.  Your bleeding soaks through a pad every hour.  You have abdominal pain.  You have a fever.  You become sweaty or weak.  You pass large blood clots from your vagina. Summary  Abnormal uterine bleeding is unusual bleeding from the uterus.  Any type of abnormal bleeding should be evaluated by a health care provider. Many cases are minor and simple to treat, while others are more serious.  Treatment will depend on the cause of the bleeding. This information is not intended to replace advice given to you by your health care provider. Make sure you discuss any questions you have with your health care provider. Document Released: 08/20/2005 Document Revised: 09/21/2016 Document Reviewed: 09/21/2016 Elsevier Interactive Patient Education  2019 [employer].

## 2018-09-04 ENCOUNTER — Ambulatory Visit (HOSPITAL_COMMUNITY)
Admission: RE | Admit: 2018-09-04 | Discharge: 2018-09-04 | Disposition: A | Discharge status: Home / Self Care | Payer: Self-pay | Source: Ambulatory Visit | Attending: Obstetrics and Gynecology | Admitting: Obstetrics and Gynecology

## 2018-09-04 ENCOUNTER — Encounter (HOSPITAL_COMMUNITY): Payer: Self-pay

## 2018-09-04 ENCOUNTER — Ambulatory Visit
Admission: RE | Admit: 2018-09-04 | Discharge: 2018-09-04 | Disposition: A | Discharge status: Home / Self Care | Payer: No Typology Code available for payment source | Source: Ambulatory Visit | Attending: Obstetrics and Gynecology | Admitting: Obstetrics and Gynecology

## 2018-09-04 VITALS — BP 120/74 | Ht 61.0 in | Wt 191.0 lb

## 2018-09-04 DIAGNOSIS — Z1231 Encounter for screening mammogram for malignant neoplasm of breast: Secondary | ICD-10-CM

## 2018-09-04 DIAGNOSIS — Z1239 Encounter for other screening for malignant neoplasm of breast: Secondary | ICD-10-CM

## 2018-09-04 NOTE — Progress Notes (Signed)
No complaints today.   Pap Smear: Pap smear not completed today. Last Pap smear was 08/23/2014 at [facility] and normal with negative HPV. Per patient has no history of an abnormal Pap smear. Last Pap smear result is in Epic.  Physical exam: Breasts Breasts symmetrical. No skin abnormalities bilateral breasts. No nipple retraction bilateral breasts. No nipple discharge bilateral breasts. No lymphadenopathy. No lumps palpated bilateral breasts. No complaints of pain or tenderness on exam. Referred patient to the [facility] for a screening mammogram. Appointment scheduled for Thursday, September 04, 2018 at 1230.        Pelvic/Bimanual No Pap smear completed today since last Pap smear and HPV typing was 08/23/2014. Pap smear not indicated per BCCCP guidelines.   Smoking History: Patient has never smoked.  Patient Navigation: Patient education provided. Access to services provided for patient through [facility] program.   Colorectal Cancer Screening: Per patient has never had a colonoscopy completed. No complaints today. FIT Test given to patient to complete and return to B[facility]CP.  Breast and Cervical Cancer Risk Assessment: Patient has a family history of her mother having breast cancer. Patient has no known genetic mutations or history of radiation treatment to the chest before age 11. Patient has no history of cervical dysplasia, immunocompromised, or DES exposure in-utero.  Risk Assessment    Risk Scores      09/04/2018   Last edited by: Loletta Parish, RN   5-year risk: 1.8 %   Lifetime risk: 13.5 %

## 2018-09-04 NOTE — Patient Instructions (Signed)
Explained breast self awareness with Tracy Weiss. Patient did not need a Pap smear today due to last Pap smear and HPV typing was 08/23/2014. Let her know BCCCP will cover Pap smears and HPV typing every 5 years unless has a history of abnormal Pap smears. Referred patient to the [facility] for a screening mammogram. Appointment scheduled for Thursday, September 04, 2018 at 1230. Patient aware of appointment and will be there. Let patient know the [facility] will follow up with her within the next couple weeks with results of mammogram by letter or phone. Tracy Weiss verbalized understanding.  Tracy Weiss, [place]vil [place], RN 11:44 AM

## 2018-09-05 ENCOUNTER — Encounter (HOSPITAL_COMMUNITY): Payer: Self-pay | Admitting: *Deleted

## 2018-09-10 ENCOUNTER — Other Ambulatory Visit: Payer: Self-pay

## 2018-09-21 LAB — FECAL OCCULT BLOOD, IMMUNOCHEMICAL: Fecal Occult Bld: NEGATIVE

## 2018-09-21 NOTE — Progress Notes (Signed)
Subjective:    Patient ID: Tracy Weiss, female    DOB: 1965-11-24, 53 y.o.   MRN: [id number]   CC: Diabetes and AUB  HPI: Diabetes Fasting checks: 170s Post prandial did not check  Compliance: non compliant.  Patient has been out of her medications for some time now.  Is getting her medications from her aunt.  It is unclear which medication she is taking. Diet: Decreasing carbs Exercise: Walking daily Eye exam: Needs eye exam Foot exam: Due A1C: 9.9, 9.3 on 05/2017 Symptoms: no symptoms of hypoglycemia. no symptoms of  polyuria, polydipsia. no numbness in extremities, and no foot ulcers/trauma Meds: lantus 30U, novolog 10U tid + fasting correction bolus, ozempic 0.25mg /weekly   Abnormal Uterine Bleeding Patient coming in with concern of abnormal uterine bleeding.  States that she has been bleeding daily since December 23.  States that at first she noticed clots and then began to fill up the toilet with blood.  Has pictures showing soaked pads as well as toilet full of blood.  Patient states that she thought she had a miscarriage because of how much blood there was.  States that she has had test in the past and was told she has no fibroids.  Even saw an infertility doctor in 2018 who told her she had no issues and was not going through menopause at that time.  States that bleeding has become so bad that she leaks through her clothes.  Has to change pads every 30 minutes or go to the bathroom due to excessive bleeding.  States that this past Tuesday the bleeding stopped being so severe.  States that now she only notices blood when she wipes on the toilet.  Tuesday has only had to use 1 pad a day.  Along with bleeding patient had shortness of breath which is now resolved.  Denies any dizziness or lightheadedness.  Denies any chest pain or palpitations.  Does report some back pain.  Menstrual history includes regular monthly periods.  States that she did miss her period in November and in December  she was late.  Mother went through menopause in her late 86s, she thinks after 50 years old.  Recently seen and [facility] in the beginning of January, 1/1, for this problem.  At that time urine pregnancy test was negative.  No further work-up done.  Last Pap smear done at [facility] in 2015 which was negative and HPV was negative.  Objective:  BP 120/80   Pulse 86   Temp 98.4 F (36.9 C) (Oral)   Wt 191 lb (86.6 kg)   LMP 08/29/2018 (Approximate)   SpO2 98%   BMI 36.09 kg/m  Vitals and nursing note reviewed  General: well nourished, in no acute distress HEENT: normocephalic, no scleral icterus or conjunctival pallor, no nasal discharge, moist mucous membranes Neck: supple, non-tender, without lymphadenopathy Cardiac: RRR, clear S1 and S2, no murmurs, rubs, or gallops Respiratory: clear to auscultation bilaterally, no increased work of breathing Abdomen: soft, nontender, nondistended, no masses or organomegaly. Bowel sounds present Extremities: no edema or cyanosis. Warm, well perfused.   Skin: warm and dry, no rashes noted Neuro: alert and oriented, no focal deficits Female genitalia: not done normal external genitalia, vulva, vagina, cervix, uterus and adnexa. Some bleeding around cervical os   Assessment & Plan:    Type 2 diabetes mellitus without complication, with long-term current use of insulin ([place]) History of longstanding uncontrolled diabetes.  Patient is noncompliant on medications that she has been out.  Has been using her aunts medications.  Last note from Dr. Valentina Lucks on 09/06/2017 stating that patient should be on Lantus 30 units daily, NovoLog 10 units 3 times daily plus fasting correction bolus, and Ozempic 0.25 mg once weekly.  Patient states that she has been out of these medications and is self-pay so she has difficulty affording medications.  Discussed with patient the option of going to [facility] to use the over-the-counter versions.  Have also sent prescription to [facility] and she  may talk to pharmacist at [facility] to see which is cheaper.  Stressed the importance of seeking financial assistance for long-term management.  Instructed patient to make an appointment with Kennyth Lose to discuss financial assistance and possibly going to the map program.  Patient states that she will make this appointment on her way out.  Patient agreed to go to [facility] to see which option is cheaper.  After patient had left office she went up to front desk and stated that she would like a sample.  1 pen of NovoLog as well as 1 pen of Lantus provided to patient.  Stressed to patient that this is a one-time sample and that she cannot rely on samples alone to manage her diabetes.  Patient will need long-term financial assistance to help pay for her medications so that her glucose can remain under control.  Hopefully patient is able to obtain financial assistance or work with map program.  Advised patient to follow-up in 1 month to ensure adequate glycemic control now that she is on her proper insulin regimen.  Unable to provide Ozempic sample and price is over $800 per good Rx. -continue lantus 30U daily, sample provided  -continue novolog 10U TID + fasting correction bolus, sample provided  -unable to afford ozempic, hopefully with financial assistance she can afford this  -patient to contact Kennyth Lose to seek financial assistance -follow up in 1 month  -referral to ophthalmology placed -foot exam at 1 month follow up -  Abnormal uterine bleeding Patient with abnormal uterine bleeding x1 month.  Unclear etiology at this time.  Appears to be resolving on its own without medical management. Trans-vaginal ultrasound from 04/2016 showing small uterine leiomyoma.  Possibly increase in size which is causing new abnormal uterine bleeding.  Can also consider start of menopause as patient is now over 87 years old.  Unlikely miscarriage as patient had negative urine pregnancy at [facility].  Refusing urine pregnancy today due to  cost.  Will obtain CBC to ensure no anemia especially given that patient was short of breath during bleeding episodes.  Phone number to reach patient over the weekend if there is concern over severe anemia that requires transfusion or hospitalization is [phone].  Will call over the weekend if anemia is severe.  Recommended ultrasound to rule out any anatomic causes.  Order was placed and as CMA was placing phone call to schedule this patient states that she would not like to schedule at this time due to cost.  Discussed risks of not obtaining ultrasound, however, patient states that she would not like to have ultrasound at this time as she cannot afford it.  Advised patient to follow-up in 1 month to ensure bleeding is resolved.  Will not need medication such as Megace as this time as bleeding seems to have resolved on its own.  Bimanual and speculum exam within normal limits.  Does not need Pap smear as she is up-to-date, due at the end of 2020. -CBC -Recommended ultrasound however  patient refused -Refusing other labs at this time -We will defer endometrial biopsy at this time given financial concern, may need if no improvement -Follow-up in 1 month.  Strict return precautions given -patient requesting referral to GYN, referral placed  Discussed patient with Dr. Owens Shark    Return in about 1 month (around 10/27/2018).   Caroline More, DO, PGY-2

## 2018-09-26 ENCOUNTER — Ambulatory Visit (INDEPENDENT_AMBULATORY_CARE_PROVIDER_SITE_OTHER): Payer: Self-pay | Admitting: Family Medicine

## 2018-09-26 ENCOUNTER — Encounter: Payer: Self-pay | Admitting: Family Medicine

## 2018-09-26 VITALS — BP 120/80 | HR 86 | Temp 98.4°F | Wt 191.0 lb

## 2018-09-26 DIAGNOSIS — Z23 Encounter for immunization: Secondary | ICD-10-CM

## 2018-09-26 DIAGNOSIS — E119 Type 2 diabetes mellitus without complications: Secondary | ICD-10-CM

## 2018-09-26 DIAGNOSIS — N939 Abnormal uterine and vaginal bleeding, unspecified: Secondary | ICD-10-CM | POA: Insufficient documentation

## 2018-09-26 DIAGNOSIS — Z794 Long term (current) use of insulin: Secondary | ICD-10-CM

## 2018-09-26 HISTORY — DX: Abnormal uterine and vaginal bleeding, unspecified: N93.9

## 2018-09-26 LAB — POCT GLYCOSYLATED HEMOGLOBIN (HGB A1C): HbA1c, POC (controlled diabetic range): 9.9 % — AB (ref 0.0–7.0)

## 2018-09-26 MED ORDER — INSULIN GLARGINE 100 UNIT/ML SOLOSTAR PEN: 30.0000 [IU] | PEN_INJECTOR | Freq: Every day | SUBCUTANEOUS | 0 refills | Status: DC

## 2018-09-26 MED ORDER — INSULIN ASPART 100 UNIT/ML FLEXPEN: 10.0000 [IU] | PEN_INJECTOR | Freq: Three times a day (TID) | SUBCUTANEOUS | 0 refills | Status: DC

## 2018-09-26 NOTE — Assessment & Plan Note (Signed)
History of longstanding uncontrolled diabetes.  Patient is noncompliant on medications that she has been out.  Has been using her aunts medications.  Last note from Dr. Valentina Lucks on 09/06/2017 stating that patient should be on Lantus 30 units daily, NovoLog 10 units 3 times daily plus fasting correction bolus, and Ozempic 0.25 mg once weekly.  Patient states that she has been out of these medications and is self-pay so she has difficulty affording medications.  Discussed with patient the option of going to [facility] to use the over-the-counter versions.  Have also sent prescription to [facility] and she may talk to pharmacist at [facility] to see which is cheaper.  Stressed the importance of seeking financial assistance for long-term management.  Instructed patient to make an appointment with Kennyth Lose to discuss financial assistance and possibly going to the map program.  Patient states that she will make this appointment on her way out.  Patient agreed to go to [facility] to see which option is cheaper.  After patient had left office she went up to front desk and stated that she would like a sample.  1 pen of NovoLog as well as 1 pen of Lantus provided to patient.  Stressed to patient that this is a one-time sample and that she cannot rely on samples alone to manage her diabetes.  Patient will need long-term financial assistance to help pay for her medications so that her glucose can remain under control.  Hopefully patient is able to obtain financial assistance or work with map program.  Advised patient to follow-up in 1 month to ensure adequate glycemic control now that she is on her proper insulin regimen.  Unable to provide Ozempic sample and price is over $800 per good Rx. -continue lantus 30U daily, sample provided  -continue novolog 10U TID + fasting correction bolus, sample provided  -unable to afford ozempic, hopefully with financial assistance she can afford this  -patient to contact Kennyth Lose to seek financial  assistance -follow up in 1 month  -referral to ophthalmology placed -foot exam at 1 month follow up -

## 2018-09-26 NOTE — Patient Instructions (Signed)
It was a pleasure seeing you today.   Today we discussed your bleeding and diabetes  For your bleeding: I have scheduled an [name] and gotten labs. I will call if they are abnormal  For your diabetes: I [facility]. Check what is cheaper over the counter vs. Prescription. Make an appointment Kennyth Lose in financial assistance.   Please follow up in 1 month or sooner if symptoms persist or worsen. Please call the clinic immediately if you have any concerns.   Our clinic's number is [phone]. Please call with questions or concerns.   Please go to the emergency room if you have shortness of breath, chest pain, or increased bleeding  Thank you,  Caroline More, DO

## 2018-09-26 NOTE — Assessment & Plan Note (Addendum)
Patient with abnormal uterine bleeding x1 month.  Unclear etiology at this time.  Appears to be resolving on its own without medical management. Trans-vaginal ultrasound from 04/2016 showing small uterine leiomyoma.  Possibly increase in size which is causing new abnormal uterine bleeding.  Can also consider start of menopause as patient is now over 53 years old.  Unlikely miscarriage as patient had negative urine pregnancy at [facility].  Refusing urine pregnancy today due to cost.  Will obtain CBC to ensure no anemia especially given that patient was short of breath during bleeding episodes.  Phone number to reach patient over the weekend if there is concern over severe anemia that requires transfusion or hospitalization is [phone].  Will call over the weekend if anemia is severe.  Recommended ultrasound to rule out any anatomic causes.  Order was placed and as CMA was placing phone call to schedule this patient states that she would not like to schedule at this time due to cost.  Discussed risks of not obtaining ultrasound, however, patient states that she would not like to have ultrasound at this time as she cannot afford it.  Advised patient to follow-up in 1 month to ensure bleeding is resolved.  Will not need medication such as Megace as this time as bleeding seems to have resolved on its own.  Bimanual and speculum exam within normal limits.  Does not need Pap smear as she is up-to-date, due at the end of 2020. -CBC -Recommended ultrasound however patient refused -Refusing other labs at this time -We will defer endometrial biopsy at this time given financial concern, may need if no improvement -Follow-up in 1 month.  Strict return precautions given -patient requesting referral to GYN, referral placed  Discussed patient with Dr. Owens Shark

## 2018-09-27 LAB — CBC
Hematocrit: 36.8 % (ref 34.0–46.6)
Hemoglobin: 12.2 g/dL (ref 11.1–15.9)
MCH: 28.7 pg (ref 26.6–33.0)
MCHC: 33.2 g/dL (ref 31.5–35.7)
MCV: 87 fL (ref 79–97)
Platelets: 349 10*3/uL (ref 150–450)
RBC: 4.25 x10E6/uL (ref 3.77–5.28)
RDW: 12.8 % (ref 11.7–15.4)
WBC: 6.3 10*3/uL (ref 3.4–10.8)

## 2018-09-29 ENCOUNTER — Telehealth: Payer: Self-pay

## 2018-09-29 NOTE — Telephone Encounter (Signed)
Pt called nurse line stating she is still having abnormal uterine bleeding and would like to proceed with the imaging. I informed her this has already been ordered and will call to schedule and let her know when and where. Pt then stated she wanted to speak with Darrick Penna, "Darrick Penna knows where to have my imaging done a cheaper price."  Will forward to [place], as I am unsure what she is referring to.

## 2018-09-30 ENCOUNTER — Telehealth: Payer: Self-pay

## 2018-09-30 ENCOUNTER — Telehealth: Payer: Self-pay | Admitting: *Deleted

## 2018-09-30 NOTE — Telephone Encounter (Signed)
Called and informed patient of Ultrasound appointment at [facility].  Patient is to arrive with a full bladder after drinking 32 ounces.  No voiding prior to appointment.  Tracy Weiss, [place]

## 2018-09-30 NOTE — Telephone Encounter (Signed)
Pt informed of normal results. Myran Arcia Kennon Holter, CMA

## 2018-09-30 NOTE — Telephone Encounter (Signed)
-----   Message from Caroline More, DO sent at 09/27/2018  8:46 AM EST ----- Please inform patient that results are negative. Hgb is actually improved from last year of 11.8

## 2018-10-01 ENCOUNTER — Encounter (HOSPITAL_COMMUNITY): Payer: Self-pay

## 2018-10-02 ENCOUNTER — Ambulatory Visit (HOSPITAL_COMMUNITY)
Admission: RE | Admit: 2018-10-02 | Discharge: 2018-10-02 | Disposition: A | Discharge status: Home / Self Care | Payer: No Typology Code available for payment source | Source: Ambulatory Visit | Attending: Family Medicine | Admitting: Family Medicine

## 2018-10-02 DIAGNOSIS — N939 Abnormal uterine and vaginal bleeding, unspecified: Secondary | ICD-10-CM | POA: Insufficient documentation

## 2018-10-03 ENCOUNTER — Telehealth: Payer: Self-pay

## 2018-10-03 NOTE — Telephone Encounter (Signed)
Spoke to patient. Will need to be seen by GYN. Referral placed by me at previous appointment. Patient wished to talk to [facility] to referrals. Informed patient to call clinic if she desired to talk to [facility]. Offered phone number for [facility] but patient refused.   Dalphine Handing, PGY-2 [facility] 10/03/2018 12:50 PM

## 2018-10-03 NOTE — Telephone Encounter (Signed)
Patient left message asking about [name] results. States still having heavy bleeding.  Call back is [phone].  Danley Danker, RN [facility] RN)

## 2018-10-07 ENCOUNTER — Encounter: Payer: Self-pay | Admitting: Family Medicine

## 2018-10-07 ENCOUNTER — Other Ambulatory Visit (HOSPITAL_COMMUNITY)
Admission: RE | Admit: 2018-10-07 | Discharge: 2018-10-07 | Disposition: A | Discharge status: Home / Self Care | Payer: No Typology Code available for payment source | Source: Ambulatory Visit | Attending: Family Medicine | Admitting: Family Medicine

## 2018-10-07 ENCOUNTER — Ambulatory Visit (INDEPENDENT_AMBULATORY_CARE_PROVIDER_SITE_OTHER): Payer: Self-pay | Admitting: Family Medicine

## 2018-10-07 VITALS — BP 130/84 | HR 84 | Ht 61.5 in | Wt 190.2 lb

## 2018-10-07 DIAGNOSIS — R5383 Other fatigue: Secondary | ICD-10-CM

## 2018-10-07 DIAGNOSIS — N939 Abnormal uterine and vaginal bleeding, unspecified: Secondary | ICD-10-CM | POA: Insufficient documentation

## 2018-10-07 DIAGNOSIS — R9389 Abnormal findings on diagnostic imaging of other specified body structures: Secondary | ICD-10-CM

## 2018-10-07 MED ORDER — MEGESTROL ACETATE 40 MG PO TABS: 40.0000 mg | ORAL_TABLET | Freq: Every day | ORAL | 1 refills | Status: DC

## 2018-10-07 MED ORDER — MEDROXYPROGESTERONE ACETATE 150 MG/ML IM SUSP
150.0000 mg | Freq: Once | INTRAMUSCULAR | Status: AC
  Administered 2018-10-07: 150 mg via INTRAMUSCULAR

## 2018-10-07 NOTE — Addendum Note (Signed)
Addended by: Glenice Bow on: 10/07/2018 02:53 PM   Modules accepted: Orders

## 2018-10-07 NOTE — Progress Notes (Signed)
Pt states goes thru a pad every 30 mins, overnight pads every hour.

## 2018-10-07 NOTE — Progress Notes (Signed)
GYNECOLOGY OFFICE VISIT NOTE History:  53 y.o. [id number] here today for abnormal uterine bleeding. She was referred from her primary care clinic give ongoing AUB. Had U/S last week which showed significant thickening of endometrium (23.37mm).   - October 2019 - spotting, light period for a couple of days - November 2019 - skipped cycle - 08/22/18 - started bleeding and hasn't stopped since, occasionally has lighter days with pink spotting but mostly heavy days  changing pad every 30 minutes to an hour, feels blood coming out every time she moves or stands, has been messing up clothes, using largest sized pads - fatigue, dizziness, SOB - not sleeping well because waking up to change pads - had tubal reversal in 2018 because was hoping to have more children, saw REI, was told no signs of menopause  - mother menopause in late 38s - no family history of any GYN cancers   The following portions of the patient's history were reviewed and updated as appropriate: allergies, current medications, past family history, past medical history, past social history, past surgical history and problem list.   Health Maintenance:  Normal pap and negative HRHPV on 08/23/2014.    Review of Systems:  Pertinent items noted in HPI Review of Systems  Constitutional: Positive for malaise/fatigue. Negative for chills and weight loss.  Respiratory: Positive for shortness of breath.   Cardiovascular: Positive for palpitations. Negative for chest pain and leg swelling.  Genitourinary:       No cramping or pelvic pain  Neurological: Positive for dizziness and tingling (hands).  Psychiatric/Behavioral: The patient is nervous/anxious.    Objective:  Physical Exam BP 130/84 (BP Location: Left Arm)   Pulse 84   Ht 5' 1.5" (1.562 m)   Wt 190 lb 3.2 oz (86.3 kg)   BMI 35.36 kg/m  Physical Exam  Constitutional: She is oriented to person, place, and time. She appears well-developed and well-nourished. No distress.    HENT:  Head: Normocephalic and atraumatic.  Eyes: Conjunctivae and EOM are normal. No scleral icterus.  Cardiovascular: Normal rate.  Pulmonary/Chest: No respiratory distress.  Genitourinary:    Genitourinary Comments: Normal external genitalia  normal-appearing vaginal mucosa, dark maroon blood pooling/clotting  cervix without evidence of friability, blood coming through os when probed with Fox swab   Neurological: She is alert and oriented to person, place, and time.  Psychiatric: She has a normal mood and affect. Her behavior is normal.  Nursing note and vitals reviewed.  Labs and Imaging No results found for this or any previous visit (from the past 168 hour(s)). US Pelvic Complete With Transvaginal  Result Date: 10/02/2018 CLINICAL DATA:  Abnormal uterine bleeding for 45 days. The patient is premenopausal. EXAM: TRANSABDOMINAL AND TRANSVAGINAL ULTRASOUND OF PELVIS TECHNIQUE: Both transabdominal and transvaginal ultrasound examinations of the pelvis were performed. Transabdominal technique was performed for global imaging of the pelvis including uterus, ovaries, adnexal regions, and pelvic cul-de-sac. It was necessary to proceed with endovaginal exam following the transabdominal exam to visualize the ovaries and endometrium. COMPARISON:  None FINDINGS: Uterus Measurements: 16.1 x 6.4 x 7.0 cm = volume: 377 mL. No fibroids or other mass visualized. Endometrium Thickness: 23.5 mm.  No focal abnormality visualized. Right ovary Measurements: 4.2 x 3.1 x 3.3 cm = volume: 22.3 mL. Contains a 3 cm simple cyst. Left ovary Measurements: 4.1 x 2.0 x 2.7 cm = volume: 11.3 mL. Contains a 2.4 cm simple cyst. Other findings No abnormal free fluid. IMPRESSION: 1. The endometrium is  abnormally thickened, measuring 23.5 mm. If bleeding remains unresponsive to hormonal or medical therapy, focal lesion work-up with sonohysterogram should be considered. Endometrial biopsy should also be considered in  pre-menopausal patients at high risk for endometrial carcinoma. (Ref: Radiological Reasoning: Algorithmic Workup of Abnormal Vaginal Bleeding with Endovaginal Sonography and Sonohysterography. AJR 2008; [id number]:M35-36) 2. Bilateral ovarian follicles not requiring follow-up. Electronically Signed   By: Dorise Bullion III M.D   On: 10/02/2018 19:18    Assessment & Plan:  Mrs. Gupton is a 53yo female with PMH of HTN, Type II Diabetes, obesity, HLD, GAD, PCOS, IBS and recent history of abnormal uterine bleeding who presents to follow-up AUB. Discussed that based on U/S findings of endometrial thickening, differential diagnosis includes endometriosis, endometrial hyperplasia, perimenopausal changes, endometrial cancer among other causes.   Will repeat CBC given ongoing bleeding, was normal about 2 weeks ago  will also check TSH, prolactin, FSH  Reviewed U/S results with patient  Recommended endometrial biopsy (see procedure note below)  Depo today, megace 40mg  daily  Follow-up in 2 weeks with GYN to discuss results, possible D&C prn   ENDOMETRIAL BIOPSY     The indications for endometrial biopsy were reviewed.   Risks of the biopsy including cramping, bleeding, infection, uterine perforation, inadequate specimen and need for additional procedures  were discussed. The patient states she understands and agrees to undergo procedure today. Consent was signed. Time out was performed. During the pelvic exam, the cervix was prepped with Betadine. A single-toothed tenaculum was placed on the anterior lip of the cervix to stabilize it. The 3 mm pipelle was introduced into the endometrial cavity without difficulty to a depth of 7cm, and a moderate amount of tissue was obtained and sent to pathology. Two passes were made to ensure adequate sample volume. The instruments were removed from the patient's vagina. Minimal bleeding from the cervix was noted. The patient tolerated the procedure well. Routine post-procedure  instructions were given to the patient.    Total face-to-face time with patient: 30 minutes.  Over 50% of encounter was spent on counseling and coordination of care.  Lambert Mody. Juleen China, DO OB Family Medicine Fellow, [facility] for [employer], [place]

## 2018-10-13 ENCOUNTER — Telehealth: Payer: Self-pay

## 2018-10-13 NOTE — Telephone Encounter (Signed)
Called pt to give test results , no answer, left VM for call back.

## 2018-10-13 NOTE — Telephone Encounter (Signed)
Pt called thru nurse line today at 4:59p wanting test results.Asked for call back at [phone].

## 2018-10-13 NOTE — Telephone Encounter (Signed)
-----   Message from Glenice Bow, DO sent at 10/13/2018  4:34 PM EST ----- Patient with inadequate endometrial sampling. Will likely need repeat EMB with Dr. Hulan Fray during appointment 2/18. I called patient and left voicemail stating results available. Please reach out to patient to confirm labs were drawn (still just state collected instead of in process) and inform her of need to repeat sample. Thank you! -LSW

## 2018-10-14 LAB — VITAMIN D 1,25 DIHYDROXY
Vitamin D 1, 25 (OH)2 Total: 50 pg/mL
Vitamin D2 1, 25 (OH)2: <10 pg/mL
Vitamin D3 1, 25 (OH)2: 50 pg/mL

## 2018-10-14 LAB — FOLLICLE STIMULATING HORMONE: FSH: 8.4 m[IU]/mL

## 2018-10-14 LAB — CBC WITH DIFFERENTIAL/PLATELET
Basophils Absolute: 0.1 10*3/uL (ref 0.0–0.2)
Basos: 1 %
EOS (ABSOLUTE): 0 10*3/uL (ref 0.0–0.4)
Eos: 1 %
Hematocrit: 31.9 % — ABNORMAL LOW (ref 34.0–46.6)
Hemoglobin: 10.9 g/dL — ABNORMAL LOW (ref 11.1–15.9)
Immature Grans (Abs): 0 10*3/uL (ref 0.0–0.1)
Immature Granulocytes: 0 %
Lymphocytes Absolute: 1.6 10*3/uL (ref 0.7–3.1)
Lymphs: 40 %
MCH: 30.2 pg (ref 26.6–33.0)
MCHC: 34.2 g/dL (ref 31.5–35.7)
MCV: 88 fL (ref 79–97)
Monocytes Absolute: 0.4 10*3/uL (ref 0.1–0.9)
Monocytes: 9 %
Neutrophils Absolute: 1.9 10*3/uL (ref 1.4–7.0)
Neutrophils: 49 %
Platelets: 242 10*3/uL (ref 150–450)
RBC: 3.61 x10E6/uL — ABNORMAL LOW (ref 3.77–5.28)
RDW: 12.3 % (ref 11.7–15.4)
WBC: 3.9 10*3/uL (ref 3.4–10.8)

## 2018-10-14 LAB — TSH: TSH: 0.694 u[IU]/mL (ref 0.450–4.500)

## 2018-10-14 LAB — PROLACTIN: Prolactin: 11.5 ng/mL (ref 4.8–23.3)

## 2018-10-14 NOTE — Telephone Encounter (Signed)
Called pt and informed her of EMB results showing inadequate sample. I advised that the doctor will discuss with her further @ her visit on 2/18. It is very likely that she will require a repeat EMB sample to be taken at the time of her visit. Pt stated that the procedure was very painful and requested pain medicine prescription to be called to her pharmacy. I advised the recommendation of ibuprofen 800 mg 1-2 hours before appointment and she should take the medication with food. Pt voiced understanding.

## 2018-10-21 ENCOUNTER — Encounter: Payer: Self-pay | Admitting: Obstetrics & Gynecology

## 2018-10-21 ENCOUNTER — Ambulatory Visit (INDEPENDENT_AMBULATORY_CARE_PROVIDER_SITE_OTHER): Payer: No Typology Code available for payment source | Admitting: Obstetrics & Gynecology

## 2018-10-21 ENCOUNTER — Encounter (HOSPITAL_COMMUNITY): Payer: Self-pay

## 2018-10-21 VITALS — BP 140/81 | HR 77 | Ht 61.5 in | Wt 189.9 lb

## 2018-10-21 DIAGNOSIS — N939 Abnormal uterine and vaginal bleeding, unspecified: Secondary | ICD-10-CM

## 2018-10-21 DIAGNOSIS — R9389 Abnormal findings on diagnostic imaging of other specified body structures: Secondary | ICD-10-CM

## 2018-10-21 LAB — POCT PREGNANCY, URINE: Preg Test, Ur: NEGATIVE

## 2018-10-21 MED ORDER — MEGESTROL ACETATE 40 MG PO TABS: 40.0000 mg | ORAL_TABLET | Freq: Two times a day (BID) | ORAL | 5 refills | Status: DC

## 2018-10-21 NOTE — Progress Notes (Signed)
   Subjective:    Patient ID: Tracy Weiss, female    DOB: 26-May-1966, 53 y.o.   MRN: [id number]  HPI 53 yo single P2 (twin daughters 58 yo) for AUB. She has always had regular periods until 10/19. She then bled for 40 days. She had a EMBX that did not have enough cells. She had a depo provera in recently also and now she is not bleeding.   Her u/s showed a 23 mm endometrium.   Review of Systems She had a tubal reversal.    Objective:   Physical Exam Breathing, conversing, and ambulating normally Well nourished, well hydrated Black female, no apparent distress      Assessment & Plan:  Thickened endometrium with h/o DUB, no with no bleeding Plan for d&c. She was "traumatized" with the previous EMBX and she declines this today.

## 2018-10-24 ENCOUNTER — Encounter: Payer: No Typology Code available for payment source | Admitting: Family Medicine

## 2018-10-28 ENCOUNTER — Ambulatory Visit (INDEPENDENT_AMBULATORY_CARE_PROVIDER_SITE_OTHER): Payer: Self-pay | Admitting: Family Medicine

## 2018-10-28 VITALS — BP 110/80 | HR 87 | Temp 99.0°F | Wt 186.6 lb

## 2018-10-28 DIAGNOSIS — B379 Candidiasis, unspecified: Secondary | ICD-10-CM

## 2018-10-28 DIAGNOSIS — F902 Attention-deficit hyperactivity disorder, combined type: Secondary | ICD-10-CM

## 2018-10-28 MED ORDER — METHYLPHENIDATE HCL ER (LA) 30 MG PO CP24: 30.0000 mg | ORAL_CAPSULE | ORAL | 0 refills | Status: DC

## 2018-10-28 MED ORDER — FLUCONAZOLE 150 MG PO TABS: 150.0000 mg | ORAL_TABLET | Freq: Once | ORAL | 1 refills | Status: AC

## 2018-10-28 NOTE — Patient Instructions (Signed)
It was great seeing you today!  You have been having so much trouble with your yeast infection.  This is likely due to your diabetes.  Please follow-up with Dr. Tammi Klippel in about a month and a half for further management.  I refilled your Ritalin.  I sent in a Diflucan prescription to your pharmacy.

## 2018-10-29 DIAGNOSIS — B379 Candidiasis, unspecified: Secondary | ICD-10-CM | POA: Insufficient documentation

## 2018-10-29 NOTE — Progress Notes (Signed)
   HPI 53 year old who presents for 2-day history of vaginal itching.  She states this feels "just like a yeast infection".  She is apparently had a number of these in the past and states her symptoms are exactly like this.  Her main complaint is the itching but she says that she has had some slightly foul-smelling discharge as well.  Patient did not wish to have a female provider, no female provider can be arranged.  For this reason no wet prep was performed.  Patient also states she is about to go on a long trip and wishes to have her Ritalin refilled.  She states that she takes this when she needs it.  Reviewed [facility] controlled substance database and has not been filled since August 2019.  CC: Yeast infection   ROS:   Review of Systems See HPI for ROS.   CC, SH/smoking status, and VS noted  Objective: BP 110/80   Pulse 87   Temp 99 F (37.2 C) (Oral)   Wt 186 lb 9.6 oz (84.6 kg)   SpO2 98%   BMI 34.69 kg/m  Gen: Well-appearing 53 year old African-American female, no acute distress, resting comfortably CV: RRR, no murmur Resp: CTAB, no wheezes, non-labored Abd: SNTND, BS present, no guarding or organomegaly Neuro: Alert and oriented, Speech clear, No gross deficits GU: Exam deferred at patient request   Assessment and plan:  Yeast infection We will treat this as yeast infection.  Explained to patient that without exam there is no way to be sure treating the right thing.  Again deferred female examination, stating that she knows of these infection and that she just like to Diflucan.  She states that typically takes 2 doses Diflucan to resolve symptoms.  Explained that is important to follow-up if her symptoms do not resolve.  Attention deficit disorder Reviewed [facility] controlled substance database and patient is due for refill.  Last filled in August 2019.  30 mg daily, 30 pills given.   No orders of the defined types were placed in this encounter.   Meds  ordered this encounter  Medications  . methylphenidate (RITALIN LA) 30 MG 24 hr capsule    Sig: Take 1 capsule (30 mg total) by mouth every morning. Fill 30 days after prior refill.    Dispense:  30 capsule    Refill:  0  . fluconazole (DIFLUCAN) 150 MG tablet    Sig: Take 1 tablet (150 mg total) by mouth once for 1 dose.    Dispense:  2 tablet    Refill:  1     Guadalupe Dawn MD PGY-2 Family Medicine Resident  10/29/2018 3:46 PM

## 2018-10-29 NOTE — Assessment & Plan Note (Signed)
We will treat this as yeast infection.  Explained to patient that without exam there is no way to be sure treating the right thing.  Again deferred female examination, stating that she knows of these infection and that she just like to Diflucan.  She states that typically takes 2 doses Diflucan to resolve symptoms.  Explained that is important to follow-up if her symptoms do not resolve.

## 2018-10-29 NOTE — Assessment & Plan Note (Signed)
Reviewed [facility] controlled substance database and patient is due for refill.  Last filled in August 2019.  30 mg daily, 30 pills given.

## 2018-11-12 ENCOUNTER — Telehealth: Payer: Self-pay | Admitting: Obstetrics and Gynecology

## 2018-11-12 NOTE — Telephone Encounter (Signed)
The patient came in for assistance with the mychart feature because she needed to show her employer she had blood work done. She requested the results from this year as well outside of the pregnancy test. I also gave the patient a card with the mychart number and requested the patient sign a release as well.

## 2018-11-17 ENCOUNTER — Encounter (HOSPITAL_BASED_OUTPATIENT_CLINIC_OR_DEPARTMENT_OTHER): Payer: Self-pay | Admitting: *Deleted

## 2018-11-17 ENCOUNTER — Other Ambulatory Visit: Payer: Self-pay

## 2018-11-17 ENCOUNTER — Telehealth: Payer: Self-pay | Admitting: Family Medicine

## 2018-11-17 NOTE — Telephone Encounter (Signed)
Called patient to inform about the restrictions at the office due to the coronavirus. No answer, left detailed message with the restrictions and to give the office a call back if she had any questions or concerns.

## 2018-11-18 ENCOUNTER — Ambulatory Visit: Payer: No Typology Code available for payment source | Admitting: Obstetrics & Gynecology

## 2018-11-19 ENCOUNTER — Telehealth: Payer: Self-pay

## 2018-11-19 NOTE — Telephone Encounter (Signed)
Called patient, no answer, left message about surgery rescheduled date & time

## 2018-11-24 ENCOUNTER — Other Ambulatory Visit (HOSPITAL_COMMUNITY): Payer: Self-pay | Admitting: Obstetrics & Gynecology

## 2018-11-24 ENCOUNTER — Telehealth (INDEPENDENT_AMBULATORY_CARE_PROVIDER_SITE_OTHER): Payer: Self-pay | Admitting: Obstetrics & Gynecology

## 2018-11-24 ENCOUNTER — Other Ambulatory Visit: Payer: Self-pay

## 2018-11-24 DIAGNOSIS — N939 Abnormal uterine and vaginal bleeding, unspecified: Secondary | ICD-10-CM

## 2018-11-24 LAB — CBC
Hematocrit: 34.1 % (ref 34.0–46.6)
Hemoglobin: 10.8 g/dL — ABNORMAL LOW (ref 11.1–15.9)
MCH: 27.6 pg (ref 26.6–33.0)
MCHC: 31.7 g/dL (ref 31.5–35.7)
MCV: 87 fL (ref 79–97)
Platelets: 397 10*3/uL (ref 150–450)
RBC: 3.91 x10E6/uL (ref 3.77–5.28)
RDW: 12.4 % (ref 11.7–15.4)
WBC: 5.2 10*3/uL (ref 3.4–10.8)

## 2018-11-24 NOTE — Addendum Note (Signed)
Addended by: Emily Filbert on: 11/24/2018 11:02 AM   Modules accepted: Orders

## 2018-11-24 NOTE — Addendum Note (Signed)
Addended by: Dolores Hoose on: 11/24/2018 11:10 AM   Modules accepted: Orders

## 2018-11-24 NOTE — Addendum Note (Signed)
Addended by: Darliss Ridgel D on: 11/24/2018 11:10 AM   Modules accepted: Orders

## 2018-11-24 NOTE — Progress Notes (Signed)
   TELEHEALTH VIRTUAL GYNECOLOGY VISIT ENCOUNTER NOTE  I connected with Tracy Weiss on 11/24/18 at  8:55 AM EDT by telephone and verified that I am speaking with the correct person using two identifiers.   I discussed the limitations, risks, security and privacy concerns of performing an evaluation and management service by telephone and the availability of in person appointments. I also discussed with the patient that there may be a patient responsible charge related to this service. The patient expressed understanding and agreed to proceed.   History:  Tracy Weiss is a 53 y.o. G1P0 here today for DUB. I saw her 10/21/18 for this issue and she was scheduled for a D&C this week. In the past she had a EMBX but not enough cells were obtained for the pathologist. She refuses a repeat EMBX. She was given depo provera 2/20 but she is very tired of the bleeding. Her d&c has been rescheduled for 01/07/19.     Past Medical History:  Diagnosis Date  . Abnormal uterine bleeding (AUB)   . ADHD   . Anxiety   . Diabetes mellitus without complication ([place])   . Gallstones 09/2016   Past Surgical History:  Procedure Laterality Date  . CESAREAN SECTION    . CHOLECYSTECTOMY N/A 09/11/2016   Procedure: LAPAROSCOPIC CHOLECYSTECTOMY WITH INTRAOPERATIVE CHOLANGIOGRAM;  Surgeon: Donnie Mesa, MD;  Location: [facility];  Service: General;  Laterality: N/A;  . tubal ligaton reversal  2017   The following portions of the patient's history were reviewed and updated as appropriate: allergies, current medications, past family history, past medical history, past social history, past surgical history and problem list.  Health Maintenance:  Normal pap and negative HRHPV on 08/23/14.  Normal mammogram on 12/19    Review of Systems:  Pertinent items noted in HPI and remainder of comprehensive ROS otherwise negative. She is not currently working. Physical Exam:  Physical exam deferred due to nature of the encounter  Labs and  Imaging No results found for this or any previous visit (from the past 168 hour(s)). No results found.    Assessment and Plan:   DUB- I have rec'd that she stop the megace entirely for 2 weeks and then restart. Re check CBC I have told her that if her CBC is significantly lower than 6 weeks ago, then I will have to call her an emergent case and will move her d&c up.  No follow-ups on file.     I discussed the assessment and treatment plan with the patient. The patient was provided an opportunity to ask questions and all were answered. The patient agreed with the plan and demonstrated an understanding of the instructions.   The patient was advised to call back or seek an in-person evaluation if the symptoms worsen or if the condition fails to improve as anticipated.  I provided 10 minutes of non-face-to-face time during this encounter.    Emily Filbert, MD [facility] for [facility], [place]

## 2018-11-25 ENCOUNTER — Telehealth: Payer: Self-pay

## 2018-11-25 NOTE — Telephone Encounter (Addendum)
-----   Message from Emily Filbert, MD sent at 11/25/2018  8:21 AM EDT ----- Please let her know that her CBC is the same :( She should get on Mychart also  Notified pt provider's recommendation.  Pt asked if that will affect if she is having surgery.  I explained to the pt that the COVID-19 has changed surgery dates.  Pt stated that she does have surgery scheduled in May that someone from the surgery center told her about and wanted to know why no one has called her.  I informed the pt that surgery's are currently being canceled.  So the scheduler are calling those pts.  Pt stated understanding with no further questions.

## 2018-12-03 ENCOUNTER — Ambulatory Visit: Payer: Self-pay | Admitting: Family Medicine

## 2018-12-03 ENCOUNTER — Telehealth (INDEPENDENT_AMBULATORY_CARE_PROVIDER_SITE_OTHER): Payer: Self-pay | Admitting: Family Medicine

## 2018-12-03 ENCOUNTER — Other Ambulatory Visit: Payer: Self-pay | Admitting: Family Medicine

## 2018-12-03 ENCOUNTER — Other Ambulatory Visit: Payer: Self-pay

## 2018-12-03 DIAGNOSIS — E119 Type 2 diabetes mellitus without complications: Secondary | ICD-10-CM

## 2018-12-03 MED ORDER — METHYLPHENIDATE HCL ER (LA) 30 MG PO CP24: 30.0000 mg | ORAL_CAPSULE | ORAL | 0 refills | Status: DC

## 2018-12-03 MED ORDER — OLMESARTAN MEDOXOMIL-HCTZ 40-25 MG PO TABS: 1.0000 | ORAL_TABLET | Freq: Every day | ORAL | 1 refills | Status: DC

## 2018-12-03 MED ORDER — ALPRAZOLAM 0.25 MG PO TABS: 0.2500 mg | ORAL_TABLET | Freq: Every evening | ORAL | 0 refills | Status: DC | PRN

## 2018-12-03 MED ORDER — OLMESARTAN MEDOXOMIL-HCTZ 40-25 MG PO TABS: 1.0000 | ORAL_TABLET | Freq: Every day | ORAL | 0 refills | Status: DC

## 2018-12-03 NOTE — Progress Notes (Signed)
[facility] Telemedicine Visit  Patient consented to have visit conducted via telephone.  Encounter participants: Patient: Tracy Weiss  Provider: Annabell Sabal  Others (if applicable): n/a  Chief Complaint: refill for diabetic medications  HPI:  1.  Diabetes:  Currently on Lantus and Novolog.  Last refill for either was in January.  Has been receiving mostly samples from here as refills.   No adverse effects from medication.  No hypoglycemic events.  No paresthesia or peripheral nerve pain.  No polyuria/polydipsia.  Measures blood sugars at home every day or so.  She has seen one that was over 400.  Otherwise in the 200s.     Lab Results  Component Value Date   HGBA1C 9.9 (A) 09/26/2018    2.  Adult ADHD: Patient is on methylphenidate chronically.  Takes 30 mg long-acting once a day.  She reports this is been keeping things under control.  Reports being due for refill.  She also tells me that she has been taking 10 mg short acting on top of the 30 mg long-acting.  -Also mention needing refill for Benicar.  I will send this in.   ROS: No jitteriness.  No palpitations.  Pertinent PMHx: ADHD as above.  Also with irritable bowel syndrome.  And anxiety disorder  Exam: Gen:  Patient awake, alert, fully conversant, oriented x 4 Resp:  Good strong voice.  Speaking in full sentences.  No coughing during exam.  No respiratory distress.  No audible wheezing over the phone  Psych:  Not depressed or anxious sounding.  Linear and coherent thought process as evidenced by speech pattern.   Assessment/Plan:  1.  Diabetes: - Not controlled.   - She reports being unable to pay for either lantus or novolog.  Currently we are out of NovoLog samples. -She has some paperwork that she will bring by that we can fill out though allow her to obtain Lantus.  We can fill this out once she brings it in. -Recommended to continue to check blood sugars and bring in paperwork so that we can  fill her Lantus ASAP.  2.  ADHD: - I checked PMPA today.  She has not had a refill for her 10 mg methylphenidate in quite some time, over a year. -I refilled her 30 mg methylphenidate.  Follow-up with [name] the next month to see about scheduling for A1c, labs, etc.  Time spent on phone with patient: 10 minutes

## 2018-12-03 NOTE — Progress Notes (Signed)
[facility] contact [facility] [address] and ask them to cancel all medications I ordered there.  Should be xanax, methylphenidate, and benicar.    Thanks!

## 2018-12-03 NOTE — Progress Notes (Signed)
Called and canceled as requested. Christen Bame, CMA

## 2018-12-05 ENCOUNTER — Telehealth: Payer: Self-pay | Admitting: Family Medicine

## 2018-12-05 MED ORDER — METHYLPHENIDATE HCL ER (LA) 30 MG PO CP24: 30.0000 mg | ORAL_CAPSULE | ORAL | 0 refills | Status: DC

## 2018-12-05 MED ORDER — OLMESARTAN MEDOXOMIL-HCTZ 40-25 MG PO TABS: 1.0000 | ORAL_TABLET | Freq: Every day | ORAL | 1 refills | Status: DC

## 2018-12-05 MED ORDER — ALPRAZOLAM 0.25 MG PO TABS: 0.2500 mg | ORAL_TABLET | Freq: Every evening | ORAL | 0 refills | Status: DC | PRN

## 2018-12-05 NOTE — Telephone Encounter (Signed)
Pt informed and done. Deseree Kennon Holter, CMA

## 2018-12-05 NOTE — Telephone Encounter (Signed)
Please let pt know that Ativan, Ritalin, Benicar Rxs were sent to [facility] [address]  Please contact [facility] and DC prescription for Ativan, Ritalin and Benicar.

## 2018-12-05 NOTE — Telephone Encounter (Signed)
Pt needs her Xanax and [facility]in [facility] [address]. [facility] says [facility] these [facility] but then they were cancelled and according to her med list they were sent to [facility] on [address] and pt says none of her meds should ever be sent their. Please get this corrected and call pt when this has been done.

## 2018-12-05 NOTE — Telephone Encounter (Signed)
[facility] [address] and her prescription was called in into [facility] on [address] and [facility] to [facility] to. Patients states that [place] was done In error.

## 2018-12-29 ENCOUNTER — Telehealth (INDEPENDENT_AMBULATORY_CARE_PROVIDER_SITE_OTHER): Payer: Self-pay | Admitting: Family Medicine

## 2018-12-29 DIAGNOSIS — N939 Abnormal uterine and vaginal bleeding, unspecified: Secondary | ICD-10-CM

## 2018-12-29 NOTE — Telephone Encounter (Signed)
Patient called she want to touch base about her surgery, also said she have a few medical questions

## 2018-12-30 NOTE — Telephone Encounter (Signed)
Returned pt's call regarding her procedure. Pt states that instead of doing the [facility], she wants to do the ablation.  Sent message to Dr. Hulan Fray

## 2019-01-02 ENCOUNTER — Other Ambulatory Visit: Payer: Self-pay

## 2019-01-02 ENCOUNTER — Ambulatory Visit (INDEPENDENT_AMBULATORY_CARE_PROVIDER_SITE_OTHER): Payer: Self-pay | Admitting: *Deleted

## 2019-01-02 VITALS — BP 127/83 | HR 88 | Ht 62.0 in | Wt 183.7 lb

## 2019-01-02 DIAGNOSIS — N939 Abnormal uterine and vaginal bleeding, unspecified: Secondary | ICD-10-CM

## 2019-01-02 MED ORDER — MEDROXYPROGESTERONE ACETATE 150 MG/ML IM SUSP
150.0000 mg | Freq: Once | INTRAMUSCULAR | Status: AC
  Administered 2019-01-02: 150 mg via INTRAMUSCULAR

## 2019-01-02 NOTE — Progress Notes (Signed)
Last pap on record was 2015 which pt confirms is when she last had a pap smear.  Will schedule pap smear at next visit with injection.  Pt BP elevated.  Pt states she has not been taking her BP medication and just started taking pentermine today.  Advised pt to discuss with her pcp.  Pt verbalized understanding.   Repeat BP normal.   [name] here for Depo-Provera  Injection.  Injection administered without complication. Patient will return in 3 months for next injection.  Dolores Hoose, RN 01/02/2019  11:43 AM

## 2019-01-02 NOTE — Progress Notes (Signed)
Chart reviewed for nurse visit. Agree with plan of care.   Wende Mott, [place] 01/02/2019 1:57 PM

## 2019-01-09 ENCOUNTER — Encounter: Payer: Self-pay | Admitting: Internal Medicine

## 2019-01-09 ENCOUNTER — Encounter: Payer: Self-pay | Admitting: Obstetrics & Gynecology

## 2019-01-20 ENCOUNTER — Telehealth: Payer: Self-pay | Admitting: Family Medicine

## 2019-01-20 NOTE — Telephone Encounter (Signed)
Patient called in today to verify her upcoming appt,  And she also said she's been bleeding since she got her Depo on May 09-2018, she said it was given to her in the arm and she don't think the nurse gave it to her correctly, she said when the nurse came in the room she didn't say anything, did ask her name and date of birth just told her to pull her sleeve up, patient is very worried and would like to speak to you

## 2019-01-27 ENCOUNTER — Telehealth: Payer: Self-pay | Admitting: Obstetrics & Gynecology

## 2019-01-27 MED ORDER — FLUCONAZOLE 150 MG PO TABS: 150.0000 mg | ORAL_TABLET | Freq: Once | ORAL | 1 refills | Status: AC

## 2019-01-27 NOTE — Telephone Encounter (Signed)
Called the patient to confirm upcoming appointment. Left a detailed voicemail message and how to access the mychart visit.

## 2019-01-27 NOTE — Telephone Encounter (Signed)
Called pt stated that she gets recurrent yeast due to her diabetes.  I advised to the pt that we can send in an Rx for Diflucan.  Pt asked if we can [facility] it to [facility] [employer] [facility] [address].  Pt stated thank you with no further questions.

## 2019-01-27 NOTE — Telephone Encounter (Signed)
Called patient to make sure she had the MyChart app. She was sent a link to get signed in. Stayed on the phone with her to make sure she had it. She stated she needed something for yeast. She is requesting a call back from the nurse so they can call her something in.

## 2019-01-27 NOTE — Addendum Note (Signed)
Addended by: Michel Harrow on: 01/27/2019 04:19 PM   Modules accepted: Orders

## 2019-01-28 ENCOUNTER — Other Ambulatory Visit: Payer: Self-pay

## 2019-01-28 ENCOUNTER — Telehealth (INDEPENDENT_AMBULATORY_CARE_PROVIDER_SITE_OTHER): Payer: Self-pay | Admitting: Obstetrics & Gynecology

## 2019-01-28 DIAGNOSIS — Z794 Long term (current) use of insulin: Secondary | ICD-10-CM

## 2019-01-28 DIAGNOSIS — N939 Abnormal uterine and vaginal bleeding, unspecified: Secondary | ICD-10-CM

## 2019-01-28 DIAGNOSIS — R9389 Abnormal findings on diagnostic imaging of other specified body structures: Secondary | ICD-10-CM

## 2019-01-28 DIAGNOSIS — E119 Type 2 diabetes mellitus without complications: Secondary | ICD-10-CM

## 2019-01-28 NOTE — Progress Notes (Signed)
   TELEHEALTH VIRTUAL GYNECOLOGY VISIT ENCOUNTER NOTE  I connected with Tracy Weiss on 01/28/19 at  2:35 PM EDT by telephone at home and verified that I am speaking with the correct person using two identifiers.   I discussed the limitations, risks, security and privacy concerns of performing an evaluation and management service by telephone and the availability of in person appointments. I also discussed with the patient that there may be a patient responsible charge related to this service. The patient expressed understanding and agreed to proceed.   History:  Tracy Weiss is a 53 y.o. G1P0 female being evaluated today for DUB. She has had an EMBX but not enough cells. She had a depo provera shot most recently on 01/02/19. Her bleeding is greatly improved. She had a gyn u/s on 10/02/18 that showed a 2.3 cm endometrium.      Past Medical History:  Diagnosis Date  . Abnormal uterine bleeding (AUB)   . ADHD   . Anxiety   . Diabetes mellitus without complication ([place])   . Gallstones 09/2016   Past Surgical History:  Procedure Laterality Date  . CESAREAN SECTION    . CHOLECYSTECTOMY N/A 09/11/2016   Procedure: LAPAROSCOPIC CHOLECYSTECTOMY WITH INTRAOPERATIVE CHOLANGIOGRAM;  Surgeon: Donnie Mesa, MD;  Location: [facility];  Service: General;  Laterality: N/A;  . tubal ligaton reversal  2017   The following portions of the patient's history were reviewed and updated as appropriate: allergies, current medications, past family history, past medical history, past social history, past surgical history and problem list.     Review of Systems:  Pertinent items noted in HPI and remainder of comprehensive ROS otherwise negative.  Physical Exam:   General:  Alert, oriented and cooperative.   Mental Status: Normal mood and affect perceived. Normal judgment and thought content.  Physical exam deferred due to nature of the encounter  Labs and Imaging No results found for this or any previous visit (from  the past 336 hour(s)). No results found.    Assessment and Plan:     DUB- for d&c, she also wants the ablation, understands that pregnancy is contraindicated. Her long term partner is willing to use condoms until menopause occurs.     We discussed her insulin dose the morning of surgery. She will text me her level and I will let her know what to do. She sometimes does not use insulin in the morning if they are lower than 100.    I discussed the assessment and treatment plan with the patient. The patient was provided an opportunity to ask questions and all were answered. The patient agreed with the plan and demonstrated an understanding of the instructions.   The patient was advised to call back or seek an in-person evaluation/go to the ED if the symptoms worsen or if the condition fails to improve as anticipated.  I provided 10 minutes of non-face-to-face time during this encounter.   Emily Filbert, MD [facility] for [facility], [place]

## 2019-01-30 ENCOUNTER — Encounter (HOSPITAL_BASED_OUTPATIENT_CLINIC_OR_DEPARTMENT_OTHER): Payer: Self-pay | Admitting: *Deleted

## 2019-01-30 ENCOUNTER — Other Ambulatory Visit: Payer: Self-pay

## 2019-02-02 ENCOUNTER — Other Ambulatory Visit (HOSPITAL_COMMUNITY)
Admission: RE | Admit: 2019-02-02 | Discharge: 2019-02-02 | Disposition: A | Discharge status: Home / Self Care | Payer: HRSA Program | Source: Ambulatory Visit | Attending: Obstetrics & Gynecology | Admitting: Obstetrics & Gynecology

## 2019-02-02 ENCOUNTER — Other Ambulatory Visit: Payer: Self-pay

## 2019-02-02 ENCOUNTER — Encounter (HOSPITAL_BASED_OUTPATIENT_CLINIC_OR_DEPARTMENT_OTHER)
Admission: RE | Admit: 2019-02-02 | Discharge: 2019-02-02 | Disposition: A | Discharge status: Home / Self Care | Payer: Self-pay | Source: Ambulatory Visit | Attending: Obstetrics & Gynecology | Admitting: Obstetrics & Gynecology

## 2019-02-02 DIAGNOSIS — Z1159 Encounter for screening for other viral diseases: Secondary | ICD-10-CM | POA: Insufficient documentation

## 2019-02-02 DIAGNOSIS — Z01818 Encounter for other preprocedural examination: Secondary | ICD-10-CM | POA: Insufficient documentation

## 2019-02-02 LAB — POCT PREGNANCY, URINE: Preg Test, Ur: NEGATIVE

## 2019-02-02 LAB — BASIC METABOLIC PANEL
Anion gap: 8 (ref 5–15)
BUN: 8 mg/dL (ref 6–20)
CO2: 24 mmol/L (ref 22–32)
Calcium: 9.3 mg/dL (ref 8.9–10.3)
Chloride: 100 mmol/L (ref 98–111)
Creatinine, Ser: 0.86 mg/dL (ref 0.44–1.00)
GFR calc Af Amer: >60 mL/min (ref >60)
GFR calc non Af Amer: >60 mL/min (ref >60)
Glucose, Bld: 325 mg/dL — ABNORMAL HIGH (ref 70–99)
Potassium: 3.6 mmol/L (ref 3.5–5.1)
Sodium: 132 mmol/L — ABNORMAL LOW (ref 135–145)

## 2019-02-02 LAB — CBC
HCT: 38.1 % (ref 36.0–46.0)
Hemoglobin: 12.3 g/dL (ref 12.0–15.0)
MCH: 26.5 pg (ref 26.0–34.0)
MCHC: 32.3 g/dL (ref 30.0–36.0)
MCV: 82.1 fL (ref 80.0–100.0)
Platelets: 358 10*3/uL (ref 150–400)
RBC: 4.64 MIL/uL (ref 3.87–5.11)
RDW: 14.6 % (ref 11.5–15.5)
WBC: 6.6 10*3/uL (ref 4.0–10.5)
nRBC: 0 % (ref 0.0–0.2)

## 2019-02-02 LAB — TYPE AND SCREEN
ABO/RH(D): AB POS
Antibody Screen: NEGATIVE

## 2019-02-02 LAB — ABO/RH: ABO/RH(D): AB POS

## 2019-02-02 NOTE — Progress Notes (Signed)
Labs, EKG and urine pregnancy PAT complete. Orders for ERAS but pt is IDDM and there were no gatorade avaible to give.

## 2019-02-02 NOTE — Telephone Encounter (Signed)
Per review, patient had appt with Dr Hulan Fray last week. Called patient and asked if she still had concerns or questions about her depo injections. Patient states no Dr Hulan Fray was able to answer all her questions. Patient had no questions today.

## 2019-02-03 NOTE — Progress Notes (Signed)
Reviewed results from labs with Dr. Marcell Barlow with particular attention to Na 132 and Gluc 325.  Also reviewed EKG with Dr. Marcell Barlow.  Okay to proceed with surgery as scheduled.

## 2019-02-04 ENCOUNTER — Other Ambulatory Visit: Payer: Self-pay

## 2019-02-04 ENCOUNTER — Encounter (HOSPITAL_BASED_OUTPATIENT_CLINIC_OR_DEPARTMENT_OTHER)
Admission: RE | Disposition: A | Discharge status: Home / Self Care | Payer: Self-pay | Source: Home / Self Care | Attending: Obstetrics & Gynecology

## 2019-02-04 ENCOUNTER — Ambulatory Visit (HOSPITAL_BASED_OUTPATIENT_CLINIC_OR_DEPARTMENT_OTHER): Payer: Self-pay | Admitting: Certified Registered Nurse Anesthetist

## 2019-02-04 ENCOUNTER — Ambulatory Visit (HOSPITAL_BASED_OUTPATIENT_CLINIC_OR_DEPARTMENT_OTHER)
Admission: RE | Admit: 2019-02-04 | Discharge: 2019-02-04 | Disposition: A | Discharge status: Home / Self Care | Payer: Self-pay | Attending: Obstetrics & Gynecology | Admitting: Obstetrics & Gynecology

## 2019-02-04 ENCOUNTER — Encounter (HOSPITAL_BASED_OUTPATIENT_CLINIC_OR_DEPARTMENT_OTHER): Payer: Self-pay | Admitting: Anesthesiology

## 2019-02-04 ENCOUNTER — Other Ambulatory Visit: Payer: Self-pay | Admitting: Obstetrics & Gynecology

## 2019-02-04 DIAGNOSIS — I10 Essential (primary) hypertension: Secondary | ICD-10-CM | POA: Insufficient documentation

## 2019-02-04 DIAGNOSIS — Z7982 Long term (current) use of aspirin: Secondary | ICD-10-CM | POA: Insufficient documentation

## 2019-02-04 DIAGNOSIS — R9389 Abnormal findings on diagnostic imaging of other specified body structures: Secondary | ICD-10-CM | POA: Insufficient documentation

## 2019-02-04 DIAGNOSIS — I517 Cardiomegaly: Secondary | ICD-10-CM

## 2019-02-04 DIAGNOSIS — N938 Other specified abnormal uterine and vaginal bleeding: Secondary | ICD-10-CM | POA: Insufficient documentation

## 2019-02-04 DIAGNOSIS — N939 Abnormal uterine and vaginal bleeding, unspecified: Secondary | ICD-10-CM

## 2019-02-04 DIAGNOSIS — E119 Type 2 diabetes mellitus without complications: Secondary | ICD-10-CM | POA: Insufficient documentation

## 2019-02-04 DIAGNOSIS — F909 Attention-deficit hyperactivity disorder, unspecified type: Secondary | ICD-10-CM | POA: Insufficient documentation

## 2019-02-04 DIAGNOSIS — Z9889 Other specified postprocedural states: Secondary | ICD-10-CM

## 2019-02-04 DIAGNOSIS — E1169 Type 2 diabetes mellitus with other specified complication: Secondary | ICD-10-CM

## 2019-02-04 DIAGNOSIS — Z794 Long term (current) use of insulin: Secondary | ICD-10-CM | POA: Insufficient documentation

## 2019-02-04 DIAGNOSIS — Z79899 Other long term (current) drug therapy: Secondary | ICD-10-CM | POA: Insufficient documentation

## 2019-02-04 DIAGNOSIS — F419 Anxiety disorder, unspecified: Secondary | ICD-10-CM | POA: Insufficient documentation

## 2019-02-04 HISTORY — DX: Abnormal uterine and vaginal bleeding, unspecified: N93.9

## 2019-02-04 HISTORY — DX: Attention-deficit hyperactivity disorder, unspecified type: F90.9

## 2019-02-04 HISTORY — DX: Anxiety disorder, unspecified: F41.9

## 2019-02-04 HISTORY — PX: DILATATION AND CURETTAGE/HYSTEROSCOPY WITH MINERVA: SHX6851

## 2019-02-04 LAB — GLUCOSE, CAPILLARY
Glucose-Capillary: 171 mg/dL — ABNORMAL HIGH (ref 70–99)
Glucose-Capillary: 136 mg/dL — ABNORMAL HIGH (ref 70–99)

## 2019-02-04 LAB — NOVEL CORONAVIRUS, NAA (HOSP ORDER, SEND-OUT TO REF LAB; TAT 18-24 HRS): SARS-CoV-2, NAA: NOT DETECTED

## 2019-02-04 SURGERY — DILATATION AND CURETTAGE/HYSTEROSCOPY WITH MINERVA
Anesthesia: General | Site: Uterus

## 2019-02-04 MED ORDER — LIDOCAINE 2% (20 MG/ML) 5 ML SYRINGE
INTRAMUSCULAR | Status: DC | PRN
  Administered 2019-02-04: 60 mg via INTRAVENOUS

## 2019-02-04 MED ORDER — LACTATED RINGERS IV SOLN
INTRAVENOUS | Status: DC
  Administered 2019-02-04: 13:00:00 via INTRAVENOUS

## 2019-02-04 MED ORDER — FENTANYL CITRATE (PF) 100 MCG/2ML IJ SOLN
INTRAMUSCULAR | Status: AC
  Filled 2019-02-04: qty 2

## 2019-02-04 MED ORDER — MEPERIDINE HCL 25 MG/ML IJ SOLN: 6.2500 mg | INTRAMUSCULAR | Status: DC | PRN

## 2019-02-04 MED ORDER — PROMETHAZINE HCL 25 MG/ML IJ SOLN: 6.2500 mg | INTRAMUSCULAR | Status: DC | PRN

## 2019-02-04 MED ORDER — LACTATED RINGERS IV SOLN: INTRAVENOUS | Status: DC

## 2019-02-04 MED ORDER — OXYCODONE HCL 5 MG PO TABS: 5.0000 mg | ORAL_TABLET | Freq: Once | ORAL | Status: DC | PRN

## 2019-02-04 MED ORDER — PHENYLEPHRINE 40 MCG/ML (10ML) SYRINGE FOR IV PUSH (FOR BLOOD PRESSURE SUPPORT)
PREFILLED_SYRINGE | INTRAVENOUS | Status: DC | PRN
  Administered 2019-02-04 (×3): 80 ug via INTRAVENOUS

## 2019-02-04 MED ORDER — ONDANSETRON HCL 4 MG/2ML IJ SOLN
INTRAMUSCULAR | Status: DC | PRN
  Administered 2019-02-04: 4 mg via INTRAVENOUS

## 2019-02-04 MED ORDER — MIDAZOLAM HCL 2 MG/2ML IJ SOLN: 1.0000 mg | INTRAMUSCULAR | Status: DC | PRN

## 2019-02-04 MED ORDER — PROPOFOL 10 MG/ML IV BOLUS
INTRAVENOUS | Status: DC | PRN
  Administered 2019-02-04: 200 mg via INTRAVENOUS

## 2019-02-04 MED ORDER — BUPIVACAINE HCL (PF) 0.5 % IJ SOLN
INTRAMUSCULAR | Status: DC | PRN
  Administered 2019-02-04: 30 mL

## 2019-02-04 MED ORDER — ACETAMINOPHEN 325 MG PO TABS: 325.0000 mg | ORAL_TABLET | Freq: Once | ORAL | Status: DC | PRN

## 2019-02-04 MED ORDER — MIDAZOLAM HCL 2 MG/2ML IJ SOLN
INTRAMUSCULAR | Status: AC
  Filled 2019-02-04: qty 2

## 2019-02-04 MED ORDER — SCOPOLAMINE 1 MG/3DAYS TD PT72: 1.0000 | MEDICATED_PATCH | Freq: Once | TRANSDERMAL | Status: DC | PRN

## 2019-02-04 MED ORDER — FENTANYL CITRATE (PF) 100 MCG/2ML IJ SOLN
INTRAMUSCULAR | Status: DC | PRN
  Administered 2019-02-04: 50 ug via INTRAVENOUS

## 2019-02-04 MED ORDER — ACETAMINOPHEN 160 MG/5ML PO SOLN: 325.0000 mg | Freq: Once | ORAL | Status: DC | PRN

## 2019-02-04 MED ORDER — FENTANYL CITRATE (PF) 100 MCG/2ML IJ SOLN: 50.0000 ug | INTRAMUSCULAR | Status: DC | PRN

## 2019-02-04 MED ORDER — ONDANSETRON HCL 4 MG/2ML IJ SOLN
INTRAMUSCULAR | Status: AC
  Filled 2019-02-04: qty 2

## 2019-02-04 MED ORDER — ACETAMINOPHEN 10 MG/ML IV SOLN: 1000.0000 mg | Freq: Once | INTRAVENOUS | Status: DC | PRN

## 2019-02-04 MED ORDER — MIDAZOLAM HCL 5 MG/5ML IJ SOLN
INTRAMUSCULAR | Status: DC | PRN
  Administered 2019-02-04: 2 mg via INTRAVENOUS

## 2019-02-04 MED ORDER — DEXAMETHASONE SODIUM PHOSPHATE 10 MG/ML IJ SOLN
INTRAMUSCULAR | Status: DC | PRN
  Administered 2019-02-04: 4 mg via INTRAVENOUS

## 2019-02-04 MED ORDER — FENTANYL CITRATE (PF) 100 MCG/2ML IJ SOLN: 25.0000 ug | INTRAMUSCULAR | Status: DC | PRN

## 2019-02-04 MED ORDER — LIDOCAINE 2% (20 MG/ML) 5 ML SYRINGE
INTRAMUSCULAR | Status: AC
  Filled 2019-02-04: qty 5

## 2019-02-04 MED ORDER — OXYCODONE HCL 5 MG/5ML PO SOLN: 5.0000 mg | Freq: Once | ORAL | Status: DC | PRN

## 2019-02-04 MED ORDER — DEXAMETHASONE SODIUM PHOSPHATE 10 MG/ML IJ SOLN
INTRAMUSCULAR | Status: AC
  Filled 2019-02-04: qty 1

## 2019-02-04 SURGICAL SUPPLY — 6 items
BRIEF STRETCH FOR OB PAD XXL (UNDERPADS AND DIAPERS) ×1 IMPLANT
HANDPIECE ABLA MINERVA ENDO (MISCELLANEOUS) ×1 IMPLANT
PACK VAGINAL MINOR WOMEN LF (CUSTOM PROCEDURE TRAY) ×1 IMPLANT
PAD OB MATERNITY 4.3X12.25 (PERSONAL CARE ITEMS) ×1 IMPLANT
PAD PREP 24X48 CUFFED NSTRL (MISCELLANEOUS) ×1 IMPLANT
SLEEVE SCD COMPRESS KNEE MED (MISCELLANEOUS) ×1 IMPLANT

## 2019-02-04 NOTE — Anesthesia Procedure Notes (Signed)
Procedure Name: LMA Insertion Date/Time: 02/04/2019 1:10 PM Performed by: Genelle Bal, CRNA Pre-anesthesia Checklist: Patient identified, Emergency Drugs available, Suction available and Patient being monitored Patient Re-evaluated:Patient Re-evaluated prior to induction Oxygen Delivery Method: Circle system utilized Preoxygenation: Pre-oxygenation with 100% oxygen Induction Type: IV induction Ventilation: Mask ventilation without difficulty LMA: LMA inserted LMA Size: 4.0 Number of attempts: 1 Airway Equipment and Method: Bite block Placement Confirmation: positive ETCO2 Tube secured with: Tape Dental Injury: Teeth and Oropharynx as per pre-operative assessment

## 2019-02-04 NOTE — H&P (Signed)
Tracy Weiss is an 53 y.o. single P2 here for d&c due to AUB. She has been bleeding pretty much bleeding daily since 10/19. I did a EMBX but did not get enough cells. She got a depo provera shot 2/20 and then again last month. She would like an ablation but her uterus measured 16 cm on u/s.   Patient's last menstrual period was 06/18/2018.    Past Medical History:  Diagnosis Date  . Abnormal uterine bleeding (AUB)   . Abnormal uterine bleeding (AUB)   . ADHD   . Anxiety   . Diabetes mellitus without complication ([place])   . Gallstones 09/2016  . Hypertension     Past Surgical History:  Procedure Laterality Date  . CESAREAN SECTION    . CHOLECYSTECTOMY N/A 09/11/2016   Procedure: LAPAROSCOPIC CHOLECYSTECTOMY WITH INTRAOPERATIVE CHOLANGIOGRAM;  Surgeon: Donnie Mesa, MD;  Location: [facility];  Service: General;  Laterality: N/A;  . tubal ligaton reversal  2017    Family History  Problem Relation Age of Onset  . Cancer Mother        Metastatic with Unknown Primary; Stomach was involved  . Diabetes Father   . Heart disease Father   . Breast cancer Neg Hx     Social History:  reports that she has never smoked. She has never used smokeless tobacco. She reports current alcohol use. She reports that she does not use drugs.  Allergies: No Known Allergies  Medications Prior to Admission  Medication Sig Dispense Refill Last Dose  . ALPRAZolam (XANAX) 0.25 MG tablet Take 0.25 mg by mouth at bedtime as needed for anxiety.   Past Week at Unknown time  . aspirin 81 MG chewable tablet Chew by mouth daily.   Past Week at Unknown time  . cycloSPORINE (RESTASIS) 0.05 % ophthalmic emulsion Place 1 drop into both eyes 2 (two) times daily as needed (dry eyes).   02/03/2019 at Unknown time  . insulin aspart (NOVOLOG FLEXPEN) 100 UNIT/ML FlexPen Inject 10 Units into the skin 3 (three) times daily with meals. 1 pen 0 02/03/2019 at Unknown time  . Insulin Glargine (LANTUS) 100 UNIT/ML Solostar Pen Inject 30  Units into the skin daily at 10 pm. 6 mL 0 02/03/2019 at Unknown time  . methylphenidate (RITALIN LA) 30 MG 24 hr capsule Take 30 mg by mouth every morning.   Past Week at Unknown time  . olmesartan-hydrochlorothiazide (BENICAR HCT) 40-25 MG tablet Take 1 tablet by mouth daily.   02/03/2019 at Unknown time  . phentermine 37.5 MG capsule Take 37.5 mg by mouth every morning.   Past Month at Unknown time    ROS  Lives with her daughters. Monogamous for 22 years, he lives out of town.  Blood pressure 107/70, pulse 94, temperature 98.5 F (36.9 C), temperature source Oral, resp. rate 16, height 5\' 1"  (1.549 m), weight 80.6 kg, last menstrual period 06/18/2018, SpO2 100 %. Physical Exam  Heart- rrr Lungs- CTAB Abd- benign  No results found for this or any previous visit (from the past 24 hour(s)).  No results found.  Assessment/Plan: DUB with thickened endometrium Plan for d&c, Ablation if her uterus measures 10 cm or less  She understands the risks of surgery, including, but not to infection, bleeding, DVTs, damage to bowel, bladder, ureters. She wishes to proceed.     Emily Filbert 02/04/2019, 12:34 PM

## 2019-02-04 NOTE — Discharge Instructions (Signed)
Dilation and Curettage or Vacuum Curettage, Care After This sheet gives you information about how to care for yourself after your procedure. Your health care provider may also give you more specific instructions. If you have problems or questions, contact your health care provider. What can I expect after the procedure? After your procedure, it is common to have:  Mild pain or cramping.  Some vaginal bleeding or spotting. These may last for up to 2 weeks after your procedure. Follow these instructions at home: Activity   Do not drive or use heavy machinery while taking prescription pain medicine.  Avoid driving for the first 24 hours after your procedure.  Take frequent, short walks, followed by rest periods, throughout the day. Ask your health care provider what activities are safe for you. After 1-2 days, you may be able to return to your normal activities.  Do not lift anything heavier than 10 lb (4.5 kg) until your health care provider approves.  For at least 2 weeks, or as long as told by your health care provider, do not: ? Douche. ? Use tampons. ? Have sexual intercourse. General instructions   Take over-the-counter and prescription medicines only as told by your health care provider. This is especially important if you take blood thinning medicine.  Do not take baths, swim, or use a hot tub until your health care provider approves. Take showers instead of baths.  Wear compression stockings as told by your health care provider. These stockings help to prevent blood clots and reduce swelling in your legs.  It is your responsibility to get the results of your procedure. Ask your health care provider, or the department performing the procedure, when your results will be ready.  Keep all follow-up visits as told by your health care provider. This is important. Contact a health care provider if:  You have severe cramps that get worse or that do not get better with  medicine.  You have severe abdominal pain.  You cannot drink fluids without vomiting.  You develop pain in a different area of your pelvis.  You have bad-smelling vaginal discharge.  You have a rash. Get help right away if:  You have vaginal bleeding that soaks more than one sanitary pad in 1 hour, for 2 hours in a row.  You pass large blood clots from your vagina.  You have a fever that is above 100.71F (38.0C).  Your abdomen feels very tender or hard.  You have chest pain.  You have shortness of breath.  You cough up blood.  You feel dizzy or light-headed.  You faint.  You have pain in your neck or shoulder area. This information is not intended to replace advice given to you by your health care provider. Make sure you discuss any questions you have with your health care provider. Document Released: 08/17/2000 Document Revised: 04/18/2016 Document Reviewed: 03/22/2016 Elsevier Interactive Patient Education  2019 [facility] Instructions  Activity: Get plenty of rest for the remainder of the day. A responsible individual must stay with you for 24 hours following the procedure.  For the next 24 hours, DO NOT: -Drive a car -Paediatric nurse -Drink alcoholic beverages -Take any medication unless instructed by your physician -Make any legal decisions or sign important papers.  Meals: Start with liquid foods such as gelatin or soup. Progress to regular foods as tolerated. Avoid greasy, spicy, heavy foods. If nausea and/or vomiting occur, drink only clear liquids until the  nausea and/or vomiting subsides. Call your physician if vomiting continues.  Special Instructions/Symptoms: Your throat may feel dry or sore from the anesthesia or the breathing tube placed in your throat during surgery. If this causes discomfort, gargle with warm salt water. The discomfort should disappear within 24 hours.  If you had a scopolamine patch placed  behind your ear for the management of post- operative nausea and/or vomiting:  1. The medication in the patch is effective for 72 hours, after which it should be removed.  Wrap patch in a tissue and discard in the trash. Wash hands thoroughly with soap and water. 2. You may remove the patch earlier than 72 hours if you experience unpleasant side effects which may include dry mouth, dizziness or visual disturbances. 3. Avoid touching the patch. Wash your hands with soap and water after contact with the patch.

## 2019-02-04 NOTE — Anesthesia Preprocedure Evaluation (Addendum)
Anesthesia Evaluation  Patient identified by MRN, date of birth, ID band Patient awake    Reviewed: Allergy & Precautions, NPO status , Patient's Chart, lab work & pertinent test results  Airway Mallampati: I  TM Distance: >3 FB Neck ROM: Full    Dental  (+) Teeth Intact, Dental Advisory Given   Pulmonary neg pulmonary ROS,    breath sounds clear to auscultation       Cardiovascular hypertension, Pt. on medications  Rhythm:Regular Rate:Normal     Neuro/Psych Anxiety negative neurological ROS     GI/Hepatic negative GI ROS, Neg liver ROS,   Endo/Other  diabetes, Type 2, Insulin Dependent  Renal/GU      Musculoskeletal negative musculoskeletal ROS (+)   Abdominal Normal abdominal exam  (+)   Peds  Hematology negative hematology ROS (+)   Anesthesia Other Findings   Reproductive/Obstetrics                            Anesthesia Physical Anesthesia Plan  ASA: II  Anesthesia Plan: General   Post-op Pain Management:    Induction: Intravenous  PONV Risk Score and Plan: 4 or greater and Ondansetron, Dexamethasone, Midazolam and Scopolamine patch - Pre-op  Airway Management Planned: LMA  Additional Equipment: None  Intra-op Plan:   Post-operative Plan: Extubation in OR  Informed Consent: I have reviewed the patients History and Physical, chart, labs and discussed the procedure including the risks, benefits and alternatives for the proposed anesthesia with the patient or authorized representative who has indicated his/her understanding and acceptance.     Dental advisory given  Plan Discussed with: CRNA  Anesthesia Plan Comments: (COVID-19 Labs  No results for input(s): DDIMER, FERRITIN, LDH, CRP in the last 72 hours.  Lab Results      Component                Value               Date                      [id number]              NOT DETECTED        02/02/2019            )        Anesthesia Quick Evaluation

## 2019-02-04 NOTE — Op Note (Signed)
02/04/2019  1:45 PM  PATIENT:  Tracy Weiss  53 y.o. female  PRE-OPERATIVE DIAGNOSIS:  Abnormal uterine bleeding  POST-OPERATIVE DIAGNOSIS:  same  PROCEDURE:  Procedure(s) with comments: DILATATION AND CURETTAGE/ABLATION WITH MINERVA (N/A) - MINERVA ABLATION  SURGEON:  Surgeon(s) and Role:    * Trayvion Embleton C, MD - Primary  ANESTHESIA:   local and general  EBL:  10 mL   BLOOD ADMINISTERED:none  DRAINS: none   LOCAL MEDICATIONS USED:  MARCAINE     SPECIMEN:  Source of Specimen:  endometrial curettings  DISPOSITION OF SPECIMEN:  PATHOLOGY  COUNTS:  YES  TOURNIQUET:  * No tourniquets in log *  DICTATION: .Dragon Dictation  PLAN OF CARE: Discharge to home after PACU  PATIENT DISPOSITION:  PACU - hemodynamically stable.   Delay start of Pharmacological VTE agent (>24hrs) due to surgical blood loss or risk of bleeding: not applicable  The risks, benefits, and alternatives of surgery were explained, understood, and accepted. All questions were answered. Consents were signed. In the operating room general anesthesia was applied without complication, and she was placed in the dorsal lithotomy position. Her vagina was prepped and draped in the usual sterile fashion.  A bimanual exam revealed an upper limit of normal size and shape, anteverted mobile uterus. Her adnexa were nonenlarged. Her pubic arch is fair if she needs a vaginal hysterectomy in the future. A speculum was placed and a single-tooth tenaculum was used to grasp the anterior lip of her cervix. A total of 30 mL of 0.5% Marcaine was used to perform a paracervical block. Her uterus sounded to 10 cm and her cervical length was 5 cm. Her cervix was carefully and slowly dilated to accommodate a small curette. A curettage was done in all quadrants and the fundus of the uterus. A small amount of polypoid-type  tissue was obtained.  I then proceeded with the Minerva procedure. The procedure proceeded as expected. There was no bleeding  noted at the end of the case. She was taken to the recovery room after being extubated. She tolerated the procedure well.

## 2019-02-04 NOTE — Transfer of Care (Signed)
Immediate Anesthesia Transfer of Care Note  Patient: Tracy Weiss  Procedure(s) Performed: DILATATION AND CURETTAGE/ABLATION WITH MINERVA (N/A Uterus)  Patient Location: PACU  Anesthesia Type:General  Level of Consciousness: awake, alert  and oriented  Airway & Oxygen Therapy: Patient Spontanous Breathing and Patient connected to face mask oxygen  Post-op Assessment: Report given to RN and Post -op Vital signs reviewed and stable  Post vital signs: Reviewed and stable  Last Vitals:  Vitals Value Taken Time  BP 139/93 02/04/2019  1:43 PM  Temp    Pulse 93 02/04/2019  1:44 PM  Resp 15 02/04/2019  1:44 PM  SpO2 100 % 02/04/2019  1:44 PM  Vitals shown include unvalidated device data.  Last Pain:  Vitals:   02/04/19 1223  TempSrc: Oral  PainSc: 0-No pain         Complications: No apparent anesthesia complications

## 2019-02-05 NOTE — Anesthesia Postprocedure Evaluation (Signed)
Anesthesia Post Note  Patient: Tracy Weiss  Procedure(s) Performed: DILATATION AND CURETTAGE/ABLATION WITH MINERVA (N/A Uterus)     Patient location during evaluation: PACU Anesthesia Type: General Level of consciousness: awake and alert Pain management: pain level controlled Vital Signs Assessment: post-procedure vital signs reviewed and stable Respiratory status: spontaneous breathing, nonlabored ventilation, respiratory function stable and patient connected to nasal cannula oxygen Cardiovascular status: blood pressure returned to baseline and stable Postop Assessment: no apparent nausea or vomiting Anesthetic complications: no    Last Vitals:  Vitals:   02/04/19 1430 02/04/19 1513  BP: 121/83 130/87  Pulse: 89 98  Resp: 13 16  Temp:  37.2 C  SpO2: 97% 100%    Last Pain:  Vitals:   02/04/19 1513  TempSrc:   PainSc: 0-No pain                 Effie Berkshire

## 2019-02-06 ENCOUNTER — Encounter (HOSPITAL_BASED_OUTPATIENT_CLINIC_OR_DEPARTMENT_OTHER): Payer: Self-pay | Admitting: Obstetrics & Gynecology

## 2019-02-16 ENCOUNTER — Other Ambulatory Visit: Payer: Self-pay | Admitting: *Deleted

## 2019-02-16 NOTE — Telephone Encounter (Signed)
Pt calls because she had an ablation done and now has a yeast infection.  She states that Dr. Alease Weiss office advised that she call Dr. Tammi Weiss.  Pt states that she gets yeast infections (itchy discharge) easily especially with her blood sugars being so high.  Will forward to MD. Christen Bame, CMA

## 2019-02-16 NOTE — Telephone Encounter (Signed)
Attempted to call pt but no answer or VM set up. See message below. Tracy Weiss Tracy Weiss, CMA

## 2019-02-16 NOTE — Telephone Encounter (Signed)
Please schedule patient for telemed visit  Tracy More, DO, PGY-2 [facility] 02/16/2019 4:01 PM

## 2019-02-17 NOTE — Telephone Encounter (Signed)
Called and LVM for patient to call office and set up a telemed visit with Dr. Tammi Klippel.  .Ozella Almond, [place]

## 2019-02-22 NOTE — Progress Notes (Signed)
[facility] Telemedicine Visit  Patient consented to have virtual visit.  I connected with  Tracy Weiss on 02/23/19 by a video enabled telemedicine application and verified that I am speaking with the correct person using two identifiers. I discussed the limitations of evaluation and management by telemedicine. The patient expressed understanding and agreed to proceed.  Method of visit: Telephone  Encounter participants: Patient: Tracy Weiss - located at Home Provider: Caroline Weiss - located at [facility] Others (if applicable): None  Chief Complaint: Frustration with clinic, EKG results, yeast infection   HPI: EKG Patient reports that she had an EKG recently for a preop clearance and is frustrated because she feels like she needs a cardiology referral.  States that she has had enlargement of her heart seen on both this EKG and previous EKG in 2016 and no one has referred her to a cardiologist.  Mother passed away from a cardiac disorder.  Patient states that her OB told her that her PCP should have referred her to a cardiologist back in 2016. EKG on 6/1 showing normal sinus rhythm, possible left atrial enlargement, no significant change since 2016 (this was confirmed by Dr. Johnsie Cancel when EKG was obtained). QTc 467.  She is very frustrated and would like to see a cardiologist  Yeast infection Patient reports that she is very frustrated because she has had a yeast infection for a week now and states "look at my files it says that I should have treatment for yeast infections without being seen". Itching and skin breaking up. When patient had surgery sugars went up causing her to have a yeast infection.  In reviewing charts patient called on 6/15 stating that she had itchy discharge and Dr. Hulan Fray, OB/GYN, told her to call PCP.  Telephone note from me on 6/15 2 hours after this call stating that patient should be scheduled for tele-med visit, given that we would likely need Weiss history.   Two following messages on 6/15 from red team CMA's asking patient to schedule this visit.  Frustration with clinic  Patient states that she is very frustrated with the care she has had at [facility].  States that she feels ignored.  States that it is in her records that she should get medications without having to be seen.  Was frustrated that she had to have a tele-med visit.    Patient is also frustrated that no one has referred her to cardiology since EKG showed possible LVH in 2016. States that her OB/GYN, Dr. Hulan Fray, informed her that clinic was "negligent" because we did not provide her with cardiology referral in 2016.    Patient also frustrated that she has not had any medication samples and is out of insulin.  In chart review patient did receive sample of Lantus in January 2020 as well as NovoLog sample.  Advised to follow-up in 1 month.  Telehealth visit from 12/2018 with Dr. Mingo Amber says that patient is bringing in paperwork that will allow her to obtain Lantus.  At that time clinic was out of NovoLog samples.  No further notes that patient has dropped off any paperwork.    Patient is also frustrated with financial department of clinic, did mention [facility] in particular.  States that HIPPA was broken as patient's financial information was "told to everyone in the office".  She states that she is planning on hiring a lawyer and suing the clinic as she is frustrated that her financial information was told to everyone in clinic.  ROS: per HPI  Pertinent PMHx: AUB, GAD, yeast infection  Exam:  Respiratory: speaking full sentences, no increased WOB  Assessment/Plan:  Yeast infection Rx for Diflucan sent to patient's desired pharmacy.  Explained to patient that it is not typical clinic policy to give medications without obtaining a history or physical.  During COVID times we may do telephone visits and prescribed medications, however, would be beneficial to obtain a history so I may know what kind of  discharge patient is having. 1 refill of diflucan also provided  Left atrial enlargement Patient with EKG on 6/1 showing left atrial enlargement.  No significant changes since EKG in 2016.  Given family history of cardiac disease will refer patient to cardiology.  Patient was appreciative of this.  Type 2 diabetes mellitus without complication, with long-term current use of insulin ([place]) History of longstanding uncontrolled diabetes.  Has been out of medications as she is unable to get samples from the clinic and does not have insurance at this time.  Discussed importance of seeing clinic sooner rather than later if she is out of medications.  Was able to set up a access to care pool visit for tomorrow morning as morning appointments work best for her.  Patient will likely need samples at that time as she is unable to afford her insulin.  Patient is very appreciative of this appointment because she feels like she needs to be seen soon.  Can consider referring to endocrinology at that time as it appears her diabetes is very difficult to control.   Frustration with clinic Patient reports she is very frustrated with clinic and particularly [facility] in financial.  States that she is planning on hiring a lawyer to sue the clinic.  I discussed these registrations with patient.  Given the extent of her frustration we discussed the possibility of talking to clinic director.  Patient is very appreciative of this.  Would like to speak to Dr. Gwendlyn Deutscher directly.  I also discussed this with attending physician Dr. Josiah Lobo who was preceptor for the day.  Time spent during visit with patient: 35 minutes No charge as patient has scheduled in person visit on 6/23

## 2019-02-23 ENCOUNTER — Other Ambulatory Visit: Payer: Self-pay

## 2019-02-23 ENCOUNTER — Telehealth (INDEPENDENT_AMBULATORY_CARE_PROVIDER_SITE_OTHER): Payer: Self-pay | Admitting: Family Medicine

## 2019-02-23 DIAGNOSIS — E119 Type 2 diabetes mellitus without complications: Secondary | ICD-10-CM

## 2019-02-23 DIAGNOSIS — I517 Cardiomegaly: Secondary | ICD-10-CM | POA: Insufficient documentation

## 2019-02-23 DIAGNOSIS — Z794 Long term (current) use of insulin: Secondary | ICD-10-CM

## 2019-02-23 DIAGNOSIS — B379 Candidiasis, unspecified: Secondary | ICD-10-CM

## 2019-02-23 HISTORY — DX: Cardiomegaly: I51.7

## 2019-02-23 MED ORDER — FLUCONAZOLE 150 MG PO TABS: ORAL_TABLET | ORAL | 1 refills | Status: DC

## 2019-02-23 NOTE — Assessment & Plan Note (Signed)
Patient with EKG on 6/1 showing left atrial enlargement.  No significant changes since EKG in 2016.  Given family history of cardiac disease will refer patient to cardiology.  Patient was appreciative of this.

## 2019-02-23 NOTE — Assessment & Plan Note (Signed)
Rx for Diflucan sent to patient's desired pharmacy.  Explained to patient that it is not typical clinic policy to give medications without obtaining a history or physical.  During COVID times we may do telephone visits and prescribed medications, however, would be beneficial to obtain a history so I may know what kind of discharge patient is having. 1 refill of diflucan also provided

## 2019-02-23 NOTE — Assessment & Plan Note (Signed)
History of longstanding uncontrolled diabetes.  Has been out of medications as she is unable to get samples from the clinic and does not have insurance at this time.  Discussed importance of seeing clinic sooner rather than later if she is out of medications.  Was able to set up a access to care pool visit for tomorrow morning as morning appointments work best for her.  Patient will likely need samples at that time as she is unable to afford her insulin.  Patient is very appreciative of this appointment because she feels like she needs to be seen soon.  Can consider referring to endocrinology at that time as it appears her diabetes is very difficult to control.

## 2019-02-24 ENCOUNTER — Ambulatory Visit (INDEPENDENT_AMBULATORY_CARE_PROVIDER_SITE_OTHER): Payer: Self-pay | Admitting: Family Medicine

## 2019-02-24 ENCOUNTER — Other Ambulatory Visit: Payer: Self-pay

## 2019-02-24 VITALS — BP 105/62 | HR 88 | Wt 180.0 lb

## 2019-02-24 DIAGNOSIS — E119 Type 2 diabetes mellitus without complications: Secondary | ICD-10-CM

## 2019-02-24 DIAGNOSIS — Z794 Long term (current) use of insulin: Secondary | ICD-10-CM

## 2019-02-24 DIAGNOSIS — Z91120 Patient's intentional underdosing of medication regimen due to financial hardship: Secondary | ICD-10-CM

## 2019-02-24 LAB — POCT GLYCOSYLATED HEMOGLOBIN (HGB A1C): HbA1c, POC (controlled diabetic range): 13.1 % — AB (ref 0.0–7.0)

## 2019-02-24 MED ORDER — NOVOLOG FLEXPEN 100 UNIT/ML ~~LOC~~ SOPN: 10.0000 [IU] | PEN_INJECTOR | Freq: Three times a day (TID) | SUBCUTANEOUS | 0 refills | Status: DC

## 2019-02-24 MED ORDER — INSULIN GLARGINE 100 UNIT/ML SOLOSTAR PEN: 30.0000 [IU] | PEN_INJECTOR | Freq: Every day | SUBCUTANEOUS | 0 refills | Status: DC

## 2019-02-24 MED ORDER — METFORMIN HCL 1000 MG PO TABS: 1000.0000 mg | ORAL_TABLET | Freq: Two times a day (BID) | ORAL | 3 refills | Status: DC

## 2019-02-24 NOTE — Patient Instructions (Addendum)
It was a pleasure to see you today! Thank you for choosing [facility] for your primary care. Tracy Weiss was seen for DM management and medication sample. Come back to the clinic to see Dr. Tammi Klippel on 7/2 and Dr. Valentina Lucks on 7/9, and go to the emergency room if you start having symptoms of DKA.   1.  Please take your lantus sample 30u in the morning  2. Please take your novolog 10u (up to three times daily) with meals 3. Please take your metformin 1000mg  twice per day 4. Please check your blood sugar 3x/day before you take your insulin, if your blood sugar goes lower than 120, please call the clinic before taking that dose.   Please bring all your medications to every doctors visit   Sign up for My Chart to have easy access to your labs results, and communication with your Primary care physician.     Please check-out at the front desk before leaving the clinic.     Best,  Dr. Sherene Sires FAMILY MEDICINE RESIDENT - PGY2 02/24/2019 9:28 AM

## 2019-02-25 DIAGNOSIS — Z91120 Patient's intentional underdosing of medication regimen due to financial hardship: Secondary | ICD-10-CM | POA: Insufficient documentation

## 2019-02-25 NOTE — Assessment & Plan Note (Signed)
Patient with complicated history of financial issues including no insurance and inconsistent qualifcation for medication assistance programs.  Has received insulin samples in past and wants to do that long term, we discussed that might not be possible and that she should know [facility] insulin is a much more affordable option if we can't do samples.  Patient insists she will not consider that, has been using sporadic shots of a family member's insulin (a1c now >13)  Has said she dropped of paperwork here to clinic and it was lost  Has mentioned she prefers not to work with our Retail banker due to past conflict and would like another staff member to assist.  Will ask SW and have given patient direct number to MAP program

## 2019-02-25 NOTE — Progress Notes (Signed)
Subjective:  Tracy Weiss is a 53 y.o. female who presents to the [facility] today with a chief complaint of DM and medication assitance.   HPI: Pt intentl undrdose of meds regimen due to financl hardship Patient with complicated history of financial issues including no insurance and inconsistent qualifcation for medication assistance programs.  Has received insulin samples in past and wants to do that long term, we discussed that might not be possible and that she should know [facility] insulin is a much more affordable option if we can't do samples.  Patient insists she will not consider that, has been using sporadic shots of a family member's insulin (a1c now >13)  Has said she dropped of paperwork here to clinic and it was lost  Has mentioned she prefers not to work with our Retail banker due to past conflict and would like another staff member to assist.    Type 2 diabetes mellitus without complication, with long-term current use of insulin ([place]) A1c now >13, patient has been without her samples of insulin for >6 months.  States she occasionally uses a family member's meds, diet has not been regulated at all.  We have told her to consider the potential eventual need for [facility] insulin, given the the number for the MAP program and will have social work contact her (she does not want to speak with our referal coordinator due to past conflict), she has f/u in a week with her pcp    Objective:  Physical Exam: BP 105/62   Pulse 88   Wt 180 lb (81.6 kg)   LMP 06/03/2018   SpO2 97%   BMI 34.01 kg/m   Gen: NAD, resting comfortably CV: regular rate. No jvd Pulm: NWOB, no cough during exam MSK: ambulating without deficit Skin: warm, dry Neuro: grossly normal, moves all extremities Psych: Normal affect and thought content  Results for orders placed or performed in visit on 02/24/19 (from the past 72 hour(s))  HgB A1c     Status: Abnormal   Collection Time: 02/24/19  8:55 AM  Result  Value Ref Range   Hemoglobin A1C     HbA1c POC (<> result, manual entry)     HbA1c, POC (prediabetic range)     HbA1c, POC (controlled diabetic range) 13.1 (A) 0.0 - 7.0 %     Assessment/Plan:  Pt intentl undrdose of meds regimen due to financl hardship Patient with complicated history of financial issues including no insurance and inconsistent qualifcation for medication assistance programs.  Has received insulin samples in past and wants to do that long term, we discussed that might not be possible and that she should know [facility] insulin is a much more affordable option if we can't do samples.  Patient insists she will not consider that, has been using sporadic shots of a family member's insulin (a1c now >13)  Has said she dropped of paperwork here to clinic and it was lost  Has mentioned she prefers not to work with our Retail banker due to past conflict and would like another staff member to assist.  Will ask SW and have given patient direct number to MAP program  Type 2 diabetes mellitus without complication, with long-term current use of insulin ([name]) A1c now >13, patient has been without her samples of insulin for >6 months.  States she occasionally uses a family member's meds, diet has not been regulated at all.  We have told her to consider the potential eventual need for [facility] insulin, given  the the number for the MAP program and will have social work contact her (she does not want to speak with our referal coordinator due to past conflict), she has f/u in a week with her pcp and week after that with pharm  Given samples of novolog and lantus, will take 30u lantus in AM and 10TID novolog.  Will restart metformin (rx'd, no samples).  Will hold insulin doses if cbg below 120.    Tracy Sires, DO FAMILY MEDICINE RESIDENT - PGY2 02/25/2019 9:24 AM

## 2019-02-25 NOTE — Assessment & Plan Note (Signed)
A1c now >13, patient has been without her samples of insulin for >6 months.  States she occasionally uses a family member's meds, diet has not been regulated at all.  We have told her to consider the potential eventual need for [facility] insulin, given the the number for the MAP program and will have social work contact her (she does not want to speak with our referal coordinator due to past conflict), she has f/u in a week with her pcp and week after that with pharm  Given samples of novolog and lantus, will take 30u lantus in AM and 10TID novolog.  Will restart metformin (rx'd, no samples).  Will hold insulin doses if cbg below 120.

## 2019-03-04 NOTE — Progress Notes (Signed)
[facility] Telemedicine Visit  Patient consented to have virtual visit. Method of visit: Telephone  Encounter participants: Patient: Tracy Weiss - located at home - was in a car during part of the visit  Provider: Caroline More - located at [facility] Others (if applicable): None  Chief Complaint: Diabetes   HPI: Diabetes Seen by Dr. Criss Rosales on 6/23 for diabetes.  At that time A1c was above 13.  Patient was noncompliant on medication secondary to financial hardship.  Social work was consulted to help patient with map program.  Was given samples of NovoLog and Lantus and recommended to take Lantus 30 units in the morning and 10 U 3 times daily of NovoLog.  Was instructed to use metformin 1000 mg twice daily as well.  Follow up with Dr. Valentina Lucks in pharmacy clinic in 1 week. Fasting checks: lost machine but will get one today  Post prandial lost machine but will get one today   Compliance: good  Diet: doing pretty good, eating more vegetables, not a lot of meat, no sweets   Exercise: being more active, doing more yard work. Daily  Eye exam: UTD, physician is sending records  Foot exam: will obtain at next in person visit  A1C: 13.1 on 02/24/19 Symptoms: no symptoms of hypoglycemia. no symptoms of  polyuria, polydipsia. no numbness in extremities, and no foot ulcers/trauma Meds: metformin 1000mg  bid, lantus 30U in the am, novolog 10 tid  Pneumonia vaccine: needs at next appointment  Urine micro albumin:creatine ratio: needs at next appointment   Monitoring Labs and Parameters Last A1C:  Lab Results  Component Value Date   HGBA1C 13.1 (A) 02/24/2019   Last Lipid:     Component Value Date/Time   CHOL 192 09/23/2017 1617   HDL 61 09/23/2017 1617   Last Bmet  Potassium  Date Value Ref Range Status  02/02/2019 3.6 3.5 - 5.1 mmol/L Final   Sodium  Date Value Ref Range Status  02/02/2019 132 (L) 135 - 145 mmol/L Final  01/01/2017 138 134 - 144 mmol/L Final   Creat   Date Value Ref Range Status  08/13/2016 0.77 0.50 - 1.10 mg/dL Final   Creatinine, Ser  Date Value Ref Range Status  02/02/2019 0.86 0.44 - 1.00 mg/dL Final      ROS: per HPI  Pertinent PMHx: T2DM with long term insulin use  Exam:  Respiratory: speaking full sentences, no increased WOB  Assessment/Plan:  Type 2 diabetes mellitus without complication, with long-term current use of insulin ([place]) Patient has been compliant on her medication regimen.  Is also improving diet and exercise.  I am hopeful that with diet, exercise, and medication compliance patient's A1c will improve.  Has follow-up with Dr. Valentina Lucks in 1 week for diabetes.  Follow-up with PCP after Dr. Valentina Lucks recommendations.  Patient will keep a journal of her CBG checks.  Advised to take fasting as well as postprandial.  Perform foot exam as well as administer pneumonia vaccine at next visit.  We will also need urine microalbumin creatinine ratio at that visit.   Discussed patient with Dr. Erin Hearing  Time spent during visit with patient: 10 minutes

## 2019-03-05 ENCOUNTER — Other Ambulatory Visit: Payer: Self-pay

## 2019-03-05 ENCOUNTER — Telehealth (INDEPENDENT_AMBULATORY_CARE_PROVIDER_SITE_OTHER): Payer: Self-pay | Admitting: Family Medicine

## 2019-03-05 DIAGNOSIS — E119 Type 2 diabetes mellitus without complications: Secondary | ICD-10-CM

## 2019-03-05 DIAGNOSIS — Z794 Long term (current) use of insulin: Secondary | ICD-10-CM

## 2019-03-05 NOTE — Assessment & Plan Note (Signed)
Patient has been compliant on her medication regimen.  Is also improving diet and exercise.  I am hopeful that with diet, exercise, and medication compliance patient's A1c will improve.  Has follow-up with Dr. Valentina Lucks in 1 week for diabetes.  Follow-up with PCP after Dr. Valentina Lucks recommendations.  Patient will keep a journal of her CBG checks.  Advised to take fasting as well as postprandial.  Perform foot exam as well as administer pneumonia vaccine at next visit.  We will also need urine microalbumin creatinine ratio at that visit.

## 2019-03-09 ENCOUNTER — Telehealth: Payer: Self-pay | Admitting: Family Medicine

## 2019-03-09 NOTE — Telephone Encounter (Signed)
Received update that patient's cardiology referral has been sent to [place] on [address]. Please inform patient she can call that office to see when she can set up an appointment. Phone number is  [phone].   Dalphine Handing, PGY-3 [facility] 03/09/2019 1:25 PM

## 2019-03-09 NOTE — Telephone Encounter (Signed)
Informed patient of referral placed and the number to call [place] for an appointment [phone].  Ozella Almond, [place]

## 2019-03-10 ENCOUNTER — Ambulatory Visit: Payer: Self-pay | Admitting: Registered"

## 2019-03-11 ENCOUNTER — Ambulatory Visit: Payer: Self-pay | Admitting: Registered"

## 2019-03-12 ENCOUNTER — Other Ambulatory Visit: Payer: Self-pay

## 2019-03-12 ENCOUNTER — Encounter: Payer: Self-pay | Admitting: Pharmacist

## 2019-03-12 ENCOUNTER — Ambulatory Visit (INDEPENDENT_AMBULATORY_CARE_PROVIDER_SITE_OTHER): Payer: Self-pay | Admitting: Pharmacist

## 2019-03-12 DIAGNOSIS — E785 Hyperlipidemia, unspecified: Secondary | ICD-10-CM

## 2019-03-12 DIAGNOSIS — Z794 Long term (current) use of insulin: Secondary | ICD-10-CM

## 2019-03-12 DIAGNOSIS — E119 Type 2 diabetes mellitus without complications: Secondary | ICD-10-CM

## 2019-03-12 MED ORDER — OZEMPIC (0.25 OR 0.5 MG/DOSE) 2 MG/1.5ML ~~LOC~~ SOPN: 0.2500 mg | PEN_INJECTOR | SUBCUTANEOUS | 0 refills | Status: DC

## 2019-03-12 MED ORDER — INSULIN GLARGINE 100 UNIT/ML SOLOSTAR PEN: 35.0000 [IU] | PEN_INJECTOR | Freq: Every day | SUBCUTANEOUS | 0 refills | Status: DC

## 2019-03-12 MED ORDER — ROSUVASTATIN CALCIUM 10 MG PO TABS: 10.0000 mg | ORAL_TABLET | Freq: Every day | ORAL | Status: DC

## 2019-03-12 MED ORDER — NOVOLOG FLEXPEN 100 UNIT/ML ~~LOC~~ SOPN: 10.0000 [IU] | PEN_INJECTOR | Freq: Three times a day (TID) | SUBCUTANEOUS | 0 refills | Status: DC

## 2019-03-12 NOTE — Progress Notes (Signed)
    S:     Chief Complaint  Patient presents with  . Medication Management    diabetes    Patient arrives presents to clinic for diabetes evaluation, education, and management Patient was referred and last seen by Primary Care Provider on 02/24/2019.   Patient reports Diabetes was diagnosed as early as 2008.   Insurance coverage/medication affordability: No insurance and reports inability to afford insulin/DM medications.   Patient reports adherence with medications.  Current diabetes medications include: Novolog (insulin aspart), Lantus (insulin glargine), and metformin Current hypertension medications include: Benicar (olmesartan medoxomil-HCTZ) Current hyperlipidemia medications include: none  Patient denies hypoglycemic events.  Patient reported dietary habits: Eats 2-3 meals/day. Trying to increase fruit and vegetable intake.  Patient-reported exercise habits: moderate exercise a few days a week   Patient denies nocturia.  Patient denies neuropathy. Patient reports visual changes. Has seen opthalmologist for visual concerns.   O:  Physical Exam Musculoskeletal:        General: No swelling.  Neurological:     Mental Status: She is alert.  Psychiatric:        Mood and Affect: Mood normal.     Vitals: BP 114/76 (right arm, sitting); Pulse 105, Weight 178; Height 5'2"; SpO2 98%  Review of Systems  Gastrointestinal: Negative.      Lab Results  Component Value Date   HGBA1C 13.1 (A) 02/24/2019   There were no vitals filed for this visit.  Lipid Panel     Component Value Date/Time   CHOL 192 09/23/2017 1617   TRIG 167 (H) 09/23/2017 1617   HDL 61 09/23/2017 1617   CHOLHDL 3.1 09/23/2017 1617   CHOLHDL 2.9 08/13/2016 1424   VLDL 26 08/13/2016 1424   LDLCALC 98 09/23/2017 1617    Patient reports home BG in 300's most of the time.   Clinical ASCVD: No  The 10-year ASCVD risk score Mikey Bussing DC Jr., et al., 2013) is: 3.4%   Values used to calculate the  score:     Age: 53 years     Sex: Female     Is Non-Hispanic African American: Yes     Diabetic: Yes     Tobacco smoker: No     Systolic Blood Pressure: 299 mmHg     Is BP treated: Yes     HDL Cholesterol: 61 mg/dL     Total Cholesterol: 192 mg/dL    A/P: Diabetes longstanding for 12+ years currently uncontrolled. Patient is adherent with medication when she has supply. Control is suboptimal due to medication affordability. -Increased dose of basal insulin Lantus (insulin glargine) from 30 to 35 units every morning. -Continued  rapid insulin Novolog (insulin aspart) at 10 units with meals 2-3x per day.  -Started GLP-1 Ozempic (semaglutide) at .25mg  once weekly.  -Discussed pathophysiology of DM, recommended lifestyle interventions, dietary effects on glycemic control -Counseled on s/sx of and management of hypoglycemia -Next A1C anticipated end of September.   -Continued aspirin 81 mg   Provided samples to patient:  2 Lantus Pens  Lot#: [id number]  Exp: 05/03/2021 2 Novolog Pens Lot#: [id number]  Exp: 11/2019 1 Ozempic Pen Lot#: [id number]  Exp: 12/31/2020  Written patient instructions provided.  Total time in face to face counseling 30 minutes.   Follow up Pharmacist/PCP Dr.Eniola in 4 weeks.   Patient seen with Acie Fredrickson, PharmD Candidate

## 2019-03-12 NOTE — Patient Instructions (Signed)
Nice to see you today.   Please take your medications now that you have an adequate supply.   Lantus dose 35 units once daily in the morning.   Novolog 10 units prior to meals  Start Ozempic 0.25mg  ONCE WEEKlY.   I will plan to follow up by phone in 1 week.  Please schedule an office visit with Dr. Tammi Klippel in 3-4 weeks.

## 2019-03-17 NOTE — Progress Notes (Signed)
Patient ID: Tracy Weiss, female   DOB: [birth year], 53 y.o.   MRN: [id number] Reviewed: Agree with Dr. Graylin Shiver documentation and management.

## 2019-03-24 ENCOUNTER — Telehealth: Payer: Self-pay | Admitting: Pharmacist

## 2019-03-24 ENCOUNTER — Ambulatory Visit: Payer: Self-pay | Admitting: Registered"

## 2019-03-24 NOTE — Telephone Encounter (Signed)
-----   Message from Leavy Cella, [facility] sent at 03/22/2019  2:26 PM EDT ----- Regarding: DM followup

## 2019-03-24 NOTE — Telephone Encounter (Signed)
Patient denies any nausea with 0.5mg  of semaglutide (Ozempic).  Mild constipation but otherwise she is tolerating fine.   Continues on insulin regimen as well.  Denies any low readings - lowest reported 170.   Encouraged continued focus on diet, exercise and adherence with medications.   No change in medication. She was appreciative of the call.   Follow-up in Rx clinic in 2-3 weeks.

## 2019-03-26 ENCOUNTER — Other Ambulatory Visit: Payer: Self-pay

## 2019-03-26 ENCOUNTER — Telehealth: Payer: Self-pay | Admitting: *Deleted

## 2019-03-26 ENCOUNTER — Telehealth (INDEPENDENT_AMBULATORY_CARE_PROVIDER_SITE_OTHER): Payer: Self-pay | Admitting: Family Medicine

## 2019-03-26 DIAGNOSIS — N898 Other specified noninflammatory disorders of vagina: Secondary | ICD-10-CM | POA: Insufficient documentation

## 2019-03-26 MED ORDER — METRONIDAZOLE 0.75 % VA GEL: 1.0000 | Freq: Every day | VAGINAL | 1 refills | Status: AC

## 2019-03-26 NOTE — Assessment & Plan Note (Addendum)
Without symptoms of abnormal vaginal discharge/bleeding or urinary complaints.  Noticed ever since initiation of new diabetic medication.  Symptoms consistent with bacterial vaginosis especially given symptoms are similar to prior BV infections.  Has previously had good response with MetroGel.  Discussed treating with oral metronidazole given she does not currently have insurance, but patient states she has typically only had good relief with gel formulation, so we will send to pharmacy. No concern for STIs, no need for testing at this time. Discussed if symptoms are persistent despite treatment, she should come to the office to be seen and have vaginal swab done.

## 2019-03-26 NOTE — Progress Notes (Signed)
[facility] Telemedicine Visit  Patient consented to have virtual visit. Method of visit: Telephone  Encounter participants: Patient: Tracy Weiss - located at home Provider: Rory Percy - located at [facility] Others (if applicable): n/a  Chief Complaint: foul odor  HPI:  Patient endorses foul smell to vagina ever since starting Ozempic for her diabetes.  She first noticed this the night of 7/21.  She denies any abnormal vaginal discharge or bleeding.  Denies dysuria, urinary frequency or urgency, abdominal pain.  She has previously had bacterial vaginosis whenever she has changes in her diabetic medications and has previously been treated with MetroGel with good effect.  She thinks her symptoms currently are consistent with bacterial vaginosis.  Reports at one point she was on MetroGel twice weekly, likely for prophylaxis, but has not been on in years.  She is not currently sexually active and has no concern for STIs.   Has history of uncontrolled type 2 diabetes with recent A1c 13.1, but has been doing well since initiation of Ozempic.  She reports blood glucose readings 100-200s. Urinating less frequently. Yeast infections less frequently.  Other than foul odor, is tolerating medication well.  ROS: per HPI  Pertinent PMHx: uncontrolled T2DM, PCOS, abnormal uterine bleeding, obesity, GAD, history of yeast infections  Exam:  Respiratory: Speaking in full sentences, no respiratory distress  Assessment/Plan:  Vaginal odor Without symptoms of abnormal vaginal discharge/bleeding or urinary complaints.  Noticed ever since initiation of new diabetic medication.  Symptoms consistent with bacterial vaginosis especially given symptoms are similar to prior BV infections.  Has previously had good response with MetroGel.  Discussed treating with oral metronidazole given she does not currently have insurance, but patient states she has typically only had good relief with gel  formulation, so we will send to pharmacy. No concern for STIs, no need for testing at this time. Discussed if symptoms are persistent despite treatment, she should come to the office to be seen and have vaginal swab done.      Time spent during visit with patient: 10 minutes

## 2019-03-26 NOTE — Telephone Encounter (Signed)
Per Dr. Valentina Lucks, he requested that ATC contact Tracy Weiss due to Tracy Weiss calling pharmacy line last night stating that she was having a urinary problem.  I placed her in the 10:10 slot and tried to call and she did not answer.  LVM to call office back to ask her a few questions before speaking with the doctor.Lennyn Bellanca Zimmerman Rumple, CMA

## 2019-03-26 NOTE — Telephone Encounter (Signed)
ATC doctor was able to reach pt for this visit.Tracy Weiss, CMA

## 2019-04-01 ENCOUNTER — Ambulatory Visit (INDEPENDENT_AMBULATORY_CARE_PROVIDER_SITE_OTHER): Payer: Self-pay | Admitting: Obstetrics & Gynecology

## 2019-04-01 ENCOUNTER — Other Ambulatory Visit: Payer: Self-pay

## 2019-04-01 VITALS — BP 127/86 | HR 89 | Temp 98.3°F | Wt 180.0 lb

## 2019-04-01 DIAGNOSIS — Z1151 Encounter for screening for human papillomavirus (HPV): Secondary | ICD-10-CM

## 2019-04-01 DIAGNOSIS — Z124 Encounter for screening for malignant neoplasm of cervix: Secondary | ICD-10-CM

## 2019-04-01 DIAGNOSIS — Z0000 Encounter for general adult medical examination without abnormal findings: Secondary | ICD-10-CM

## 2019-04-01 NOTE — Progress Notes (Signed)
   Subjective:    Patient ID: Tracy Weiss, female    DOB: March 06, 1966, 53 y.o.   MRN: [id number]  HPI 53 yo single P2 here for a post op visit and a pap smear. She had a d&c and Minerva ablation on 02/04/19. She has no problems. She is having some brown discharge (old blood). She has not had sex since surgery.   Review of Systems     Objective:   Physical Exam Breathing, conversing, and ambulating normally Well nourished, well hydrated Black female, no apparent distress Abd- benign Cervix- normal, old brown blood noted     Assessment & Plan:  Post op- doing well Preventative care- pap smear today

## 2019-04-07 LAB — CYTOLOGY - PAP
Diagnosis: UNDETERMINED — AB
HPV: NOT DETECTED

## 2019-04-13 ENCOUNTER — Telehealth: Payer: Self-pay | Admitting: Pharmacist

## 2019-04-13 NOTE — Telephone Encounter (Signed)
Patient called reporting that she is tolerating Ozempic.  States she thinks she is losing weight.    Anticipate continuing with supply of sample of Ozempic later this week (dose provided today).  Plan to provide them to her when/if they arrive as samples later this week.

## 2019-04-15 ENCOUNTER — Ambulatory Visit: Payer: No Typology Code available for payment source | Admitting: Podiatry

## 2019-04-15 ENCOUNTER — Other Ambulatory Visit: Payer: Self-pay

## 2019-04-16 ENCOUNTER — Telehealth: Payer: Self-pay | Admitting: Pharmacist

## 2019-04-16 NOTE — Telephone Encounter (Signed)
Call by pharmacy student, Murlean Iba, informed patient that Ozemplic samples were ready for pick-up.    2 pens were labeled and placed in refrigerator.   Drug name: Ozempic Pen        Qty: 2 pens  LOT: [id number]  Exp.Date: 07/03/2021  Dosing instructions: 1 injection weekly  The patient has been instructed regarding the correct time, dose, and frequency of taking this medication, including desired effects and most common side effects.   Tracy Weiss 10:57 AM 04/16/2019    Patient plans to pick up later this AM

## 2019-04-16 NOTE — Telephone Encounter (Signed)
-----   Message from Leavy Cella, [facility] sent at 04/13/2019 11:26 AM EDT ----- Regarding: Ozempic

## 2019-04-21 ENCOUNTER — Other Ambulatory Visit: Payer: Self-pay | Admitting: Family Medicine

## 2019-05-07 ENCOUNTER — Telehealth: Payer: Self-pay | Admitting: Pharmacist

## 2019-05-07 NOTE — Telephone Encounter (Signed)
F/U attempted  RE DM control.   Left message to return call to main office number if she needed additional Ozempic supply.    Plan to call again in 2 weeks.

## 2019-05-08 ENCOUNTER — Telehealth: Payer: Self-pay | Admitting: Pharmacist

## 2019-05-08 NOTE — Telephone Encounter (Signed)
Called patient to touch base on medication samples. Patient did not answer, LVM to have her return my call. Per Valentina Lucks, ok to offer Ozempic if patient needs. We do have one Lantus pen left. Will await her call.

## 2019-05-08 NOTE — Telephone Encounter (Signed)
Patient calls back. I left one sample of Lantus and 2 samples of Ozempic in the med room for her. Patient appreciative and stated that's all she needs.   Sample Lantus Lot# [id number] Exp 05/03/2021  Sample Ozempic Lot# [id number] Exp 07/03/2021

## 2019-05-20 ENCOUNTER — Telehealth: Payer: Self-pay | Admitting: Pharmacist

## 2019-05-20 NOTE — Telephone Encounter (Signed)
Contacted to follow-up on diabetes control.   States she is taking all of her medications.   Reports blood sugar readings in the high 100s (150-180 reported) Denies nocturia States blood sugar improvement has improved her vision.  Denies yeast infection.   No change in drug therapy today.   Plan to follow-up in Rx clinic in 4-6 weeks.  She appears to be working through the [place] paperwork.

## 2019-05-22 ENCOUNTER — Telehealth (INDEPENDENT_AMBULATORY_CARE_PROVIDER_SITE_OTHER): Payer: HRSA Program | Admitting: Obstetrics & Gynecology

## 2019-05-22 DIAGNOSIS — N939 Abnormal uterine and vaginal bleeding, unspecified: Secondary | ICD-10-CM

## 2019-05-22 NOTE — Telephone Encounter (Signed)
Requesting a call back from the nurse to get a depo injection. Seeing some spotting.

## 2019-05-25 ENCOUNTER — Ambulatory Visit (INDEPENDENT_AMBULATORY_CARE_PROVIDER_SITE_OTHER): Payer: Self-pay

## 2019-05-25 ENCOUNTER — Other Ambulatory Visit: Payer: Self-pay

## 2019-05-25 VITALS — BP 136/92 | HR 92 | Wt 174.4 lb

## 2019-05-25 DIAGNOSIS — Z3042 Encounter for surveillance of injectable contraceptive: Secondary | ICD-10-CM

## 2019-05-25 DIAGNOSIS — Z3202 Encounter for pregnancy test, result negative: Secondary | ICD-10-CM

## 2019-05-25 LAB — POCT PREGNANCY, URINE: Preg Test, Ur: NEGATIVE

## 2019-05-25 MED ORDER — MEDROXYPROGESTERONE ACETATE 150 MG/ML IM SUSP
150.0000 mg | Freq: Once | INTRAMUSCULAR | Status: AC
  Administered 2019-05-25: 150 mg via INTRAMUSCULAR

## 2019-05-25 NOTE — Progress Notes (Signed)
Tracy Weiss here for Depo-Provera  Injection.  Injection administered without complication. Patient will return in 3 months for next injection.  Pt is 21 weeks late from last Depo Provera from 01/2019.  Pt denies having intercourse since May.  Pregnancy test resulted negative.    Verdell Carmine, RN 05/25/2019  6:12 PM

## 2019-05-25 NOTE — Telephone Encounter (Signed)
Pt called in and is late to getting her Depo shot and she has had some spotting. Discussed she will need to make a nurse appt to get her shot. Message sent to front desk to call pt to schedule. Pt voiced understanding.

## 2019-05-29 ENCOUNTER — Other Ambulatory Visit: Payer: Self-pay

## 2019-06-01 MED ORDER — FLUCONAZOLE 150 MG PO TABS: ORAL_TABLET | ORAL | 0 refills | Status: DC

## 2019-06-19 ENCOUNTER — Telehealth: Payer: Self-pay | Admitting: Pharmacist

## 2019-06-19 NOTE — Telephone Encounter (Signed)
Patient called on 10/14 with concern she had over a news report that Novolog was associated with cancer.   At that time, I was unable to respond quickly as I had not heard of this effect.  I have been unable to find anything related to this report with additional searching.   Called patient to share news that I believe Novolog is safe for continued use. She was unavailable for conversation.     Left HIPAA compliant message re: my inability to verify the news report and asked that she continue current/past treatment plan.  I also expressed that I would be interested to review the source should she be able to share that source.   No follow-up planned at this time.

## 2019-06-22 ENCOUNTER — Other Ambulatory Visit: Payer: Self-pay

## 2019-06-22 DIAGNOSIS — Z20822 Contact with and (suspected) exposure to covid-19: Secondary | ICD-10-CM

## 2019-06-24 LAB — NOVEL CORONAVIRUS, NAA: SARS-CoV-2, NAA: NOT DETECTED

## 2019-07-23 ENCOUNTER — Other Ambulatory Visit: Payer: Self-pay

## 2019-07-23 DIAGNOSIS — Z20822 Contact with and (suspected) exposure to covid-19: Secondary | ICD-10-CM

## 2019-07-24 ENCOUNTER — Encounter: Payer: Self-pay | Admitting: Cardiology

## 2019-07-24 ENCOUNTER — Telehealth (INDEPENDENT_AMBULATORY_CARE_PROVIDER_SITE_OTHER): Payer: Self-pay | Admitting: Cardiology

## 2019-07-24 VITALS — Ht 61.0 in | Wt 187.0 lb

## 2019-07-24 DIAGNOSIS — E785 Hyperlipidemia, unspecified: Secondary | ICD-10-CM

## 2019-07-24 DIAGNOSIS — R072 Precordial pain: Secondary | ICD-10-CM

## 2019-07-24 DIAGNOSIS — R9431 Abnormal electrocardiogram [ECG] [EKG]: Secondary | ICD-10-CM

## 2019-07-24 DIAGNOSIS — Z01812 Encounter for preprocedural laboratory examination: Secondary | ICD-10-CM

## 2019-07-24 DIAGNOSIS — I10 Essential (primary) hypertension: Secondary | ICD-10-CM

## 2019-07-24 DIAGNOSIS — Z7189 Other specified counseling: Secondary | ICD-10-CM

## 2019-07-24 MED ORDER — METOPROLOL TARTRATE 100 MG PO TABS: ORAL_TABLET | ORAL | 0 refills | Status: DC

## 2019-07-24 NOTE — Patient Instructions (Addendum)
Medication Instructions:  Your Physician recommend you continue on your current medication as directed.    *If you need a refill on your cardiac medications before your next appointment, please call your pharmacy*  Lab Work: Your physician recommends that you return for lab work 1 week prior to test [facility])  If you have labs (blood work) drawn today and your tests are completely normal, you will receive your results only by: Marland Kitchen MyChart Message (if you have MyChart) OR . A paper copy in the mail If you have any lab test that is abnormal or we need to change your treatment, we will call you to review the results.  Testing/Procedures: Non-Cardiac CT Angiography (CTA), is a special type of CT scan that uses a computer to produce multi-dimensional views of major blood vessels throughout the body. In CT angiography, a contrast material is injected through an IV to help visualize the blood vessels   Follow-Up: At [facility], you and your health needs are our priority.  As part of our continuing mission to provide you with exceptional heart care, we have created designated Provider Care Teams.  These Care Teams include your primary Cardiologist (physician) and Advanced Practice Providers (APPs -  Physician Assistants and Nurse Practitioners) who all work together to provide you with the care you need, when you need it.  Your next appointment:   3 month(s)  The format for your next appointment:   In Person  Provider:   Buford Dresser, MD  Your cardiac CT will be scheduled at one of the below locations:   [facility] [address] [place], [place] [address] [phone]  If scheduled at [facility], please arrive at the [facility] main entrance of [facility] 30-45 minutes prior to test start time. Proceed to the [facility] Radiology Department (first floor) to check-in and test prep.  If scheduled at [facility], please  arrive 15 mins early for check-in and test prep.  Please follow these instructions carefully (unless otherwise directed).  On the Night Before the Test: . Be sure to Drink plenty of water. . Do not consume any caffeinated/decaffeinated beverages or chocolate 12 hours prior to your test. . Do not take any antihistamines 12 hours prior to your test.  On the Day of the Test: . Drink plenty of water. Do not drink any water within one hour of the test. . Do not eat any food 4 hours prior to the test. . You may take your regular medications prior to the test.  . Take metoprolol (Lopressor) two hours prior to test. . HOLD Furosemide/Hydrochlorothiazide morning of the test. . FEMALES- please wear underwire-free bra if available       After the Test: . Drink plenty of water. . After receiving IV contrast, you may experience a mild flushed feeling. This is normal. . On occasion, you may experience a mild rash up to 24 hours after the test. This is not dangerous. If this occurs, you can take Benadryl 25 mg and increase your fluid intake. . If you experience trouble breathing, this can be serious. If it is severe call 911 IMMEDIATELY. If it is mild, please call our office. . If you take any of these medications: Glipizide/Metformin, Avandament, Glucavance, please do not take 48 hours after completing test unless otherwise instructed.   Once we have confirmed authorization from your insurance company, we will call you to set up a date and time for your test.  For non-scheduling related questions, please contact the cardiac imaging nurse navigator should you have any questions/concerns: Marchia Bond, RN Navigator Cardiac Imaging [facility] [phone] Office

## 2019-07-24 NOTE — Progress Notes (Signed)
Virtual Visit via Telephone Note   This visit type was conducted due to national recommendations for restrictions regarding the COVID-19 Pandemic (e.g. social distancing) in an effort to limit this patient's exposure and mitigate transmission in our community.  Due to her co-morbid illnesses, this patient is at least at moderate risk for complications without adequate follow up.  This format is felt to be most appropriate for this patient at this time.  The patient did not have access to video technology/had technical difficulties with video requiring transitioning to audio format only (telephone).  All issues noted in this document were discussed and addressed.  No physical exam could be performed with this format.  Please refer to the patient's chart for her  consent to telehealth for [facility].   Date:  07/24/2019   ID:  Tracy Weiss, DOB 05-05-1966, MRN [id number]  Patient Location: Home Provider Location: Home  PCP:  Caroline More, DO  Cardiologist:  Buford Dresser, MD  Electrophysiologist:  None   Evaluation Performed:  Consultation - Tracy Weiss was referred by Dr. Sherren Mocha McDiarmid and Dr. Caroline More for the evaluation of left ventricular hypertrophy.  Chief Complaint:  Abnormal ECG, risk, symptoms  History of Present Illness:    Tracy Weiss is a 53 y.o. female with type II diabetes, not on insulin, who is seen as a new patient (self pay) for evaluation of abnormal ECG, cardiovascular risk, and review of symptoms.  The patient does have symptoms concerning for COVID-19 infection (fever, chills, cough, or new shortness of breath). She was ordered for Covid test yesterday, given chest tightness/shortness of breath  Per note 02/23/19 by Dr. Tammi Klippel: "EKG Patient reports that she had an EKG recently for a preop clearance and is frustrated because she feels like she needs a cardiology referral.  States that she has had enlargement of her heart seen on both this EKG and previous  EKG in 2016 and no one has referred her to a cardiologist.  Mother passed away from a cardiac disorder.  Patient states that her OB told her that her PCP should have referred her to a cardiologist back in 2016. EKG on 6/1 showing normal sinus rhythm, possible left atrial enlargement, no significant change since 2016 (this was confirmed by Dr. Johnsie Cancel when EKG was obtained). QTc 467.  She is very frustrated and would like to see a cardiologist"  Cardiovascular risk factors: Prior clinical ASCVD: none Comorbid conditions: mild hypertension, diabetes. Denies chronic kidney disease. Denies hyperlipidemia, on statin due to diabetes risk Metabolic syndrome/Obesity: BMI 35, highest adult weight right now. She is very upset by this. Working on weight loss. Lowest weight was in the 150s in her 40s.  Chronic inflammatory conditions: none Tobacco use history: no significant use history (tried as a teenager) Family history: mother died of cancer--denies any heart issues, dad died of unclear reasons ?heart issues. Mat gma, mat aunt, mat uncle all had heart issues. All had diabetes Prior cardiac testing and/or incidental findings on other testing (ie coronary calcium): none Exercise level: reports that she could be more active. Most active is walking 3 miles 3 days/week. Current diet: working on improving, avoiding sodas.  Hypertension: told her BP doesn't run high, was placed on medication to protect heart and kidneys. Has also taken BP medications intermittently and hasn't noticed that her numbers are elevated. Reports usually in the [id number] systolic.  Lipids: Peak LDL 127. Most recent Tchol 192, HDL 61, LDL 98, TG 167. Has not been statin, discussed  this at length today.  Abnormal ECG: reviewed at length today.  On ROS, endorses night time chest tightness and shortness of breath. Happens mostly at night, sometimes when being active as well. She has difficulty describing location, radiation. Associated with  shortness of breath. Lasts about a minute. Had covid tests for this, reported as negative. Has not had prior evaluation for chest pain.  No PND, orthopnea, LE edema. No syncope or palpitations.  Past Medical History:  Diagnosis Date  . Abnormal uterine bleeding (AUB)   . Abnormal uterine bleeding (AUB)   . ADHD   . Anxiety   . Diabetes mellitus without complication ([place])   . Gallstones 09/2016  . Hypertension    Past Surgical History:  Procedure Laterality Date  . CESAREAN SECTION    . CHOLECYSTECTOMY N/A 09/11/2016   Procedure: LAPAROSCOPIC CHOLECYSTECTOMY WITH INTRAOPERATIVE CHOLANGIOGRAM;  Surgeon: Donnie Mesa, MD;  Location: [facility];  Service: General;  Laterality: N/A;  . DILATATION AND CURETTAGE/HYSTEROSCOPY WITH MINERVA N/A 02/04/2019   Procedure: DILATATION AND CURETTAGE/ABLATION WITH MINERVA;  Surgeon: Emily Filbert, MD;  Location: [place];  Service: Gynecology;  Laterality: N/A;  MINERVA ABLATION  . tubal ligaton reversal  2017     Current Meds  Medication Sig  . ALPRAZolam (XANAX) 0.25 MG tablet Take 0.25 mg by mouth at bedtime as needed for anxiety.  Marland Kitchen aspirin 81 MG chewable tablet Chew by mouth daily.  . cycloSPORINE (RESTASIS) 0.05 % ophthalmic emulsion Place 1 drop into both eyes 2 (two) times daily as needed (dry eyes).  . fluconazole (DIFLUCAN) 150 MG tablet TAKE 1 TABLET BY MOUTH TODAY, AND 1 TABLET IN 72HRS  . insulin aspart (NOVOLOG FLEXPEN) 100 UNIT/ML FlexPen Inject 10 Units into the skin 3 (three) times daily with meals.  . Insulin Glargine (LANTUS) 100 UNIT/ML Solostar Pen Inject 35 Units into the skin daily before breakfast.  . metFORMIN (GLUCOPHAGE) 1000 MG tablet Take 1 tablet (1,000 mg total) by mouth 2 (two) times daily with a meal.  . methylphenidate (RITALIN LA) 30 MG 24 hr capsule Take 30 mg by mouth every morning.  . olmesartan-hydrochlorothiazide (BENICAR HCT) 40-25 MG tablet Take 1 tablet by mouth daily.  . phentermine 37.5 MG capsule  Take 37.5 mg by mouth every morning.  . Prenatal Vit-Fe Fumarate-FA (PRENATAL VITAMIN PO) Take 1 tablet by mouth.  . rosuvastatin (CRESTOR) 10 MG tablet Take 1 tablet (10 mg total) by mouth daily.  . Semaglutide,0.25 or 0.5MG /DOS, (OZEMPIC, 0.25 OR 0.5 MG/DOSE,) 2 MG/1.5ML SOPN Inject 0.25 mg into the skin once a week.     Allergies:   Patient has no known allergies.   Social History   Tobacco Use  . Smoking status: Never Smoker  . Smokeless tobacco: Never Used  Substance Use Topics  . Alcohol use: Yes    Alcohol/week: 0.0 standard drinks    Comment: occassionally  . Drug use: No     Family Hx: The patient's family history includes Cancer in her mother; Diabetes in her father; Heart disease in her father. There is no history of Breast cancer.  ROS:   Please see the history of present illness.    Constitutional: Negative for chills, fever, night sweats, unintentional weight loss  HENT: Negative for ear pain and hearing loss.   Eyes: Negative for loss of vision and eye pain.  Respiratory: Negative for cough, sputum, wheezing.   Cardiovascular: See HPI. Gastrointestinal: Negative for abdominal pain, melena, and hematochezia.  Genitourinary: Negative for  dysuria and hematuria.  Musculoskeletal: Negative for falls and myalgias.  Skin: Negative for itching and rash.  Neurological: Negative for focal weakness, focal sensory changes and loss of consciousness.  Endo/Heme/Allergies: Does not bruise/bleed easily.  All other systems reviewed and are negative.   Prior CV studies:   The following studies were reviewed today:  No prior cardiac studies  Labs/Other Tests and Data Reviewed:    EKG:  An ECG dated 02/02/2019 was personally reviewed today and demonstrated:  NSR, nonspecific changes as per HPI    Recent Labs: 10/07/2018: TSH 0.694 02/02/2019: BUN 8; Creatinine, Ser 0.86; Hemoglobin 12.3; Platelets 358; Potassium 3.6; Sodium 132   Recent Lipid Panel Lab Results  Component  Value Date/Time   CHOL 192 09/23/2017 04:17 PM   TRIG 167 (H) 09/23/2017 04:17 PM   HDL 61 09/23/2017 04:17 PM   CHOLHDL 3.1 09/23/2017 04:17 PM   CHOLHDL 2.9 08/13/2016 02:24 PM   LDLCALC 98 09/23/2017 04:17 PM    Wt Readings from Last 3 Encounters:  07/24/19 187 lb (84.8 kg)  05/25/19 174 lb 6.4 oz (79.1 kg)  04/01/19 180 lb (81.6 kg)     Objective:    Vital Signs:  Ht 5\' 1"  (1.549 m)   Wt 187 lb (84.8 kg)   BMI 35.33 kg/m      ASSESSMENT & PLAN:    Chest tightness, shortness of breath: We spent significant time today reviewing different parts of the cardiovascular system (electrical, vascular, functional, and valvular). We discussed how each of these systems can present with different symptoms. We reviewed that there are different ways we evaluate these symptoms with tests. We reviewed which tests I think are most appropriate given the symptoms, and we discussed risks/benefits and limitations of each of these tests. Please see summary below. We also discussed that if testing is unrevealing for a cardiac cause of the symptoms, there are many noncardiac causes as well that can contribute to symptoms. If the heart is ruled out, then I recommend returning to PCP to discuss alternative diagnoses.  -discussed treadmill stress (not appropriate with diabetes), nuclear stress/lexiscan, and CT coronary angiography. Discussed pros and cons of each, including but not limited to false positive/false negative risk, radiation risk, and risk of IV contrast dye. Based on shared decision making, decision was made to pursue CT coronary angiography. -will give one time dose of metoprolol as prior HR was 92 -counseled on need to get BMET prior to test -counseled on use of sublingual nitroglycerin and its importance to a good test  Hyperlipidemia: -discussed pathology of cholesterol, how plaques form, that MI/CVA result commonly from acute plaque rupture and not gradual stenosis. Discussed mechanism  of statin to both decrease plaque accumulation and stabilize plaque that is already present. Discussed that calcium is a marker for plaque, with decades of validated data regarding average amounts of calcium for age/gender/ethnicity, as well as value of calcium score for risk stratification -we discussed prevention at length, especially with diabetes. She has not been taking statin, she is willing to restart -recheck lipids and LFTs in 3 mos  Abnormal ECG: no high risk features. Getting CT to evaluate chest pain, as above.  CV risk factors and prevention -recommend heart healthy/Mediterranean diet, with whole grains, fruits, vegetable, fish, lean meats, nuts, and olive oil. Limit salt. -recommend moderate walking, 3-5 times/week for 30-50 minutes each session. Aim for at least 150 minutes.week. Goal should be pace of 3 miles/hours, or walking 1.5 miles in 30 minutes -recommend  avoidance of tobacco products. Avoid excess alcohol. -Additional risk factor control:  -Diabetes: A1c is 13.1, working closely with PCP to improve  -Lipids: see above  -Blood pressure control: denies hypertension, though prior BP slightly elevated. Continue current medications  -Weight: BMI 35, working on weight loss  -ASCVD risk score: The 10-year ASCVD risk score Mikey Bussing DC Brooke Bonito., et al., 2013) is: 9.2%   Values used to calculate the score:     Age: 51 years     Sex: Female     Is Non-Hispanic African American: Yes     Diabetic: Yes     Tobacco smoker: No     Systolic Blood Pressure: [id number] mmHg     Is BP treated: Yes     HDL Cholesterol: 61 mg/dL     Total Cholesterol: 192 mg/dL   COVID-19 Education: The signs and symptoms of COVID-19 were discussed with the patient and how to seek care for testing (follow up with PCP or arrange E-visit).  The importance of social distancing was discussed today.  Time:   Today, I have spent 45 minutes with the patient with telehealth technology discussing the above problems.      Medication Adjustments/Labs and Tests Ordered: Current medicines are reviewed at length with the patient today.  Concerns regarding medicines are outlined above.   Tests Ordered: Orders Placed This Encounter  Procedures  . CT CORONARY MORPH W/CTA COR W/SCORE W/CA W/CM &/OR WO/CM  . CT CORONARY FRACTIONAL FLOW RESERVE DATA PREP  . CT CORONARY FRACTIONAL FLOW RESERVE FLUID ANALYSIS  . Basic metabolic panel    Medication Changes: Meds ordered this encounter  Medications  . metoprolol tartrate (LOPRESSOR) 100 MG tablet    Sig: TAKE 1 TABLET 1 HR PRIOR TO CARDIAC PROCEDURE    Dispense:  1 tablet    Refill:  0   Patient Instructions  Medication Instructions:  Your Physician recommend you continue on your current medication as directed.    *If you need a refill on your cardiac medications before your next appointment, please call your pharmacy*  Lab Work: Your physician recommends that you return for lab work 1 week prior to test [facility])  If you have labs (blood work) drawn today and your tests are completely normal, you will receive your results only by: Marland Kitchen MyChart Message (if you have MyChart) OR . A paper copy in the mail If you have any lab test that is abnormal or we need to change your treatment, we will call you to review the results.  Testing/Procedures: Non-Cardiac CT Angiography (CTA), is a special type of CT scan that uses a computer to produce multi-dimensional views of major blood vessels throughout the body. In CT angiography, a contrast material is injected through an IV to help visualize the blood vessels   Follow-Up: At [facility], you and your health needs are our priority.  As part of our continuing mission to provide you with exceptional heart care, we have created designated Provider Care Teams.  These Care Teams include your primary Cardiologist (physician) and Advanced Practice Providers (APPs -  Physician Assistants and Nurse Practitioners) who all work  together to provide you with the care you need, when you need it.  Your next appointment:   3 month(s)  The format for your next appointment:   In Person  Provider:   Buford Dresser, MD  Your cardiac CT will be scheduled at one of the below locations:   [facility] [address]  [place], [place] [address] [phone]  If scheduled at [facility], please arrive at the [facility] main entrance of [facility] 30-45 minutes prior to test start time. Proceed to the [facility] Radiology Department (first floor) to check-in and test prep.  If scheduled at [facility], please arrive 15 mins early for check-in and test prep.  Please follow these instructions carefully (unless otherwise directed).  On the Night Before the Test: . Be sure to Drink plenty of water. . Do not consume any caffeinated/decaffeinated beverages or chocolate 12 hours prior to your test. . Do not take any antihistamines 12 hours prior to your test.  On the Day of the Test: . Drink plenty of water. Do not drink any water within one hour of the test. . Do not eat any food 4 hours prior to the test. . You may take your regular medications prior to the test.  . Take metoprolol (Lopressor) two hours prior to test. . HOLD Furosemide/Hydrochlorothiazide morning of the test. . FEMALES- please wear underwire-free bra if available       After the Test: . Drink plenty of water. . After receiving IV contrast, you may experience a mild flushed feeling. This is normal. . On occasion, you may experience a mild rash up to 24 hours after the test. This is not dangerous. If this occurs, you can take Benadryl 25 mg and increase your fluid intake. . If you experience trouble breathing, this can be serious. If it is severe call 911 IMMEDIATELY. If it is mild, please call our office. . If you take any of these medications: Glipizide/Metformin, Avandament, Glucavance,  please do not take 48 hours after completing test unless otherwise instructed.   Once we have confirmed authorization from your insurance company, we will call you to set up a date and time for your test.   For non-scheduling related questions, please contact the cardiac imaging nurse navigator should you have any questions/concerns: Marchia Bond, RN Navigator Cardiac Imaging [facility] [phone] Office       Follow Up:  3 mos  Signed, Buford Dresser, MD  07/24/2019 11:14 AM    [place]

## 2019-07-25 LAB — NOVEL CORONAVIRUS, NAA: SARS-CoV-2, NAA: NOT DETECTED

## 2019-08-04 ENCOUNTER — Other Ambulatory Visit: Payer: Self-pay | Admitting: Family Medicine

## 2019-08-12 ENCOUNTER — Telehealth: Payer: Self-pay | Admitting: Family Medicine

## 2019-08-12 NOTE — Telephone Encounter (Signed)
Patient called to see if she could get NOVALOG, OZEMPIC from Dr. Valentina Lucks. Please give pt a call back.

## 2019-08-13 NOTE — Telephone Encounter (Signed)
Patient contacted RE diabetes control and medication supply.   She states she only has a few days supply of Novolog and 1 more week of Ozempic dosing.     She states she has lost weight after discontinuing Depo-provera and is sleeping better with less nocturia.  She is not checking blood sugars routinely.   I offered her 2 Ozempic pens and only 1 Fiasp (insulin aspart), explained that this was similar to Novolog.  She plans to pick up later today.   Medication Samples have been provided to the patient.  Drug name: Fiasp (insulin aspart)       Strength: 100units/ml        Qty: 1 pen LOT: [id number] Exp.Date: 04/02/2021 Dosing instructions: same as novolog - 10 units prior to meals   Drug name: Ozempic (semaglutide)       2 pens  LOT: [id number] Exp.Date: 07/03/2021 Dosing instructions: Same dosing - no change at this time.  The patient has been instructed regarding the correct time, dose, and frequency of taking this medication, including desired effects and most common side effects.   I requested she call back prior to running out of medication.   Tracy Weiss 9:12 AM 08/13/2019

## 2019-08-15 ENCOUNTER — Telehealth: Payer: Self-pay | Admitting: Family Medicine

## 2019-08-15 NOTE — Telephone Encounter (Signed)
**  After Hours/ Emergency Line Call**  Received a call to report that Tracy Weiss dropped an iron on her right big toe about 2 weeks ago.  Had nail polish on it and had pain for 3 days.  Took nail polish off today and noticed it was dark.  The toe itself is not red, swollen, or hot.  She is concerned because of her history of diabetes.  No fevers.  Otherwise feeling well.  Has no pain at present.  Recommended follow up in clinic next week, patient requested I make her an appointment for Monday.  Appt make in ATC with Dr. Ky Barban at 3:50 for foot exam.  ED precautions discussed including redness of toe, worsening pain, fever, or swelling.  She voiced understanding.  Red flags discussed.  Will forward to PCP and Dr. Ky Barban.  [place] Constable, DO PGY-2, [facility] 08/15/2019 9:43 AM

## 2019-08-17 ENCOUNTER — Other Ambulatory Visit: Payer: Self-pay

## 2019-08-17 ENCOUNTER — Ambulatory Visit (INDEPENDENT_AMBULATORY_CARE_PROVIDER_SITE_OTHER): Payer: Self-pay | Admitting: Family Medicine

## 2019-08-17 VITALS — BP 114/82 | HR 97 | Wt 176.4 lb

## 2019-08-17 DIAGNOSIS — Z794 Long term (current) use of insulin: Secondary | ICD-10-CM

## 2019-08-17 DIAGNOSIS — S90129A Contusion of unspecified lesser toe(s) without damage to nail, initial encounter: Secondary | ICD-10-CM | POA: Insufficient documentation

## 2019-08-17 DIAGNOSIS — S99929A Unspecified injury of unspecified foot, initial encounter: Secondary | ICD-10-CM

## 2019-08-17 DIAGNOSIS — E119 Type 2 diabetes mellitus without complications: Secondary | ICD-10-CM

## 2019-08-17 DIAGNOSIS — S90111A Contusion of right great toe without damage to nail, initial encounter: Secondary | ICD-10-CM

## 2019-08-17 HISTORY — DX: Unspecified injury of unspecified foot, initial encounter: S99.929A

## 2019-08-17 LAB — POCT GLYCOSYLATED HEMOGLOBIN (HGB A1C): HbA1c, POC (controlled diabetic range): 11.1 % — AB (ref 0.0–7.0)

## 2019-08-17 MED ORDER — OLMESARTAN MEDOXOMIL-HCTZ 40-25 MG PO TABS: 1.0000 | ORAL_TABLET | Freq: Every day | ORAL | 3 refills | Status: DC

## 2019-08-17 NOTE — Assessment & Plan Note (Signed)
S/p iron falling on foot.  Right great toe with bruising noted at base and underneath toenail without hematoma or signs of infection.  Will monitor for now.  Advised to return to clinic if develops redness, swelling, discharge, fevers.

## 2019-08-17 NOTE — Patient Instructions (Addendum)
It was great to see you!  Our plans for today:  - Your toe is bruised which should heal over time, you may notice the bruise growing out with your nail. Bruises can sometimes turn green or yellow as they heal, this is normal.  - If you notice return of the pain or if you notice discharge, bleeding, swelling or redness, come back to be seen.  - Follow up soon with Dr. Tammi Klippel for your diabetes.  Take care and seek immediate care sooner if you develop any concerns.   Dr. [facility]

## 2019-08-17 NOTE — Progress Notes (Signed)
  Subjective:   Patient ID: Tracy Weiss    DOB: [birth year], 53 y.o. female   MRN: [id number]  Tracy Weiss is a 53 y.o. female with a history of HTN, IBS, T2DM, PCOS, ADD, obesity, HLD, GAD here for   Toe pain Reports she dropped an iron on her right big toe about 2 weeks ago with subsequent pain for about 3 days. She then noticed some dark skin discoloration around the base of her nail.  She then removed her nail polish and saw darkened discoloration under her nail.  She has been keeping the area clean and providing compression.  She notes she no longer has pain.  She has not noticed any discharge, redness, swelling.  Review of Systems:  Per HPI.  Medications and smoking status reviewed.  Objective:   BP 114/82   Pulse 97   Wt 176 lb 6.4 oz (80 kg)   SpO2 99%   BMI 33.33 kg/m  Vitals and nursing note reviewed.  General: overweight female, in no acute distress with non-toxic appearance Skin: warm, dry Extremities: warm and well perfused, normal tone.  Right great toe with bruising noted at base and underneath toenail without hematoma.  No swelling, redness, discharge. MSK: ROM grossly intact, gait normal Neuro: Alert and oriented, speech normal  Assessment & Plan:   Toe contusion S/p iron falling on foot.  Right great toe with bruising noted at base and underneath toenail without hematoma or signs of infection.  Will monitor for now.  Advised to return to clinic if develops redness, swelling, discharge, fevers.  Orders Placed This Encounter  Procedures  . HgB A1c   Meds ordered this encounter  Medications  . olmesartan-hydrochlorothiazide (BENICAR HCT) 40-25 MG tablet    Sig: Take 1 tablet by mouth daily.    Dispense:  90 tablet    Refill:  [place], DO PGY-3, [facility] 08/17/2019 8:29 PM

## 2019-08-19 ENCOUNTER — Ambulatory Visit (INDEPENDENT_AMBULATORY_CARE_PROVIDER_SITE_OTHER): Payer: Self-pay | Admitting: *Deleted

## 2019-08-19 ENCOUNTER — Other Ambulatory Visit: Payer: Self-pay

## 2019-08-19 ENCOUNTER — Ambulatory Visit: Payer: Self-pay

## 2019-08-19 DIAGNOSIS — Z23 Encounter for immunization: Secondary | ICD-10-CM

## 2019-08-20 ENCOUNTER — Ambulatory Visit: Payer: Self-pay

## 2019-08-21 ENCOUNTER — Other Ambulatory Visit: Payer: Self-pay | Admitting: Family Medicine

## 2019-08-21 DIAGNOSIS — Z1231 Encounter for screening mammogram for malignant neoplasm of breast: Secondary | ICD-10-CM

## 2019-08-24 ENCOUNTER — Other Ambulatory Visit (HOSPITAL_COMMUNITY): Payer: Self-pay | Admitting: *Deleted

## 2019-08-24 DIAGNOSIS — Z1231 Encounter for screening mammogram for malignant neoplasm of breast: Secondary | ICD-10-CM

## 2019-08-31 ENCOUNTER — Telehealth: Payer: Self-pay | Admitting: Obstetrics & Gynecology

## 2019-08-31 NOTE — Telephone Encounter (Signed)
I called Jazale back to discuss her request. She reports her depo ran out 08/24/19 and she did not get another depo shot since her last one in September or October. States she has been bleeding off and on and has discussed this with Dr. Hulan Fray who said it should stop.  She states she had the D&C and the ablation. Would like Dr.Dove to call her to discuss if she should keep taking the depo-provera , and the megace or what she should do. States she has a good relationship wth Dr.Dove and would like her to call her.  I informed her I will pass this information on to Dr.Dove and I am not sure when she will be called back. She voices understanding. Jamaar Howes,RN

## 2019-08-31 NOTE — Telephone Encounter (Signed)
Patient called and said she didn't plan on getting another Depo but she's been bleeding for two weeks and not sure what her next step is, patient would like a call back

## 2019-09-01 ENCOUNTER — Telehealth: Payer: Self-pay | Admitting: Obstetrics & Gynecology

## 2019-09-01 ENCOUNTER — Ambulatory Visit (INDEPENDENT_AMBULATORY_CARE_PROVIDER_SITE_OTHER): Payer: Self-pay | Admitting: Family Medicine

## 2019-09-01 ENCOUNTER — Encounter: Payer: Self-pay | Admitting: Family Medicine

## 2019-09-01 ENCOUNTER — Other Ambulatory Visit: Payer: Self-pay

## 2019-09-01 VITALS — BP 120/78 | HR 92

## 2019-09-01 DIAGNOSIS — M549 Dorsalgia, unspecified: Secondary | ICD-10-CM | POA: Insufficient documentation

## 2019-09-01 DIAGNOSIS — M545 Low back pain, unspecified: Secondary | ICD-10-CM

## 2019-09-01 DIAGNOSIS — Z794 Long term (current) use of insulin: Secondary | ICD-10-CM

## 2019-09-01 DIAGNOSIS — E119 Type 2 diabetes mellitus without complications: Secondary | ICD-10-CM

## 2019-09-01 LAB — POCT UA - MICROSCOPIC ONLY

## 2019-09-01 LAB — POCT URINALYSIS DIP (MANUAL ENTRY)
Bilirubin, UA: NEGATIVE
Glucose, UA: 500 mg/dL — AB
Ketones, POC UA: NEGATIVE mg/dL
Leukocytes, UA: NEGATIVE
Nitrite, UA: NEGATIVE
Protein Ur, POC: NEGATIVE mg/dL
Spec Grav, UA: 1.02 (ref 1.010–1.025)
Urobilinogen, UA: 0.2 E.U./dL
pH, UA: 6.5 (ref 5.0–8.0)

## 2019-09-01 MED ORDER — OZEMPIC (0.25 OR 0.5 MG/DOSE) 2 MG/1.5ML ~~LOC~~ SOPN: 0.2500 mg | PEN_INJECTOR | SUBCUTANEOUS | 0 refills | Status: DC

## 2019-09-01 MED ORDER — INSULIN GLARGINE 100 UNIT/ML SOLOSTAR PEN: 35.0000 [IU] | PEN_INJECTOR | Freq: Every day | SUBCUTANEOUS | 0 refills | Status: DC

## 2019-09-01 MED ORDER — MEDROXYPROGESTERONE ACETATE 10 MG PO TABS: 10.0000 mg | ORAL_TABLET | Freq: Every day | ORAL | 12 refills | Status: DC

## 2019-09-01 NOTE — Telephone Encounter (Signed)
53 yo lady calling with light bleeding, but almost daily. She has not had depo provera since 10/20. She has taken megace for week but is still having light bleeding. She had a d&c and Minerva ablation 6/20. The bleeding is much less since then but is still present and very annoying. She had a BTL. I suggested that she take 80mg  megace BID versus change to provera versus hysterectomy. She is not interested in surgery at this time. She would like to try provera. I have ordered this 10 days per month for every month. She will stop the megace.

## 2019-09-01 NOTE — Progress Notes (Signed)
   Subjective:    Patient ID: Tracy Weiss, female    DOB: 1966/03/10, 53 y.o.   MRN: [id number]   CC: kidney problems  HPI: Back pain Patient presenting today for concerns of kidney problem.  Reports that she started to have low back pain x1 week.  States it was bilateral and felt "on fire".  Talked to a friend who said that is where her kidneys are so she needs to get these checked.  States that she does not have any pain today but prior to this she did have significant pain.  Pain was constant.  Reports having increased water intake which improved the pain.  Denies any dysuria, frequency, urgency, urinary color change, urinary odor change.  Does have some pelvic pain but she thinks this is related to her recent vaginal spotting which she sees gynecology for.  She recently started Provera for this.  Objective:  BP 120/78   Pulse 92   SpO2 96%  Vitals and nursing note reviewed  General: well nourished, in no acute distress HEENT: normocephalic Neck: supple  Cardiac: RRR, clear S1 and S2, no murmurs, rubs, or gallops Respiratory: clear to auscultation bilaterally, no increased work of breathing Abdomen: soft, nontender, nondistended, no masses or organomegaly. Bowel sounds present Extremities: no edema or cyanosis. Warm, well perfused.  Back: No spinal tenderness, does report some paraspinal muscular pain when lifting her Weiss up in the air Skin: warm and dry, no rashes noted Neuro: alert and oriented, no focal deficits   Assessment & Plan:    Back pain Patient presenting with low back pain.  X1 week.  Will not obtain imaging at this time given the length of course.  Can consider musculoskeletal etiology.  Advised to use heating pads and muscle rub cream as needed.  Can consider renal etiology however no urinary symptoms.  Will obtain UA as well as urine microalbumin creatinine ratio and BMP to rule this out.  UA was showing glucose as well as RBCs however patient is currently spotting  which explains RBCs.  If work-up is negative can consider musculoskeletal etiology.  Advised to follow-up in 1 month if no improvement, sooner if worsening.  Advised to follow-up immediately for the emergency department if she develops fevers with back pain.  Type 2 diabetes mellitus without complication, with long-term current use of insulin (HCC) Samples of Ozempic and Lantus given to patient during this encounter.    No follow-ups on file.   Caroline More, DO, PGY-3

## 2019-09-01 NOTE — Assessment & Plan Note (Signed)
Samples of Ozempic and Lantus given to patient during this encounter.

## 2019-09-01 NOTE — Assessment & Plan Note (Signed)
Patient presenting with low back pain.  X1 week.  Will not obtain imaging at this time given the length of course.  Can consider musculoskeletal etiology.  Advised to use heating pads and muscle rub cream as needed.  Can consider renal etiology however no urinary symptoms.  Will obtain UA as well as urine microalbumin creatinine ratio and BMP to rule this out.  UA was showing glucose as well as RBCs however patient is currently spotting which explains RBCs.  If work-up is negative can consider musculoskeletal etiology.  Advised to follow-up in 1 month if no improvement, sooner if worsening.  Advised to follow-up immediately for the emergency department if she develops fevers with back pain.

## 2019-09-01 NOTE — Patient Instructions (Signed)
It was a pleasure seeing you today.   Today we discussed your back pain  For your back pain: Continue to stay well-hydrated.  I would continue to use a heating pad or a muscle rub for your back.  I will obtain blood work to check your kidney levels.  We will also check your urine to make sure your kidneys are doing well.  Please follow-up in 1 month if no improvement.  Follow-up sooner if worsening pain.  For your diabetes medications please call back at the beginning of next week to see if we have any more samples.  Please follow up in 1 month for back pain if no improvement or sooner if symptoms persist or worsen. Please call the clinic immediately if you have any concerns.   Our clinic's number is [phone]. Please call with questions or concerns.   Please go to the emergency room if you have blood in your urine or fevers with back pain   Thank you,  Caroline More, DO

## 2019-09-02 ENCOUNTER — Telehealth: Payer: Self-pay | Admitting: *Deleted

## 2019-09-02 LAB — BASIC METABOLIC PANEL
BUN/Creatinine Ratio: 12 (ref 9–23)
BUN: 10 mg/dL (ref 6–24)
CO2: 23 mmol/L (ref 20–29)
Calcium: 9.6 mg/dL (ref 8.7–10.2)
Chloride: 101 mmol/L (ref 96–106)
Creatinine, Ser: 0.82 mg/dL (ref 0.57–1.00)
GFR calc Af Amer: 96 mL/min/{1.73_m2} (ref >59)
GFR calc non Af Amer: 83 mL/min/{1.73_m2} (ref >59)
Glucose: 348 mg/dL — ABNORMAL HIGH (ref 65–99)
Potassium: 4.1 mmol/L (ref 3.5–5.2)
Sodium: 135 mmol/L (ref 134–144)

## 2019-09-02 NOTE — Telephone Encounter (Signed)
Pt informed. Kaisa Wofford, CMA  

## 2019-09-02 NOTE — Telephone Encounter (Signed)
-----   Message from Caroline More, DO sent at 09/02/2019  8:25 AM EST ----- Please inform patient that results of BMP are negative. Glucose was elevated but this could be related to eating before BMP or 2/2 her history of diabetes

## 2019-09-04 LAB — SPECIMEN STATUS REPORT

## 2019-09-08 ENCOUNTER — Ambulatory Visit: Payer: Self-pay | Attending: Internal Medicine

## 2019-09-08 DIAGNOSIS — Z20822 Contact with and (suspected) exposure to covid-19: Secondary | ICD-10-CM | POA: Insufficient documentation

## 2019-09-08 LAB — MICROALBUMIN / CREATININE URINE RATIO
Creatinine, Urine: 104.8 mg/dL
Microalb/Creat Ratio: 14 mg/g creat (ref 0–29)
Microalbumin, Urine: 14.6 ug/mL

## 2019-09-10 LAB — NOVEL CORONAVIRUS, NAA: SARS-CoV-2, NAA: NOT DETECTED

## 2019-09-17 ENCOUNTER — Encounter (HOSPITAL_COMMUNITY): Payer: Self-pay

## 2019-09-17 ENCOUNTER — Ambulatory Visit
Admission: RE | Admit: 2019-09-17 | Discharge: 2019-09-17 | Disposition: A | Discharge status: Home / Self Care | Payer: No Typology Code available for payment source | Source: Ambulatory Visit | Attending: Obstetrics and Gynecology | Admitting: Obstetrics and Gynecology

## 2019-09-17 ENCOUNTER — Ambulatory Visit (HOSPITAL_COMMUNITY)
Admission: RE | Admit: 2019-09-17 | Discharge: 2019-09-17 | Disposition: A | Discharge status: Home / Self Care | Payer: Self-pay | Source: Ambulatory Visit | Attending: Obstetrics and Gynecology | Admitting: Obstetrics and Gynecology

## 2019-09-17 ENCOUNTER — Other Ambulatory Visit: Payer: Self-pay

## 2019-09-17 DIAGNOSIS — Z1231 Encounter for screening mammogram for malignant neoplasm of breast: Secondary | ICD-10-CM

## 2019-09-17 DIAGNOSIS — Z0000 Encounter for general adult medical examination without abnormal findings: Secondary | ICD-10-CM | POA: Insufficient documentation

## 2019-09-17 DIAGNOSIS — Z1239 Encounter for other screening for malignant neoplasm of breast: Secondary | ICD-10-CM

## 2019-09-17 NOTE — Patient Instructions (Signed)
Explained breast self awareness with Tracy Weiss. Patient did not need a Pap smear today due to last Pap smear was 04/01/2019. Let her know that her next Pap smear is due the end of July 2021 due to her last Pap smear result per ASCCP guidelines. Referred patient to the [facility] for a screening mammogram. Appointment scheduled for Thursday, September 17, 2019 at 1510. Patient aware of appointment and will be there. Let patient know the [facility] will follow up with her within the next couple weeks with results of mammogram by letter or phone. Tracy Weiss verbalized understanding.  Tracy Weiss, Arvil Chaco, RN 2:05 PM

## 2019-09-17 NOTE — Progress Notes (Signed)
No complaints today.   Pap Smear: Pap smear not completed today. Last Pap smear was 04/01/2019 at [facility] for [place] and ASCUS with negative HPV. Per patient her last Pap smear is the only abnormal Pap smear she has had. Last two Pap smear results are in Epic.  Physical exam: Breasts Breasts symmetrical. No skin abnormalities bilateral breasts. No nipple retraction bilateral breasts. No nipple discharge bilateral breasts. No lymphadenopathy. No lumps palpated bilateral breasts. No complaints of pain or tenderness on exam. Referred patient to the [facility] for a screening mammogram. Appointment scheduled for Thursday, September 17, 2019 at 1510.        Pelvic/Bimanual No Pap smear completed today since last Pap smear and HPV typing was 04/01/2019. Pap smear not indicated per BCCCP guidelines.   Smoking History: Patient has never smoked.  Patient Navigation: Patient education provided. Access to services provided for patient through [facility] program.   Colorectal Cancer Screening: Per patient has never had a colonoscopy completed. No complaints today.   Breast and Cervical Cancer Risk Assessment: Patient has a family history of her mother having breast cancer. Patient has no known genetic mutations or history of radiation treatment to the chest before age 56. Patient has no history of cervical dysplasia, immunocompromised, or DES exposure in-utero.  Risk Assessment    Risk Scores      09/17/2019 09/04/2018   Last edited by: Demetrius Revel, LPN Brayn Eckstein, Heath Gold, RN   5-year risk: 1.9 % 1.8 %   Lifetime risk: 13.2 % 13.5 %

## 2019-09-21 ENCOUNTER — Other Ambulatory Visit: Payer: Self-pay | Admitting: *Deleted

## 2019-09-21 MED ORDER — OLMESARTAN MEDOXOMIL-HCTZ 40-25 MG PO TABS: 1.0000 | ORAL_TABLET | Freq: Every day | ORAL | 3 refills | Status: DC

## 2019-09-22 ENCOUNTER — Encounter: Payer: Self-pay | Admitting: Family Medicine

## 2019-09-23 ENCOUNTER — Telehealth: Payer: Self-pay | Admitting: Pharmacist

## 2019-09-23 NOTE — Telephone Encounter (Signed)
Patient inquires about availability of rapid acting insulin.  States she has adequate supply of Lantus.   I agreed to supply FIASP (insulin Aspart)  Medication Samples have been provided to the patient.  Drug name: Fiasp (insulin aspart)       Strength: 100units/ml        Qty: 2 pens  LOT: [id number]  Exp.Date: 07/03/2021  The patient has been instructed regarding the correct time, dose, and frequency of taking this medication, including desired effects and most common side effects.   Janeann Forehand 10:43 AM 09/23/2019  Placed in refrigerator for pick-up.  Patient planning to pick up at lunch break today (12:30).

## 2019-10-07 ENCOUNTER — Ambulatory Visit (INDEPENDENT_AMBULATORY_CARE_PROVIDER_SITE_OTHER): Payer: Self-pay | Admitting: Family Medicine

## 2019-10-07 ENCOUNTER — Other Ambulatory Visit: Payer: Self-pay

## 2019-10-07 DIAGNOSIS — R32 Unspecified urinary incontinence: Secondary | ICD-10-CM

## 2019-10-07 LAB — POCT URINALYSIS DIP (MANUAL ENTRY)
Bilirubin, UA: NEGATIVE
Blood, UA: NEGATIVE
Glucose, UA: NEGATIVE mg/dL
Ketones, POC UA: NEGATIVE mg/dL
Leukocytes, UA: NEGATIVE
Nitrite, UA: NEGATIVE
Protein Ur, POC: 30 mg/dL — AB
Spec Grav, UA: 1.025 (ref 1.010–1.025)
Urobilinogen, UA: 1 E.U./dL
pH, UA: 6.5 (ref 5.0–8.0)

## 2019-10-07 NOTE — Patient Instructions (Signed)
It was a pleasure seeing you today.   Today we discussed your bladder incontinence  For your incontinence: your urine showed some protein but no infection. I have referred you to a urologist. They should call you to set up an appointment.   Please follow up in 1 month if no improvement or sooner if symptoms persist or worsen. Please call the clinic immediately if you have any concerns.   Our clinic's number is [phone]. Please call with questions or concerns.   Thank you,  Caroline More, DO

## 2019-10-07 NOTE — Progress Notes (Signed)
   CHIEF COMPLAINT / HPI: Incontinence Patient presents with concerns of incontinence.  States that this started 2 weeks ago.  States she started waking up and noticed that she urinated herself at night.  Does occur during the daytime as well.  Started to wear a pad throughout the day and notices urine on it.  Denies any odor change to the urine.  States that she does not feel the urge to urinate but does notice that she is wet as if she urinated on herself.  Denies any urinary leakage with sneezing or coughing.  Denies any urinary urgency or frequency.  Denies any fevers.  Denies any vaginal discharge.  States this is never happened to her before.  Has only had cesarean deliveries, no vaginal births.  Only abdominal surgeries with an ablation and D&C which was done by her OB/GYN in July.  No recent medication changes.  Does report a history of constipation but is taking MiraLAX for this still has regular bowel movements.  PERTINENT  PMH / PSH: HTN, T2DM with insulin use, HLD   OBJECTIVE: LMP 09/08/2019 (Exact Date)   Gen: awake and alert, laying in bed, NAD Cardio: RRR, no MRG Resp: CTAB, no wheezes, rales, or rhonchi Abd: soft, non tender, non distended, bowel sounds present Ext: no edema Female genitalia: normal external genitalia, vulva, vagina, cervix, uterus and adnexa, no prolopse noted. Chaperone present   ASSESSMENT / PLAN:  Incontinence Patient with urinary incontinence.  Unclear etiology at this time.  Does not have symptoms of urge or stress incontinence.  Can consider neurogenic bladder especially given her history of poorly controlled diabetes.  Physical exam was within normal limits without any bladder prolapse noted.  UA within normal limits.  Given concern for possible neurogenic bladder have referred patient to urology.  Strict return precautions given.  Follow-up if no improvement.     Caroline More, DO  PGY-3 [place]

## 2019-10-08 DIAGNOSIS — R32 Unspecified urinary incontinence: Secondary | ICD-10-CM | POA: Insufficient documentation

## 2019-10-08 HISTORY — DX: Unspecified urinary incontinence: R32

## 2019-10-08 NOTE — Assessment & Plan Note (Signed)
Patient with urinary incontinence.  Unclear etiology at this time.  Does not have symptoms of urge or stress incontinence.  Can consider neurogenic bladder especially given her history of poorly controlled diabetes.  Physical exam was within normal limits without any bladder prolapse noted.  UA within normal limits.  Given concern for possible neurogenic bladder have referred patient to urology.  Strict return precautions given.  Follow-up if no improvement.

## 2019-10-09 ENCOUNTER — Ambulatory Visit: Payer: Self-pay

## 2019-10-20 ENCOUNTER — Encounter: Payer: Self-pay | Admitting: Family Medicine

## 2019-10-26 ENCOUNTER — Ambulatory Visit: Payer: Self-pay | Admitting: Cardiology

## 2019-10-26 ENCOUNTER — Encounter: Payer: Self-pay | Admitting: Family Medicine

## 2019-10-29 ENCOUNTER — Encounter: Payer: Self-pay | Admitting: Family Medicine

## 2019-10-29 ENCOUNTER — Other Ambulatory Visit: Payer: Self-pay | Admitting: Family Medicine

## 2019-10-29 MED ORDER — METHYLPHENIDATE HCL ER (LA) 30 MG PO CP24: 30.0000 mg | ORAL_CAPSULE | ORAL | 0 refills | Status: DC

## 2019-10-29 NOTE — Progress Notes (Signed)
Patient requesting refill for ritalin XR and IR for training she is undergoing this week. Per chart review, takes XR prn. Reviewed PMDP, no red flags. Sent Rx for XR formulation to pharmacy. Per chart review and PDMP, has not been prescribed additional short acting ritalin as far back as 2 years so did not prescribe. Recommended patient make in person appointment with PCP to discuss requested changes/adjustment to medication (see separate MyChart message).  Rory Percy, DO PGY-3, [facility] 10/29/2019 7:34 PM

## 2019-11-05 NOTE — Progress Notes (Signed)
    SUBJECTIVE:   CHIEF COMPLAINT / HPI:   ADHD Patient presenting today for follow-up of ADHD.  Patient states she is in a new training course to hopefully obtain employment and she needs to focus in this class.  She normally only takes her ADHD medications when she is working a stressful job or in a class.  She started this class last month.  Noticed that her focus is decreased.  Focus is decreased both in class and in outside of class life.  Patient is currently on Ritalin and has been on this since her undergrad/when she was diagnosed.  She reports she did research on Vyvanse and would like to switch to this.  Her friends are also on Vyvanse and states that it does well for them.  She found a program that will pay for the Vyvanse as well.  Denies aggression but states sometimes she gets frustrated when she is not able to focus in class.  She is hopeful that she will do well in this class because those to do well anticipate to get a job.  Per previous PCP notes, patient has known diagnosis of ADHD and taking Ritalin 30mg  q AM. In 2018 was recommended for Ritalin LA 30 mg in AM + Ritalin IR 10mg  at lunch time. PDMP reviewed, but has not been getting Ritalin IR since at least 2019. Last Ritalin LA on 12/04/18. Telephone visit with Dr. Mingo Amber on 12/03/18 at which time he refilled 30 mg methylphenidate.   Patient reports that she did well on the regimen prescribed by Dr. Avon Gully of Ritalin LA 30 mg in a.m. plus Ritalin IR 10 mg at lunchtime.  Reports Dr. Ky Barban prescribe her Ritalin LA 30 mg but did not prescribe her Ritalin IR 10 mg at lunchtime recently.  PERTINENT  PMH / PSH: ADHD, GAD  OBJECTIVE:   BP 126/80   Pulse 92   SpO2 98%   Gen: awake and alert Cardio: RRR, no MRG Resp: CTAB Ext: no edema  ASSESSMENT/PLAN:   Attention deficit disorder Patient with ADHD that was previously stable on Ritalin.  Patient would like to switch to Vyvanse.  Given change in medications and decreased  focus on her Ritalin I advised that she have reevaluation.  Options for psychiatry versus [place] attention specialist offered to patient.  Given that patient does not have insurance at this time [place] attention specialist may be too expensive.  Patient has opted to see psychiatry.  Referral placed.  In the interim patient will use the 30 mg Ritalin prescribed by Dr. Ky Barban and I will prescribe Ritalin IR 10 mg to use at lunchtime as this was her previous regimen by Dr. Avon Gully. PDMP reviewed and last refill was on 12/05/2018.  Patient to follow-up after she sees psychiatry.  Healthcare maintenance Referral to GI for colonoscopy placed  Type 2 diabetes mellitus without complication, with long-term current use of insulin ([place]) Patient will schedule appointment for diabetic eye exam.  Previous appointment had to be canceled as a complected with her class schedule.  Patient will contact Dr. Valentina Lucks for further samples of diabetic medications      Tracy More, DO  PGY-3 [place]

## 2019-11-06 ENCOUNTER — Other Ambulatory Visit: Payer: Self-pay

## 2019-11-06 ENCOUNTER — Ambulatory Visit (INDEPENDENT_AMBULATORY_CARE_PROVIDER_SITE_OTHER): Payer: Self-pay | Admitting: Family Medicine

## 2019-11-06 VITALS — BP 126/80 | HR 92

## 2019-11-06 DIAGNOSIS — E119 Type 2 diabetes mellitus without complications: Secondary | ICD-10-CM

## 2019-11-06 DIAGNOSIS — Z1211 Encounter for screening for malignant neoplasm of colon: Secondary | ICD-10-CM

## 2019-11-06 DIAGNOSIS — Z794 Long term (current) use of insulin: Secondary | ICD-10-CM

## 2019-11-06 DIAGNOSIS — Z0000 Encounter for general adult medical examination without abnormal findings: Secondary | ICD-10-CM

## 2019-11-06 DIAGNOSIS — F908 Attention-deficit hyperactivity disorder, other type: Secondary | ICD-10-CM

## 2019-11-06 MED ORDER — METHYLPHENIDATE HCL 10 MG PO TABS: 10.0000 mg | ORAL_TABLET | Freq: Every day | ORAL | 0 refills | Status: DC

## 2019-11-06 NOTE — Assessment & Plan Note (Signed)
Patient will schedule appointment for diabetic eye exam.  Previous appointment had to be canceled as a complected with her class schedule.  Patient will contact Dr. Valentina Lucks for further samples of diabetic medications

## 2019-11-06 NOTE — Assessment & Plan Note (Signed)
Referral to GI for colonoscopy placed

## 2019-11-06 NOTE — Assessment & Plan Note (Signed)
Patient with ADHD that was previously stable on Ritalin.  Patient would like to switch to Vyvanse.  Given change in medications and decreased focus on her Ritalin I advised that she have reevaluation.  Options for psychiatry versus [facility] attention specialist offered to patient.  Given that patient does not have insurance at this time [place] attention specialist may be too expensive.  Patient has opted to see psychiatry.  Referral placed.  In the interim patient will use the 30 mg Ritalin prescribed by Dr. Ky Barban and I will prescribe Ritalin IR 10 mg to use at lunchtime as this was her previous regimen by Dr. Avon Gully. PDMP reviewed and last refill was on 12/05/2018.  Patient to follow-up after she sees psychiatry.

## 2019-11-06 NOTE — Patient Instructions (Signed)
Attention Deficit Hyperactivity Disorder, Adult Attention deficit hyperactivity disorder (ADHD) is a mental health disorder that starts during childhood (neurodevelopmental disorder). For many people with ADHD, the disorder continues into the adult years. Treatment can help you manage your symptoms. What are the causes? The exact cause of ADHD is not known. Most experts believe genetics and environmental factors contribute to ADHD. What increases the risk? The following factors may make you more likely to develop this condition:  Having a family history of ADHD.  Being female.  Being born to a mother who smoked or drank alcohol during pregnancy.  Being exposed to lead or other toxins in the womb or early in life.  Being born before 37 weeks of pregnancy (prematurely) or at a low birth weight.  Having experienced a brain injury. What are the signs or symptoms? Symptoms of this condition depend on the type of ADHD. The two main types are inattentive and hyperactive-impulsive. Some people may have symptoms of both types. Symptoms of the inattentive type include:  Difficulty paying attention.  Making careless mistakes.  Not following instructions.  Being disorganized.  Avoiding tasks that require time and attention.  Losing and forgetting things.  Being easily distracted. Symptoms of the hyperactive-impulsive type include:  Restlessness.  Talking too much.  Interrupting.  Difficulty with: ? Sitting still. ? Feeling motivated. ? Relaxing. ? Waiting in line or waiting for a turn. In adults, this condition may lead to certain problems, such as:  Keeping jobs.  Performing tasks at work.  Having stable relationships.  Being on time or keeping to a schedule. How is this diagnosed? This condition is diagnosed based on your current symptoms and your history of symptoms. The diagnosis can be made by a health care provider such as a primary care provider or a mental health  care specialist. Your health care provider may use a symptom checklist or a behavior rating scale to evaluate your symptoms. He or she may also want to talk with people who have observed your behaviors throughout your life. How is this treated? This condition can be treated with medicines and behavior therapy. Medicines may be the best option to reduce impulsive behaviors and improve attention. Your health care provider may recommend:  Stimulant medicines. These are the most common medicines used for adult ADHD. They affect certain chemicals in the brain (neurotransmitters) and improve your ability to control your symptoms.  A non-stimulant medicine for adult ADHD (atomoxetine). This medicine increases a neurotransmitter called norepinephrine. It may take weeks to months to see effects from this medicine. Counseling and behavioral management are also important for treating ADHD. Counseling is often used along with medicine. Your health care provider may suggest:  Cognitive behavioral therapy (CBT). This type of therapy teaches you to replace negative thoughts and actions with positive thoughts and actions. When used as part of ADHD treatment, this therapy may also include: ? Coping strategies for organization, time management, impulse control, and stress reduction. ? Mindfulness and meditation training.  Behavioral management. You may work with a coach who is specially trained to help people with ADHD manage and organize activities and function more effectively. Follow these instructions at home: Medicines   Take over-the-counter and prescription medicines only as told by your health care provider.  Talk with your health care provider about the possible side effects of your medicines and how to manage them. Lifestyle   Do not use drugs.  Do not drink alcohol if: ? Your health care provider tells you   not to drink. ? You are pregnant, may be pregnant, or are planning to become  pregnant.  If you drink alcohol: ? Limit how much you use to:  0-1 drink a day for women.  0-2 drinks a day for men. ? Be aware of how much alcohol is in your drink. In the [place] one drink equals one 12 oz bottle of beer (355 mL), one 5 oz glass of wine (148 mL), or one 1 oz glass of hard liquor (44 mL).  Get enough sleep.  Eat a healthy diet.  Exercise regularly. Exercise can help to reduce stress and anxiety. General instructions  Learn as much as you can about adult ADHD, and work closely with your health care providers to find the treatments that work best for you.  Follow the same schedule each day.  Use reminder devices like notes, calendars, and phone apps to stay on time and organized.  Keep all follow-up visits as told by your health care provider and therapist. This is important. Where to find more information A health care provider may be able to recommend resources that are available online or over the phone. You could start with:  Attention Deficit Disorder Association (ADDA): www.add.org  National Institute of Mental Health (NIMH): www.nimh.nih.gov Contact a health care provider if:  Your symptoms continue to cause problems.  You have side effects from your medicine, such as: ? Repeated muscle twitches, coughing, or speech outbursts. ? Sleep problems. ? Loss of appetite. ? Dizziness. ? Unusually fast heartbeat. ? Stomach pains. ? Headaches.  You are struggling with anxiety, depression, or substance abuse. Get help right away if you:  Have a severe reaction to a medicine. If you ever feel like you may hurt yourself or others, or have thoughts about taking your own life, get help right away. You can go to the nearest emergency department or call:  Your local emergency services (911 in the [place]  A suicide crisis helpline, such as the National Suicide Prevention Lifeline at 1-800-273-8255. This is open 24 hours a day. Summary  ADHD is a mental health  disorder that starts during childhood (neurodevelopmental disorder) and often continues into the adult years.  The exact cause of ADHD is not known. Most experts believe genetics and environmental factors contribute to ADHD.  There is no cure for ADHD, but treatment with medicine, cognitive behavioral therapy, or behavioral management can help you manage your condition. This information is not intended to replace advice given to you by your health care provider. Make sure you discuss any questions you have with your health care provider. Document Revised: 01/12/2019 Document Reviewed: 01/12/2019 Elsevier Patient Education  2020 Elsevier Inc.  

## 2019-11-09 ENCOUNTER — Telehealth: Payer: Self-pay | Admitting: Pharmacist

## 2019-11-09 ENCOUNTER — Encounter: Payer: Self-pay | Admitting: Gastroenterology

## 2019-11-09 NOTE — Telephone Encounter (Signed)
Noted and agree. 

## 2019-11-09 NOTE — Telephone Encounter (Signed)
Patient encouraged that she might have insurance in the near future.   States she believes she is losing weight.   Medication Samples have been provided to the patient.  Drug name: Ozempic               Qty: 1 pen  LOT:  [id number]  Exp.Date: 10/03/2021  Dosing instructions: 0.25mg  daily   Drug name:  Fiasp (insulin aspart)       Strength: 100units/ml        Qty: 2 pens  LOT: [id number]  Exp.Date: 07/03/2021  Dosing instructions: Continue same doses as previous.    The patient has been instructed regarding the correct time, dose, and frequency of taking this medication, including desired effects and most common side effects.   Janeann Forehand 2:13 PM 11/09/2019

## 2019-11-20 ENCOUNTER — Telehealth: Payer: Self-pay | Admitting: Family Medicine

## 2019-11-20 NOTE — Telephone Encounter (Signed)
Call returned to pt and she stated that she has already had 2 pelvic exams by other doctors and wants to discuss her condition virtually with Dr. Kennon Rounds before coming in to the office for face to face visit. Pt does not see the need for another pelvic exam and wants to have [name] so that she can get "right to the problem".  I advised pt that we will change her appt on 3/29 to virtual. I also advised that it is possible that Dr. Kennon Rounds will feel the need to perform pelvic exam herself or some other type of test prior to ultrasound. Pt voiced understanding of all information and agreed to plan of care.

## 2019-11-20 NOTE — Telephone Encounter (Signed)
Patient called in stating she would like to know what Dr. Virginia Crews plans are for her appointment on 3/29 because she does not want to come to the office if they are just going to discuss a plan. She would rather do a virtual appointment in this case. Patient instructed that a message would be sent to the nurses and they will reach out to her as soon as they can. Patient verbalized understanding. Message sent to clinical pool.

## 2019-11-26 ENCOUNTER — Encounter: Payer: Self-pay | Admitting: Psychiatry

## 2019-11-26 ENCOUNTER — Ambulatory Visit (INDEPENDENT_AMBULATORY_CARE_PROVIDER_SITE_OTHER): Payer: No Typology Code available for payment source | Admitting: Psychiatry

## 2019-11-26 ENCOUNTER — Other Ambulatory Visit: Payer: Self-pay

## 2019-11-26 DIAGNOSIS — F900 Attention-deficit hyperactivity disorder, predominantly inattentive type: Secondary | ICD-10-CM

## 2019-11-26 MED ORDER — LISDEXAMFETAMINE DIMESYLATE 50 MG PO CAPS: 50.0000 mg | ORAL_CAPSULE | ORAL | 0 refills | Status: DC

## 2019-11-26 NOTE — Progress Notes (Signed)
Psychiatric Initial Adult Assessment   I connected with  [name] on 11/26/19 by a video enabled telemedicine application and verified that I am speaking with the correct person using two identifiers.   I discussed the limitations of evaluation and management by telemedicine. The patient expressed understanding and agreed to proceed.    Patient Identification: Tracy Weiss MRN:  [id number] Date of Evaluation:  11/26/2019   [facility]  Chief Complaint:   " I really want to talk to you about Vyvanse."  Visit Diagnosis:    ICD-10-CM   1. Attention deficit hyperactivity disorder (ADHD), predominantly inattentive type  F90.0 lisdexamfetamine (VYVANSE) 50 MG capsule    History of Present Illness: This is a 54 year old female with history of ADHD, type 2 diabetes mellitus being managed on insulin seen for psychiatric evaluation.  Patient reported that she has always struggled with concentration and distractibility since a very young age.  She was never formally diagnosed with ADHD until in her 44s.  She reported that she underwent psychological testing a few years ago where diagnosis of ADHD was confirmed.  Writer tried to find the report in the EMR but was unable to.  Patient stated that she went to college and was in [place] and struggled back in 2015 and was started on Ritalin at that time.  As time progressed she has used Ritalin mainly when she is in classes and not on a routine basis.  She stated that she has been taking Ritalin LA 30 mg every morning and Ritalin 10 mg every afternoon however it seems like this combination is not helping her at all anymore.  She stated that she is struggling with concentration and cannot accomplish any of her tasks.  She is having a very hard time retaining things and feels very helpless at times. A [employer] form was filled out by her school supervisor recently however writer was unable to access it in the EMR. She informed  that she did get prescribed Wellbutrin in her late 42s which helped partially but not completely.  She stated that she never misuses any of her medications.  She denied any history of illicit substance use or excessive alcohol consumption. She denied any history of cardiac issues.  Note from cardiology was reviewed.  Patient stated that she has heard a lot about Vyvanse from a friend and also some other sources.  She has done her research and she really would like to try it.  She also stated that she would also like to try it because of its added effect on weight due to appetite suppression.  She asked if she could try a dose and then go from there.  She denied any symptom suggestive of depression, hypomania or mania.  She denied any symptoms of psychosis or PTSD.  She informed that she was prescribed Xanax for as needed use for anxiety.  She stated that she uses it once in a blue moon.  Her last use was about 2 months ago.  She denied any difficulty with sleep.  She is currently working as a Merchandiser, retail in Occupational psychologist.  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  She wants to be a productive member of society and really wants to do well at work.  Patient was offered Vyvanse 50 mg every morning dose to start with and was recommended to follow-up in a month to see how she is doing. Potential side effects  of medication and risks vs benefits of treatment vs non-treatment were explained and discussed. All questions were answered.  Past Psychiatric History: ADHD  Previous Psychotropic Medications: Yes   Substance Abuse History in the last 12 months:  No.  Consequences of Substance Abuse: NA  Past Medical History:  Past Medical History:  Diagnosis Date  . Abnormal uterine bleeding (AUB)   . Abnormal uterine bleeding (AUB)   . ADHD   . Anxiety   . Diabetes mellitus without complication ([place])   . Gallstones 09/2016  . Hypertension      Past Surgical History:  Procedure Laterality Date  . CESAREAN SECTION    . CHOLECYSTECTOMY N/A 09/11/2016   Procedure: LAPAROSCOPIC CHOLECYSTECTOMY WITH INTRAOPERATIVE CHOLANGIOGRAM;  Surgeon: Donnie Mesa, MD;  Location: [facility];  Service: General;  Laterality: N/A;  . DILATATION AND CURETTAGE/HYSTEROSCOPY WITH MINERVA N/A 02/04/2019   Procedure: DILATATION AND CURETTAGE/ABLATION WITH MINERVA;  Surgeon: Emily Filbert, MD;  Location: [facility];  Service: Gynecology;  Laterality: N/A;  MINERVA ABLATION  . tubal ligaton reversal  2017    Family Psychiatric History: There is family history of ADHD and brothers, daughters and also grandson  Family History:  Family History  Problem Relation Age of Onset  . Cancer Mother        Metastatic with Unknown Primary; Stomach was involved  . Diabetes Father   . Heart disease Father   . Breast cancer Neg Hx     Social History:   Social History   Socioeconomic History  . Marital status: Single    Spouse name: Not on file  . Number of children: Not on file  . Years of education: Not on file  . Highest education level: Master's degree (e.g., MA, MS, MEng, MEd, MSW, MBA)  Occupational History  . Not on file  Tobacco Use  . Smoking status: Never Smoker  . Smokeless tobacco: Never Used  Substance and Sexual Activity  . Alcohol use: Yes    Alcohol/week: 0.0 standard drinks    Comment: occassionally  . Drug use: No  . Sexual activity: Not Currently    Birth control/protection: None  Other Topics Concern  . Not on file  Social History Narrative  . Not on file   Social Determinants of Health   Financial Resource Strain:   . Difficulty of Paying Living Expenses:   Food Insecurity:   . Worried About Charity fundraiser in the Last Year:   . Arboriculturist in the Last Year:   Transportation Needs: No Transportation Needs  . Lack of Transportation (Medical): No  . Lack of Transportation (Non-Medical): No  Physical  Activity:   . Days of Exercise per Week:   . Minutes of Exercise per Session:   Stress:   . Feeling of Stress :   Social Connections:   . Frequency of Communication with Friends and Family:   . Frequency of Social Gatherings with Friends and Family:   . Attends Religious Services:   . Active Member of Clubs or Organizations:   . Attends Archivist Meetings:   Marland Kitchen Marital Status:     Additional Social History: see HPI  Allergies:  No Known Allergies  Metabolic Disorder Labs: Lab Results  Component Value Date   HGBA1C 11.1 (A) 08/17/2019   Lab Results  Component Value Date   PROLACTIN 11.5 10/07/2018   PROLACTIN 10.3 06/11/2012   Lab Results  Component Value Date   CHOL 192 09/23/2017  TRIG 167 (H) 09/23/2017   HDL 61 09/23/2017   CHOLHDL 3.1 09/23/2017   VLDL 26 08/13/2016   LDLCALC 98 09/23/2017   LDLCALC 117 (H) 08/13/2016   Lab Results  Component Value Date   TSH 0.694 10/07/2018    Therapeutic Level Labs: No results found for: LITHIUM No results found for: CBMZ No results found for: VALPROATE  Current Medications: Current Outpatient Medications  Medication Sig Dispense Refill  . ALPRAZolam (XANAX) 0.25 MG tablet Take 0.25 mg by mouth at bedtime as needed for anxiety.    Marland Kitchen aspirin 81 MG chewable tablet Chew by mouth daily.    . cycloSPORINE (RESTASIS) 0.05 % ophthalmic emulsion Place 1 drop into both eyes 2 (two) times daily as needed (dry eyes).    . fluconazole (DIFLUCAN) 150 MG tablet TAKE 1 TABLET BY MOUTH TODAY, AND 1 TABLET IN 72HRS 2 tablet 1  . insulin aspart (FIASP FLEXTOUCH) 100 UNIT/ML FlexTouch Pen Inject 10 Units into the skin 3 (three) times daily before meals.    . insulin aspart (NOVOLOG FLEXPEN) 100 UNIT/ML FlexPen Inject 10 Units into the skin 3 (three) times daily with meals. (Patient not taking: Reported on 11/09/2019) 2 pen 0  . Insulin Glargine (LANTUS) 100 UNIT/ML Solostar Pen Inject 35 Units into the skin daily before  breakfast. 6 mL 0  . lisdexamfetamine (VYVANSE) 50 MG capsule Take 1 capsule (50 mg total) by mouth every morning. 30 capsule 0  . medroxyPROGESTERone (PROVERA) 10 MG tablet Take 1 tablet (10 mg total) by mouth daily. Use for ten days 10 tablet 12  . metFORMIN (GLUCOPHAGE) 1000 MG tablet Take 1 tablet (1,000 mg total) by mouth 2 (two) times daily with a meal. 180 tablet 3  . metoprolol tartrate (LOPRESSOR) 100 MG tablet TAKE 1 TABLET 1 HR PRIOR TO CARDIAC PROCEDURE 1 tablet 0  . olmesartan-hydrochlorothiazide (BENICAR HCT) 40-25 MG tablet Take 1 tablet by mouth daily. 90 tablet 3  . Prenatal Vit-Fe Fumarate-FA (PRENATAL VITAMIN PO) Take 1 tablet by mouth.    . rosuvastatin (CRESTOR) 10 MG tablet Take 1 tablet (10 mg total) by mouth daily. 90 tablet   . Semaglutide,0.25 or 0.5MG /DOS, (OZEMPIC, 0.25 OR 0.5 MG/DOSE,) 2 MG/1.5ML SOPN Inject 0.25 mg into the skin once a week. 1 pen 0   No current facility-administered medications for this visit.    Musculoskeletal: Strength & Muscle Tone: unable to assess due to telemed visit Gait & Station: unable to assess due to telemed visit Patient leans: unable to assess due to telemed visit  Psychiatric Specialty Exam: Review of Systems  There were no vitals taken for this visit.There is no height or weight on file to calculate BMI.  General Appearance: Well Groomed  Eye Contact:  Good  Speech:  Normal Rate  Volume:  Normal  Mood:  Euthymic  Affect:  Appropriate and Congruent  Thought Process:  Goal Directed and Descriptions of Associations: Intact  Orientation:  Full (Time, Place, and Person)  Thought Content:  Logical  Suicidal Thoughts:  No  Homicidal Thoughts:  No  Memory:  Immediate;   Good Recent;   Good  Judgement:  Fair  Insight:  Fair  Psychomotor Activity:  Normal  Concentration:  Concentration: Good and Attention Span: Good  Recall:  Good  Fund of Knowledge:Good  Language: Good  Akathisia:  Negative  Handed:  Right  AIMS (if  indicated):  not done  Assets:  Communication Skills Desire for Improvement Financial Resources/Insurance [place] [facility]  Vocational/Educational  ADL's:  Intact  Cognition: WNL  Sleep:  Good   Screenings: PHQ2-9     Office Visit from 11/06/2019 in [facility] Office Visit from 08/17/2019 in [facility] Office Visit from 02/24/2019 in [facility] Office Visit from 10/07/2018 in [facility] for [facility] Office Visit from 01/21/2018 in [facility]  PHQ-2 Total Score  2  0  0  2  0  PHQ-9 Total Score  --  --  --  11  --      Assessment and Plan: Based on her assessment patient meets her here for ADHD, inattentive type.  Patient has taken Ritalin LA and Ritalin in the past with suboptimal effect.  Patient wants to try Vyvanse.  Plan is to start Vyvanse 50 mg every morning and titrate the dose according to her response.  1. Attention deficit hyperactivity disorder (ADHD), predominantly inattentive type  - lisdexamfetamine (VYVANSE) 50 MG capsule; Take 1 capsule (50 mg total) by mouth every morning.  Dispense: 30 capsule; Refill: 0  F/up in 4 weeks.  Nevada Crane, MD 3/25/202110:34 AM

## 2019-11-27 ENCOUNTER — Telehealth: Payer: Self-pay | Admitting: Pharmacist

## 2019-11-27 MED ORDER — NOVOLIN 70/30 (70-30) 100 UNIT/ML ~~LOC~~ SUSP: SUBCUTANEOUS | Status: DC

## 2019-11-27 NOTE — Telephone Encounter (Signed)
Patient called and reported that she did not get hired into a new job.   Patient has received a "FREE supply" of 70/30 insulin from a reliable source.  She desired to use this insulin   We discussed conversion from her current regimen of Lantus 30 units and Novolog 10 units with meals.   I advised her to use 70/30 insulin 30 units in the AM prior to breakfast AND 15 units prior to supper.   We reviewed differences in peaks and action time.  We also reviewed signs and symptoms of hypoglycemia.  She was advised to contact us for concerning symptoms or numbers.

## 2019-11-30 ENCOUNTER — Encounter: Payer: Self-pay | Admitting: Family Medicine

## 2019-11-30 ENCOUNTER — Telehealth (INDEPENDENT_AMBULATORY_CARE_PROVIDER_SITE_OTHER): Payer: BLUE CROSS/BLUE SHIELD | Admitting: Family Medicine

## 2019-11-30 DIAGNOSIS — R32 Unspecified urinary incontinence: Secondary | ICD-10-CM | POA: Diagnosis not present

## 2019-11-30 DIAGNOSIS — N939 Abnormal uterine and vaginal bleeding, unspecified: Secondary | ICD-10-CM

## 2019-11-30 NOTE — Progress Notes (Signed)
I connected with  [name] on 11/30/19 at [phone] by telephone and verified that I am speaking with the correct person using two identifiers.   I discussed the limitations, risks, security and privacy concerns of performing an evaluation and management service by telephone and the availability of in person appointments. I also discussed with the patient that there may be a patient responsible charge related to this service. The patient expressed understanding and agreed to proceed.   Annabell Howells, RN 11/30/2019  8:37 AM

## 2019-11-30 NOTE — Assessment & Plan Note (Signed)
Improved down to needing a panty liner--continue Provera, and await menopause.

## 2019-11-30 NOTE — Progress Notes (Signed)
TELEHEALTH VIRTUAL GYNECOLOGY VISIT ENCOUNTER NOTE  I connected with Tracy Weiss on 11/30/19 at  8:55 AM EDT by telephone at home and verified that I am speaking with the correct person using two identifiers.   I discussed the limitations, risks, security and privacy concerns of performing an evaluation and management service by telephone and the availability of in person appointments. I also discussed with the patient that there may be a patient responsible charge related to this service. The patient expressed understanding and agreed to proceed.   History:  Tracy Weiss is a 54 y.o. G1P0 female being evaluated today for still having a light stream of bleeding. Has had several vaginal exams and felt she was leaking. Saw Urology, no leaking from her bladder. Still having some leaking. Not with the blood. Appears they discussed bladder training and going to the bathroom q 4 hours. Not blood.Has no vaginal irritation, itching. Just clear fluid in small volumes.she is s/p Minerva in 6/20. Should not be related to this. She denies any abnormal vaginal discharge, bleeding, pelvic pain or other concerns.       Past Medical History:  Diagnosis Date  . Abnormal uterine bleeding (AUB)   . Abnormal uterine bleeding (AUB)   . ADHD   . Anxiety   . Diabetes mellitus without complication ([facility])   . Gallstones 09/2016  . Hypertension    Past Surgical History:  Procedure Laterality Date  . CESAREAN SECTION    . CHOLECYSTECTOMY N/A 09/11/2016   Procedure: LAPAROSCOPIC CHOLECYSTECTOMY WITH INTRAOPERATIVE CHOLANGIOGRAM;  Surgeon: Tracy Mesa, MD;  Location: [facility];  Service: General;  Laterality: N/A;  . DILATATION AND CURETTAGE/HYSTEROSCOPY WITH MINERVA N/A 02/04/2019   Procedure: DILATATION AND CURETTAGE/ABLATION WITH MINERVA;  Surgeon: Tracy Filbert, MD;  Location: [facility];  Service: Gynecology;  Laterality: N/A;  MINERVA ABLATION  . tubal ligaton reversal  2017   The following portions of  the patient's history were reviewed and updated as appropriate: allergies, current medications, past family history, past medical history, past social history, past surgical history and problem list.   Health Maintenance:  ASCUS pap and negative HRHPV on 03/2019.  Normal mammogram on 09/19/2019.   Review of Systems:  Pertinent items noted in HPI and remainder of comprehensive ROS otherwise negative.  Physical Exam:   General:  Alert, oriented and cooperative.   Mental Status: Normal mood and affect perceived. Normal judgment and thought content.  Physical exam deferred due to nature of the encounter   Assessment and Plan:     Problem List Items Addressed This Visit      Unprioritized   Abnormal uterine bleeding    Improved down to needing a panty liner--continue Provera, and await menopause.      Incontinence    Is this urine, need Urology records and exam by me to see what sort of d/c this is. Could consider r/o vesicovaginal fistula. And needs cultures done--none have been done thus far.              I discussed the assessment and treatment plan with the patient. The patient was provided an opportunity to ask questions and all were answered. The patient agreed with the plan and demonstrated an understanding of the instructions.   The patient was advised to call back or seek an in-person evaluation/go to the ED if the symptoms worsen or if the condition fails to improve as anticipated.  I provided 15 minutes of non-face-to-face time during this encounter.  Donnamae Jude, MD [facility], [place]

## 2019-11-30 NOTE — Telephone Encounter (Signed)
Noted and agree. 

## 2019-11-30 NOTE — Assessment & Plan Note (Signed)
Is this urine, need Urology records and exam by me to see what sort of d/c this is. Could consider r/o vesicovaginal fistula. And needs cultures done--none have been done thus far.

## 2019-12-03 ENCOUNTER — Other Ambulatory Visit (HOSPITAL_COMMUNITY)
Admission: RE | Admit: 2019-12-03 | Discharge: 2019-12-03 | Disposition: A | Discharge status: Home / Self Care | Payer: BLUE CROSS/BLUE SHIELD | Source: Ambulatory Visit | Attending: Family Medicine | Admitting: Family Medicine

## 2019-12-03 ENCOUNTER — Other Ambulatory Visit: Payer: Self-pay

## 2019-12-03 ENCOUNTER — Ambulatory Visit (INDEPENDENT_AMBULATORY_CARE_PROVIDER_SITE_OTHER): Payer: Self-pay | Admitting: Family Medicine

## 2019-12-03 ENCOUNTER — Encounter: Payer: Self-pay | Admitting: Family Medicine

## 2019-12-03 ENCOUNTER — Ambulatory Visit (AMBULATORY_SURGERY_CENTER): Payer: Self-pay | Admitting: *Deleted

## 2019-12-03 VITALS — BP 125/81 | HR 96 | Wt 181.9 lb

## 2019-12-03 VITALS — Ht 61.5 in | Wt 178.0 lb

## 2019-12-03 DIAGNOSIS — N898 Other specified noninflammatory disorders of vagina: Secondary | ICD-10-CM

## 2019-12-03 DIAGNOSIS — N939 Abnormal uterine and vaginal bleeding, unspecified: Secondary | ICD-10-CM

## 2019-12-03 DIAGNOSIS — Z1211 Encounter for screening for malignant neoplasm of colon: Secondary | ICD-10-CM

## 2019-12-03 NOTE — Patient Instructions (Signed)
[place] Food Resources  Department of Social Services[place] [address], [place], [place] [address] [phone]) [phone]   or  www.[place].gov/our-county/human-services/social-services **SNAP/EBT/ Other nutritional benefits  [place] DHHS-Public Health-WIC [address], [place], [place] [address] [phone]) [phone]  or  https://[place]nc.gov/our-county/human-services/health-department **WIC for  women who are pregnant and postpartum, infants and children up to 5 years old  [facility] [address], [place], [place] [address] [phone]) [phone]   or   www.theblessedtable.org  **Food pantry  [facility] [address], [place], [place] [address] [phone]   or   https://brotherkolbes.godaddysites.com  **Emergency food and prepared meals  [facility] [address], [place], [place] [address] [phone]) [phone]   or   www.cedargrovetop.us **Food pantry  [facility] [address], [place], [place] [address] [phone]) [phone]   or   www.facebook.com/[facility]-116430931718202 **Food pantry  [facility] [address], [place], [place] [address] [phone]) [phone] **Food pantry  [facility] [address], [place], [place] [address] [phone]) [phone]   or   www.greensborourbanministry.org  **Food pantry and prepared meals  [facility] [address], [place], [place] [address] https://jfsgreensboro.org/  **Food pantry  [facility] [address], [place], [place] [address] [phone]) [phone]   or   www.lbcnow.org  **Food pantry  [facility] [address], [place], [place] [address] [phone]) [phone]   or   http://www.onestepfurther.com **Food pantry, nutrition education, gardening activities  [facility] [address], [place], [place] [address] [phone]) [phone] **Food pantry  [facility] [place] [address], [place], [place] [address] [phone]) [phone]   or   www.salvationarmyofgreensboro.org **Food pantry  [facility] [place] [address], [place], [place] [address] [phone]) [phone]   or   http://senior-resources-[place].org **Meals on Wheels Program  [facility] [address], [place], [place] [address] [phone]) [phone]   or   www.stmattchurch.com  **Food pantry  [facility] [address], , [place] [address] [phone]   or   vandaliapresbyterianchurch.org **Food pantry  [place] [facility]  [facility] [address], [place], [place] [address] [phone]) [phone]   or   www.facebook.com/[facility]ianUMC Food pantry  [facility] [address], [place], [place] [address] [phone]) [phone] **Food pantry  [place] [facility] Food Resources   Department of Public Health[place] [address], [place], [place] [address] [phone]) [phone]   or   www.co.[place].us/ph/  [place] DHHS-Public Health-WIC ([place]) [address], [place], [place] [address] [phone]) [phone]   or   https://www.[place]nc.gov/our-county/human-services/health-department **WIC for pregnant and postpartum women, infants and children up to 5 years old  [facility] [address], [place], [place] [address] [phone]) [phone] **Food pantry  [facility] [address], [place], [place] [address] [phone]) [phone]   or   emerywoodbaptistchurch.com *Food pantry  [facility] [address], [place], [place] [address] [phone]) [phone]   or   www.fcch[place].org **Food pantry  [facility] [address], [place],  [address] [phone]) [phone]   or    www.helpinghandshp.org **Food pantry  [facility] [address], [place],  [address] [phone]) [phone]   or   www.facebook.com/KBCI1 **Food Pantry  [facility] [address], [place], [place] [address] [phone]) [phone]   or   www.abbottscreek.org **Food pantry  [facility] [place] [phone])[phone]   **Delivers meals  [facility] [address], [place], [place] [address] [phone]) [phone]   or   nbfgm.sundaystreamwebsites.com  **Food pantry  [facility] [place] [address], [place], [place] [address] [phone]) [phone]   or   www.odm-hp.org  **Food pantry  [facility] [address], [place], [place] [address] [phone]) [phone]   or   R2live.tv **Food pantry  [facility] [address], [place], [place] [address] [phone]) [phone]   or   www.salvationarmycarolinas.org/[place] **Emergency food and pet food  [facility] [address], [place], [place] [address] [phone])[phone]   or   www.senioradults.org **Congregate and delivered meals to older adults  [facility] [place] [address], [place], [place] [address] [phone]) [phone]   or   www.unitedwayhp.org **Back Pack Program for elementary school students  [facility] [address], [place], [place] [address] [phone]) [phone]   or   www.wardstreetcommunityresources.org **Food pantry  [facility] [address], [place], [place] [address] [phone]) [phone]   or   www.ywcahp.com **Emergency food, nutrition classes, food budgeting  [facility] Resources [place]  Department of Social [place]  [address], [place], [place] [address] [phone]) [phone]  or   www.co.[place].Portal.us/pview.aspx?id=14850&catid=407 **SNAP/Other nutrition benefits  [place] Department of Health and Human Services [address], [place], [place] [address] [phone]   or  https://www.rockinghamcountydhhs.org/  **SNAP/Other nutrition benefits  [place] Department of Public Health-WIC & Nutrition Services [address], [place], [place] [address] [phone]) [phone]  or  http://www.[place]countypublichealth.org **WIC for pregnant and postpartum women, infants and children up to 5 years old  Aging, Disability and Transit Services[place] [address], [place], [place] [address] [phone]) [phone]  or www.adtsrc.org **Prepared meals for older adults  [facility] [address], [place], [place] [address] [phone]) [phone]  or  www.calvary4you.com **Food pantry  [facility] [address], [name], [place] [address] [phone]) [phone]   or   https://www.handsofgod.org/  **Food pantry  [facility] [address], [place], [place] [address] [phone]) [phone] **Food pantry  [facility] [address] [place] [address], [place], [place] [address] [phone]) [phone]   or   www.ci.[place].us/government/parks_and_recreation/senior_center/index.php **Congregate meal for older adults  [facility] [address], [place],  [address] [phone]) [phone]   or www.reidsvilleoutreachcenter.org  **Food pantry  [facility] [address], [place],  [address] [phone]) [phone]   or   www.salvationarmycarolinas.org/commands/[place] **Food pantry    

## 2019-12-03 NOTE — Progress Notes (Signed)
   Subjective:    Patient ID: Tracy Weiss is a 54 y.o. female presenting with Follow-up  on 12/03/2019  HPI: Still with leaking clear fluid, here to r/o infection and for exam to see about vesicovaginal fistula.  Review of Systems  Constitutional: Negative for chills and fever.  Respiratory: Negative for shortness of breath.   Cardiovascular: Negative for chest pain.  Gastrointestinal: Negative for abdominal pain, nausea and vomiting.  Genitourinary: Negative for dysuria.  Skin: Negative for rash.      Objective:    BP 125/81   Pulse 96   Wt 181 lb 14.4 oz (82.5 kg)   BMI 33.81 kg/m  Physical Exam Constitutional:      General: She is not in acute distress.    Appearance: She is well-developed.  HENT:     Head: Normocephalic and atraumatic.  Eyes:     General: No scleral icterus. Cardiovascular:     Rate and Rhythm: Normal rate.  Pulmonary:     Effort: Pulmonary effort is normal.  Abdominal:     Palpations: Abdomen is soft.  Genitourinary:    Comments: BUS normal, vagina is pink and rugated, cervix is without lesion, uterus is enlarged 12 wk size, firm, and anteverted, no adnexal mass or tenderness. No obvious leaking into vagina.  Musculoskeletal:     Cervical back: Neck supple.  Skin:    General: Skin is warm and dry.  Neurological:     Mental Status: She is alert and oriented to person, place, and time.         Assessment & Plan:   Problem List Items Addressed This Visit      Unprioritized   Abnormal uterine bleeding    Provera is working well, continue.       Other Visit Diagnoses    Vaginal discharge    -  Primary   No evidence of fistula--check cultures and treat accordingly.   Relevant Orders   Cervicovaginal ancillary only( [facility])      Total time: 20 minutes.  Return if symptoms worsen or fail to improve.  Tracy Weiss 12/03/2019 4:48 PM

## 2019-12-03 NOTE — Assessment & Plan Note (Signed)
Provera is working well, continue.

## 2019-12-03 NOTE — Progress Notes (Signed)
Pt takes blood pressure medications- for kidney protection d/t diabetes, not for HTN  Pt had second dose of covid vaccine on 12-03-19  Pt's previsit is done over the phone and all paperwork (prep instructions, blank consent form to just read over, pre-procedure acknowledgement form and stamped envelope) sent to patient  Pt is aware that care partner will wait in the car during procedure; if they feel like they will be too hot or cold to wait in the car; they may wait in the 4 th floor lobby. Patient is aware to bring only one care partner. We want them to wear a mask (we do not have any that we can provide them), practice social distancing, and we will check their temperatures when they get here.  I did remind the patient that their care partner needs to stay in the parking lot the entire time and have a cell phone available, we will call them when the pt is ready for discharge. Patient will wear mask into building.   No trouble with anesthesia, difficulty with intubation or hx/fam hx of malignant hyperthermia per pt   No egg or soy allergy  No home oxygen use   No medications for weight loss taken  emmi information given  Pt does not take Phenterine

## 2019-12-04 LAB — CERVICOVAGINAL ANCILLARY ONLY
Bacterial Vaginitis (gardnerella): NEGATIVE
Candida Glabrata: NEGATIVE
Candida Vaginitis: NEGATIVE
Chlamydia: NEGATIVE
Comment: NEGATIVE
Comment: NEGATIVE
Comment: NEGATIVE
Comment: NEGATIVE
Comment: NEGATIVE
Comment: NORMAL
Neisseria Gonorrhea: NEGATIVE
Trichomonas: NEGATIVE

## 2019-12-17 ENCOUNTER — Telehealth: Payer: Self-pay | Admitting: Pharmacist

## 2019-12-17 MED ORDER — NOVOLIN 70/30 (70-30) 100 UNIT/ML ~~LOC~~ SUSP: SUBCUTANEOUS | Status: DC

## 2019-12-17 NOTE — Telephone Encounter (Signed)
Patient calls reporting her blood sugars are fair in control but still too high.  She reported morning readings of > 200.   She reported taking Novolin 70/30 at a dose of 20 units in the AM and 15 units in the PM.  She is also taking Ozempic 0.25mg  weekly.   Following some discussion, we agreed to continue same dose Ozempic, and  Increase 70/30 insulin to 30 units in the AM and 18 units in the PM.  She reports an abundant supply of 70/30 *NOTE - she was prescribed 30 units in the AM last time we talked.  I am unsure why she reduced that dose.    She requested Ozempic samples and requested pick-up in the "next few minutes".   Medication Samples have been provided for the patient to pick up.  Drug name: Ozempic (semaglutide)          Qty: 2 pens  LOT: [id number]  Exp.Date: 10/03/2021  Dosing instructions: Once weekly.   The patient has been instructed regarding the correct time, dose, and frequency of taking this medication, including desired effects and most common side effects.   Calixto Pavel 1:19 PM 12/17/2019

## 2019-12-17 NOTE — Telephone Encounter (Signed)
Noted and agree. 

## 2019-12-18 ENCOUNTER — Encounter: Payer: No Typology Code available for payment source | Admitting: Gastroenterology

## 2019-12-22 ENCOUNTER — Encounter: Payer: Self-pay | Admitting: Family Medicine

## 2019-12-22 ENCOUNTER — Telehealth: Payer: Self-pay | Admitting: Family Medicine

## 2019-12-22 ENCOUNTER — Other Ambulatory Visit: Payer: Self-pay

## 2019-12-22 ENCOUNTER — Ambulatory Visit (INDEPENDENT_AMBULATORY_CARE_PROVIDER_SITE_OTHER): Payer: BLUE CROSS/BLUE SHIELD | Admitting: Family Medicine

## 2019-12-22 VITALS — BP 114/72 | HR 94 | Wt 182.0 lb

## 2019-12-22 DIAGNOSIS — S99921A Unspecified injury of right foot, initial encounter: Secondary | ICD-10-CM

## 2019-12-22 DIAGNOSIS — R350 Frequency of micturition: Secondary | ICD-10-CM | POA: Diagnosis not present

## 2019-12-22 DIAGNOSIS — R300 Dysuria: Secondary | ICD-10-CM | POA: Diagnosis not present

## 2019-12-22 DIAGNOSIS — Z794 Long term (current) use of insulin: Secondary | ICD-10-CM | POA: Diagnosis not present

## 2019-12-22 DIAGNOSIS — E119 Type 2 diabetes mellitus without complications: Secondary | ICD-10-CM | POA: Diagnosis not present

## 2019-12-22 DIAGNOSIS — E785 Hyperlipidemia, unspecified: Secondary | ICD-10-CM

## 2019-12-22 DIAGNOSIS — I10 Essential (primary) hypertension: Secondary | ICD-10-CM

## 2019-12-22 LAB — POCT GLYCOSYLATED HEMOGLOBIN (HGB A1C): HbA1c, POC (controlled diabetic range): 7.9 % — AB (ref 0.0–7.0)

## 2019-12-22 LAB — POCT URINALYSIS DIP (MANUAL ENTRY)
Bilirubin, UA: NEGATIVE
Glucose, UA: 500 mg/dL — AB
Ketones, POC UA: NEGATIVE mg/dL
Leukocytes, UA: NEGATIVE
Nitrite, UA: NEGATIVE
Protein Ur, POC: NEGATIVE mg/dL
Spec Grav, UA: >=1.03 — AB (ref 1.010–1.025)
Urobilinogen, UA: 0.2 E.U./dL
pH, UA: 5.5 (ref 5.0–8.0)

## 2019-12-22 LAB — POCT UA - MICROSCOPIC ONLY

## 2019-12-22 MED ORDER — ROSUVASTATIN CALCIUM 10 MG PO TABS: 10.0000 mg | ORAL_TABLET | Freq: Every day | ORAL | 1 refills | Status: DC

## 2019-12-22 MED ORDER — OLMESARTAN MEDOXOMIL-HCTZ 40-25 MG PO TABS: 1.0000 | ORAL_TABLET | Freq: Every day | ORAL | 1 refills | Status: DC

## 2019-12-22 MED ORDER — FLUCONAZOLE 150 MG PO TABS: ORAL_TABLET | ORAL | 0 refills | Status: DC

## 2019-12-22 NOTE — Progress Notes (Signed)
SUBJECTIVE:   CHIEF COMPLAINT / HPI:   Dysuria: C/O burning with urination which started about 2-3 days ago. However, since yesterday, she has developed increased urine frequency. She denies change in her urine color. She had a recent endometrial ablation and still spots intermittently, hence occasional blood in her urine. Hx of a recurrent yeast infection with poor glucose control. She feels she might be having a yeast infection again and will like a refill of her Diflucan.  DM2: She is currently on Novolog 70/30, 30 units AM and 18 units PM, Metformin 1000 mg, BID, Ozempic weekly. She is in a hurry and not able to discuss DM today. She mises a few dose of her Metformin.  Toe injury: She had a heavy object drop on her right big toe weeks ago. Her nail has since fallen off. She denies any pain. She wants me to check to ensure it is ok.  HTN/HLD: Requested med refills to [facility].   PERTINENT  PMH / PSH: PMX reviewed  OBJECTIVE:   BP 114/72   Pulse 94   Wt 182 lb (82.6 kg)   SpO2 98%   BMI 33.83 kg/m    Physical Exam Vitals and nursing note reviewed.  Cardiovascular:     Rate and Rhythm: Normal rate and regular rhythm.     Pulses: Normal pulses.     Heart sounds: Normal heart sounds. No murmur.  Pulmonary:     Effort: Pulmonary effort is normal. No respiratory distress.     Breath sounds: Normal breath sounds. No wheezing.  Abdominal:     General: Bowel sounds are normal. There is no distension.     Palpations: Abdomen is soft.  Musculoskeletal:     Right lower leg: No edema.     Left lower leg: No edema.     Comments: Right big toenail partial avulsion, well heeled with no discoloration except for artificial nail polish as seen in the image below.        ASSESSMENT/PLAN:   Dysuria UA negative for leukocyte or Nitrite. + RBC. However, she is currently spotting. Urine culture sent. I will treat her with A/B if it returns positive. In the meantime, given  hx of recurrent yeast infection, I will treat her symptom (burning with urination) as candida vaginitis. Empirical treatment with Diflucan. Wet prep was not done as she is not endorsing vaginal discharge. I advised her that if the urine culture is negative and Diflucan is ineffective for her symptoms, she will need to return to PCP for reassessment. She agreed with the plan.    Type 2 diabetes mellitus without complication, with long-term current use of insulin (HCC) Although not always compliant with her Metformin, her A1C improved a lot from 11 to 7.9. Compliance with Metformin encouraged. I reviewed a recent telephone conversation with Dr. Valentina Lucks, who recently adjusted her Insulin dose. She will return to Dr. Valentina Lucks and her PCP for further management.  Toe injury Her right big toenail looks good. I reassured her that nail might regrow. Otherwise, as long as she does not hurt, we will do nothing for now. She verbalized understanding.  Essential hypertension, benign BP looks good. Med refilled. Cmet checked today. I will contact her soon with the result.  Hyperlipidemia Lipid panel checked. Made refilled. I will contact her with results.   Note: She requested Xanax refill. I messaged her PCP who will follow-up with her on this. She agreed with the plan.  Tracy Mews, MD  [place]

## 2019-12-22 NOTE — Patient Instructions (Signed)

## 2019-12-22 NOTE — Assessment & Plan Note (Signed)
Lipid panel checked. Made refilled. I will contact her with results.

## 2019-12-22 NOTE — Assessment & Plan Note (Signed)
Her right big toenail looks good. I reassured her that nail might regrow. Otherwise, as long as she does not hurt, we will do nothing for now. She verbalized understanding.

## 2019-12-22 NOTE — Telephone Encounter (Signed)
2 days of urinary urgency, UTI.   Urgency since Sunday, Saturday, dysuria since yester  **After Hours/ Emergency Line Call**  Received a call to report that Tracy Weiss  Was experiencing urinary urgency/frequency for 2-3 days and 1 day of dysuria.  Pt states she doesn't get UTI's often but that this feels like her previous UTIs. Pt denies other symptoms. Patient wants to be seen tomorrow at our clinic for this issue. Acces to care was full but  I was able to get her in with Dr. Wyn Quaker flags discussed.  Will forward to PCP and Dr. Gwendlyn Deutscher. Would consider checking glucose in addition to working her up for UTI as she is a poorly controlled diabetic.    Clemetine Marker, MD PGY-2, [facility] 12/22/2019 12:21 AM

## 2019-12-22 NOTE — Assessment & Plan Note (Signed)
Although not always compliant with her Metformin, her A1C improved a lot from 11 to 7.9. Compliance with Metformin encouraged. I reviewed a recent telephone conversation with Dr. Valentina Lucks, who recently adjusted her Insulin dose. She will return to Dr. Valentina Lucks and her PCP for further management.

## 2019-12-22 NOTE — Assessment & Plan Note (Signed)
BP looks good. Med refilled. Cmet checked today. I will contact her soon with the result.

## 2019-12-22 NOTE — Assessment & Plan Note (Signed)
UA negative for leukocyte or Nitrite. + RBC. However, she is currently spotting. Urine culture sent. I will treat her with A/B if it returns positive. In the meantime, given hx of recurrent yeast infection, I will treat her symptom (burning with urination) as candida vaginitis. Empirical treatment with Diflucan. Wet prep was not done as she is not endorsing vaginal discharge. I advised her that if the urine culture is negative and Diflucan is ineffective for her symptoms, she will need to return to PCP for reassessment. She agreed with the plan.

## 2019-12-23 ENCOUNTER — Telehealth: Payer: Self-pay | Admitting: Family Medicine

## 2019-12-23 LAB — CMP14+EGFR
ALT: 15 IU/L (ref 0–32)
AST: 17 IU/L (ref 0–40)
Albumin/Globulin Ratio: 1.6 (ref 1.2–2.2)
Albumin: 4.4 g/dL (ref 3.8–4.9)
Alkaline Phosphatase: 63 IU/L (ref 39–117)
BUN/Creatinine Ratio: 9 (ref 9–23)
BUN: 9 mg/dL (ref 6–24)
Bilirubin Total: 0.4 mg/dL (ref 0.0–1.2)
CO2: 24 mmol/L (ref 20–29)
Calcium: 9.8 mg/dL (ref 8.7–10.2)
Chloride: 102 mmol/L (ref 96–106)
Creatinine, Ser: 0.98 mg/dL (ref 0.57–1.00)
GFR calc Af Amer: 77 mL/min/{1.73_m2} (ref >59)
GFR calc non Af Amer: 67 mL/min/{1.73_m2} (ref >59)
Globulin, Total: 2.7 g/dL (ref 1.5–4.5)
Glucose: 351 mg/dL — ABNORMAL HIGH (ref 65–99)
Potassium: 4.3 mmol/L (ref 3.5–5.2)
Sodium: 139 mmol/L (ref 134–144)
Total Protein: 7.1 g/dL (ref 6.0–8.5)

## 2019-12-23 LAB — LIPID PANEL
Chol/HDL Ratio: 3.4 ratio (ref 0.0–4.4)
Cholesterol, Total: 193 mg/dL (ref 100–199)
HDL: 56 mg/dL (ref >39)
LDL Chol Calc (NIH): 119 mg/dL — ABNORMAL HIGH (ref 0–99)
Triglycerides: 98 mg/dL (ref 0–149)
VLDL Cholesterol Cal: 18 mg/dL (ref 5–40)

## 2019-12-23 MED ORDER — ROSUVASTATIN CALCIUM 20 MG PO TABS: 20.0000 mg | ORAL_TABLET | Freq: Every day | ORAL | 1 refills | Status: DC

## 2019-12-23 NOTE — Telephone Encounter (Signed)
I called to discuss her result.  LDL elevated.  Goal should be <70 given her DM.  She is compliant with her meds, but has been out of Crestor for a few days.  I discussed going up on her Crestor to 20 mg qd. She agreed with the plan.  I will call her pharm to cancel the 10 mg refill. - Spoke with the pharmacist Elmo Putt) to cancel med.  She will f/u with PCP and Dr. Valentina Lucks on insulin adjustment for her DM.  No other concerns today.

## 2019-12-24 ENCOUNTER — Telehealth (INDEPENDENT_AMBULATORY_CARE_PROVIDER_SITE_OTHER): Payer: No Typology Code available for payment source | Admitting: Psychiatry

## 2019-12-24 ENCOUNTER — Other Ambulatory Visit: Payer: Self-pay

## 2019-12-24 ENCOUNTER — Encounter: Payer: Self-pay | Admitting: Psychiatry

## 2019-12-24 DIAGNOSIS — F900 Attention-deficit hyperactivity disorder, predominantly inattentive type: Secondary | ICD-10-CM

## 2019-12-24 LAB — URINE CULTURE

## 2019-12-24 MED ORDER — LISDEXAMFETAMINE DIMESYLATE 70 MG PO CAPS: 70.0000 mg | ORAL_CAPSULE | Freq: Every morning | ORAL | 0 refills | Status: DC

## 2019-12-24 NOTE — Progress Notes (Signed)
BH MD/PA/NP OP Progress Note  I connected with  [name] on 12/24/19 by phone and verified that I am speaking with the correct person using two identifiers.   I discussed the limitations of evaluation and management by phone. The patient expressed understanding and agreed to proceed.    12/24/2019 8:50 AM [name]  MRN:  [id number]  Chief Complaint: " I am doing okay."  HPI: Pt informed that she is at the dentist office at present so wanted to give quick feedback. She stated that Vyvanse 50 mg has been helpful in her ability to focus and stay on task. But she complained that wears off early in the afternoon so she wants to go up on the dose. She wants to increase it to maximum dose of 70 mg. She denied any side effects. She denied any other concerns or issues at this time.   Visit Diagnosis:    ICD-10-CM   1. Attention deficit hyperactivity disorder (ADHD), predominantly inattentive type  F90.0     Past Psychiatric History: ADHD  Past Medical History:  Past Medical History:  Diagnosis Date  . Abnormal uterine bleeding (AUB)   . Abnormal uterine bleeding (AUB)   . ADHD   . Allergy   . Anxiety   . Diabetes mellitus without complication ([place])   . Gallstones 09/2016  . GERD (gastroesophageal reflux disease)    after gallbladder removed  . Hyperlipidemia    per pt "taking as preventative"    Past Surgical History:  Procedure Laterality Date  . CESAREAN SECTION    . CHOLECYSTECTOMY N/A 09/11/2016   Procedure: LAPAROSCOPIC CHOLECYSTECTOMY WITH INTRAOPERATIVE CHOLANGIOGRAM;  Surgeon: Donnie Mesa, MD;  Location: [facility];  Service: General;  Laterality: N/A;  . DILATATION AND CURETTAGE/HYSTEROSCOPY WITH MINERVA N/A 02/04/2019   Procedure: DILATATION AND CURETTAGE/ABLATION WITH MINERVA;  Surgeon: Emily Filbert, MD;  Location: [facility];  Service: Gynecology;  Laterality: N/A;  MINERVA ABLATION  . tubal ligaton reversal  2017    Family Psychiatric History: Family hx  of ADHD in brothers, daughters and grandson  Family History:  Family History  Problem Relation Age of Onset  . Cancer Mother        Metastatic with Unknown Primary; Stomach was involved  . Diabetes Father   . Heart disease Father   . Breast cancer Neg Hx   . Colon cancer Neg Hx   . Esophageal cancer Neg Hx   . Rectal cancer Neg Hx   . Stomach cancer Neg Hx     Social History:  Social History   Socioeconomic History  . Marital status: Single    Spouse name: Not on file  . Number of children: Not on file  . Years of education: Not on file  . Highest education level: Master's degree (e.g., MA, MS, MEng, MEd, MSW, MBA)  Occupational History  . Not on file  Tobacco Use  . Smoking status: Never Smoker  . Smokeless tobacco: Never Used  Substance and Sexual Activity  . Alcohol use: Yes    Alcohol/week: 0.0 standard drinks    Comment: occassionally  . Drug use: No  . Sexual activity: Not Currently    Birth control/protection: None  Other Topics Concern  . Not on file  Social History Narrative  . Not on file   Social Determinants of Health   Financial Resource Strain:   . Difficulty of Paying Living Expenses:   Food Insecurity: Food Insecurity Present  . Worried About Estate manager/land agent  of Food in the Last Year: Sometimes true  . Ran Out of Food in the Last Year: Sometimes true  Transportation Needs: No Transportation Needs  . Lack of Transportation (Medical): No  . Lack of Transportation (Non-Medical): No  Physical Activity:   . Days of Exercise per Week:   . Minutes of Exercise per Session:   Stress:   . Feeling of Stress :   Social Connections:   . Frequency of Communication with Friends and Family:   . Frequency of Social Gatherings with Friends and Family:   . Attends Religious Services:   . Active Member of Clubs or Organizations:   . Attends Archivist Meetings:   Marland Kitchen Marital Status:     Allergies: No Known Allergies  Metabolic Disorder Labs: Lab  Results  Component Value Date   HGBA1C 7.9 (A) 12/22/2019   Lab Results  Component Value Date   PROLACTIN 11.5 10/07/2018   PROLACTIN 10.3 06/11/2012   Lab Results  Component Value Date   CHOL 193 12/22/2019   TRIG 98 12/22/2019   HDL 56 12/22/2019   CHOLHDL 3.4 12/22/2019   VLDL 26 08/13/2016   LDLCALC 119 (H) 12/22/2019   LDLCALC 98 09/23/2017   Lab Results  Component Value Date   TSH 0.694 10/07/2018   TSH 1.080 09/23/2017    Therapeutic Level Labs: No results found for: LITHIUM No results found for: VALPROATE No components found for:  CBMZ  Current Medications: Current Outpatient Medications  Medication Sig Dispense Refill  . ALPRAZolam (XANAX) 0.25 MG tablet Take 0.25 mg by mouth at bedtime as needed for anxiety.    Marland Kitchen aspirin 81 MG chewable tablet Chew by mouth daily.    . cycloSPORINE (RESTASIS) 0.05 % ophthalmic emulsion Place 1 drop into both eyes 2 (two) times daily as needed (dry eyes).    . fluconazole (DIFLUCAN) 150 MG tablet TAKE 1 TABLET BY MOUTH TODAY, AND 1 TABLET IN 72HRS 2 tablet 0  . insulin aspart (FIASP FLEXTOUCH) 100 UNIT/ML FlexTouch Pen Inject 10 Units into the skin 3 (three) times daily before meals.    . insulin aspart (NOVOLOG FLEXPEN) 100 UNIT/ML FlexPen Inject 10 Units into the skin 3 (three) times daily with meals. (Patient not taking: Reported on 12/17/2019) 2 pen 0  . Insulin Glargine (LANTUS) 100 UNIT/ML Solostar Pen Inject 35 Units into the skin daily before breakfast. (Patient not taking: Reported on 12/17/2019) 6 mL 0  . insulin NPH-regular Human (NOVOLIN 70/30) (70-30) 100 UNIT/ML injection Inject 30 Units into the skin daily with breakfast AND 18 Units daily with supper.    . lisdexamfetamine (VYVANSE) 50 MG capsule Take 1 capsule (50 mg total) by mouth every morning. 30 capsule 0  . medroxyPROGESTERone (PROVERA) 10 MG tablet Take 1 tablet (10 mg total) by mouth daily. Use for ten days 10 tablet 12  . metFORMIN (GLUCOPHAGE) 1000 MG tablet  Take 1 tablet (1,000 mg total) by mouth 2 (two) times daily with a meal. 180 tablet 3  . metoprolol tartrate (LOPRESSOR) 100 MG tablet TAKE 1 TABLET 1 HR PRIOR TO CARDIAC PROCEDURE (Patient not taking: Reported on 12/22/2019) 1 tablet 0  . olmesartan-hydrochlorothiazide (BENICAR HCT) 40-25 MG tablet Take 1 tablet by mouth daily. 90 tablet 1  . Prenatal Vit-Fe Fumarate-FA (PRENATAL VITAMIN PO) Take 1 tablet by mouth.    . rosuvastatin (CRESTOR) 20 MG tablet Take 1 tablet (20 mg total) by mouth daily. 90 tablet 1  . Semaglutide,0.25 or 0.5MG /DOS, (OZEMPIC, 0.25  OR 0.5 MG/DOSE,) 2 MG/1.5ML SOPN Inject 0.25 mg into the skin once a week. 1 pen 0   No current facility-administered medications for this visit.      Psychiatric Specialty Exam: Review of Systems  There were no vitals taken for this visit.There is no height or weight on file to calculate BMI.  General Appearance: unable to assess due to phone visit  Eye Contact:  unable to assess due to phone visit   Speech:  Clear and Coherent and Normal Rate  Volume:  Normal  Mood:  Euthymic  Affect:  Congruent  Thought Process:  Goal Directed, Linear and Descriptions of Associations: Intact  Orientation:  Full (Time, Place, and Person)  Thought Content: Logical   Suicidal Thoughts:  No  Homicidal Thoughts:  No  Memory:  Recent;   Good Remote;   Good  Judgement:  Good  Insight:  Good  Psychomotor Activity:  Normal  Concentration:  Concentration: Good and Attention Span: Good  Recall:  Good  Fund of Knowledge: Good  Language: Good  Akathisia:  Negative  Handed:  Right  AIMS (if indicated): not done  Assets:  Communication Skills Desire for Improvement Financial Resources/Insurance Housing  ADL's:  Intact  Cognition: WNL  Sleep:  Good   Screenings: GAD-7     Office Visit from 12/03/2019 in [place] for [facility]  Total GAD-7 Score  7    PHQ2-9     Office Visit from 12/22/2019 in [place]  Office Visit from 12/03/2019 in [facility] Office Visit from 11/06/2019 in [place] Office Visit from 08/17/2019 in [place] Office Visit from 02/24/2019 in [place]  PHQ-2 Total Score  0  3  2  0  0  PHQ-9 Total Score  --  10  --  --  --       Assessment and Plan: Pt reported improvement in her ADHD symptoms with 50 mg dose of Vyvanse but wants to increase the dose to 70 mg for full benefit. She requested 3 month follow up.  1. Attention deficit hyperactivity disorder (ADHD), predominantly inattentive type  - Increase lisdexamfetamine (VYVANSE) 70 MG capsule; Take 1 capsule (70 mg total) by mouth in the morning.  Dispense: 30 capsule; Refill: 0 - lisdexamfetamine (VYVANSE) 70 MG capsule; Take 1 capsule (70 mg total) by mouth in the morning.  Dispense: 30 capsule; Refill: 0 - lisdexamfetamine (VYVANSE) 70 MG capsule; Take 1 capsule (70 mg total) by mouth in the morning.  Dispense: 30 capsule; Refill: 0   F/up in 3 months.  [place] Crane, MD 12/24/2019, 8:50 AM

## 2019-12-25 ENCOUNTER — Telehealth: Payer: Self-pay | Admitting: *Deleted

## 2019-12-25 NOTE — Telephone Encounter (Signed)
-----   Message from Caroline More, DO sent at 12/22/2019  2:02 PM EDT ----- Hi Red Team,  Dr. Gwendlyn Deutscher asked me to ask you to reach out to this patient.  She saw Dr. Gwendlyn Deutscher today and requested a refill of Xanax. Per Dr. Gwendlyn Deutscher she would like red team to reach out and inform patient she needs an appointment for further refills.   Dalphine Handing, PGY-3 [facility] 12/22/2019 2:04 PM

## 2019-12-25 NOTE — Telephone Encounter (Signed)
Spoke with pt and she got upset about having to come in to be seen for the refill. She doesn't want to speak with any one else about it but dr Tammi Klippel. Please call pt back. Tracy Weiss, CMA

## 2019-12-28 NOTE — Telephone Encounter (Signed)
Called and discussed with patient.   She was very upset that we did not refill xanax as she had previously been taking this. Patient states she is unable to come in person and only wants virtual. States she is upset as she has been taking this as needed so should not need another visit. I informed patient that since this was a controlled substance and she had not had a refill in 1 year she would need to be re-evaluated to ensure this is still appropriate. Although patient is upset she was willing to have video visit to discuss this further.   PMP aware reviewed: Last refill of alprazolam 0.25mg  #30tabs by Dr. McDiarmid on 12/05/2018  Caroline More, DO, PGY-3 [facility] 12/28/2019 8:58 AM

## 2019-12-30 ENCOUNTER — Telehealth: Payer: Self-pay | Admitting: Family Medicine

## 2019-12-30 NOTE — Telephone Encounter (Signed)
**  After Hours/ Emergency Line Call**  Received a call to report that Tracy Weiss was concerned about her blood glucose being too low in the setting of fasting today for her bowel prep and colonoscopy tomorrow. She states she gave her self 30 U of 70/30 sometime this morning.  In the afternoon she felt a little 'dizzy' but this has resolved.  She is concerned though that her most recent cbg's have been in the 31s.  She has been using miralax mixed in zero calorie gatorade.  She did not take her evening dose of insulin.  She does not have ginger ale or sprite at home, but does have mountain dew.  I reassured the patient that it is normal for people who are fasting to have blood glucose readings in the 70s but that if she felt symptomatic (dizziness, weakness, n/v) to recheck glucose and drink some mountain dew if it is low.  If she is still concerned about her blood sugar being too low after that I told her she can always stop the bowel prep and reschedule her appointment. Patient agreed to continue bowel prep, check blood sugar in am before her appointment, and call her GI doctor and report the blood glucose levels to them.  Red flags discussed.  Will forward to PCP.  Clemetine Marker, MD PGY-2, [facility] 12/30/2019 11:57 PM

## 2019-12-31 ENCOUNTER — Other Ambulatory Visit: Payer: Self-pay

## 2019-12-31 ENCOUNTER — Ambulatory Visit: Payer: BLUE CROSS/BLUE SHIELD | Admitting: Gastroenterology

## 2019-12-31 ENCOUNTER — Encounter: Payer: Self-pay | Admitting: Gastroenterology

## 2019-12-31 VITALS — BP 119/79 | HR 101 | Temp 97.3°F | Ht 61.5 in | Wt 178.0 lb

## 2019-12-31 DIAGNOSIS — Z1211 Encounter for screening for malignant neoplasm of colon: Secondary | ICD-10-CM

## 2019-12-31 MED ORDER — SODIUM CHLORIDE 0.9 % IV SOLN: 500.0000 mL | Freq: Once | INTRAVENOUS | Status: DC

## 2019-12-31 MED ORDER — FLEET ENEMA 7-19 GM/118ML RE ENEM
1.0000 | ENEMA | Freq: Once | RECTAL | Status: AC
Start: 2019-12-31 — End: 2019-12-31
  Administered 2019-12-31: 09:00:00 1 via RECTAL

## 2019-12-31 NOTE — Progress Notes (Signed)
Temp-JB VS-KA   Pt reports stool is cloudy brown, cannot see through to the bottom of the toilet. Dr. Loletha Carrow made aware. Orders received to give pt an enema and assess results.   Results- stool is still cloudy brown. Dr. Loletha Carrow made aware. Awaiting further instructions.    Pt not prepped well enough for maximum procedure results. Per Dr. Loletha Carrow will reschedule patient for tomorrow at 0830. Pt agreeable. Sent home with Sutab samples and instructions for procedure prep.

## 2020-01-01 ENCOUNTER — Encounter: Payer: Self-pay | Admitting: Gastroenterology

## 2020-01-01 ENCOUNTER — Ambulatory Visit (AMBULATORY_SURGERY_CENTER): Payer: BLUE CROSS/BLUE SHIELD | Admitting: Gastroenterology

## 2020-01-01 VITALS — BP 123/82 | HR 76 | Temp 96.8°F | Resp 16

## 2020-01-01 DIAGNOSIS — Z1211 Encounter for screening for malignant neoplasm of colon: Secondary | ICD-10-CM | POA: Diagnosis not present

## 2020-01-01 MED ORDER — SODIUM CHLORIDE 0.9 % IV SOLN
500.0000 mL | Freq: Once | INTRAVENOUS | Status: DC
Start: 2020-01-01 — End: 2020-01-01

## 2020-01-01 NOTE — Progress Notes (Signed)
Pt's states no medical or surgical changes since previsit or office visit.  Temp- June Vitals- [place] 

## 2020-01-01 NOTE — Op Note (Signed)
Tracy Weiss: Tracy Weiss Procedure Date: 01/01/2020 8:07 AM MRN: [id number] Endoscopist: Mallie Mussel L. Loletha Carrow , MD Age: 54 Referring MD:  Date of Birth: 07/25/1966 Gender: Female Account #: [id number] Procedure:                Colonoscopy Indications:              Screening for colorectal malignant neoplasm, This                            is the patient's first colonoscopy Medicines:                Monitored Anesthesia Care Procedure:                Pre-Anesthesia Assessment:                           - Prior to the procedure, a History and Physical                            was performed, and patient medications and                            allergies were reviewed. The patient's tolerance of                            previous anesthesia was also reviewed. The risks                            and benefits of the procedure and the sedation                            options and risks were discussed with the patient.                            All questions were answered, and informed consent                            was obtained. Prior Anticoagulants: The patient has                            taken no previous anticoagulant or antiplatelet                            agents. ASA Grade Assessment: II - A patient with                            mild systemic disease. After reviewing the risks                            and benefits, the patient was deemed in                            satisfactory condition to undergo the procedure.  After obtaining informed consent, the colonoscope                            was passed under direct vision. Throughout the                            procedure, the patient's blood pressure, pulse, and                            oxygen saturations were monitored continuously. The                            Colonoscope was introduced through the anus and                            advanced to the the cecum,  identified by                            appendiceal orifice and ileocecal valve. The                            colonoscopy was performed without difficulty. The                            patient tolerated the procedure well. The quality                            of the bowel preparation was good. The ileocecal                            valve, appendiceal orifice, and rectum were                            photographed. The bowel preparation used was                            Miralax and one bottle Suprep (insufficient prep                            after miralax, then came back next day after                            additional Suprep). Scope In: 8:14:13 AM Scope Out: 8:28:54 AM Scope Withdrawal Time: 0 hours 10 minutes 15 seconds  Total Procedure Duration: 0 hours 14 minutes 41 seconds  Findings:                 The perianal and digital rectal examinations were                            normal.                           The entire examined colon appeared normal on direct  and retroflexion views. Complications:            No immediate complications. Estimated Blood Loss:     Estimated blood loss: none. Impression:               - The entire examined colon is normal on direct and                            retroflexion views.                           - No specimens collected. Recommendation:           - Patient has a contact number available for                            emergencies. The signs and symptoms of potential                            delayed complications were discussed with the                            patient. Return to normal activities tomorrow.                            Written discharge instructions were provided to the                            patient.                           - Resume previous diet.                           - Continue present medications.                           - Repeat colonoscopy in 10 years for  screening                            purposes. Rutledge Selsor L. Loletha Carrow, MD 01/01/2020 8:32:10 AM This report has been signed electronically.

## 2020-01-01 NOTE — Patient Instructions (Signed)
YOU HAD AN ENDOSCOPIC PROCEDURE TODAY AT THE [facility]:   Refer to the procedure report that was given to you for any specific questions about what was found during the examination.  If the procedure report does not answer your questions, please call your gastroenterologist to clarify.  If you requested that your care partner not be given the details of your procedure findings, then the procedure report has been included in a sealed envelope for you to review at your convenience later.  YOU SHOULD EXPECT: Some feelings of bloating in the abdomen. Passage of more gas than usual.  Walking can help get rid of the air that was put into your GI tract during the procedure and reduce the bloating. If you had a lower endoscopy (such as a colonoscopy or flexible sigmoidoscopy) you may notice spotting of blood in your stool or on the toilet paper. If you underwent a bowel prep for your procedure, you may not have a normal bowel movement for a few days.  Please Note:  You might notice some irritation and congestion in your nose or some drainage.  This is from the oxygen used during your procedure.  There is no need for concern and it should clear up in a day or so.  SYMPTOMS TO REPORT IMMEDIATELY:   Following lower endoscopy (colonoscopy or flexible sigmoidoscopy):  Excessive amounts of blood in the stool  Significant tenderness or worsening of abdominal pains  Swelling of the abdomen that is new, acute  Fever of 100F or higher  For urgent or emergent issues, a gastroenterologist can be reached at any hour by calling [phone]. Do not use MyChart messaging for urgent concerns.    DIET:  We do recommend a small meal at first, but then you may proceed to your regular diet.  Drink plenty of fluids but you should avoid alcoholic beverages for 24 hours.  ACTIVITY:  You should plan to take it easy for the rest of today and you should NOT DRIVE or use heavy machinery until tomorrow (because  of the sedation medicines used during the test).    FOLLOW UP: Our staff will call the number listed on your records 48-72 hours following your procedure to check on you and address any questions or concerns that you may have regarding the information given to you following your procedure. If we do not reach you, we will leave a message.  We will attempt to reach you two times.  During this call, we will ask if you have developed any symptoms of COVID 19. If you develop any symptoms (ie: fever, flu-like symptoms, shortness of breath, cough etc.) before then, please call [phone].  If you test positive for Covid 19 in the 2 weeks post procedure, please call and report this information to us.    If any biopsies were taken you will be contacted by phone or by letter within the next 1-3 weeks.  Please call us at [phone] if you have not heard about the biopsies in 3 weeks.    SIGNATURES/CONFIDENTIALITY: You and/or your care partner have signed paperwork which will be entered into your electronic medical record.  These signatures attest to the fact that that the information above on your After Visit Summary has been reviewed and is understood.  Full responsibility of the confidentiality of this discharge information lies with you and/or your care-partner. 

## 2020-01-01 NOTE — Progress Notes (Signed)
Report to PACU, RN, vss, BBS= Clear.  

## 2020-01-04 ENCOUNTER — Other Ambulatory Visit: Payer: Self-pay

## 2020-01-04 ENCOUNTER — Telehealth (INDEPENDENT_AMBULATORY_CARE_PROVIDER_SITE_OTHER): Payer: BLUE CROSS/BLUE SHIELD | Admitting: Family Medicine

## 2020-01-04 DIAGNOSIS — F419 Anxiety disorder, unspecified: Secondary | ICD-10-CM

## 2020-01-04 MED ORDER — ALPRAZOLAM 0.25 MG PO TABS: 0.2500 mg | ORAL_TABLET | Freq: Every evening | ORAL | 0 refills | Status: DC | PRN

## 2020-01-04 NOTE — Progress Notes (Signed)
[place] Telemedicine Visit I connected with  Tien Bouthillier on 01/04/20 by a video enabled telemedicine application and verified that I am speaking with the correct person using two identifiers.   I discussed the limitations of evaluation and management by telemedicine. The patient expressed understanding and agreed to proceed.  Patient consented to have virtual visit and was identified by name and date of birth. Method of visit: Video  Encounter participants: Patient: Tracy Weiss - located at Home Provider: Caroline More - located at [facility]  Others (if applicable): None  Chief Complaint: anxiety  HPI:  Anxiety  Patient presenting for request of refill of her Xanax.  States that she uses it as needed.  Is using it now because she has a stressful job, works at a [employer].  Is also taking classes.  She has increased anxiety and her job has even noted that she has increased frustration at work. 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 Patient reports her anxiety is relieved with anxiety. Using it mainly in evenings as needed. Patient's nephew was recently killed. Has a lot of pressure from family as well.  Reports that when she uses her Xanax as needed she feels improved.  ROS: per HPI  Pertinent PMHx: ADHD, anxiety  Exam:  There were no vitals taken for this visit.  Respiratory: speaking full sentences, no increased WOB   Assessment/Plan:  Anxiety Patient presenting with concerns for anxiety.  Reports ports that alprazolam has worked in the past.  [place] reviewed. Patient's last alprazolam refill was on 12/05/2018.  30 pills at that time.  Patient appears to be using this as needed.  Given that patient is only using it as needed and does not seem to have daily symptoms we will plan to refill alprazolam 0.25 mg #30 pills.  Patient discussed increasing dose however since 0.25 mg has worked for her in the past we will continue this.  Patient is  also on Vyvanse so we will not want to interact with this.  Patient to follow-up to ensure improvement.    Time spent during visit with patient: 15 minutes

## 2020-01-04 NOTE — Assessment & Plan Note (Signed)
Patient presenting with concerns for anxiety.  Reports ports that alprazolam has worked in the past.  [place] reviewed. Patient's last alprazolam refill was on 12/05/2018.  30 pills at that time.  Patient appears to be using this as needed.  Given that patient is only using it as needed and does not seem to have daily symptoms we will plan to refill alprazolam 0.25 mg #30 pills.  Patient discussed increasing dose however since 0.25 mg has worked for her in the past we will continue this.  Patient is also on Vyvanse so we will not want to interact with this.  Patient to follow-up to ensure improvement.

## 2020-01-05 ENCOUNTER — Encounter: Payer: Self-pay | Admitting: Family Medicine

## 2020-01-05 ENCOUNTER — Telehealth: Payer: Self-pay

## 2020-01-05 ENCOUNTER — Encounter: Payer: Self-pay | Admitting: Student in an Organized Health Care Education/Training Program

## 2020-01-05 LAB — HM DIABETES EYE EXAM

## 2020-01-05 NOTE — Telephone Encounter (Signed)
  Follow up Call-  Call back number 01/01/2020 12/31/2019  Post procedure Call Back phone  # [phone]  Permission to leave phone message Yes Yes  Some recent data might be hidden     Left message

## 2020-01-05 NOTE — Telephone Encounter (Signed)
LVM

## 2020-01-18 ENCOUNTER — Ambulatory Visit: Payer: BLUE CROSS/BLUE SHIELD | Admitting: Family Medicine

## 2020-02-08 ENCOUNTER — Telehealth: Payer: Self-pay | Admitting: Pharmacist

## 2020-02-08 NOTE — Telephone Encounter (Signed)
Patient Calls requesting Ozempic samples.  States all blood sugar readings are in the 100s.  She believes her A1c is improved more since last evaluation.   Medication Samples have been provided to the patient.  Drug name: Ozempic             Qty: 2 pens  LOT: [id number]  Exp.Date: 07/03/2022  Dosing instructions: 0.5mg  once weekly  The patient has been instructed regarding the correct time, dose, and frequency of taking this medication, including desired effects and most common side effects.   Tracy Weiss 11:26 AM 02/08/2020

## 2020-02-08 NOTE — Telephone Encounter (Signed)
Noted and agree. 

## 2020-02-29 ENCOUNTER — Telehealth (INDEPENDENT_AMBULATORY_CARE_PROVIDER_SITE_OTHER): Payer: Medicaid Other | Admitting: Family Medicine

## 2020-02-29 DIAGNOSIS — N939 Abnormal uterine and vaginal bleeding, unspecified: Secondary | ICD-10-CM

## 2020-02-29 NOTE — Telephone Encounter (Signed)
Patient want a nurse to call her back due to wanting to discuss getting a refill on meds, requesting a call because she cant remember the one she need refill ( said the one you have to take every 10 days.)

## 2020-02-29 NOTE — Telephone Encounter (Signed)
Called pt. Pt states she is having vaginal spotting. Reviewed medications with pt; pt states she began taking Megace that she has at home from a prior prescription this month. Per chart review pt was prescribed Provera PO in 12/20 and at visit with Kennon Rounds, MD on 12/03/19 was encouraged to continue. Reviewed this with patient, pt states the Provera was working to stop her bleeding. States she was unable to get a refill. I explained that she should have plenty of refills. Called pharmacy, staff member states there is no issue and they will get one ready for pick up today. Pt notified. Pt states she plans to discontinue the Megace and to start taking the Provera tomorrow for 10 days as prescribed. Pt will follow up with office as needed if bleeding continues.

## 2020-03-01 ENCOUNTER — Other Ambulatory Visit: Payer: Self-pay

## 2020-03-01 ENCOUNTER — Ambulatory Visit (INDEPENDENT_AMBULATORY_CARE_PROVIDER_SITE_OTHER): Payer: Self-pay | Admitting: Family Medicine

## 2020-03-01 VITALS — BP 108/60 | HR 87 | Ht 65.1 in | Wt 166.2 lb

## 2020-03-01 DIAGNOSIS — M25559 Pain in unspecified hip: Secondary | ICD-10-CM

## 2020-03-01 DIAGNOSIS — M25552 Pain in left hip: Secondary | ICD-10-CM

## 2020-03-01 DIAGNOSIS — E119 Type 2 diabetes mellitus without complications: Secondary | ICD-10-CM

## 2020-03-01 DIAGNOSIS — Z794 Long term (current) use of insulin: Secondary | ICD-10-CM

## 2020-03-01 HISTORY — DX: Pain in unspecified hip: M25.559

## 2020-03-01 MED ORDER — GABAPENTIN 100 MG PO CAPS: 200.0000 mg | ORAL_CAPSULE | Freq: Every day | ORAL | 0 refills | Status: DC

## 2020-03-01 NOTE — Patient Instructions (Addendum)
It was great to meet you today! Thank you for letting me participate in your care!  Today, we discussed your pain and it could be due to nerve irritation. I have prescribed you a medication that should help called Gabapentin. Continue your stretches and if you are not better in 2 weeks please return to clinic. If it gets worse don't wait and seek care sooner.  Be well, Harolyn Rutherford, DO PGY-3, [facility]

## 2020-03-01 NOTE — Assessment & Plan Note (Signed)
Unclear cause of pain, most likely from a muscle strain/sprain that could be causing nerve impingement. Unlikely to be slipped disc because she is not having back pain. Unlikely to be sciatica because her pain and numbness is all located on the lateral side and hip. Did consider nephrolithiasis, UTI, and pyelonephritis but no systemic symptoms and no urinary symptoms. Either way, this is a very unique presentation and distribution of pain and numbness. It seems to be within the lateral femoral cutaneous nerve distribution. - Cont stretching at home - Gabapentin 200mg  QHS before bed - F/u in 2 weeks if not better, seek care sooner if it gets worse

## 2020-03-01 NOTE — Progress Notes (Signed)
    SUBJECTIVE:   CHIEF COMPLAINT / HPI:   Back Pain with associated numbness and tingling Patient states for the past 2 weeks she has had a "iceberg like" pain in her left hip/back. It starts more on the lateral side of her hip and radiates down to the knee but stops above the knee. She reports no back pain, no fever, chills, urinary symptoms. She thought it was a pulled muscle at first but no longer thinks that is the case as now this feels different. Bending over seems to make it worse as well as laying down in certain positions as she says the pain is worse at night time. She also has some numbness and tingling associated with this pain and states sometimes she gets the same feeling in her hands if she lays on them. She elevated her feet last night and that seemed to help. Nothing else has seemed to make a difference. She is stretching at home. She denies any numbness or tingling in her feet.   PERTINENT  PMH / PSH: T2DM, HTN, IBS, Anxiety, PCOS  OBJECTIVE:   Pulse 87   Wt 166 lb 3.2 oz (75.4 kg)   SpO2 98%   BMI 30.89 kg/m    Gen: NAD MSK: Lumbar spine: - Inspection: no gross deformity or asymmetry, swelling or ecchymosis. No skin changes - Palpation: No TTP over the spinous processes, paraspinal muscles, or SI joints b/l - ROM: full active ROM of the lumbar spine in flexion and extension without pain - Strength: 5/5 strength of lower extremity in L4-S1 nerve root distributions b/l - Neuro: sensation intact in the L4-S1 nerve root distribution b/l, 2+ L4 and S1 reflexes Hip, Left: No obvious rash, erythema, ecchymosis, or edema. ROM full in all directions; Strength 5/5 in IR/ER/Flex/Ext/Abd/Add. Standing hip rotation and gait without trendelenburg / unsteadiness. Greater trochanter without tenderness to palpation. No tenderness over piriformis. No SI joint tenderness and normal minimal SI movement.   ASSESSMENT/PLAN:   Hip pain, acute Unclear cause of pain, most likely from a  muscle strain/sprain that could be causing nerve impingement. Unlikely to be slipped disc because she is not having back pain. Unlikely to be sciatica because her pain and numbness is all located on the lateral side and hip. Did consider nephrolithiasis, UTI, and pyelonephritis but no systemic symptoms and no urinary symptoms. Either way, this is a very unique presentation and distribution of pain and numbness. It seems to be within the lateral femoral cutaneous nerve distribution. - Cont stretching at home - Gabapentin 200mg  QHS before bed - F/u in 2 weeks if not better, seek care sooner if it gets worse     [place]a Alpha, [place]

## 2020-03-09 ENCOUNTER — Telehealth: Payer: Self-pay | Admitting: Student in an Organized Health Care Education/Training Program

## 2020-03-09 DIAGNOSIS — Z794 Long term (current) use of insulin: Secondary | ICD-10-CM

## 2020-03-09 DIAGNOSIS — E119 Type 2 diabetes mellitus without complications: Secondary | ICD-10-CM

## 2020-03-09 MED ORDER — NOVOLOG MIX 70/30 FLEXPEN (70-30) 100 UNIT/ML ~~LOC~~ SUPN: PEN_INJECTOR | SUBCUTANEOUS | 11 refills | Status: DC

## 2020-03-09 NOTE — Telephone Encounter (Signed)
Patient is calling and would like to have her Novalog Flexpen refilled. This needs to be sent to the [facility] on [address]. Patient states that this pharmacy is the cheapest for her.

## 2020-03-09 NOTE — Telephone Encounter (Signed)
Patient called and reported that she was almost out of her supply of "70/30 insulin" which she received from a friend several months ago.   She reports doing well with her weight loss plan (having lost ~ 20 pounds during the last few months).   We discussed options and we clarified that her desire was to stay on the Novolog Mix 70/30.  She requested a prescription for this insulin and I agreed to send this prescription to her pharmacy.   Patient also requested samples of Ozempic 0.5mg  dose weekly.  I agreed to supply 2 pens for pick-up.  Medication Samples have been provided to the patient.  Drug name: Robbie Louis: 2 pens  LOT: [id number]  Exp.Date: 07/03/2022  Dosing instructions: Inject  0.5mg  weekly  The patient has been instructed regarding the correct time, dose, and frequency of taking this medication, including desired effects and most common side effects.   Janeann Forehand 12:41 PM 03/09/2020

## 2020-03-09 NOTE — Telephone Encounter (Signed)
Reviewed and agree.

## 2020-03-14 ENCOUNTER — Telehealth (INDEPENDENT_AMBULATORY_CARE_PROVIDER_SITE_OTHER): Payer: Self-pay | Admitting: Family Medicine

## 2020-03-14 DIAGNOSIS — N939 Abnormal uterine and vaginal bleeding, unspecified: Secondary | ICD-10-CM

## 2020-03-14 NOTE — Telephone Encounter (Signed)
Patient called to say she wanted to get an ultrasound done so she can see where this bleeding is coming from. She wants a call back today because she said it takes too long to get a call back from the nurse.

## 2020-03-15 MED ORDER — MEDROXYPROGESTERONE ACETATE 10 MG PO TABS: 20.0000 mg | ORAL_TABLET | Freq: Every day | ORAL | 3 refills | Status: DC

## 2020-03-15 NOTE — Telephone Encounter (Signed)
Returned to patients call.   Patient voiced that she would like to have an Ultrasound as she is bleeding again. She reports she has had an ablation.   She took the Provera for the first 10 days of bleeding. She is now out of Provera. She has been bleeding for over a month. She is changing her pad with each bathroom visit. She reports the blood id pouring out as like she is voiding. She reports it drips out after shower also.   She is taking Provera every month as instructed.   She would like to know how to proceed and if she can get an ultrasound to determine source of bleeding. Chart routed to Dr. Kennon Rounds for evaluation and POC recommendations.

## 2020-03-15 NOTE — Telephone Encounter (Signed)
Scheduled OP Pelvic Ultrasound for 7/20 at 12:45 with Full bladder at [place].   Called patient to inform her Provera dosage changes and of [name] date and time. Patient has a follow up appt with Dr. Kennon Rounds on 8/16 and she prefers to keep that appt to follow up with Dr. Kennon Rounds. Patient to call the office if she has any issues, questions or concerns.

## 2020-03-15 NOTE — Addendum Note (Signed)
Addended by: Donn Pierini on: 03/15/2020 05:35 PM   Modules accepted: Orders

## 2020-03-15 NOTE — Addendum Note (Signed)
Addended by: Donnamae Jude on: 03/15/2020 04:04 PM   Modules accepted: Orders

## 2020-03-21 ENCOUNTER — Telehealth: Payer: No Typology Code available for payment source | Admitting: Psychiatry

## 2020-03-21 ENCOUNTER — Telehealth (INDEPENDENT_AMBULATORY_CARE_PROVIDER_SITE_OTHER): Payer: BLUE CROSS/BLUE SHIELD | Admitting: Psychiatry

## 2020-03-21 ENCOUNTER — Encounter (HOSPITAL_COMMUNITY): Payer: Self-pay | Admitting: Psychiatry

## 2020-03-21 ENCOUNTER — Other Ambulatory Visit: Payer: Self-pay

## 2020-03-21 DIAGNOSIS — F900 Attention-deficit hyperactivity disorder, predominantly inattentive type: Secondary | ICD-10-CM | POA: Diagnosis not present

## 2020-03-21 MED ORDER — LISDEXAMFETAMINE DIMESYLATE 70 MG PO CAPS: 70.0000 mg | ORAL_CAPSULE | Freq: Every morning | ORAL | 0 refills | Status: DC

## 2020-03-21 NOTE — Progress Notes (Signed)
BH MD/PA/NP OP Progress Note  Virtual Visit via Video Note  I connected with Tracy Weiss on 03/21/20 at  8:30 AM EDT by a video enabled telemedicine application and verified that I am speaking with the correct person using two identifiers.  Location: Patient: Home Provider: Clinic   I discussed the limitations of evaluation and management by telemedicine and the availability of in person appointments. The patient expressed understanding and agreed to proceed.  I provided 14 minutes of non-face-to-face time during this encounter.    03/21/2020 8:40 AM Tracy Weiss  MRN:  [id number]  Chief Complaint: " I am doing well."  HPI: Patient reported that she found increasing the dose of Vyvanse to 70 mg to be helpful.  She is doing better and balancing her work and school work.  She is able to focus well and has no difficulties in completing her tasks sometimes.  She informed that if she takes the Vyvanse later than 10 AM she has trouble sleeping at night and therefore she makes sure she takes Vyvanse by 8 AM every day.  She feels she is on the right track and has no issues with the dose at this point. She denied any other concerns or issues at this time.   Visit Diagnosis:    ICD-10-CM   1. Attention deficit hyperactivity disorder (ADHD), predominantly inattentive type  F90.0     Past Psychiatric History: ADHD  Past Medical History:  Past Medical History:  Diagnosis Date  . Abnormal uterine bleeding (AUB)   . Abnormal uterine bleeding (AUB)   . ADHD   . Allergy   . Anxiety   . Diabetes mellitus without complication ([place])    type 2  . Gallstones 09/2016  . GERD (gastroesophageal reflux disease)    after gallbladder removed  . Hyperlipidemia    per pt "taking as preventative"    Past Surgical History:  Procedure Laterality Date  . CESAREAN SECTION    . CHOLECYSTECTOMY N/A 09/11/2016   Procedure: LAPAROSCOPIC CHOLECYSTECTOMY WITH INTRAOPERATIVE CHOLANGIOGRAM;  Surgeon: Donnie Mesa,  MD;  Location: [facility];  Service: General;  Laterality: N/A;  . DILATATION AND CURETTAGE/HYSTEROSCOPY WITH MINERVA N/A 02/04/2019   Procedure: DILATATION AND CURETTAGE/ABLATION WITH MINERVA;  Surgeon: Emily Filbert, MD;  Location: [facility];  Service: Gynecology;  Laterality: N/A;  MINERVA ABLATION  . tubal ligaton reversal  2017    Family Psychiatric History: Family hx of ADHD in brothers, daughters and grandson  Family History:  Family History  Problem Relation Age of Onset  . Cancer Mother        Metastatic with Unknown Primary; Stomach was involved  . Diabetes Father   . Heart disease Father   . Breast cancer Neg Hx   . Colon cancer Neg Hx   . Esophageal cancer Neg Hx   . Rectal cancer Neg Hx   . Stomach cancer Neg Hx     Social History:  Social History   Socioeconomic History  . Marital status: Single    Spouse name: Not on file  . Number of children: Not on file  . Years of education: Not on file  . Highest education level: Master's degree (e.g., MA, MS, MEng, MEd, MSW, MBA)  Occupational History  . Not on file  Tobacco Use  . Smoking status: Never Smoker  . Smokeless tobacco: Never Used  Vaping Use  . Vaping Use: Never used  Substance and Sexual Activity  . Alcohol use: Yes  Alcohol/week: 0.0 standard drinks    Comment: rarely  . Drug use: No  . Sexual activity: Not Currently    Birth control/protection: None  Other Topics Concern  . Not on file  Social History Narrative  . Not on file   Social Determinants of Health   Financial Resource Strain:   . Difficulty of Paying Living Expenses:   Food Insecurity: Food Insecurity Present  . Worried About Charity fundraiser in the Last Year: Sometimes true  . Ran Out of Food in the Last Year: Sometimes true  Transportation Needs: No Transportation Needs  . Lack of Transportation (Medical): No  . Lack of Transportation (Non-Medical): No  Physical Activity:   . Days of Exercise per Week:   .  Minutes of Exercise per Session:   Stress:   . Feeling of Stress :   Social Connections:   . Frequency of Communication with Friends and Family:   . Frequency of Social Gatherings with Friends and Family:   . Attends Religious Services:   . Active Member of Clubs or Organizations:   . Attends Archivist Meetings:   Marland Kitchen Marital Status:     Allergies: No Known Allergies  Metabolic Disorder Labs: Lab Results  Component Value Date   HGBA1C 7.9 (A) 12/22/2019   Lab Results  Component Value Date   PROLACTIN 11.5 10/07/2018   PROLACTIN 10.3 06/11/2012   Lab Results  Component Value Date   CHOL 193 12/22/2019   TRIG 98 12/22/2019   HDL 56 12/22/2019   CHOLHDL 3.4 12/22/2019   VLDL 26 08/13/2016   LDLCALC 119 (H) 12/22/2019   LDLCALC 98 09/23/2017   Lab Results  Component Value Date   TSH 0.694 10/07/2018   TSH 1.080 09/23/2017    Therapeutic Level Labs: No results found for: LITHIUM No results found for: VALPROATE No components found for:  CBMZ  Current Medications: Current Outpatient Medications  Medication Sig Dispense Refill  . ALPRAZolam (XANAX) 0.25 MG tablet Take 1 tablet (0.25 mg total) by mouth at bedtime as needed for anxiety. 30 tablet 0  . aspirin 81 MG chewable tablet Chew by mouth daily.    . cycloSPORINE (RESTASIS) 0.05 % ophthalmic emulsion Place 1 drop into both eyes 2 (two) times daily as needed (dry eyes).    . gabapentin (NEURONTIN) 100 MG capsule Take 2 capsules (200 mg total) by mouth at bedtime. 60 capsule 0  . insulin aspart protamine - aspart (NOVOLOG MIX 70/30 FLEXPEN) (70-30) 100 UNIT/ML FlexPen Inject 0.5 mLs (50 Units total) into the skin daily with breakfast AND 0.4 mLs (40 Units total) daily with supper. 15 mL 11  . lisdexamfetamine (VYVANSE) 70 MG capsule Take 1 capsule (70 mg total) by mouth in the morning. 30 capsule 0  . medroxyPROGESTERone (PROVERA) 10 MG tablet Take 2 tablets (20 mg total) by mouth daily. May return to one pill  daily when bleeding stops. 180 tablet 3  . metFORMIN (GLUCOPHAGE) 1000 MG tablet Take 1 tablet (1,000 mg total) by mouth 2 (two) times daily with a meal. 180 tablet 3  . olmesartan-hydrochlorothiazide (BENICAR HCT) 40-25 MG tablet Take 1 tablet by mouth daily. 90 tablet 1  . Prenatal Vit-Fe Fumarate-FA (PRENATAL VITAMIN PO) Take 1 tablet by mouth.    . rosuvastatin (CRESTOR) 20 MG tablet Take 1 tablet (20 mg total) by mouth daily. 90 tablet 1  . Semaglutide,0.25 or 0.5MG /DOS, (OZEMPIC, 0.25 OR 0.5 MG/DOSE,) 2 MG/1.5ML SOPN Inject 0.25 mg into the  skin once a week. (Patient taking differently: Inject 0.5 mg into the skin once a week. ) 1 pen 0   No current facility-administered medications for this visit.      Psychiatric Specialty Exam: Review of Systems  There were no vitals taken for this visit.There is no height or weight on file to calculate BMI.  General Appearance: Fairly groomed  Eye Contact: good  Speech:  Clear and Coherent and Normal Rate  Volume:  Normal  Mood:  Euthymic  Affect:  Congruent  Thought Process:  Goal Directed, Linear and Descriptions of Associations: Intact  Orientation:  Full (Time, Place, and Person)  Thought Content: Logical   Suicidal Thoughts:  No  Homicidal Thoughts:  No  Memory:  Recent;   Good Remote;   Good  Judgement:  Good  Insight:  Good  Psychomotor Activity:  Normal  Concentration:  Concentration: Good and Attention Span: Good  Recall:  Good  Fund of Knowledge: Good  Language: Good  Akathisia:  Negative  Handed:  Right  AIMS (if indicated): not done  Assets:  Communication Skills Desire for Improvement Financial Resources/Insurance Housing  ADL's:  Intact  Cognition: WNL  Sleep:  Good   Screenings: GAD-7     Office Visit from 12/03/2019 in [facility] for [facility]  Total GAD-7 Score 7    PHQ2-9     Office Visit from 03/01/2020 in [facility] Office Visit from 12/22/2019 in [facility] Office Visit from 12/03/2019 in [facility] for [facility] Office Visit from 11/06/2019 in [facility] Office Visit from 08/17/2019 in [facility]  PHQ-2 Total Score 2 0 3 2 0  PHQ-9 Total Score -- -- 10 -- --       Assessment and Plan: Patient appears to be stable on her current regimen.  1. Attention deficit hyperactivity disorder (ADHD), predominantly inattentive type  -  lisdexamfetamine (VYVANSE) 70 MG capsule; Take 1 capsule (70 mg total) by mouth in the morning.  Dispense: 30 capsule; Refill: 0 - lisdexamfetamine (VYVANSE) 70 MG capsule; Take 1 capsule (70 mg total) by mouth in the morning.  Dispense: 30 capsule; Refill: 0 - lisdexamfetamine (VYVANSE) 70 MG capsule; Take 1 capsule (70 mg total) by mouth in the morning.  Dispense: 30 capsule; Refill: 0   F/up in 3 months.  N[place] Crane, MD 03/21/2020, 8:40 AM

## 2020-03-22 ENCOUNTER — Other Ambulatory Visit: Payer: Self-pay

## 2020-03-22 ENCOUNTER — Ambulatory Visit
Admission: RE | Admit: 2020-03-22 | Discharge: 2020-03-22 | Disposition: A | Discharge status: Home / Self Care | Payer: BLUE CROSS/BLUE SHIELD | Source: Ambulatory Visit | Attending: Family Medicine | Admitting: Family Medicine

## 2020-03-22 DIAGNOSIS — N939 Abnormal uterine and vaginal bleeding, unspecified: Secondary | ICD-10-CM | POA: Diagnosis not present

## 2020-03-23 NOTE — Telephone Encounter (Signed)
Error. Tracy Weiss, CMA  

## 2020-03-24 ENCOUNTER — Telehealth: Payer: Self-pay | Admitting: Family Medicine

## 2020-03-24 NOTE — Telephone Encounter (Signed)
Calling for [name] Results

## 2020-03-25 ENCOUNTER — Telehealth (INDEPENDENT_AMBULATORY_CARE_PROVIDER_SITE_OTHER): Payer: Self-pay | Admitting: Lactation Services

## 2020-03-25 DIAGNOSIS — D250 Submucous leiomyoma of uterus: Secondary | ICD-10-CM

## 2020-03-25 DIAGNOSIS — R9389 Abnormal findings on diagnostic imaging of other specified body structures: Secondary | ICD-10-CM

## 2020-03-25 DIAGNOSIS — N939 Abnormal uterine and vaginal bleeding, unspecified: Secondary | ICD-10-CM

## 2020-03-25 NOTE — Telephone Encounter (Signed)
-----   Message from Osborne Oman, MD sent at 03/24/2020 12:19 PM EDT ----- Has 4.7 cm submucosal fibroid and thickened endometrium stripe. Needs visit with Dr. Kennon Rounds to get endometrial biopsy since she is still having AUB and discuss further management.  Please call to inform patient of results and recommendations.

## 2020-03-25 NOTE — Telephone Encounter (Signed)
Called patient, see telephone encounter

## 2020-03-25 NOTE — Telephone Encounter (Signed)
Called Tracy Weiss to give her there results of her Ultrasound and recommendation for Endometrial Biopsy. Tracy Weiss voiced she had an endometrial biopsy last year and was informed it was normal.   Tracy Weiss has an appt on 8/16 with Dr. Kennon Rounds. She is requesting an earlier appt or to see another provider and to discuss plan of care. Discussed we generally like to keep you with the same provider. She asked to see Dr. Hulan Fray, informed her that Dr. Hulan Fray has retired.   Will forward message to Dr. Kennon Rounds.

## 2020-03-29 DIAGNOSIS — D250 Submucous leiomyoma of uterus: Secondary | ICD-10-CM

## 2020-03-31 ENCOUNTER — Emergency Department (HOSPITAL_BASED_OUTPATIENT_CLINIC_OR_DEPARTMENT_OTHER)
Admission: EM | Admit: 2020-03-31 | Discharge: 2020-03-31 | Disposition: A | Discharge status: Home / Self Care | Payer: BLUE CROSS/BLUE SHIELD | Attending: Emergency Medicine | Admitting: Emergency Medicine

## 2020-03-31 ENCOUNTER — Other Ambulatory Visit: Payer: Self-pay

## 2020-03-31 ENCOUNTER — Encounter (HOSPITAL_BASED_OUTPATIENT_CLINIC_OR_DEPARTMENT_OTHER): Payer: Self-pay | Admitting: Emergency Medicine

## 2020-03-31 ENCOUNTER — Emergency Department (HOSPITAL_BASED_OUTPATIENT_CLINIC_OR_DEPARTMENT_OTHER): Payer: BLUE CROSS/BLUE SHIELD

## 2020-03-31 DIAGNOSIS — R197 Diarrhea, unspecified: Secondary | ICD-10-CM | POA: Diagnosis not present

## 2020-03-31 DIAGNOSIS — Z794 Long term (current) use of insulin: Secondary | ICD-10-CM | POA: Diagnosis not present

## 2020-03-31 DIAGNOSIS — R1013 Epigastric pain: Secondary | ICD-10-CM | POA: Diagnosis not present

## 2020-03-31 DIAGNOSIS — F909 Attention-deficit hyperactivity disorder, unspecified type: Secondary | ICD-10-CM | POA: Insufficient documentation

## 2020-03-31 DIAGNOSIS — E119 Type 2 diabetes mellitus without complications: Secondary | ICD-10-CM | POA: Diagnosis not present

## 2020-03-31 DIAGNOSIS — Z7982 Long term (current) use of aspirin: Secondary | ICD-10-CM | POA: Diagnosis not present

## 2020-03-31 DIAGNOSIS — R110 Nausea: Secondary | ICD-10-CM | POA: Insufficient documentation

## 2020-03-31 DIAGNOSIS — Z79899 Other long term (current) drug therapy: Secondary | ICD-10-CM | POA: Insufficient documentation

## 2020-03-31 LAB — CBC WITH DIFFERENTIAL/PLATELET
Abs Immature Granulocytes: 0 10*3/uL (ref 0.00–0.07)
Basophils Absolute: 0.1 10*3/uL (ref 0.0–0.1)
Basophils Relative: 1 %
Eosinophils Absolute: 0.1 10*3/uL (ref 0.0–0.5)
Eosinophils Relative: 1 %
HCT: 38 % (ref 36.0–46.0)
Hemoglobin: 12.6 g/dL (ref 12.0–15.0)
Immature Granulocytes: 0 %
Lymphocytes Relative: 44 %
Lymphs Abs: 2 10*3/uL (ref 0.7–4.0)
MCH: 30.1 pg (ref 26.0–34.0)
MCHC: 33.2 g/dL (ref 30.0–36.0)
MCV: 90.9 fL (ref 80.0–100.0)
Monocytes Absolute: 0.4 10*3/uL (ref 0.1–1.0)
Monocytes Relative: 8 %
Neutro Abs: 2.2 10*3/uL (ref 1.7–7.7)
Neutrophils Relative %: 46 %
Platelets: 343 10*3/uL (ref 150–400)
RBC: 4.18 MIL/uL (ref 3.87–5.11)
RDW: 12.8 % (ref 11.5–15.5)
WBC: 4.7 10*3/uL (ref 4.0–10.5)
nRBC: 0 % (ref 0.0–0.2)

## 2020-03-31 LAB — COMPREHENSIVE METABOLIC PANEL
ALT: 12 U/L (ref 0–44)
AST: 14 U/L — ABNORMAL LOW (ref 15–41)
Albumin: 3.8 g/dL (ref 3.5–5.0)
Alkaline Phosphatase: 51 U/L (ref 38–126)
Anion gap: 10 (ref 5–15)
BUN: 10 mg/dL (ref 6–20)
CO2: 30 mmol/L (ref 22–32)
Calcium: 9.2 mg/dL (ref 8.9–10.3)
Chloride: 101 mmol/L (ref 98–111)
Creatinine, Ser: 0.75 mg/dL (ref 0.44–1.00)
GFR calc Af Amer: >60 mL/min (ref >60)
GFR calc non Af Amer: >60 mL/min (ref >60)
Glucose, Bld: 151 mg/dL — ABNORMAL HIGH (ref 70–99)
Potassium: 3.9 mmol/L (ref 3.5–5.1)
Sodium: 141 mmol/L (ref 135–145)
Total Bilirubin: 0.4 mg/dL (ref 0.3–1.2)
Total Protein: 6.9 g/dL (ref 6.5–8.1)

## 2020-03-31 LAB — URINALYSIS, MICROSCOPIC (REFLEX)

## 2020-03-31 LAB — URINALYSIS, ROUTINE W REFLEX MICROSCOPIC
Bilirubin Urine: NEGATIVE
Glucose, UA: NEGATIVE mg/dL
Ketones, ur: NEGATIVE mg/dL
Leukocytes,Ua: NEGATIVE
Nitrite: NEGATIVE
Protein, ur: 100 mg/dL — AB
Specific Gravity, Urine: >1.03 — ABNORMAL HIGH (ref 1.005–1.030)
pH: 6 (ref 5.0–8.0)

## 2020-03-31 LAB — LIPASE, BLOOD: Lipase: 26 U/L (ref 11–51)

## 2020-03-31 MED ORDER — FAMOTIDINE 20 MG PO TABS
20.0000 mg | ORAL_TABLET | Freq: Once | ORAL | Status: AC
  Administered 2020-03-31: 20 mg via ORAL
  Filled 2020-03-31: qty 1

## 2020-03-31 MED ORDER — FAMOTIDINE 20 MG PO TABS
20.0000 mg | ORAL_TABLET | Freq: Two times a day (BID) | ORAL | 0 refills | Status: DC
Start: 2020-03-31 — End: 2020-12-01

## 2020-03-31 MED ORDER — IOHEXOL 300 MG/ML  SOLN
100.0000 mL | Freq: Once | INTRAMUSCULAR | Status: AC | PRN
  Administered 2020-03-31: 100 mL via INTRAVENOUS

## 2020-03-31 MED ORDER — ALUM & MAG HYDROXIDE-SIMETH 200-200-20 MG/5ML PO SUSP
30.0000 mL | Freq: Once | ORAL | Status: AC
  Administered 2020-03-31: 30 mL via ORAL
  Filled 2020-03-31: qty 30

## 2020-03-31 NOTE — ED Triage Notes (Signed)
Pt c/o sharp epigastric pain with nausea and diarrhea x 4 days.

## 2020-03-31 NOTE — ED Provider Notes (Signed)
[facility] EMERGENCY DEPARTMENT Provider Note   CSN: [id number] Arrival date & time: 03/31/20  [id number]     History Chief Complaint  Patient presents with  . Abdominal Pain    Tracy Weiss is a 54 y.o. female with history of high cholesterol, type 2 diabetes, reflux, cholecystectomy (Jan 2018), abnormal uterine bleeding, presented emergency department epigastric pain.  Patient reports abrupt onset of sharp, stabbing epigastric pain approximately 3 days ago.  She says the pain has been constant since then.  It occasionally radiates into her mid to lower abdomen, particularly with palpation.  Nothing makes it better or worse.   She has never experienced this kind of pain before.  She says she feels generally nauseated.  She has not had any vomiting.  She did have several loose bowel movements yesterday.  She has had no diarrhea today.  She denies fevers or chills.  She says the pain that she is currently feeling is 9 out of 10 in intensity and feels like her biliary colic when she had an inflamed gallbladder 3 years ago (GB removed at that time).  She denies any history of pancreatitis or recent alcohol drinking.  She denies any lower pelvic pain or active abnormal uterine bleeding.  She denies any history of kidney stones, dysuria, hematuria.  She does report a history of acid reflux and indigestion in the past.  She is not sure if this feels similar to that.  She has not tried taking any medications for her pain for the past 3 days.  Last colonoscopy was on 01/01/20 and per report was normal.  HPI     Past Medical History:  Diagnosis Date  . Abnormal uterine bleeding (AUB)   . Abnormal uterine bleeding (AUB)   . ADHD   . Allergy   . Anxiety   . Diabetes mellitus without complication ([place])    type 2  . Gallstones 09/2016  . GERD (gastroesophageal reflux disease)    after gallbladder removed  . Hyperlipidemia    per pt "taking as preventative"    Patient Active  Problem List   Diagnosis Date Noted  . Hip pain, acute 03/01/2020  . Incontinence 10/08/2019  . Healthcare maintenance 09/17/2019  . Toe injury 08/17/2019  . Pt intentl undrdose of meds regimen due to financl hardship 02/25/2019  . Left atrial enlargement 02/23/2019  . Abnormal uterine bleeding 09/26/2018  . Cholecystitis 09/10/2016  . History of reversal of tubal ligation 04/03/2016  . Generalized anxiety disorder 03/30/2015  . Dysuria 03/31/2013  . PCOS (polycystic ovarian syndrome) 06/25/2012  . Hyperlipidemia 10/12/2011  . IBS (irritable bowel syndrome) 12/29/2010  . Essential hypertension, benign 07/07/2009  . OBESITY 04/13/2009  . Type 2 diabetes mellitus without complication, with long-term current use of insulin ([place]) 12/17/2006  . Anxiety 10/31/2006  . Attention deficit hyperactivity disorder (ADHD), predominantly inattentive type 10/31/2006    Past Surgical History:  Procedure Laterality Date  . CESAREAN SECTION    . CHOLECYSTECTOMY N/A 09/11/2016   Procedure: LAPAROSCOPIC CHOLECYSTECTOMY WITH INTRAOPERATIVE CHOLANGIOGRAM;  Surgeon: Donnie Mesa, MD;  Location: [place];  Service: General;  Laterality: N/A;  . DILATATION AND CURETTAGE/HYSTEROSCOPY WITH MINERVA N/A 02/04/2019   Procedure: DILATATION AND CURETTAGE/ABLATION WITH MINERVA;  Surgeon: Emily Filbert, MD;  Location: [facility];  Service: Gynecology;  Laterality: N/A;  MINERVA ABLATION  . tubal ligaton reversal  2017     OB History    Gravida  1   Para  Term      Preterm      AB      Living  2     SAB      TAB      Ectopic      Multiple  1   Live Births  2           Family History  Problem Relation Age of Onset  . Cancer Mother        Metastatic with Unknown Primary; Stomach was involved  . Diabetes Father   . Heart disease Father   . Breast cancer Neg Hx   . Colon cancer Neg Hx   . Esophageal cancer Neg Hx   . Rectal cancer Neg Hx   . Stomach cancer Neg Hx      Social History   Tobacco Use  . Smoking status: Never Smoker  . Smokeless tobacco: Never Used  Vaping Use  . Vaping Use: Never used  Substance Use Topics  . Alcohol use: Yes    Alcohol/week: 0.0 standard drinks    Comment: rarely  . Drug use: No    Home Medications Prior to Admission medications   Medication Sig Start Date End Date Taking? Authorizing Provider  ALPRAZolam (XANAX) 0.25 MG tablet Take 1 tablet (0.25 mg total) by mouth at bedtime as needed for anxiety. 01/04/20  Yes Caroline More, DO  aspirin 81 MG chewable tablet Chew by mouth daily.   Yes [provider]  cycloSPORINE (RESTASIS) 0.05 % ophthalmic emulsion Place 1 drop into both eyes 2 (two) times daily as needed (dry eyes).   Yes [provider]  insulin aspart protamine - aspart (NOVOLOG MIX 70/30 FLEXPEN) (70-30) 100 UNIT/ML FlexPen Inject 0.5 mLs (50 Units total) into the skin daily with breakfast AND 0.4 mLs (40 Units total) daily with supper. 03/09/20  Yes Leavy Cella, RPH-CPP  lisdexamfetamine (VYVANSE) 70 MG capsule Take 1 capsule (70 mg total) by mouth in the morning. 03/21/20  Yes Nevada Crane, MD  lisdexamfetamine (VYVANSE) 70 MG capsule Take 1 capsule (70 mg total) by mouth in the morning. 04/20/20  Yes [place] Crane, MD  lisdexamfetamine (VYVANSE) 70 MG capsule Take 1 capsule (70 mg total) by mouth in the morning. 05/20/20  Yes [place] Crane, MD  medroxyPROGESTERone (PROVERA) 10 MG tablet Take 2 tablets (20 mg total) by mouth daily. May return to one pill daily when bleeding stops. 03/15/20  Yes Donnamae Jude, MD  olmesartan-hydrochlorothiazide (BENICAR HCT) 40-25 MG tablet Take 1 tablet by mouth daily. 12/22/19  Yes Kinnie Feil, MD  Prenatal Vit-Fe Fumarate-FA (PRENATAL VITAMIN PO) Take 1 tablet by mouth.   Yes [provider]  rosuvastatin (CRESTOR) 20 MG tablet Take 1 tablet (20 mg total) by mouth daily. 12/23/19  Yes Kinnie Feil, MD  Semaglutide,0.25 or 0.5MG /DOS,  (OZEMPIC, 0.25 OR 0.5 MG/DOSE,) 2 MG/1.5ML SOPN Inject 0.25 mg into the skin once a week. Patient taking differently: Inject 0.5 mg into the skin once a week.  09/01/19  Yes Tammi Klippel, Sherin, DO  famotidine (PEPCID) 20 MG tablet Take 1 tablet (20 mg total) by mouth 2 (two) times daily. 03/31/20 04/30/20  Wyvonnia Dusky, MD    Allergies    Patient has no known allergies.  Review of Systems   Review of Systems  Constitutional: Negative for chills and fever.  HENT: Negative for ear pain and sore throat.   Eyes: Negative for pain and visual disturbance.  Respiratory: Negative for cough and  shortness of breath.   Cardiovascular: Negative for chest pain and palpitations.  Gastrointestinal: Positive for abdominal pain, diarrhea and nausea. Negative for blood in stool, constipation and vomiting.  Genitourinary: Negative for dysuria, flank pain and hematuria.  Musculoskeletal: Negative for arthralgias and back pain.  Skin: Negative for color change and rash.  Neurological: Negative for syncope and headaches.  Psychiatric/Behavioral: Negative for agitation and confusion.  All other systems reviewed and are negative.   Physical Exam Updated Vital Signs BP (!) 117/64 (BP Location: Left Arm)   Pulse 82   Temp 98.1 F (36.7 C) (Oral)   Resp 16   Ht 5\' 1"  (1.549 m)   Wt 75.3 kg   SpO2 99%   BMI 31.37 kg/m   Physical Exam Vitals and nursing note reviewed.  Constitutional:      General: She is not in acute distress.    Appearance: She is well-developed.  HENT:     Head: Normocephalic and atraumatic.  Eyes:     Conjunctiva/sclera: Conjunctivae normal.  Cardiovascular:     Rate and Rhythm: Normal rate and regular rhythm.     Heart sounds: No murmur heard.   Pulmonary:     Effort: Pulmonary effort is normal. No respiratory distress.     Breath sounds: Normal breath sounds.  Abdominal:     Palpations: Abdomen is soft.     Tenderness: There is abdominal tenderness in the epigastric  area. There is no right CVA tenderness, left CVA tenderness, guarding or rebound. Negative signs include McBurney's sign.     Hernia: No hernia is present.  Musculoskeletal:     Cervical back: Neck supple.  Skin:    General: Skin is warm and dry.     Capillary Refill: Capillary refill takes less than 2 seconds.  Neurological:     General: No focal deficit present.     Mental Status: She is alert and oriented to person, place, and time.  Psychiatric:        Mood and Affect: Mood normal.        Behavior: Behavior normal.     ED Results / Procedures / Treatments   Labs (all labs ordered are listed, but only abnormal results are displayed) Labs Reviewed  COMPREHENSIVE METABOLIC PANEL - Abnormal; Notable for the following components:      Result Value   Glucose, Bld 151 (*)    AST 14 (*)    All other components within normal limits  URINALYSIS, ROUTINE W REFLEX MICROSCOPIC - Abnormal; Notable for the following components:   APPearance CLOUDY (*)    Specific Gravity, Urine >1.030 (*)    Hgb urine dipstick LARGE (*)    Protein, ur 100 (*)    All other components within normal limits  URINALYSIS, MICROSCOPIC (REFLEX) - Abnormal; Notable for the following components:   Bacteria, UA MANY (*)    All other components within normal limits  CBC WITH DIFFERENTIAL/PLATELET  LIPASE, BLOOD    EKG None  Radiology CT ABDOMEN PELVIS W CONTRAST  Result Date: 03/31/2020 CLINICAL DATA:  Epigastric pain with nausea and diarrhea. EXAM: CT ABDOMEN AND PELVIS WITH CONTRAST TECHNIQUE: Multidetector CT imaging of the abdomen and pelvis was performed using the standard protocol following bolus administration of intravenous contrast. CONTRAST:  144mL OMNIPAQUE IOHEXOL 300 MG/ML  SOLN COMPARISON:  None. FINDINGS: Lower chest: No acute abnormality. Hepatobiliary: No focal liver abnormality is seen. Status post cholecystectomy. No biliary dilatation. Pancreas: Unremarkable. No pancreatic ductal dilatation  or surrounding inflammatory  changes. Spleen: Normal in size without focal abnormality. Adrenals/Urinary Tract: Adrenal glands are unremarkable. Kidneys are normal, without renal calculi, focal lesion, or hydronephrosis. The urinary bladder is empty and otherwise normal in appearance. Stomach/Bowel: Stomach is within normal limits. Appendix appears normal. No evidence of bowel wall thickening, distention, or inflammatory changes. Vascular/Lymphatic: No significant vascular findings are present. No enlarged abdominal or pelvic lymph nodes. Reproductive: The uterus is mildly enlarged and slightly heterogeneous in appearance. A 3.0 cm x 1.8 cm cyst is seen along the anterior aspect of the left adnexa. Other: No abdominal wall hernia or abnormality. No abdominopelvic ascites. Musculoskeletal: No acute or significant osseous findings. IMPRESSION: 1. Evidence of prior cholecystectomy. 2. 3.0 cm x 1.8 cm left adnexal cyst, likely ovarian in origin. Electronically Signed   By: Virgina Norfolk M.D.   On: 03/31/2020 08:33    Procedures Procedures (including critical care time)  Medications Ordered in ED Medications  alum & mag hydroxide-simeth (MAALOX/MYLANTA) 200-200-20 MG/5ML suspension 30 mL (30 mLs Oral Given 03/31/20 0711)  famotidine (PEPCID) tablet 20 mg (20 mg Oral Given 03/31/20 0711)  iohexol (OMNIPAQUE) 300 MG/ML solution 100 mL (100 mLs Intravenous Contrast Given 03/31/20 2595)    ED Course  I have reviewed the triage vital signs and the nursing notes.  Pertinent labs & imaging results that were available during my care of the patient were reviewed by me and considered in my medical decision making (see chart for details).  This is a well-appearing 54 year old female presenting to the ED with 3 days of sudden onset, sharp epigastric pain.  This was associated with some loose bowel movements yesterday.  Differential diagnosis includes pancreatitis vs gastritis vs peptic ulcer vs reflux vs kidney  stone vs other  Low suspicion for mesenteric ischemic or AAA without risk factors and with this overall benign clinical exam  Last colonoscopy was on 01/01/20 and per report was normal.  Less likely diverticulitis without known diverticular disease reported.  I personally reviewed her prior medical records including hx of lap cholecystectomy in Jan 2018 and last colonoscopy from April 2021  Plan to check CMP, Lipase, CBC with diff, UA Try GI cocktail and pepcid for possible gastritis Plan for CT abdomen/pelvis with IV contrast to look for possible pancreatitis vs kidney stone  Labs personally reviewed - no acute findings to suggest pancreatitis, infection, or liver disease, kidney stone, or UTI  Clinical Course as of Mar 31 1732  Thu Mar 31, 2020  [id number] Reviewed labs and CT with patient.  No acute findings to explain her symptoms.  I have a suspicion this may be gastritis or a gastric ulcer.  I'll prescribe pepcid and offered a GI referral which she was glad to take.  She felt like her symptoms had minor improvement after GI medications but came back.   [MT]    Clinical Course User Index [MT] Isiaah Cuervo, Carola Rhine, MD    Final Clinical Impression(s) / ED Diagnoses Final diagnoses:  Epigastric pain    Rx / DC Orders ED Discharge Orders         Ordered    Ambulatory referral to Gastroenterology     Discontinue  Reprint    Comments: Seen in ER for sharp epigastric pain.  No focal findings on CT.  Suspicious of gastric ulcer or gastritis, recommended follow up with GI team.   03/31/20 0843    famotidine (PEPCID) 20 MG tablet  2 times daily     Discontinue  Reprint  03/31/20 [id number]           Wyvonnia Dusky, MD 03/31/20 1733

## 2020-03-31 NOTE — ED Notes (Signed)
IV attempted x1 without success

## 2020-03-31 NOTE — ED Notes (Signed)
Pt states she hates needles and does not want an IV. Explained to pt that there is a CT scan ordered and an IV is needed for contrast. She verbalized understanding.

## 2020-04-05 ENCOUNTER — Other Ambulatory Visit: Payer: Self-pay | Admitting: Family Medicine

## 2020-04-05 DIAGNOSIS — D250 Submucous leiomyoma of uterus: Secondary | ICD-10-CM

## 2020-04-07 ENCOUNTER — Ambulatory Visit
Admission: RE | Admit: 2020-04-07 | Discharge: 2020-04-07 | Disposition: A | Discharge status: Home / Self Care | Payer: No Typology Code available for payment source | Source: Ambulatory Visit | Attending: Family Medicine | Admitting: Family Medicine

## 2020-04-07 ENCOUNTER — Other Ambulatory Visit: Payer: Self-pay

## 2020-04-07 ENCOUNTER — Encounter: Payer: Self-pay | Admitting: *Deleted

## 2020-04-07 DIAGNOSIS — D250 Submucous leiomyoma of uterus: Secondary | ICD-10-CM

## 2020-04-07 HISTORY — PX: IR RADIOLOGIST EVAL & MGMT: IMG5224

## 2020-04-07 NOTE — Consult Note (Signed)
Chief Complaint:  Abnormal uterine bleeding  Referring Physician(s): Pratt,Tanya S  History of Present Illness: Tracy Weiss is a 54 y.o. female with a history of type 2 diabetes, reflux disease, hyperlipidemia, and abnormal uterine bleeding.  She is referred for uterine fibroid embolization consultation.  She currently has no menopausal type symptoms.  No future pregnancy plans.  Prior to 2019, she had a fairly regular cycle every 28 days with 3 heavy days and no passes of blood clots.  In 2019 her symptoms of abnormal uterine bleeding worsened.  At one point she was continuously bleeding for nearly a month with passage of blood clots.  In June 2020, she underwent D&C with ablation followed by hormone replacement therapies.  The bleeding has improved but she does have minimal residual daily mucus to pink blood-tinged vaginal discharge.  This requires constant pad use for protection.  Bleeding symptoms at one point did result in iron deficiency anemia.  She also has bulk related symptoms with urinary frequency and urgency.  This was evaluated by urology with no specific issues demonstrated.  She also has symptoms of mild constipation which may be related to the enlarged fibroid uterus.  No recent GYN infections.  Pap smear 04/01/2019 -.  Endometrial biopsy 02/04/2019 -.  Ultrasound 03/22/2020 demonstrated mild enlarged uterus measuring 12 cm in length with a 5 cm submucosal mid uterine body fibroid.  In addition, the uterus looks very heterogeneous and thickened by ultrasound suggesting adenomyosis.  Past Medical History:  Diagnosis Date  . Abnormal uterine bleeding (AUB)   . Abnormal uterine bleeding (AUB)   . ADHD   . Allergy   . Anxiety   . Diabetes mellitus without complication ([place])    type 2  . Gallstones 09/2016  . GERD (gastroesophageal reflux disease)    after gallbladder removed  . Hyperlipidemia    per pt "taking as preventative"    Past Surgical History:  Procedure  Laterality Date  . CESAREAN SECTION    . CHOLECYSTECTOMY N/A 09/11/2016   Procedure: LAPAROSCOPIC CHOLECYSTECTOMY WITH INTRAOPERATIVE CHOLANGIOGRAM;  Surgeon: Donnie Mesa, MD;  Location: [facility];  Service: General;  Laterality: N/A;  . DILATATION AND CURETTAGE/HYSTEROSCOPY WITH MINERVA N/A 02/04/2019   Procedure: DILATATION AND CURETTAGE/ABLATION WITH MINERVA;  Surgeon: Emily Filbert, MD;  Location: [facility];  Service: Gynecology;  Laterality: N/A;  MINERVA ABLATION  . tubal ligaton reversal  2017    Allergies: Patient has no known allergies.  Medications: Prior to Admission medications   Medication Sig Start Date End Date Taking? Authorizing Provider  ALPRAZolam (XANAX) 0.25 MG tablet Take 1 tablet (0.25 mg total) by mouth at bedtime as needed for anxiety. 01/04/20   Caroline More, DO  aspirin 81 MG chewable tablet Chew by mouth daily.    [provider]  cycloSPORINE (RESTASIS) 0.05 % ophthalmic emulsion Place 1 drop into both eyes 2 (two) times daily as needed (dry eyes).    [provider]  famotidine (PEPCID) 20 MG tablet Take 1 tablet (20 mg total) by mouth 2 (two) times daily. 03/31/20 04/30/20  Wyvonnia Dusky, MD  insulin aspart protamine - aspart (NOVOLOG MIX 70/30 FLEXPEN) (70-30) 100 UNIT/ML FlexPen Inject 0.5 mLs (50 Units total) into the skin daily with breakfast AND 0.4 mLs (40 Units total) daily with supper. 03/09/20   Leavy Cella, RPH-CPP  lisdexamfetamine (VYVANSE) 70 MG capsule Take 1 capsule (70 mg total) by mouth in the morning. 03/21/20  Nevada Crane, MD  lisdexamfetamine (VYVANSE) 70 MG capsule Take 1 capsule (70 mg total) by mouth in the morning. 04/20/20   Nevada Crane, MD  lisdexamfetamine (VYVANSE) 70 MG capsule Take 1 capsule (70 mg total) by mouth in the morning. 05/20/20   Nevada Crane, MD  medroxyPROGESTERone (PROVERA) 10 MG tablet Take 2 tablets (20 mg total) by mouth daily. May return to one pill daily when bleeding stops.  03/15/20   Donnamae Jude, MD  olmesartan-hydrochlorothiazide (BENICAR HCT) 40-25 MG tablet Take 1 tablet by mouth daily. 12/22/19   Kinnie Feil, MD  Prenatal Vit-Fe Fumarate-FA (PRENATAL VITAMIN PO) Take 1 tablet by mouth.    [provider]  rosuvastatin (CRESTOR) 20 MG tablet Take 1 tablet (20 mg total) by mouth daily. 12/23/19   Kinnie Feil, MD  Semaglutide,0.25 or 0.5MG /DOS, (OZEMPIC, 0.25 OR 0.5 MG/DOSE,) 2 MG/1.5ML SOPN Inject 0.25 mg into the skin once a week. Patient taking differently: Inject 0.5 mg into the skin once a week.  09/01/19   Caroline More, DO     Family History  Problem Relation Age of Onset  . Cancer Mother        Metastatic with Unknown Primary; Stomach was involved  . Diabetes Father   . Heart disease Father   . Breast cancer Neg Hx   . Colon cancer Neg Hx   . Esophageal cancer Neg Hx   . Rectal cancer Neg Hx   . Stomach cancer Neg Hx     Social History   Socioeconomic History  . Marital status: Single    Spouse name: Not on file  . Number of children: Not on file  . Years of education: Not on file  . Highest education level: Master's degree (e.g., MA, MS, MEng, MEd, MSW, MBA)  Occupational History  . Not on file  Tobacco Use  . Smoking status: Never Smoker  . Smokeless tobacco: Never Used  Vaping Use  . Vaping Use: Never used  Substance and Sexual Activity  . Alcohol use: Yes    Alcohol/week: 0.0 standard drinks    Comment: rarely  . Drug use: No  . Sexual activity: Not Currently    Birth control/protection: None  Other Topics Concern  . Not on file  Social History Narrative  . Not on file   Social Determinants of Health   Financial Resource Strain:   . Difficulty of Paying Living Expenses:   Food Insecurity: Food Insecurity Present  . Worried About Charity fundraiser in the Last Year: Sometimes true  . Ran Out of Food in the Last Year: Sometimes true  Transportation Needs: No Transportation Needs  . Lack of  Transportation (Medical): No  . Lack of Transportation (Non-Medical): No  Physical Activity:   . Days of Exercise per Week:   . Minutes of Exercise per Session:   Stress:   . Feeling of Stress :   Social Connections:   . Frequency of Communication with Friends and Family:   . Frequency of Social Gatherings with Friends and Family:   . Attends Religious Services:   . Active Member of Clubs or Organizations:   . Attends Archivist Meetings:   Marland Kitchen Marital Status:     Review of Systems  Review of Systems: A 12 point ROS discussed and pertinent positives are indicated in the HPI above.  All other systems are negative.  Physical Exam No direct physical exam was performed, telephone health visit only today because of  Covid pandemic Vital Signs: There were no vitals taken for this visit.  Imaging: CT ABDOMEN PELVIS W CONTRAST  Result Date: 03/31/2020 CLINICAL DATA:  Epigastric pain with nausea and diarrhea. EXAM: CT ABDOMEN AND PELVIS WITH CONTRAST TECHNIQUE: Multidetector CT imaging of the abdomen and pelvis was performed using the standard protocol following bolus administration of intravenous contrast. CONTRAST:  160mL OMNIPAQUE IOHEXOL 300 MG/ML  SOLN COMPARISON:  None. FINDINGS: Lower chest: No acute abnormality. Hepatobiliary: No focal liver abnormality is seen. Status post cholecystectomy. No biliary dilatation. Pancreas: Unremarkable. No pancreatic ductal dilatation or surrounding inflammatory changes. Spleen: Normal in size without focal abnormality. Adrenals/Urinary Tract: Adrenal glands are unremarkable. Kidneys are normal, without renal calculi, focal lesion, or hydronephrosis. The urinary bladder is empty and otherwise normal in appearance. Stomach/Bowel: Stomach is within normal limits. Appendix appears normal. No evidence of bowel wall thickening, distention, or inflammatory changes. Vascular/Lymphatic: No significant vascular findings are present. No enlarged abdominal or  pelvic lymph nodes. Reproductive: The uterus is mildly enlarged and slightly heterogeneous in appearance. A 3.0 cm x 1.8 cm cyst is seen along the anterior aspect of the left adnexa. Other: No abdominal wall hernia or abnormality. No abdominopelvic ascites. Musculoskeletal: No acute or significant osseous findings. IMPRESSION: 1. Evidence of prior cholecystectomy. 2. 3.0 cm x 1.8 cm left adnexal cyst, likely ovarian in origin. Electronically Signed   By: Virgina Norfolk M.D.   On: 03/31/2020 08:33   US PELVIC COMPLETE WITH TRANSVAGINAL  Result Date: 03/22/2020 CLINICAL DATA:  Initial evaluation for abnormal uterine bleeding. History of prior uterine ablation, tubal ligation with reversal. EXAM: TRANSABDOMINAL AND TRANSVAGINAL ULTRASOUND OF PELVIS TECHNIQUE: Both transabdominal and transvaginal ultrasound examinations of the pelvis were performed. Transabdominal technique was performed for global imaging of the pelvis including uterus, ovaries, adnexal regions, and pelvic cul-de-sac. It was necessary to proceed with endovaginal exam following the transabdominal exam to visualize the uterus, endometrium, and ovaries. COMPARISON:  Prior ultrasound from 10/02/2018. FINDINGS: Uterus Measurements: 11.8 x 6.4 x 7.1 cm = volume: 270.6 mL. 4.7 x 3.7 x 4.1 cm submucosal fibroid present within the mid uterine body. Endometrium Thickness: 11 mm. Endometrial complex is mildly deformed by the adjacent uterine fibroid. No other focal lesion. Right ovary Measurements: 3.6 x 1.7 x 2.7 cm = volume: 8.8 mL. Few small normal appearing follicles noted. No adnexal mass. Left ovary Measurements: 2.7 x 1.3 x 2.0 cm = volume: 3.4 mL. Normal appearance/no adnexal mass. Other findings No abnormal free fluid. IMPRESSION: 1. 4.7 cm submucosal fibroid at the mid uterine body. 2. Endometrial stripe measures 11 mm in thickness. If bleeding remains unresponsive to hormonal or medical therapy, sonohysterogram should be considered for focal  lesion work-up. (Ref: Radiological Reasoning: Algorithmic Workup of Abnormal Vaginal Bleeding with Endovaginal Sonography and Sonohysterography. AJR 2008; 628:Z66-29). 3. Normal sonographic evaluation of the ovaries. No adnexal mass or free fluid. Electronically Signed   By: Jeannine Boga M.D.   On: 03/22/2020 18:45    Labs:  CBC: Recent Labs    03/31/20 0728  WBC 4.7  HGB 12.6  HCT 38.0  PLT 343    COAGS: No results for input(s): INR, APTT in the last 8760 hours.  BMP: Recent Labs    09/01/19 1553 12/22/19 1350 03/31/20 0728  NA 135 139 141  K 4.1 4.3 3.9  CL 101 102 101  CO2 23 24 30   GLUCOSE 348* 351* 151*  BUN 10 9 10   CALCIUM 9.6 9.8 9.2  CREATININE 0.82 0.98  0.75  GFRNONAA 83 67 >60  GFRAA 96 77 >60    LIVER FUNCTION TESTS: Recent Labs    12/22/19 1350 03/31/20 0728  BILITOT 0.4 0.4  AST 17 14*  ALT 15 12  ALKPHOS 63 51  PROT 7.1 6.9  ALBUMIN 4.4 3.8     Assessment and Plan:  Abnormal uterine bleeding secondary to uterine fibroids and possibly adenomyosis.  Treatment options were reviewed reviewed.  Our discussion centered around uterine fibroid embolization.  The procedure, risk, benefits and alternatives were reviewed.  The procedure was described in detail including the need for conscious sedation and overnight recovery at [facility].  She has a clear understanding of the procedure.  All questions were addressed.  She would like to proceed with the work-up which would include pelvic MRI to assess her fibroid anatomy for embolization.  She also would need to stop any hormone replacement therapies prior to treatment.  Plan: Scheduled for pelvic MRI without and with contrast to assess fibroid anatomy for embolization.  Thank you for this interesting consult.  I greatly enjoyed meeting Tracy Weiss and look forward to participating in their care.  A copy of this report was sent to the requesting provider on this date.  Electronically  Signed: Greggory Keen 04/07/2020, 2:05 PM   I spent a total of  60 Minutes   in remote  clinical consultation, greater than 50% of which was counseling/coordinating care for this patient with symptomatic uterine fibroids.    Visit type: Audio only (telephone). Audio (no video) only due to patient's lack of internet/smartphone capability. Alternative for in-person consultation at [facility], [address] [place], [place], [place]. This visit type was conducted due to national recommendations for restrictions regarding the COVID-19 Pandemic (e.g. social distancing).  This format is felt to be most appropriate for this patient at this time.  All issues noted in this document were discussed and addressed.

## 2020-04-08 ENCOUNTER — Other Ambulatory Visit: Payer: Self-pay | Admitting: Interventional Radiology

## 2020-04-08 DIAGNOSIS — D259 Leiomyoma of uterus, unspecified: Secondary | ICD-10-CM

## 2020-04-15 ENCOUNTER — Other Ambulatory Visit: Payer: No Typology Code available for payment source

## 2020-04-18 ENCOUNTER — Ambulatory Visit (HOSPITAL_COMMUNITY): Payer: BLUE CROSS/BLUE SHIELD

## 2020-04-18 ENCOUNTER — Encounter (HOSPITAL_COMMUNITY): Payer: Self-pay

## 2020-04-18 ENCOUNTER — Ambulatory Visit: Payer: No Typology Code available for payment source | Admitting: Family Medicine

## 2020-04-20 ENCOUNTER — Telehealth: Payer: Self-pay | Admitting: *Deleted

## 2020-04-20 NOTE — Telephone Encounter (Signed)
Tracy Weiss cancelled her Mri Pelvis for UFE. I called Tracy Weiss to r/s and she states " I don't have the money to pay for it and I don't have a job. I am holding off for now."  I told her to call when ready.Cathren Harsh

## 2020-05-06 DIAGNOSIS — Z20822 Contact with and (suspected) exposure to covid-19: Secondary | ICD-10-CM | POA: Diagnosis not present

## 2020-05-06 DIAGNOSIS — R0981 Nasal congestion: Secondary | ICD-10-CM | POA: Diagnosis not present

## 2020-05-10 DIAGNOSIS — Z03818 Encounter for observation for suspected exposure to other biological agents ruled out: Secondary | ICD-10-CM | POA: Diagnosis not present

## 2020-05-12 ENCOUNTER — Ambulatory Visit: Payer: No Typology Code available for payment source | Admitting: Obstetrics and Gynecology

## 2020-05-19 ENCOUNTER — Encounter: Payer: Self-pay | Admitting: Emergency Medicine

## 2020-06-03 DIAGNOSIS — S060XAA Concussion with loss of consciousness status unknown, initial encounter: Secondary | ICD-10-CM | POA: Insufficient documentation

## 2020-06-03 HISTORY — DX: Concussion with loss of consciousness status unknown, initial encounter: S06.0XAA

## 2020-06-06 ENCOUNTER — Telehealth: Payer: Self-pay | Admitting: Pharmacist

## 2020-06-06 NOTE — Telephone Encounter (Signed)
Noted and agree. 

## 2020-06-06 NOTE — Telephone Encounter (Signed)
Patient called and requested samples of Ozempic (semaglutide).   Patient reports continued weight loss and excellent glycemic control.  She denies any side effects from Ozempic.    Medication Samples have been provided for the patient to pick-up.  Drug name: Ozempic (semaglutide)             Qty: 3 pens  LOT: [id number]  Exp.Date: 07/03/2022  Dosing instructions: 0.5mg  weekly  The patient has been instructed regarding the correct time, dose, and frequency of taking this medication, including desired effects and most common side effects.   Janeann Forehand 9:33 AM 06/06/2020   Patient plans to pick-up later today in the afternoon.  Supply is in refrigerator.

## 2020-06-16 ENCOUNTER — Encounter (HOSPITAL_COMMUNITY): Payer: Self-pay | Admitting: Psychiatry

## 2020-06-16 ENCOUNTER — Other Ambulatory Visit: Payer: Self-pay

## 2020-06-16 ENCOUNTER — Telehealth (INDEPENDENT_AMBULATORY_CARE_PROVIDER_SITE_OTHER): Payer: BLUE CROSS/BLUE SHIELD | Admitting: Psychiatry

## 2020-06-16 DIAGNOSIS — F900 Attention-deficit hyperactivity disorder, predominantly inattentive type: Secondary | ICD-10-CM | POA: Diagnosis not present

## 2020-06-16 MED ORDER — LISDEXAMFETAMINE DIMESYLATE 70 MG PO CAPS: 70.0000 mg | ORAL_CAPSULE | Freq: Every morning | ORAL | 0 refills | Status: DC

## 2020-06-16 NOTE — Progress Notes (Signed)
BH MD/PA/NP OP Progress Note  Virtual Visit via Telephone Note  I connected with Tracy Weiss on 06/16/20 at  8:20 AM EDT by telephone and verified that I am speaking with the correct person using two identifiers.  Location: Patient: home Provider: Clinic   I discussed the limitations, risks, security and privacy concerns of performing an evaluation and management service by telephone and the availability of in person appointments. I also discussed with the patient that there may be a patient responsible charge related to this service. The patient expressed understanding and agreed to proceed.   I provided 13 minutes of non-face-to-face time during this encounter.     06/16/2020 8:31 AM Tracy Weiss  MRN:  [id number]  Chief Complaint: " Everything is going great."  HPI: Patient reported that everything is going well.  She stated that she can tell a big difference when she does not take her Vyvanse.  She feels stable on her current regimen and is happy to take it regularly.  She informed that she usually does not take it on the weekends but lately has been using it on the weekends to because of an upcoming state exam.  Visit Diagnosis:    ICD-10-CM   1. Attention deficit hyperactivity disorder (ADHD), predominantly inattentive type  F90.0     Past Psychiatric History: ADHD  Past Medical History:  Past Medical History:  Diagnosis Date  . Abnormal uterine bleeding (AUB)   . Abnormal uterine bleeding (AUB)   . ADHD   . Allergy   . Anxiety   . Diabetes mellitus without complication ([place])    type 2  . Gallstones 09/2016  . GERD (gastroesophageal reflux disease)    after gallbladder removed  . Hyperlipidemia    per pt "taking as preventative"    Past Surgical History:  Procedure Laterality Date  . CESAREAN SECTION    . CHOLECYSTECTOMY N/A 09/11/2016   Procedure: LAPAROSCOPIC CHOLECYSTECTOMY WITH INTRAOPERATIVE CHOLANGIOGRAM;  Surgeon: Donnie Mesa, MD;  Location: [facility];   Service: General;  Laterality: N/A;  . DILATATION AND CURETTAGE/HYSTEROSCOPY WITH MINERVA N/A 02/04/2019   Procedure: DILATATION AND CURETTAGE/ABLATION WITH MINERVA;  Surgeon: Emily Filbert, MD;  Location: [facility];  Service: Gynecology;  Laterality: N/A;  MINERVA ABLATION  . IR RADIOLOGIST EVAL & MGMT  04/07/2020  . tubal ligaton reversal  2017    Family Psychiatric History: Family hx of ADHD in brothers, daughters and grandson  Family History:  Family History  Problem Relation Age of Onset  . Cancer Mother        Metastatic with Unknown Primary; Stomach was involved  . Diabetes Father   . Heart disease Father   . Breast cancer Neg Hx   . Colon cancer Neg Hx   . Esophageal cancer Neg Hx   . Rectal cancer Neg Hx   . Stomach cancer Neg Hx     Social History:  Social History   Socioeconomic History  . Marital status: Single    Spouse name: Not on file  . Number of children: Not on file  . Years of education: Not on file  . Highest education level: Master's degree (e.g., MA, MS, MEng, MEd, MSW, MBA)  Occupational History  . Not on file  Tobacco Use  . Smoking status: Never Smoker  . Smokeless tobacco: Never Used  Vaping Use  . Vaping Use: Never used  Substance and Sexual Activity  . Alcohol use: Yes    Alcohol/week: 0.0 standard drinks  Comment: rarely  . Drug use: No  . Sexual activity: Not Currently    Birth control/protection: None  Other Topics Concern  . Not on file  Social History Narrative  . Not on file   Social Determinants of Health   Financial Resource Strain:   . Difficulty of Paying Living Expenses: Not on file  Food Insecurity: Food Insecurity Present  . Worried About Charity fundraiser in the Last Year: Sometimes true  . Ran Out of Food in the Last Year: Sometimes true  Transportation Needs: No Transportation Needs  . Lack of Transportation (Medical): No  . Lack of Transportation (Non-Medical): No  Physical Activity:   . Days of  Exercise per Week: Not on file  . Minutes of Exercise per Session: Not on file  Stress:   . Feeling of Stress : Not on file  Social Connections:   . Frequency of Communication with Friends and Family: Not on file  . Frequency of Social Gatherings with Friends and Family: Not on file  . Attends Religious Services: Not on file  . Active Member of Clubs or Organizations: Not on file  . Attends Archivist Meetings: Not on file  . Marital Status: Not on file    Allergies: No Known Allergies  Metabolic Disorder Labs: Lab Results  Component Value Date   HGBA1C 7.9 (A) 12/22/2019   Lab Results  Component Value Date   PROLACTIN 11.5 10/07/2018   PROLACTIN 10.3 06/11/2012   Lab Results  Component Value Date   CHOL 193 12/22/2019   TRIG 98 12/22/2019   HDL 56 12/22/2019   CHOLHDL 3.4 12/22/2019   VLDL 26 08/13/2016   LDLCALC 119 (H) 12/22/2019   LDLCALC 98 09/23/2017   Lab Results  Component Value Date   TSH 0.694 10/07/2018   TSH 1.080 09/23/2017    Therapeutic Level Labs: No results found for: LITHIUM No results found for: VALPROATE No components found for:  CBMZ  Current Medications: Current Outpatient Medications  Medication Sig Dispense Refill  . ALPRAZolam (XANAX) 0.25 MG tablet Take 1 tablet (0.25 mg total) by mouth at bedtime as needed for anxiety. 30 tablet 0  . aspirin 81 MG chewable tablet Chew by mouth daily.    . cycloSPORINE (RESTASIS) 0.05 % ophthalmic emulsion Place 1 drop into both eyes 2 (two) times daily as needed (dry eyes).    . famotidine (PEPCID) 20 MG tablet Take 1 tablet (20 mg total) by mouth 2 (two) times daily. 60 tablet 0  . insulin aspart protamine - aspart (NOVOLOG MIX 70/30 FLEXPEN) (70-30) 100 UNIT/ML FlexPen Inject 0.5 mLs (50 Units total) into the skin daily with breakfast AND 0.4 mLs (40 Units total) daily with supper. 15 mL 11  . lisdexamfetamine (VYVANSE) 70 MG capsule Take 1 capsule (70 mg total) by mouth in the morning. 30  capsule 0  . lisdexamfetamine (VYVANSE) 70 MG capsule Take 1 capsule (70 mg total) by mouth in the morning. 30 capsule 0  . lisdexamfetamine (VYVANSE) 70 MG capsule Take 1 capsule (70 mg total) by mouth in the morning. 30 capsule 0  . medroxyPROGESTERone (PROVERA) 10 MG tablet Take 2 tablets (20 mg total) by mouth daily. May return to one pill daily when bleeding stops. 180 tablet 3  . olmesartan-hydrochlorothiazide (BENICAR HCT) 40-25 MG tablet Take 1 tablet by mouth daily. 90 tablet 1  . Prenatal Vit-Fe Fumarate-FA (PRENATAL VITAMIN PO) Take 1 tablet by mouth.    . rosuvastatin (CRESTOR) 20  MG tablet Take 1 tablet (20 mg total) by mouth daily. 90 tablet 1  . Semaglutide,0.25 or 0.5MG /DOS, (OZEMPIC, 0.25 OR 0.5 MG/DOSE,) 2 MG/1.5ML SOPN Inject 0.25 mg into the skin once a week. (Patient taking differently: Inject 0.5 mg into the skin once a week. ) 1 pen 0   No current facility-administered medications for this visit.      Psychiatric Specialty Exam: Review of Systems  There were no vitals taken for this visit.There is no height or weight on file to calculate BMI.  General Appearance: unable to assess due to phone visit  Eye Contact: unable to assess due to phone visit  Speech:  Clear and Coherent and Normal Rate  Volume:  Normal  Mood:  Euthymic  Affect:  Congruent  Thought Process:  Goal Directed, Linear and Descriptions of Associations: Intact  Orientation:  Full (Time, Place, and Person)  Thought Content: Logical   Suicidal Thoughts:  No  Homicidal Thoughts:  No  Memory:  Recent;   Good Remote;   Good  Judgement:  Good  Insight:  Good  Psychomotor Activity:  Normal  Concentration:  Concentration: Good and Attention Span: Good  Recall:  Good  Fund of Knowledge: Good  Language: Good  Akathisia:  Negative  Handed:  Right  AIMS (if indicated): not done  Assets:  Communication Skills Desire for Improvement Financial Resources/Insurance Housing  ADL's:  Intact  Cognition:  WNL  Sleep:  Good   Screenings: GAD-7     Office Visit from 12/03/2019 in [place] for [facility]  Total GAD-7 Score 7    PHQ2-9     Office Visit from 03/01/2020 in [place] Office Visit from 12/22/2019 in [place] Office Visit from 12/03/2019 in [place] for [facility] Office Visit from 11/06/2019 in [place] Office Visit from 08/17/2019 in [place]  PHQ-2 Total Score 2 0 3 2 0  PHQ-9 Total Score -- -- 10 -- --       Assessment and Plan: Patient is stable on her current regimen.  1. Attention deficit hyperactivity disorder (ADHD), predominantly inattentive type  -  lisdexamfetamine (VYVANSE) 70 MG capsule; Take 1 capsule (70 mg total) by mouth in the morning.  Dispense: 30 capsule; Refill: 0 - lisdexamfetamine (VYVANSE) 70 MG capsule; Take 1 capsule (70 mg total) by mouth in the morning.  Dispense: 30 capsule; Refill: 0 - lisdexamfetamine (VYVANSE) 70 MG capsule; Take 1 capsule (70 mg total) by mouth in the morning.  Dispense: 30 capsule; Refill: 0  Continue same regimen. F/up in 3 months.  Nevada Crane, MD 06/16/2020, 8:31 AM

## 2020-06-27 ENCOUNTER — Encounter (HOSPITAL_COMMUNITY): Payer: Self-pay | Admitting: *Deleted

## 2020-06-27 ENCOUNTER — Emergency Department (HOSPITAL_COMMUNITY): Payer: BLUE CROSS/BLUE SHIELD

## 2020-06-27 ENCOUNTER — Other Ambulatory Visit: Payer: Self-pay

## 2020-06-27 ENCOUNTER — Emergency Department (HOSPITAL_COMMUNITY)
Admission: EM | Admit: 2020-06-27 | Discharge: 2020-06-27 | Disposition: A | Discharge status: Home / Self Care | Payer: BLUE CROSS/BLUE SHIELD | Attending: Emergency Medicine | Admitting: Emergency Medicine

## 2020-06-27 DIAGNOSIS — Z79899 Other long term (current) drug therapy: Secondary | ICD-10-CM | POA: Diagnosis not present

## 2020-06-27 DIAGNOSIS — Z7982 Long term (current) use of aspirin: Secondary | ICD-10-CM | POA: Diagnosis not present

## 2020-06-27 DIAGNOSIS — E785 Hyperlipidemia, unspecified: Secondary | ICD-10-CM | POA: Diagnosis not present

## 2020-06-27 DIAGNOSIS — S0990XA Unspecified injury of head, initial encounter: Secondary | ICD-10-CM

## 2020-06-27 DIAGNOSIS — E1169 Type 2 diabetes mellitus with other specified complication: Secondary | ICD-10-CM | POA: Insufficient documentation

## 2020-06-27 DIAGNOSIS — W228XXA Striking against or struck by other objects, initial encounter: Secondary | ICD-10-CM | POA: Diagnosis not present

## 2020-06-27 DIAGNOSIS — Z7984 Long term (current) use of oral hypoglycemic drugs: Secondary | ICD-10-CM | POA: Diagnosis not present

## 2020-06-27 DIAGNOSIS — Z794 Long term (current) use of insulin: Secondary | ICD-10-CM | POA: Diagnosis not present

## 2020-06-27 DIAGNOSIS — S0181XA Laceration without foreign body of other part of head, initial encounter: Secondary | ICD-10-CM | POA: Diagnosis not present

## 2020-06-27 DIAGNOSIS — S0003XA Contusion of scalp, initial encounter: Secondary | ICD-10-CM | POA: Diagnosis not present

## 2020-06-27 MED ORDER — IBUPROFEN 600 MG PO TABS
600.0000 mg | ORAL_TABLET | Freq: Four times a day (QID) | ORAL | 0 refills | Status: DC | PRN
Start: 2020-06-27 — End: 2020-07-13

## 2020-06-27 MED ORDER — LIDOCAINE-EPINEPHRINE (PF) 2 %-1:200000 IJ SOLN
10.0000 mL | Freq: Once | INTRAMUSCULAR | Status: AC
  Administered 2020-06-27: 10 mL
  Filled 2020-06-27: qty 10

## 2020-06-27 MED ORDER — ONDANSETRON 4 MG PO TBDP
4.0000 mg | ORAL_TABLET | Freq: Three times a day (TID) | ORAL | 0 refills | Status: DC | PRN
Start: 2020-06-27 — End: 2021-10-21

## 2020-06-27 MED ORDER — BACITRACIN ZINC 500 UNIT/GM EX OINT
TOPICAL_OINTMENT | Freq: Once | CUTANEOUS | Status: AC
  Filled 2020-06-27: qty 0.9

## 2020-06-27 MED ORDER — ONDANSETRON 4 MG PO TBDP
4.0000 mg | ORAL_TABLET | Freq: Once | ORAL | Status: AC
  Administered 2020-06-27: 4 mg via ORAL
  Filled 2020-06-27: qty 1

## 2020-06-27 MED ORDER — IBUPROFEN 200 MG PO TABS
600.0000 mg | ORAL_TABLET | Freq: Once | ORAL | Status: AC
  Administered 2020-06-27: 600 mg via ORAL
  Filled 2020-06-27: qty 3

## 2020-06-27 MED ORDER — ACETAMINOPHEN 325 MG PO TABS
650.0000 mg | ORAL_TABLET | Freq: Once | ORAL | Status: AC
  Administered 2020-06-27: 650 mg via ORAL
  Filled 2020-06-27: qty 2

## 2020-06-27 NOTE — ED Notes (Signed)
Patient transported to CT 

## 2020-06-27 NOTE — ED Provider Notes (Signed)
[facility] DEPT Provider Note   CSN: [id number] Arrival date & time: 06/27/20  1911     History Chief Complaint  Patient presents with  . Laceration    head    Tracy Weiss is a 54 y.o. female.  HPI      Tracy Weiss is a 54 y.o. female, with a history of anxiety, DM type II, hyperlipidemia, presenting to the ED with injury to the face and head that occurred around 6 PM this evening.  She states she was struck in the face by a piece of swing set that was being disassembled.  Nausea and intermittent dizziness.  She also notes some decreased sensation to the area lateral to the right cheek.  Denies LOC, vomiting, neck/back pain, weakness, difficulty breathing, vision loss, or any other complaints.    Past Medical History:  Diagnosis Date  . Abnormal uterine bleeding (AUB)   . Abnormal uterine bleeding (AUB)   . ADHD   . Allergy   . Anxiety   . Diabetes mellitus without complication ([place])    type 2  . Gallstones 09/2016  . GERD (gastroesophageal reflux disease)    after gallbladder removed  . Hyperlipidemia    per pt "taking as preventative"    Patient Active Problem List   Diagnosis Date Noted  . Hip pain, acute 03/01/2020  . Incontinence 10/08/2019  . Healthcare maintenance 09/17/2019  . Toe injury 08/17/2019  . Pt intentl undrdose of meds regimen due to financl hardship 02/25/2019  . Left atrial enlargement 02/23/2019  . Abnormal uterine bleeding 09/26/2018  . Cholecystitis 09/10/2016  . History of reversal of tubal ligation 04/03/2016  . Generalized anxiety disorder 03/30/2015  . Dysuria 03/31/2013  . PCOS (polycystic ovarian syndrome) 06/25/2012  . Hyperlipidemia 10/12/2011  . IBS (irritable bowel syndrome) 12/29/2010  . Essential hypertension, benign 07/07/2009  . OBESITY 04/13/2009  . Type 2 diabetes mellitus without complication, with long-term current use of insulin ([place]) 12/17/2006  . Anxiety 10/31/2006  . Attention  deficit hyperactivity disorder (ADHD), predominantly inattentive type 10/31/2006    Past Surgical History:  Procedure Laterality Date  . CESAREAN SECTION    . CHOLECYSTECTOMY N/A 09/11/2016   Procedure: LAPAROSCOPIC CHOLECYSTECTOMY WITH INTRAOPERATIVE CHOLANGIOGRAM;  Surgeon: Donnie Mesa, MD;  Location: [facility];  Service: General;  Laterality: N/A;  . DILATATION AND CURETTAGE/HYSTEROSCOPY WITH MINERVA N/A 02/04/2019   Procedure: DILATATION AND CURETTAGE/ABLATION WITH MINERVA;  Surgeon: Emily Filbert, MD;  Location: [facility];  Service: Gynecology;  Laterality: N/A;  MINERVA ABLATION  . IR RADIOLOGIST EVAL & MGMT  04/07/2020  . tubal ligaton reversal  2017     OB History    Gravida  1   Para      Term      Preterm      AB      Living  2     SAB      TAB      Ectopic      Multiple  1   Live Births  2           Family History  Problem Relation Age of Onset  . Cancer Mother        Metastatic with Unknown Primary; Stomach was involved  . Diabetes Father   . Heart disease Father   . Breast cancer Neg Hx   . Colon cancer Neg Hx   . Esophageal cancer Neg Hx   . Rectal cancer Neg Hx   .  Stomach cancer Neg Hx     Social History   Tobacco Use  . Smoking status: Never Smoker  . Smokeless tobacco: Never Used  Vaping Use  . Vaping Use: Never used  Substance Use Topics  . Alcohol use: Yes    Alcohol/week: 0.0 standard drinks    Comment: rarely  . Drug use: No    Home Medications Prior to Admission medications   Medication Sig Start Date End Date Taking? Authorizing Provider  aspirin 81 MG chewable tablet Chew by mouth daily.    [provider]  cycloSPORINE (RESTASIS) 0.05 % ophthalmic emulsion Place 1 drop into both eyes 2 (two) times daily as needed (dry eyes).    [provider]  famotidine (PEPCID) 20 MG tablet Take 1 tablet (20 mg total) by mouth 2 (two) times daily. 03/31/20 04/30/20  Wyvonnia Dusky, MD  ibuprofen (ADVIL)  600 MG tablet Take 1 tablet (600 mg total) by mouth every 6 (six) hours as needed. 06/27/20   Jeremih Dearmas C, PA-C  insulin aspart protamine - aspart (NOVOLOG MIX 70/30 FLEXPEN) (70-30) 100 UNIT/ML FlexPen Inject 0.5 mLs (50 Units total) into the skin daily with breakfast AND 0.4 mLs (40 Units total) daily with supper. 03/09/20   Leavy Cella, RPH-CPP  lisdexamfetamine (VYVANSE) 70 MG capsule Take 1 capsule (70 mg total) by mouth in the morning. 06/16/20   [place] Crane, MD  lisdexamfetamine (VYVANSE) 70 MG capsule Take 1 capsule (70 mg total) by mouth in the morning. 07/16/20   [place] Crane, MD  lisdexamfetamine (VYVANSE) 70 MG capsule Take 1 capsule (70 mg total) by mouth in the morning. 08/15/20   [place] Crane, MD  medroxyPROGESTERone (PROVERA) 10 MG tablet Take 2 tablets (20 mg total) by mouth daily. May return to one pill daily when bleeding stops. 03/15/20   Donnamae Jude, MD  olmesartan-hydrochlorothiazide (BENICAR HCT) 40-25 MG tablet Take 1 tablet by mouth daily. 12/22/19   Kinnie Feil, MD  ondansetron (ZOFRAN ODT) 4 MG disintegrating tablet Take 1 tablet (4 mg total) by mouth every 8 (eight) hours as needed for nausea or vomiting. 06/27/20   Shayde Gervacio C, PA-C  Prenatal Vit-Fe Fumarate-FA (PRENATAL VITAMIN PO) Take 1 tablet by mouth.    [provider]  rosuvastatin (CRESTOR) 20 MG tablet Take 1 tablet (20 mg total) by mouth daily. 12/23/19   Kinnie Feil, MD  Semaglutide,0.25 or 0.5MG /DOS, (OZEMPIC, 0.25 OR 0.5 MG/DOSE,) 2 MG/1.5ML SOPN Inject 0.25 mg into the skin once a week. Patient taking differently: Inject 0.5 mg into the skin once a week.  09/01/19   Caroline More, DO    Allergies    Patient has no known allergies.  Review of Systems   Review of Systems  HENT: Positive for facial swelling. Negative for trouble swallowing.   Eyes: Negative for visual disturbance.  Respiratory: Negative for shortness of breath.   Cardiovascular: Negative for chest pain.    Gastrointestinal: Positive for nausea. Negative for abdominal pain and vomiting.  Skin: Positive for wound.  Neurological: Positive for dizziness and headaches. Negative for seizures, syncope, weakness and numbness.  All other systems reviewed and are negative.   Physical Exam Updated Vital Signs BP 125/86 (BP Location: Right Arm)   Pulse 83   Temp 98.2 F (36.8 C) (Oral)   Resp 18   Ht 5\' 2"  (1.575 m)   Wt 76.2 kg   SpO2 100%   BMI 30.73 kg/m   Physical Exam Vitals and  nursing note reviewed.  Constitutional:      General: She is not in acute distress.    Appearance: She is well-developed. She is not diaphoretic.  HENT:     Head: Normocephalic.     Comments: 3.5 cm laceration above the right eyebrow, as shown.  No noted deformity or instability.    Nose:     Comments: Tenderness and swelling to the bridge of the nose.  No noted deformity.  Nares patent.  No septal hematomas.    Mouth/Throat:     Mouth: Mucous membranes are moist.     Pharynx: Oropharynx is clear.     Comments: Dentition appears to be intact and stable.  No noted area of intraoral swelling.  No trismus or noted abnormal phonation.  Mouth opening to at least 3 finger widths.  Handles oral secretions without difficulty.   Eyes:     Extraocular Movements: Extraocular movements intact.     Conjunctiva/sclera: Conjunctivae normal.     Pupils: Pupils are equal, round, and reactive to light.     Comments: No pain or difficulty with EOMs.  Cardiovascular:     Rate and Rhythm: Normal rate and regular rhythm.     Pulses: Normal pulses.          Radial pulses are 2+ on the right side and 2+ on the left side.  Pulmonary:     Effort: Pulmonary effort is normal. No respiratory distress.  Abdominal:     Palpations: Abdomen is soft.     Tenderness: There is no abdominal tenderness. There is no guarding.  Musculoskeletal:     Cervical back: Normal range of motion and neck supple. No tenderness.     Comments: Normal  motor function intact in all extremities. No midline spinal tenderness.   Skin:    General: Skin is warm and dry.  Neurological:     Mental Status: She is alert and oriented to person, place, and time.     Comments: No noted acute cognitive deficit. Sensation grossly intact to light touch in the extremities.   Grip strengths equal bilaterally.   Strength 5/5 in all extremities.  No gait disturbance.  Coordination intact.  Endorses decreased sensation lateral to the right cheek.  No noted motor function deficit in this region.  Cranial nerves III-XII otherwise grossly intact.  Handles oral secretions without noted difficulty.  No noted phonation or speech deficit. No facial droop.   Psychiatric:        Mood and Affect: Mood and affect normal.        Speech: Speech normal.        Behavior: Behavior normal.           After subcutaneous repair:    ED Results / Procedures / Treatments   Labs (all labs ordered are listed, but only abnormal results are displayed) Labs Reviewed - No data to display  EKG None  Radiology CT Head Wo Contrast  Result Date: 06/27/2020 CLINICAL DATA:  Hit in face with wood beam EXAM: CT HEAD WITHOUT CONTRAST CT MAXILLOFACIAL WITHOUT CONTRAST TECHNIQUE: Multidetector CT imaging of the head and maxillofacial structures were performed using the standard protocol without intravenous contrast. Multiplanar CT image reconstructions of the maxillofacial structures were also generated. COMPARISON:  CT head 11/02/2009 FINDINGS: CT HEAD FINDINGS Brain: No evidence of acute infarction, hemorrhage, hydrocephalus, extra-axial collection or mass lesion/mass effect. Vascular: Negative for hyperdense vessel Skull: Negative for skull fracture.  Right frontal scalp hematoma. Other: None CT MAXILLOFACIAL  FINDINGS Osseous: Negative for fracture Orbits: No orbital fracture or mass.  Normal globe. Sinuses: Mild mucosal edema maxillary sinus bilaterally. No air-fluid levels.  Soft tissues: Right supraorbital small scalp hematoma. IMPRESSION: 1. Negative CT head 2. Negative for facial fracture. Electronically Signed   By: Franchot Gallo M.D.   On: 06/27/2020 21:32   CT Maxillofacial Wo Contrast  Result Date: 06/27/2020 CLINICAL DATA:  Hit in face with wood beam EXAM: CT HEAD WITHOUT CONTRAST CT MAXILLOFACIAL WITHOUT CONTRAST TECHNIQUE: Multidetector CT imaging of the head and maxillofacial structures were performed using the standard protocol without intravenous contrast. Multiplanar CT image reconstructions of the maxillofacial structures were also generated. COMPARISON:  CT head 11/02/2009 FINDINGS: CT HEAD FINDINGS Brain: No evidence of acute infarction, hemorrhage, hydrocephalus, extra-axial collection or mass lesion/mass effect. Vascular: Negative for hyperdense vessel Skull: Negative for skull fracture.  Right frontal scalp hematoma. Other: None CT MAXILLOFACIAL FINDINGS Osseous: Negative for fracture Orbits: No orbital fracture or mass.  Normal globe. Sinuses: Mild mucosal edema maxillary sinus bilaterally. No air-fluid levels. Soft tissues: Right supraorbital small scalp hematoma. IMPRESSION: 1. Negative CT head 2. Negative for facial fracture. Electronically Signed   By: Franchot Gallo M.D.   On: 06/27/2020 21:32    Procedures .Marland KitchenLaceration Repair  Date/Time: 06/27/2020 8:30 PM Performed by: Lorayne Bender, PA-C Authorized by: Lorayne Bender, PA-C   Consent:    Consent obtained:  Verbal   Consent given by:  Patient   Risks discussed:  Infection, need for additional repair, nerve damage, pain, poor cosmetic result and poor wound healing Anesthesia (see MAR for exact dosages):    Anesthesia method:  Local infiltration   Local anesthetic:  Lidocaine 2% WITH epi Laceration details:    Location:  Face   Face location:  Forehead   Length (cm):  3.5 Repair type:    Repair type:  Intermediate Pre-procedure details:    Preparation:  Patient was prepped and draped  in usual sterile fashion and imaging obtained to evaluate for foreign bodies Exploration:    Wound exploration: wound explored through full range of motion and entire depth of wound probed and visualized     Contaminated: no   Treatment:    Area cleansed with:  Saline   Amount of cleaning:  Standard   Irrigation solution:  Sterile saline   Irrigation method:  Syringe Subcutaneous repair:    Suture size:  5-0   Suture material:  Vicryl ([place])   Suture technique:  Figure eight   Number of sutures:  7 Skin repair:    Repair method:  Tissue adhesive Approximation:    Approximation:  Close Post-procedure details:    Dressing:  Open (no dressing)   Patient tolerance of procedure:  Tolerated well, no immediate complications   (including critical care time)  Medications Ordered in ED Medications  ondansetron (ZOFRAN-ODT) disintegrating tablet 4 mg (4 mg Oral Given 06/27/20 2004)  ibuprofen (ADVIL) tablet 600 mg (600 mg Oral Given 06/27/20 2005)  lidocaine-EPINEPHrine (XYLOCAINE W/EPI) 2 %-1:200000 (PF) injection 10 mL (10 mLs Infiltration Given by Other 06/27/20 2005)  acetaminophen (TYLENOL) tablet 650 mg (650 mg Oral Given 06/27/20 2206)  bacitracin ointment ( Topical Given 06/27/20 2206)    ED Course  I have reviewed the triage vital signs and the nursing notes.  Pertinent labs & imaging results that were available during my care of the patient were reviewed by me and considered in my medical decision making (see chart for details).  MDM Rules/Calculators/A&P                          Patient presents for evaluation following head and facial injury. She has some decreased sensation to the right side of the face, but no noted motor deficit.  No functional or focal deficit. Possibility for nerve injury discussed with patient.  Follow-up recommendations also discussed.  The patient was given instructions for home care as well as return precautions. Patient voices  understanding of these instructions, accepts the plan, and is comfortable with discharge.  I reviewed and interpreted the patient's radiological studies.   Findings and plan of care discussed with Ronnald Ramp, MD.   Final Clinical Impression(s) / ED Diagnoses Final diagnoses:  Injury of head, initial encounter  Facial laceration, initial encounter    Rx / DC Orders ED Discharge Orders         Ordered    ondansetron (ZOFRAN ODT) 4 MG disintegrating tablet  Every 8 hours PRN        06/27/20 2210    ibuprofen (ADVIL) 600 MG tablet  Every 6 hours PRN        06/27/20 2210           Lorayne Bender, PA-C 06/27/20 2214    Hayden Rasmussen, MD 06/28/20 1057

## 2020-06-27 NOTE — ED Triage Notes (Signed)
Pt reports that a wooden beam hit on her forehead.  Pt thinks that the beam was about 50-60lbs.  Pt is a/o x 4 and ambulatory.  Laceration noted above right eyebrow. Pt reports not changes in vision but does endorse dizziness and said that feels like she can't hold her head up for long periods of time.

## 2020-06-27 NOTE — Discharge Instructions (Addendum)
Wound Care - Dermabond  Your wound has been closed with a medical-grade glue called Dermabond. Please adhere to the following wound care instructions:  You may shower, but avoid submerging the wound. Do not scrub the wound, as this may cause the glue to wear off prematurely.    The glue will wear off on its own, usually within 5-10 days. During this time period DO NOT apply antibiotic ointments or any other ointments or lotions to the area as this can cause the glue to wear off prematurely.  After 10 days, you may apply ointments, such as Neosporin, to the area to help the remaining glue to wear off.   After the wound has healed and the glue is gone, you may apply ointments such as Aquaphor to the wound to reduce scarring.  May use ibuprofen, naproxen, or Tylenol for pain.  Return to the ED should the wound edges come apart or signs of infection arise, such as spreading redness, puffiness/swelling, pus draining from the wound, severe increase in pain, or any other major issues.  For prescription assistance, may try using prescription discount sites or apps, such as goodrx.com   Head Injury You have been seen today for a head injury, which may or may not have resulted in a concussion. It does not appear to be serious at this time, however, it is important to note that your presentation today is not necessarily an indication of the severity of future symptoms.  Expected symptoms: Expected symptoms of concussion and/or head injury can include nausea, headache, mild dizziness (should still be able to get up and walk around without difficulty), difficulty concentrating, increased sleep, difficulty sleeping, increased intensity of emotions. Close observation: The close observation period is usually 6 hours from the injury. This includes staying awake and having a trustworthy adult monitor you to assure your condition does not worsen. You should be in regular contact with this person and ideally, they  should be able to monitor you in person.  Secondary observation: The secondary observation period is usually 24 hours from the injury. You are allowed to sleep during this time. A trustworthy adult should intermittently monitor you to assure your condition does not worsen.   Overall head injury/concussion care: Rest: Be sure to get plenty of rest. You will need more rest and sleep while you recover. Hydration: Be sure to stay well hydrated by having a goal of drinking about 0.5 liters of water an hour. Pain:  Antiinflammatory medications: Take 600 mg of ibuprofen every 6 hours or 440 mg (over the counter dose) to 500 mg (prescription dose) of naproxen every 12 hours or for the next 3 days. After this time, these medications may be used as needed for pain. Take these medications with food to avoid upset stomach. Choose only one of these medications, do not take them together. Tylenol: Should you continue to have additional pain while taking the ibuprofen or naproxen, you may add in tylenol as needed. Your daily total maximum amount of tylenol from all sources should be limited to 4000mg /day for persons without liver problems, or 2000mg /day for those with liver problems. Return to sports and activities: In general, you may return to normal activities once symptoms have subsided, however, you would ideally be cleared by a primary care provider or other qualified medical professional prior to return to these activities.  Follow up: Follow up with the concussion clinic or your primary care provider for further management of this issue. Return: Return to the ED  should you begin to have confusion, abnormal behavior, aggression, violence, or personality changes, repeated vomiting, vision loss, numbness or weakness on one side of the body, difficulty standing due to dizziness, significantly worsening pain, or any other major concerns.

## 2020-07-01 DIAGNOSIS — S0180XA Unspecified open wound of other part of head, initial encounter: Secondary | ICD-10-CM | POA: Insufficient documentation

## 2020-07-04 ENCOUNTER — Telehealth: Payer: Self-pay | Admitting: Family Medicine

## 2020-07-04 NOTE — Telephone Encounter (Signed)
Pt scheduled for tomorrow (07/05/20) at 9 am.

## 2020-07-04 NOTE — Telephone Encounter (Signed)
Patient called stating that she was seen in the ED last Monday and was diagnosed with a concussion. Patient states she was struck in the face by a piece of swing set that was being disassembled. This caused nausea and intermittent dizziness and along with some decreased sensation to the area lateral to the right cheek.  Please advise on scheduling.

## 2020-07-05 ENCOUNTER — Encounter: Payer: Self-pay | Admitting: Family Medicine

## 2020-07-05 ENCOUNTER — Other Ambulatory Visit: Payer: Self-pay

## 2020-07-05 ENCOUNTER — Ambulatory Visit (INDEPENDENT_AMBULATORY_CARE_PROVIDER_SITE_OTHER): Payer: BLUE CROSS/BLUE SHIELD | Admitting: Family Medicine

## 2020-07-05 VITALS — BP 128/84 | HR 89 | Ht 62.0 in | Wt 168.2 lb

## 2020-07-05 DIAGNOSIS — T148XXA Other injury of unspecified body region, initial encounter: Secondary | ICD-10-CM

## 2020-07-05 DIAGNOSIS — F900 Attention-deficit hyperactivity disorder, predominantly inattentive type: Secondary | ICD-10-CM

## 2020-07-05 DIAGNOSIS — S0181XD Laceration without foreign body of other part of head, subsequent encounter: Secondary | ICD-10-CM

## 2020-07-05 DIAGNOSIS — S060X0A Concussion without loss of consciousness, initial encounter: Secondary | ICD-10-CM | POA: Diagnosis not present

## 2020-07-05 NOTE — Patient Instructions (Addendum)
Thank you for coming in today.  I think this will get better in 1-3 weeks.   Ok to use ibuprofen or tylenol or aleve for headache.   Recheck in 2 weeks if still bothersome.  If ok then no need for follow up.   As for the wound I am happy to see you at any time if you are concerned.   Keep the wound covered with ointment such as Vaseline or aquafor or cocca butter.   The nerve will be touchy for months likely.

## 2020-07-05 NOTE — Progress Notes (Signed)
Subjective:    Chief Complaint: Tracy Weiss,  is a 54 y.o. female who presents for a head injury she sustained on 06/27/20 when she was struck in the head by a piece of swing set she was trying to disassemble.  She was seen at the [facility] ED on 06/27/20 w/ c/o nausea, dizziness, a face laceration and decreased sensitivity to the R proximal head and forehead.  Since then, pt reports con't numbness in the top of her R head and forehead, HA.    She notes some numbness on her right forehead superior to the laceration.  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  Injury date : 06/27/20 Visit #: 1   History of Present Illness:    Concussion Self-Reported Symptom Score Symptoms rated on a scale 1-6, in last 24 hours   Headache: 4    Nausea: 0  Dizziness: 0  Vomiting: 0  Balance Difficulty: 0   Trouble Falling Asleep: 0   Fatigue: 0  Sleep Less Than Usual: 0  Daytime Drowsiness: 0  Sleep More Than Usual: 0  Photophobia: 6  Phonophobia: 0  Irritability: 0  Sadness: 0  Numbness or Tingling: 0  Nervousness: 0  Feeling More Emotional: 0  Feeling Mentally Foggy: 6  Feeling Slowed Down: 0  Memory Problems: 5  Difficulty Concentrating: 5  Visual Problems: 5   Total # of Symptoms: 6/22 Total Symptom Score: 31/132 Previous Symptom Score: N/A   Neck Pain: No  Tinnitus: No  Review of Systems: No fevers or chills  Review of History: Prior history ADHD currently on Vyvanse.  Objective:    Physical Examination Vitals:   07/05/20 0858  BP: 128/84  Pulse: 89  SpO2: 98%   MSK: C-spine normal motion Neuro: Alert and oriented normal coordination.  Impaired balance tandem and single leg stance. Psych: Normal speech thought process and affect. Skin: Well-appearing laceration superior to right eyebrow. Normal forehead motion  Picture of facial laceration prior to suturing   Radiology: CT Head Wo Contrast  Result Date:  06/27/2020 CLINICAL DATA:  Hit in face with wood beam EXAM: CT HEAD WITHOUT CONTRAST CT MAXILLOFACIAL WITHOUT CONTRAST TECHNIQUE: Multidetector CT imaging of the head and maxillofacial structures were performed using the standard protocol without intravenous contrast. Multiplanar CT image reconstructions of the maxillofacial structures were also generated. COMPARISON:  CT head 11/02/2009 FINDINGS: CT HEAD FINDINGS Brain: No evidence of acute infarction, hemorrhage, hydrocephalus, extra-axial collection or mass lesion/mass effect. Vascular: Negative for hyperdense vessel Skull: Negative for skull fracture.  Right frontal scalp hematoma. Other: None CT MAXILLOFACIAL FINDINGS Osseous: Negative for fracture Orbits: No orbital fracture or mass.  Normal globe. Sinuses: Mild mucosal edema maxillary sinus bilaterally. No air-fluid levels. Soft tissues: Right supraorbital small scalp hematoma. IMPRESSION: 1. Negative CT head 2. Negative for facial fracture. Electronically Signed   By: Franchot Gallo M.D.   On: 06/27/2020 21:32   IR Radiologist Eval & Mgmt  Result Date: 04/07/2020 Please refer to notes tab for details about interventional procedure. (Op Note)  CT Maxillofacial Wo Contrast  Result Date: 06/27/2020 CLINICAL DATA:  Hit in face with wood beam EXAM: CT HEAD WITHOUT CONTRAST CT MAXILLOFACIAL WITHOUT CONTRAST TECHNIQUE: Multidetector CT imaging of the head and maxillofacial structures were performed using the standard protocol without intravenous contrast. Multiplanar CT image reconstructions of the maxillofacial structures were also generated. COMPARISON:  CT head 11/02/2009 FINDINGS: CT HEAD FINDINGS Brain: No evidence of acute infarction,  hemorrhage, hydrocephalus, extra-axial collection or mass lesion/mass effect. Vascular: Negative for hyperdense vessel Skull: Negative for skull fracture.  Right frontal scalp hematoma. Other: None CT MAXILLOFACIAL FINDINGS Osseous: Negative for fracture Orbits: No  orbital fracture or mass.  Normal globe. Sinuses: Mild mucosal edema maxillary sinus bilaterally. No air-fluid levels. Soft tissues: Right supraorbital small scalp hematoma. IMPRESSION: 1. Negative CT head 2. Negative for facial fracture. Electronically Signed   By: Franchot Gallo M.D.   On: 06/27/2020 21:32   I, [name], personally (independently) visualized and performed the interpretation of the images attached in this note.   Assessment and Plan   54 y.o. female with concussion without loss of consciousness.  Doing reasonably well.  Still having some issues focusing and concentration likely worsening of her ADHD symptoms.  She also has a headache which is not a surprise.  This should improve on its own in the next few weeks.  Okay to use Tylenol or ibuprofen as needed for headache.  The disrupted nerve sensation to her forehead is likely secondary to V1 nerve injury with the laceration.  She is already getting some return of symptoms which is reassuring.  Good control of diabetes will help this improve faster.  Additionally spent some time discussing wound management for laceration.  She has absorbable deep sutures with superficial adhesive.  This will not require any special management aside from typical wound care.  Spent some time discussing this as well.  Recheck in about 2 weeks and was significantly improved.      Action/Discussion: Reviewed diagnosis, management options, expected outcomes, and the reasons for scheduled and emergent follow-up. Questions were adequately answered. Patient expressed verbal understanding and agreement with the following plan.     Patient Education:  Reviewed with patient the risks (i.e, a repeat concussion, post-concussion syndrome, second-impact syndrome) of returning to play prior to complete resolution, and thoroughly reviewed the signs and symptoms of concussion.Reviewed need for complete resolution of all symptoms, with rest AND exertion, prior  to return to play.  Reviewed red flags for urgent medical evaluation: worsening symptoms, nausea/vomiting, intractable headache, musculoskeletal changes, focal neurological deficits.  [facility] Concussion Care Plan, which clearly outlines the plans stated above, was given to patient.   In addition to the time spent performing tests, I spent 30 min   Reviewed with patient the risks (i.e, a repeat concussion, post-concussion syndrome, second-impact syndrome) of returning to play prior to complete resolution, and thoroughly reviewed the signs and symptoms of      concussion. Reviewedf need for complete resolution of all symptoms, with rest AND exertion, prior to return to play.  Reviewed red flags for urgent medical evaluation: worsening symptoms, nausea/vomiting, intractable headache, musculoskeletal changes, focal neurological deficits.  [facility] Concussion Care Plan, which clearly outlines the plans stated above, was given to patient   After Visit Summary printed out and provided to patient as appropriate.  The above documentation has been reviewed and is accurate and complete [name]

## 2020-07-07 DIAGNOSIS — H43393 Other vitreous opacities, bilateral: Secondary | ICD-10-CM | POA: Diagnosis not present

## 2020-07-08 ENCOUNTER — Telehealth: Payer: Self-pay | Admitting: Family Medicine

## 2020-07-08 ENCOUNTER — Encounter: Payer: Self-pay | Admitting: General Practice

## 2020-07-08 ENCOUNTER — Telehealth: Payer: Self-pay | Admitting: General Practice

## 2020-07-08 DIAGNOSIS — N939 Abnormal uterine and vaginal bleeding, unspecified: Secondary | ICD-10-CM

## 2020-07-08 MED ORDER — TRANEXAMIC ACID 650 MG PO TABS: 1300.0000 mg | ORAL_TABLET | Freq: Three times a day (TID) | ORAL | 0 refills | Status: AC

## 2020-07-08 NOTE — Telephone Encounter (Signed)
Patient called into the front office reporting problems with bleeding. Patient reports taking provera 20mg  daily. She states she has been spotting since at least 10/5 but over the past week her bleeding has become heavy. She states she has already bled through her clothes twice today so far. Per chart review, patient had ablation June 2020 & saw Dr Kennon Rounds in April. Discussed with Dr Ilda Basset who states patient can take Lysteda 1300mg  TID x5 days & needs to stop provera during that time. After completing the 5 days, patient can restart provera & needs follow up appt with Dr Kennon Rounds. Discussed with patient. Told patient to call her pharmacy to make sure their will not be problems with her insurance soon because we close early today. Patient verbalized understanding & had no other questions.

## 2020-07-08 NOTE — Telephone Encounter (Signed)
Attempted to reach patient about her appointment with Dr. Kennon Rounds. Left a message for her to call the office.

## 2020-07-13 ENCOUNTER — Other Ambulatory Visit: Payer: Self-pay

## 2020-07-13 ENCOUNTER — Encounter: Payer: Self-pay | Admitting: Family Medicine

## 2020-07-13 ENCOUNTER — Telehealth: Payer: Self-pay | Admitting: Family Medicine

## 2020-07-13 ENCOUNTER — Ambulatory Visit (INDEPENDENT_AMBULATORY_CARE_PROVIDER_SITE_OTHER): Payer: BLUE CROSS/BLUE SHIELD | Admitting: Family Medicine

## 2020-07-13 VITALS — BP 129/83 | HR 103 | Ht 61.0 in | Wt 164.0 lb

## 2020-07-13 DIAGNOSIS — D250 Submucous leiomyoma of uterus: Secondary | ICD-10-CM | POA: Insufficient documentation

## 2020-07-13 DIAGNOSIS — Z23 Encounter for immunization: Secondary | ICD-10-CM | POA: Diagnosis not present

## 2020-07-13 HISTORY — DX: Submucous leiomyoma of uterus: D25.0

## 2020-07-13 MED ORDER — ORIAHNN 300-1-0.5 & 300 MG PO CPPK: 1.0000 | ORAL_CAPSULE | Freq: Two times a day (BID) | ORAL | 1 refills | Status: DC

## 2020-07-13 NOTE — Telephone Encounter (Signed)
Corrected letter emailed to pt per her request.

## 2020-07-13 NOTE — Telephone Encounter (Signed)
Called patient and confirmed.  She is not currently driving.  Work Psychologist, occupational clarified and written.  Will be scanned and emailed to patient.

## 2020-07-13 NOTE — Assessment & Plan Note (Signed)
Likely causing her bleeding. Offered aygestin, IUD, hysterectomy, attempt at endometrial evaluation and resection (though with prior ablation may be difficult). After careful consideration, we will try her on Oriahnn. Rx card gotten for her.

## 2020-07-13 NOTE — Patient Instructions (Signed)

## 2020-07-13 NOTE — Telephone Encounter (Signed)
Pt would like her work note updated to include NO DRIVING.  Please bring up front and we will scan/email to pt email address on file.

## 2020-07-13 NOTE — Progress Notes (Signed)
   Subjective:    Patient ID: Tracy Weiss is a 54 y.o. female presenting with Follow-up  on 07/13/2020  HPI: Here to f/u bleeding. Has had EMB and has had bleeding previously responsive to Progestins. Has been on Megace and Provera and now is still having bleeding. Recently placed on Lysteda which is not helping. Has an u/s which shows 4.7 submucosal fibroid. Underwent Minerva ablation with Dr. Hulan Fray. Has h/o C-section x 1 for twins.  Review of Systems  Constitutional: Negative for chills and fever.  Respiratory: Negative for shortness of breath.   Cardiovascular: Negative for chest pain.  Gastrointestinal: Negative for abdominal pain, nausea and vomiting.  Genitourinary: Negative for dysuria.  Skin: Negative for rash.      Objective:    BP 129/83   Pulse (!) 103   Ht 5\' 1"  (1.549 m)   Wt 164 lb (74.4 kg)   LMP 06/07/2020 (Exact Date)   BMI 30.99 kg/m  Physical Exam Constitutional:      General: She is not in acute distress.    Appearance: She is well-developed.  HENT:     Head: Normocephalic and atraumatic.  Eyes:     General: No scleral icterus. Cardiovascular:     Rate and Rhythm: Normal rate.  Pulmonary:     Effort: Pulmonary effort is normal.  Abdominal:     Palpations: Abdomen is soft.  Musculoskeletal:     Cervical back: Neck supple.  Skin:    General: Skin is warm and dry.  Neurological:     Mental Status: She is alert and oriented to person, place, and time.         Assessment & Plan:   Problem List Items Addressed This Visit      Unprioritized   Fibroids, submucosal    Likely causing her bleeding. Offered aygestin, IUD, hysterectomy, attempt at endometrial evaluation and resection (though with prior ablation may be difficult). After careful consideration, we will try her on Oriahnn. Rx card gotten for her.       Other Visit Diagnoses    Flu vaccine need    -  Primary   Relevant Orders   Flu Vaccine QUAD 36+ mos IM (Completed)       Total time in review of prior notes, pathology, labs, history taking, review with patient, exam, note writing, discussion of options, plan for next steps, alternatives and risks of treatment: 32 minutes.  Return in about 4 weeks (around 08/10/2020) for virtual.  Donnamae Jude 07/13/2020 3:41 PM

## 2020-07-14 ENCOUNTER — Encounter: Payer: Self-pay | Admitting: Student in an Organized Health Care Education/Training Program

## 2020-08-02 DIAGNOSIS — H1820 Unspecified corneal edema: Secondary | ICD-10-CM | POA: Diagnosis not present

## 2020-08-02 NOTE — Progress Notes (Signed)
Subjective:    Chief Complaint: Tracy Weiss, LAT, ATC, am serving as scribe for Dr. [name].  Tracy Weiss,  is a 54 y.o. female who presents for f/u of concussion that occurred on 06/27/20 when she was struck in the head by a swing set that she was trying to disassemble.  She was last seen by Dr. [name] on 07/05/20 and reported con't HA and numbness at the top of her head/forehead associated w/ the laceration she sustained.  She was advised to use OTC meds for her HA and to re-check prn.  Since her last visit, pt reports that she is having issues w/ the vision in her R eye, memory issues; processing speed; difficulty focusing/mental fogginess.  She was seen by her ophthalmologist yesterday for new acute blurry vision.  After dedicated eye exam did not have severe abnormalities visible.  Ophthalmology thinks issue is more neurologic.  Injury date : 06/27/20 Visit #: 2   History of Present Illness:    Concussion Self-Reported Symptom Score Symptoms rated on a scale 1-6, in last 24 hours   Headache: 4    Nausea: 0  Dizziness: 0  Vomiting: 0  Balance Difficulty: 2   Trouble Falling Asleep: 0   Fatigue: 0  Sleep Less Than Usual: 3  Daytime Drowsiness: 0  Sleep More Than Usual: 0  Photophobia: 6  Phonophobia: 5  Irritability: 6  Sadness: 2  Numbness or Tingling: 0  Nervousness: 3  Feeling More Emotional: 3  Feeling Mentally Foggy: 6  Feeling Slowed Down: 6  Memory Problems: 6  Difficulty Concentrating: 6  Visual Problems: 6   Total # of Symptoms: 14/22 Total Symptom Score: 64/132 Previous Total # of Symptoms: 6/22 Previous Symptom Score: 31/132   Neck Pain: No  Tinnitus: No  Review of Systems: No fevers or chills  Review of History: ADHD managed with Vyvanse.  Hypertension.  Diabetes.  Objective:    Physical Examination Vitals:   08/03/20 0933  BP: 130/82  Pulse: 92  SpO2: 98%   MSK: Normal cervical motion.  Maturing scar right eyebrow Neuro: Alert  and oriented normal coordination and balance. Psych: Normal speech thought process and affect.  Imaging:  CT Head Wo Contrast  Result Date: 06/27/2020 CLINICAL DATA:  Hit in face with wood beam EXAM: CT HEAD WITHOUT CONTRAST CT MAXILLOFACIAL WITHOUT CONTRAST TECHNIQUE: Multidetector CT imaging of the head and maxillofacial structures were performed using the standard protocol without intravenous contrast. Multiplanar CT image reconstructions of the maxillofacial structures were also generated. COMPARISON:  CT head 11/02/2009 FINDINGS: CT HEAD FINDINGS Brain: No evidence of acute infarction, hemorrhage, hydrocephalus, extra-axial collection or mass lesion/mass effect. Vascular: Negative for hyperdense vessel Skull: Negative for skull fracture.  Right frontal scalp hematoma. Other: None CT MAXILLOFACIAL FINDINGS Osseous: Negative for fracture Orbits: No orbital fracture or mass.  Normal globe. Sinuses: Mild mucosal edema maxillary sinus bilaterally. No air-fluid levels. Soft tissues: Right supraorbital small scalp hematoma. IMPRESSION: 1. Negative CT head 2. Negative for facial fracture. Electronically Signed   By: Franchot Gallo M.D.   On: 06/27/2020 21:32   CT Maxillofacial Wo Contrast  Result Date: 06/27/2020 CLINICAL DATA:  Hit in face with wood beam EXAM: CT HEAD WITHOUT CONTRAST CT MAXILLOFACIAL WITHOUT CONTRAST TECHNIQUE: Multidetector CT imaging of the head and maxillofacial structures were performed using the standard protocol without intravenous contrast. Multiplanar CT image reconstructions of the maxillofacial structures were also generated. COMPARISON:  CT head 11/02/2009 FINDINGS: CT HEAD FINDINGS Brain:  No evidence of acute infarction, hemorrhage, hydrocephalus, extra-axial collection or mass lesion/mass effect. Vascular: Negative for hyperdense vessel Skull: Negative for skull fracture.  Right frontal scalp hematoma. Other: None CT MAXILLOFACIAL FINDINGS Osseous: Negative for fracture  Orbits: No orbital fracture or mass.  Normal globe. Sinuses: Mild mucosal edema maxillary sinus bilaterally. No air-fluid levels. Soft tissues: Right supraorbital small scalp hematoma. IMPRESSION: 1. Negative CT head 2. Negative for facial fracture. Electronically Signed   By: Franchot Gallo M.D.   On: 06/27/2020 21:32   I, [name], personally (independently) visualized and performed the interpretation of the images attached in this note.   Assessment and Plan   54 y.o. female with concussion with worsening neurologic symptoms now involving blurry vision.  Patient had good eye exam yesterday with no severe abnormalities noted.  Think main issue is her concussion is worsening.  This is unexpected.  No severe neurologic defects noted today in clinic.  However based on her sudden worsening symptoms we will proceed to brain MRI to further rule out more acute issues.  Assuming this is normal proceed with continued concussion management.  For now add nortriptyline at bedtime as this may help with headache.  Also refer to neuro-ophthalmology as it may be quite helpful for her bothersome blurry vision.  Discussed work.  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  Unfortunate she does not have medication or short-term disability and cannot afford to miss work at this time.  Try to continue to work.  Recommend sunglasses or turning the brightness on her monitor down if possible.  Check back in 3 weeks.     Action/Discussion: Reviewed diagnosis, management options, expected outcomes, and the reasons for scheduled and emergent follow-up. Questions were adequately answered. Patient expressed verbal understanding and agreement with the following plan.     Patient Education:  Reviewed with patient the risks (i.e, a repeat concussion, post-concussion syndrome, second-impact syndrome) of returning to play prior to complete resolution, and thoroughly reviewed the signs and symptoms of  concussion.Reviewed need for complete resolution of all symptoms, with rest AND exertion, prior to return to play.  Reviewed red flags for urgent medical evaluation: worsening symptoms, nausea/vomiting, intractable headache, musculoskeletal changes, focal neurological deficits.  [facility] Con[facility]ussion Care Plan, which clearly outlines the plans stated above, was given to patient.    Total encounter time 20 minutes including face-to-face time with the patient and, reviewing past medical record, and charting on the date of service.    Treatment plan and options.   After Visit Summary printed out and provided to patient as appropriate.  The above documentation has been reviewed and is accurate and complete [name]

## 2020-08-03 ENCOUNTER — Other Ambulatory Visit: Payer: Self-pay

## 2020-08-03 ENCOUNTER — Telehealth: Payer: Self-pay

## 2020-08-03 ENCOUNTER — Encounter: Payer: Self-pay | Admitting: Family Medicine

## 2020-08-03 ENCOUNTER — Ambulatory Visit (INDEPENDENT_AMBULATORY_CARE_PROVIDER_SITE_OTHER): Payer: BLUE CROSS/BLUE SHIELD | Admitting: Family Medicine

## 2020-08-03 VITALS — BP 130/82 | HR 92 | Ht 61.0 in | Wt 166.6 lb

## 2020-08-03 DIAGNOSIS — H5711 Ocular pain, right eye: Secondary | ICD-10-CM | POA: Diagnosis not present

## 2020-08-03 DIAGNOSIS — S060X0A Concussion without loss of consciousness, initial encounter: Secondary | ICD-10-CM

## 2020-08-03 DIAGNOSIS — H538 Other visual disturbances: Secondary | ICD-10-CM

## 2020-08-03 MED ORDER — NORTRIPTYLINE HCL 25 MG PO CAPS
25.0000 mg | ORAL_CAPSULE | Freq: Every day | ORAL | 2 refills | Status: DC
Start: 2020-08-03 — End: 2020-10-14

## 2020-08-03 NOTE — Patient Instructions (Addendum)
Thank you for coming in today.  Try the nortryptline at bedtime for headache prevention.   I have referred to Neuro Ophtmalogy for your eye at [facility] center.   Plan for brain MRI.   Recheck with me in about 3 weeks.   Let me know if you need a work note.

## 2020-08-03 NOTE — Telephone Encounter (Addendum)
Patient calls nurse line requesting rx refill on alprazolam. This medication seems to be discontinued 06/16/2020.   Please advise if refill is appropriate.   Talbot Grumbling, RN

## 2020-08-04 NOTE — Telephone Encounter (Signed)
From what I can gather, that medication was prescribed by her psychiatrist and discontinued.  I have never prescribed it for her.  She would have to request from her psychiatrist.

## 2020-08-05 NOTE — Telephone Encounter (Signed)
Called patient and informed of below. Patient states that she has been receiving medication from [facility] and our office needs to "get the records straight". It appears that patient was previously receiving medication refills from Dr. Tammi Klippel. Advised patient that she has a new provider now and that an appointment would be needed to further discuss.   Scheduled patient for Monday afternoon with PCP   Talbot Grumbling, RN

## 2020-08-08 ENCOUNTER — Ambulatory Visit
Payer: No Typology Code available for payment source | Admitting: Student in an Organized Health Care Education/Training Program

## 2020-08-11 ENCOUNTER — Telehealth: Payer: Self-pay

## 2020-08-11 NOTE — Telephone Encounter (Signed)
Patient requested samples of Ozempic (semaglutide)  Dose 0.5mg  once weekly.   Medication Samples have been provided for the patient to pick-up on 12/10.  Drug name: Ozempic (semaglutide)        Qty: 2 pens  LOT: [id number]  Exp.Date: 07/03/2022  Dosing instructions: 0.5mg  once weekly  The patient has been instructed regarding the correct time, dose, and frequency of taking this medication, including desired effects and most common side effects.   Janeann Forehand 2:37 PM 08/11/2020

## 2020-08-11 NOTE — Telephone Encounter (Signed)
Noted and agree. 

## 2020-08-15 ENCOUNTER — Ambulatory Visit
Payer: No Typology Code available for payment source | Admitting: Student in an Organized Health Care Education/Training Program

## 2020-08-17 ENCOUNTER — Other Ambulatory Visit: Payer: Self-pay | Admitting: Student in an Organized Health Care Education/Training Program

## 2020-08-20 ENCOUNTER — Other Ambulatory Visit: Payer: No Typology Code available for payment source

## 2020-08-24 ENCOUNTER — Ambulatory Visit: Payer: No Typology Code available for payment source | Admitting: Family Medicine

## 2020-09-01 ENCOUNTER — Other Ambulatory Visit: Payer: Self-pay | Admitting: Family Medicine

## 2020-09-01 DIAGNOSIS — Z1231 Encounter for screening mammogram for malignant neoplasm of breast: Secondary | ICD-10-CM

## 2020-09-04 ENCOUNTER — Telehealth: Payer: Self-pay | Admitting: Family Medicine

## 2020-09-04 NOTE — Telephone Encounter (Signed)
Opened in error

## 2020-09-09 ENCOUNTER — Telehealth: Payer: Self-pay

## 2020-09-09 ENCOUNTER — Encounter: Payer: Self-pay | Admitting: Family Medicine

## 2020-09-09 ENCOUNTER — Other Ambulatory Visit (HOSPITAL_COMMUNITY)
Admission: RE | Admit: 2020-09-09 | Discharge: 2020-09-09 | Disposition: A | Discharge status: Home / Self Care | Payer: BLUE CROSS/BLUE SHIELD | Source: Ambulatory Visit | Attending: Family Medicine | Admitting: Family Medicine

## 2020-09-09 ENCOUNTER — Other Ambulatory Visit: Payer: Self-pay

## 2020-09-09 ENCOUNTER — Ambulatory Visit (INDEPENDENT_AMBULATORY_CARE_PROVIDER_SITE_OTHER): Payer: BLUE CROSS/BLUE SHIELD | Admitting: Family Medicine

## 2020-09-09 VITALS — BP 138/82 | HR 96 | Ht 61.0 in | Wt 166.0 lb

## 2020-09-09 DIAGNOSIS — E119 Type 2 diabetes mellitus without complications: Secondary | ICD-10-CM | POA: Diagnosis not present

## 2020-09-09 DIAGNOSIS — Z794 Long term (current) use of insulin: Secondary | ICD-10-CM

## 2020-09-09 DIAGNOSIS — N898 Other specified noninflammatory disorders of vagina: Secondary | ICD-10-CM | POA: Diagnosis not present

## 2020-09-09 DIAGNOSIS — F419 Anxiety disorder, unspecified: Secondary | ICD-10-CM | POA: Diagnosis not present

## 2020-09-09 DIAGNOSIS — N760 Acute vaginitis: Secondary | ICD-10-CM

## 2020-09-09 DIAGNOSIS — B9689 Other specified bacterial agents as the cause of diseases classified elsewhere: Secondary | ICD-10-CM

## 2020-09-09 LAB — POCT GLYCOSYLATED HEMOGLOBIN (HGB A1C): Hemoglobin A1C: 6.1 % — AB (ref 4.0–5.6)

## 2020-09-09 LAB — POCT WET PREP (WET MOUNT)
Clue Cells Wet Prep Whiff POC: POSITIVE
Trichomonas Wet Prep HPF POC: ABSENT

## 2020-09-09 MED ORDER — CYCLOSPORINE 0.05 % OP EMUL: 1.0000 [drp] | Freq: Two times a day (BID) | OPHTHALMIC | 6 refills | Status: DC | PRN

## 2020-09-09 MED ORDER — METRONIDAZOLE 0.75 % VA GEL: 1.0000 | Freq: Every day | VAGINAL | 0 refills | Status: DC

## 2020-09-09 MED ORDER — HYDROXYZINE HCL 25 MG PO TABS: 25.0000 mg | ORAL_TABLET | Freq: Three times a day (TID) | ORAL | 3 refills | Status: DC | PRN

## 2020-09-09 NOTE — Assessment & Plan Note (Signed)
Patient with history of anxiety for which she used to occasionally take alprazolam 0.25 mg as needed.  Patient's last prescription was filled in May 2021, only recently ran out of the medication. -After discussion with the patient today chose to try Atarax 25 mg as needed for anxiety/feeling of getting worked up, can take as often as 3 times daily

## 2020-09-09 NOTE — Assessment & Plan Note (Signed)
Patient's vaginal swab today showing clue cells and bacteria, positive for bacterial vaginosis. -Patient prescribed MetroGel 1.22% application nightly x5 days -Was called after hours and instructed on use of the medication, all questions answered

## 2020-09-09 NOTE — Patient Instructions (Addendum)
Thank you for coming in to see [name] today! Please see below to review our plan for today's visit:  1. Look into [employer] Protein powder from [employer] 2. Remember to take your Crestor daily! 3. You can take Atarax 3 times daily as needed for feeling flustered and before your MRI.  Please call the clinic at [phone] if your symptoms worsen or you have any concerns. It was our pleasure to serve you!   Dr. Milus Banister [facility]

## 2020-09-09 NOTE — Assessment & Plan Note (Signed)
Patient's HbA1c today 6.1%!  Doing very well on the Ozempic 0.25 mg weekly.  Patient is also made great strides with improving her diet and lifestyle. -Continue Ozempic, can recheck HbA1c in 3-6 months

## 2020-09-09 NOTE — Progress Notes (Signed)
Medication Samples have been provided to the patient.  Drug name: Semaglutide (Ozempic)      Strength: 2mg /1.17mL        Qty: 2 pens LOT: [id number]  Exp.Date: 10/03/2022  Dosing instructions: Inject 0.25mg  under the skin once weekly.  The patient has been instructed regarding the correct time, dose, and frequency of taking this medication, including desired effects and most common side effects.   Hughes Better 3:17 PM 09/09/2020

## 2020-09-09 NOTE — Telephone Encounter (Signed)
Telephoned patient at home number. Voice mail unavailable, not able to leave call back information from [place].

## 2020-09-09 NOTE — Progress Notes (Signed)
    SUBJECTIVE:   CHIEF COMPLAINT / HPI:   Vaginal discharge: Patient reports that since Wednesday she has had a slight increase in vaginal discharge. Denies any itch, vaginal bleeding or malodor. Not sexually active, not concerned for STI. July 2020 had PAP which showed ASCUS, plan to repeat in 2023.  T2DM: December 2020 A1c was 11.1%, April 2021 was 7.9%. HbA1c today 6.1%! She is currently taking semaglutide/Ozempic 0.25 mg daily. Patient also reports good compliance with healthy diet and that she has been active. Foot exam performed at today's visit normal.  History of Anxiety/Getting worked up: Patient used to be prescribed alprazolam 0.25 mg as needed for when she felt very worked up or was getting very flustered. Patient last received prescription for alprazolam in May 2021, she just recently ran out of the medication. She was wondering if she could have a refill of this medication for feeling occasionally worked up. It was discussed to start her on Atarax instead as it is a safer medication.  Health maintenance: due for a tetanus shot  Colonoscopy April 2021 normal - 10 year follow up   PERTINENT  PMH / PSH:  Recurrent bacterial vaginosis, PCOS, HLD, HTN, T2 DM   OBJECTIVE:   BP 138/82   Pulse 96   Ht 5\' 1"  (1.549 m)   Wt 166 lb (75.3 kg)   SpO2 99%   BMI 31.37 kg/m    Physical exam: General: Well-appearing patient, no apparent distress, very pleasant Respiratory: Comfortable work of breathing, speaking complete sentences GU: Normal-appearing labia minora and majora without lesion, scant vaginal discharge that is clear/white in appearance, cervix normal in appearance without lesion, closed cervical os   ASSESSMENT/PLAN:   Type 2 diabetes mellitus without complication, with long-term current use of insulin (HCC) Patient's HbA1c today 6.1%!  Doing very well on the Ozempic 0.25 mg weekly.  Patient is also made great strides with improving her diet and lifestyle. -Continue  Ozempic, can recheck HbA1c in 3-6 months  Bacterial vaginosis Patient's vaginal swab today showing clue cells and bacteria, positive for bacterial vaginosis. -Patient prescribed MetroGel 9.41% application nightly x5 days -Was called after hours and instructed on use of the medication, all questions answered  Anxiety Patient with history of anxiety for which she used to occasionally take alprazolam 0.25 mg as needed.  Patient's last prescription was filled in May 2021, only recently ran out of the medication. -After discussion with the patient today chose to try Atarax 25 mg as needed for anxiety/feeling of getting worked up, can take as often as 3 times daily     Daisy Floro, [place]

## 2020-09-12 ENCOUNTER — Other Ambulatory Visit: Payer: Self-pay | Admitting: Family Medicine

## 2020-09-12 ENCOUNTER — Other Ambulatory Visit: Payer: Self-pay

## 2020-09-12 ENCOUNTER — Telehealth (INDEPENDENT_AMBULATORY_CARE_PROVIDER_SITE_OTHER): Payer: BLUE CROSS/BLUE SHIELD | Admitting: Psychiatry

## 2020-09-12 ENCOUNTER — Encounter (HOSPITAL_COMMUNITY): Payer: Self-pay | Admitting: Psychiatry

## 2020-09-12 DIAGNOSIS — F900 Attention-deficit hyperactivity disorder, predominantly inattentive type: Secondary | ICD-10-CM

## 2020-09-12 DIAGNOSIS — Z1231 Encounter for screening mammogram for malignant neoplasm of breast: Secondary | ICD-10-CM

## 2020-09-12 LAB — CERVICOVAGINAL ANCILLARY ONLY
Chlamydia: NEGATIVE
Comment: NEGATIVE
Comment: NORMAL
Neisseria Gonorrhea: NEGATIVE

## 2020-09-12 MED ORDER — LISDEXAMFETAMINE DIMESYLATE 70 MG PO CAPS
70.0000 mg | ORAL_CAPSULE | Freq: Every morning | ORAL | 0 refills | Status: DC
Start: 2020-09-12 — End: 2020-10-31

## 2020-09-12 MED ORDER — LISDEXAMFETAMINE DIMESYLATE 70 MG PO CAPS: 70.0000 mg | ORAL_CAPSULE | Freq: Every morning | ORAL | 0 refills | Status: DC

## 2020-09-12 MED ORDER — LISDEXAMFETAMINE DIMESYLATE 70 MG PO CAPS: 70.0000 mg | ORAL_CAPSULE | Freq: Every day | ORAL | 0 refills | Status: DC

## 2020-09-12 NOTE — Progress Notes (Signed)
[place] MD/PA/NP OP Progress Note  Virtual Visit via Telephone Note  I connected with Tracy Weiss on 09/12/20 at  8:30 AM EST by telephone and verified that I am speaking with the correct person using two identifiers.  Location: Patient: home Provider: Clinic   I discussed the limitations, risks, security and privacy concerns of performing an evaluation and management service by telephone and the availability of in person appointments. I also discussed with the patient that there may be a patient responsible charge related to this service. The patient expressed understanding and agreed to proceed.   I provided 14 minutes of non-face-to-face time during this encounter.     09/12/2020 8:30 AM Tracy Weiss  MRN:  [id number]  Chief Complaint: " I'm doing okay."  HPI: Patient reported overall she is doing well.  She informed that in October she suffered from a concussion after her head hit it been resulted in a laceration.  She stated that ever since that incident she has had moments when she feels more forgetful than usual.  She informed that she recently saw her PCP last week and they prescribed her with hydroxyzine 25 mg 3 times daily as needed for anxiety.  She stated that she does not take it regularly and only uses it as needed when asked if it is safe to be combined with Vyvanse. She reported that currently she is working from home and that ever since her head injury in October she has been advised not to drive.  She now has insurance as well. She stated that she is wondering if she should start counseling or therapy with someone.  She asked for recommendations. Writer informed her that the Probation officer can connect her with one of the in-house therapists for virtual appointments in the future, patient verbalized her agreement for this.  Visit Diagnosis:    ICD-10-CM   1. Attention deficit hyperactivity disorder (ADHD), predominantly inattentive type  F90.0     Past Psychiatric  History: ADHD  Past Medical History:  Past Medical History:  Diagnosis Date  . Abnormal uterine bleeding (AUB)   . ADHD   . Allergy   . Anxiety   . Diabetes mellitus without complication ([place])    type 2  . Gallstones 09/2016  . GERD (gastroesophageal reflux disease)    after gallbladder removed  . Hyperlipidemia    per pt "taking as preventative"    Past Surgical History:  Procedure Laterality Date  . CESAREAN SECTION    . CHOLECYSTECTOMY N/A 09/11/2016   Procedure: LAPAROSCOPIC CHOLECYSTECTOMY WITH INTRAOPERATIVE CHOLANGIOGRAM;  Surgeon: Donnie Mesa, MD;  Location: [facility];  Service: General;  Laterality: N/A;  . DILATATION AND CURETTAGE/HYSTEROSCOPY WITH MINERVA N/A 02/04/2019   Procedure: DILATATION AND CURETTAGE/ABLATION WITH MINERVA;  Surgeon: Emily Filbert, MD;  Location: [facility];  Service: Gynecology;  Laterality: N/A;  MINERVA ABLATION  . IR RADIOLOGIST EVAL & MGMT  04/07/2020  . tubal ligaton reversal  2017    Family Psychiatric History: Family hx of ADHD in brothers, daughters and grandson  Family History:  Family History  Problem Relation Age of Onset  . Cancer Mother        Metastatic with Unknown Primary; Stomach was involved  . Diabetes Father   . Heart disease Father   . Breast cancer Neg Hx   . Colon cancer Neg Hx   . Esophageal cancer Neg Hx   . Rectal cancer Neg Hx   . Stomach cancer Neg Hx  Social History:  Social History   Socioeconomic History  . Marital status: Single    Spouse name: Not on file  . Number of children: Not on file  . Years of education: Not on file  . Highest education level: Master's degree (e.g., MA, MS, MEng, MEd, MSW, MBA)  Occupational History  . Not on file  Tobacco Use  . Smoking status: Never Smoker  . Smokeless tobacco: Never Used  Vaping Use  . Vaping Use: Never used  Substance and Sexual Activity  . Alcohol use: Yes    Alcohol/week: 0.0 standard drinks    Comment: rarely  . Drug use: No  .  Sexual activity: Not Currently    Birth control/protection: None  Other Topics Concern  . Not on file  Social History Narrative  . Not on file   Social Determinants of Health   Financial Resource Strain: Not on file  Food Insecurity: No Food Insecurity  . Worried About Charity fundraiser in the Last Year: Never true  . Ran Out of Food in the Last Year: Never true  Transportation Needs: No Transportation Needs  . Lack of Transportation (Medical): No  . Lack of Transportation (Non-Medical): No  Physical Activity: Not on file  Stress: Not on file  Social Connections: Not on file    Allergies: No Known Allergies  Metabolic Disorder Labs: Lab Results  Component Value Date   HGBA1C 6.1 (A) 09/09/2020   Lab Results  Component Value Date   PROLACTIN 11.5 10/07/2018   PROLACTIN 10.3 06/11/2012   Lab Results  Component Value Date   CHOL 193 12/22/2019   TRIG 98 12/22/2019   HDL 56 12/22/2019   CHOLHDL 3.4 12/22/2019   VLDL 26 08/13/2016   LDLCALC 119 (H) 12/22/2019   LDLCALC 98 09/23/2017   Lab Results  Component Value Date   TSH 0.694 10/07/2018   TSH 1.080 09/23/2017    Therapeutic Level Labs: No results found for: LITHIUM No results found for: VALPROATE No components found for:  CBMZ  Current Medications: Current Outpatient Medications  Medication Sig Dispense Refill  . aspirin 81 MG chewable tablet Chew by mouth daily.    . cycloSPORINE (RESTASIS) 0.05 % ophthalmic emulsion Place 1 drop into both eyes 2 (two) times daily as needed (dry eyes). 0.4 mL 6  . Elagolix-Estrad-Noreth & Elago (ORIAHNN) 300-1-0.5 & 300 MG CPPK Take 1 capsule by mouth in the morning and at bedtime. 60 each 1  . famotidine (PEPCID) 20 MG tablet Take 1 tablet (20 mg total) by mouth 2 (two) times daily. 60 tablet 0  . hydrOXYzine (ATARAX/VISTARIL) 25 MG tablet Take 1 tablet (25 mg total) by mouth 3 (three) times daily as needed. 30 tablet 3  . insulin aspart protamine - aspart (NOVOLOG MIX  70/30 FLEXPEN) (70-30) 100 UNIT/ML FlexPen Inject 0.5 mLs (50 Units total) into the skin daily with breakfast AND 0.4 mLs (40 Units total) daily with supper. 15 mL 11  . lisdexamfetamine (VYVANSE) 70 MG capsule Take 1 capsule (70 mg total) by mouth in the morning. 30 capsule 0  . lisdexamfetamine (VYVANSE) 70 MG capsule Take 1 capsule (70 mg total) by mouth in the morning. 30 capsule 0  . medroxyPROGESTERone (PROVERA) 10 MG tablet Take 2 tablets (20 mg total) by mouth daily. May return to one pill daily when bleeding stops. 180 tablet 3  . metroNIDAZOLE (METROGEL) 0.75 % vaginal gel Place 1 Applicatorful vaginally at bedtime. 70 g 0  . nortriptyline (  PAMELOR) 25 MG capsule Take 1 capsule (25 mg total) by mouth at bedtime. 30 capsule 2  . olmesartan-hydrochlorothiazide (BENICAR HCT) 40-25 MG tablet Take 1 tablet by mouth daily. 90 tablet 1  . ondansetron (ZOFRAN ODT) 4 MG disintegrating tablet Take 1 tablet (4 mg total) by mouth every 8 (eight) hours as needed for nausea or vomiting. 20 tablet 0  . Prenatal Vit-Fe Fumarate-FA (PRENATAL VITAMIN PO) Take 1 tablet by mouth.     . rosuvastatin (CRESTOR) 20 MG tablet Take 1 tablet (20 mg total) by mouth daily. 90 tablet 1  . Semaglutide,0.25 or 0.5MG /DOS, (OZEMPIC, 0.25 OR 0.5 MG/DOSE,) 2 MG/1.5ML SOPN Inject 0.25 mg into the skin once a week. (Patient taking differently: Inject 0.5 mg into the skin once a week. ) 1 pen 0   No current facility-administered medications for this visit.      Psychiatric Specialty Exam: Review of Systems  There were no vitals taken for this visit.There is no height or weight on file to calculate BMI.  General Appearance: unable to assess due to phone visit  Eye Contact: unable to assess due to phone visit  Speech:  Clear and Coherent and Normal Rate  Volume:  Normal  Mood:  Euthymic  Affect:  Congruent  Thought Process:  Goal Directed, Linear and Descriptions of Associations: Intact  Orientation:  Full (Time, Place,  and Person)  Thought Content: Logical   Suicidal Thoughts:  No  Homicidal Thoughts:  No  Memory:  Recent;   Good Remote;   Good  Judgement:  Good  Insight:  Good  Psychomotor Activity:  Normal  Concentration:  Concentration: Good and Attention Span: Good  Recall:  Good  Fund of Knowledge: Good  Language: Good  Akathisia:  Negative  Handed:  Right  AIMS (if indicated): not done  Assets:  Communication Skills Desire for Improvement Financial Resources/Insurance Housing  ADL's:  Intact  Cognition: WNL  Sleep:  Good   Screenings: GAD-7   Flowsheet Row Office Visit from 07/13/2020 in [facility] Office Visit from 12/03/2019 in [place] for [facility]  Total GAD-7 Score 1 7    PHQ2-9   [place] Office Visit from 09/09/2020 in [place] Office Visit from 07/13/2020 in [facility] [place] at [facility] Office Visit from 03/01/2020 in [place] Office Visit from 12/22/2019 in [place] Office Visit from 12/03/2019 in [facility] for [facility]  PHQ-2 Total Score 1 0 2 0 3  PHQ-9 Total Score 4 2 - - 10       Assessment and Plan: Patient reported increased forgetfulness since the concussion that occurred in October.  She is feeling slightly better now and wants to be held to a therapist.  We'll connect Vyvanse 70 mg daily for now and patient was advised to use hydroxyzine 25 mg 3 times daily as needed prescribed by her PCP for anxiety.  1. Attention deficit hyperactivity disorder (ADHD), predominantly inattentive type  -  lisdexamfetamine (VYVANSE) 70 MG capsule; Take 1 capsule (70 mg total) by mouth in the morning.  Dispense: 30 capsule; Refill: 0 - lisdexamfetamine (VYVANSE) 70 MG capsule; Take 1 capsule (70 mg total) by mouth in the morning.  Dispense: 30 capsule; Refill: 0 - lisdexamfetamine  (VYVANSE) 70 MG capsule; Take 1 capsule (70 mg total) by mouth in the morning.  Dispense: 30 capsule; Refill: 0  Continue same regimen. F/up in 3 months. Referred to in-house therapist.  [place] Crane, MD 09/12/2020, 8:30 AM

## 2020-09-15 ENCOUNTER — Encounter: Payer: Self-pay | Admitting: Family Medicine

## 2020-09-24 ENCOUNTER — Other Ambulatory Visit: Payer: No Typology Code available for payment source

## 2020-10-10 ENCOUNTER — Telehealth: Payer: Self-pay | Admitting: Pharmacist

## 2020-10-10 ENCOUNTER — Other Ambulatory Visit: Payer: Self-pay

## 2020-10-10 ENCOUNTER — Ambulatory Visit
Admission: RE | Admit: 2020-10-10 | Discharge: 2020-10-10 | Disposition: A | Discharge status: Home / Self Care | Payer: BLUE CROSS/BLUE SHIELD | Source: Ambulatory Visit | Attending: Family Medicine | Admitting: Family Medicine

## 2020-10-10 DIAGNOSIS — M2548 Effusion, other site: Secondary | ICD-10-CM | POA: Diagnosis not present

## 2020-10-10 DIAGNOSIS — R4182 Altered mental status, unspecified: Secondary | ICD-10-CM | POA: Diagnosis not present

## 2020-10-10 DIAGNOSIS — H538 Other visual disturbances: Secondary | ICD-10-CM

## 2020-10-10 DIAGNOSIS — S060X0A Concussion without loss of consciousness, initial encounter: Secondary | ICD-10-CM

## 2020-10-10 DIAGNOSIS — R413 Other amnesia: Secondary | ICD-10-CM | POA: Diagnosis not present

## 2020-10-10 DIAGNOSIS — H5711 Ocular pain, right eye: Secondary | ICD-10-CM

## 2020-10-10 DIAGNOSIS — H748X3 Other specified disorders of middle ear and mastoid, bilateral: Secondary | ICD-10-CM | POA: Diagnosis not present

## 2020-10-10 MED ORDER — TRANEXAMIC ACID 650 MG PO TABS: 1300.0000 mg | ORAL_TABLET | Freq: Three times a day (TID) | ORAL | 3 refills | Status: DC

## 2020-10-10 NOTE — Telephone Encounter (Signed)
Patient called and requested support with sample Ozempic.  Patient reports doing well with blood sugar and weight.   Agreed to supply samples as patient is meeting treatment goals without adverse effects.   Medication Samples have been provided to the patient.  Drug name: Ozempic (semaglutide)         Qty: 2 pens LOT: [id number]  Exp.Date: 06/03/2023 LOT: [id number] Exp.Date: 10/03/2022  Dosing instructions: continue same dose 0.5mg  weekly  The patient has been instructed regarding the correct time, dose, and frequency of taking this medication, including desired effects and most common side effects.   Tracy Weiss 9:41 AM 10/10/2020

## 2020-10-10 NOTE — Telephone Encounter (Signed)
Noted and agree. 

## 2020-10-10 NOTE — Progress Notes (Signed)
MRI brain is reassuring.  No brain bleeds or tumors are seen.  Recommend return to clinic to review the results and discuss current state of concussion.

## 2020-10-11 ENCOUNTER — Encounter: Payer: Self-pay | Admitting: Family Medicine

## 2020-10-11 ENCOUNTER — Encounter: Payer: Self-pay | Admitting: Neurology

## 2020-10-11 DIAGNOSIS — F0781 Postconcussional syndrome: Secondary | ICD-10-CM

## 2020-10-11 DIAGNOSIS — R9082 White matter disease, unspecified: Secondary | ICD-10-CM

## 2020-10-13 NOTE — Progress Notes (Unsigned)
Subjective:    Chief Complaint: Blair Promise, LAT, ATC, am serving as scribe for Dr. [name].  Tracy Weiss,  is a 55 y.o. female who presents for brain MRI review and f/u of a concussion that occurred on 06/27/20 when she was struck in the head by a swing set that she was trying to disassemble.  She was last seen by Dr. [name] on 08/03/20 and noted problems w/ vision in her R eye, memory, decreased processing speed and difficulty focusing/mental fogginess.  She has been seen by ophthalmology and was referred to neuro ophthalmology at her last visit.  She was also advised to con't using nortryptaline at bedtime.  Since her last visit, pt reports that she con't to have pain at her forehead where she was hit by the swing set and notes itching in that area.  She is con't to have issues w/ her short term memory.  She will have intermittent floaters especially at night and con't to have photosensitivity particularly in the sunlight.  Injury date : 06/27/20 Visit #: 3   History of Present Illness:    Concussion Self-Reported Symptom Score Symptoms rated on a scale 1-6, in last 24 hours   Headache: 0    Nausea: 0  Dizziness: 0  Vomiting: 0  Balance Difficulty: 2   Trouble Falling Asleep: 4   Fatigue: 3  Sleep Less Than Usual: 5  Daytime Drowsiness: 0  Sleep More Than Usual: 0  Photophobia: 0  Phonophobia: 4  Irritability: 0  Sadness: 0  Numbness or Tingling: 0  Nervousness: 0  Feeling More Emotional: 0  Feeling Mentally Foggy: 5  Feeling Slowed Down: 5  Memory Problems: 5  Difficulty Concentrating: 5  Visual Problems: 5   Total # of Symptoms: 10/22 Total Symptom Score: 43/132 Previous Total # of Symptoms: 14/22 Previous Symptom Score: 64/132   Neck Pain: No  Tinnitus: Yes intermittently  Review of Systems: No fevers or chills  Review of History: ADHD.  Diabetes hypertension hyperlipidemia  Objective:    Physical Examination Vitals:   10/14/20 0816  BP:  110/80  Pulse: 86  SpO2: 99%   Neuro: Normal coordination and gait Psych: Normal speech thought process and affect.  Radiology:  MR BRAIN WO CONTRAST  Result Date: 10/10/2020 CLINICAL DATA:  Memory loss.  Mental status change, unknown cause. EXAM: MRI HEAD WITHOUT CONTRAST TECHNIQUE: Multiplanar, multiecho pulse sequences of the brain and surrounding structures were obtained without intravenous contrast. COMPARISON:  None. FINDINGS: Brain: No acute infarction, hemorrhage, hydrocephalus, extra-axial collection or mass lesion. A few scattered foci of T2 hyperintensity are seen within the white matter of the cerebral hemispheres, nonspecific. Vascular: Normal flow voids. Skull and upper cervical spine: Normal marrow signal. Sinuses/Orbits: Negative. Other: Minimal bilateral mastoid effusion IMPRESSION: 1. No acute intracranial abnormality. 2. A few scattered foci of T2 hyperintensity within the white matter of the cerebral hemispheres are nonspecific but may be seen in the setting of chronic microvascular ischemic disease. 3. Minimal bilateral mastoid effusion. Electronically Signed   By: Pedro Earls M.D.   On: 10/10/2020 12:36   I, [name], personally (independently) visualized and performed the interpretation of the images attached in this note.   Assessment and Plan   55 y.o. female with postconcussion syndrome.  Patient still has a fair amount of symptoms especially related to memory and concentration.  She does have ADHD but I think her current symptoms are more related to her concussion and her  history of ADHD.  I think she still has some improvement left but no specific treatment is indicated at this time for me.  We will recheck in 3 months.  If not significantly improved at that time consider neuropsychological evaluation.  That will be at the 71-month mark.  Patient does continue to have neurological symptoms and does have an mildly abnormal brain MRI with T2  hyperintensities concerning for chronic microvascular ischemic disease.  This is very likely secondary to her hypertension and diabetes.  Discussed changes.  Patient would like a referral to neurology which I think is reasonable for second opinion.  Neuro referral already placed.      Action/Discussion: Reviewed diagnosis, management options, expected outcomes, and the reasons for scheduled and emergent follow-up. Questions were adequately answered. Patient expressed verbal understanding and agreement with the following plan.     Patient Education:  Reviewed with patient the risks (i.e, a repeat concussion, post-concussion syndrome, second-impact syndrome) of returning to play prior to complete resolution, and thoroughly reviewed the signs and symptoms of concussion.Reviewed need for complete resolution of all symptoms, with rest AND exertion, prior to return to play.  Reviewed red flags for urgent medical evaluation: worsening symptoms, nausea/vomiting, intractable headache, musculoskeletal changes, focal neurological deficits.  [facility]'s Concussion Care Plan, which clearly outlines the plans stated above, was given to patient.   In addition to the time spent performing tests, I spent 30 min   Discussed MRI findings treatment plan and options.   After Visit Summary printed out and provided to patient as appropriate.  The above documentation has been reviewed and is accurate and complete [name]

## 2020-10-14 ENCOUNTER — Ambulatory Visit (INDEPENDENT_AMBULATORY_CARE_PROVIDER_SITE_OTHER): Payer: BLUE CROSS/BLUE SHIELD | Admitting: Family Medicine

## 2020-10-14 ENCOUNTER — Other Ambulatory Visit: Payer: Self-pay

## 2020-10-14 ENCOUNTER — Encounter: Payer: Self-pay | Admitting: Family Medicine

## 2020-10-14 VITALS — BP 110/80 | HR 86 | Ht 61.0 in | Wt 166.4 lb

## 2020-10-14 DIAGNOSIS — R9082 White matter disease, unspecified: Secondary | ICD-10-CM | POA: Diagnosis not present

## 2020-10-14 DIAGNOSIS — F0781 Postconcussional syndrome: Secondary | ICD-10-CM

## 2020-10-14 MED ORDER — TRAZODONE HCL 50 MG PO TABS: 50.0000 mg | ORAL_TABLET | Freq: Every day | ORAL | 1 refills | Status: DC

## 2020-10-14 NOTE — Patient Instructions (Signed)
Thank you for coming in today.  Follow up with Neurology.  Recheck with me in 3 months.   If not significantly better I will arrange for a neuropsychological evaluation at the 6-12 month mark to see what your baseline memory and content ration is.   Try Trazodone.   Continue current job restrictions.

## 2020-10-15 ENCOUNTER — Other Ambulatory Visit: Payer: No Typology Code available for payment source

## 2020-10-24 ENCOUNTER — Inpatient Hospital Stay: Admission: RE | Admit: 2020-10-24 | Payer: No Typology Code available for payment source | Source: Ambulatory Visit

## 2020-10-27 ENCOUNTER — Telehealth (HOSPITAL_COMMUNITY): Payer: Self-pay | Admitting: *Deleted

## 2020-10-27 NOTE — Telephone Encounter (Signed)
I am sorry that she is upset. I know [name] has been working on [employer]. Please tell her that we will let her know as soon as it is approved. I know that she has insurance now, so not sure why there is so much delay.

## 2020-10-27 NOTE — Telephone Encounter (Signed)
Spoke with Pam,CMA & she noted that patient insurance [employer] sent a formulary adjustment form which you did sign & she faxed on yesterday

## 2020-10-27 NOTE — Telephone Encounter (Signed)
Patient has called this AM a little upset that the her lisdexamfetamine (VYVANSE) 70 MG capsule P.A. hasn't been done. Well her Rx tells her they have sent the P.A. hasn't been sent. Tracy Weiss has been waiting on it for a few days now.  Well tried to explain process to patient  & she's frustrated & requesting to speak with provider.  Also stated that she would like to go back on Methylphenidate if getting the P.A. is going to be a problem.

## 2020-10-29 ENCOUNTER — Other Ambulatory Visit: Payer: Self-pay

## 2020-10-29 ENCOUNTER — Ambulatory Visit
Admission: RE | Admit: 2020-10-29 | Discharge: 2020-10-29 | Disposition: A | Discharge status: Home / Self Care | Payer: BLUE CROSS/BLUE SHIELD | Source: Ambulatory Visit | Attending: *Deleted | Admitting: *Deleted

## 2020-10-29 DIAGNOSIS — Z1231 Encounter for screening mammogram for malignant neoplasm of breast: Secondary | ICD-10-CM | POA: Diagnosis not present

## 2020-10-31 ENCOUNTER — Telehealth (HOSPITAL_COMMUNITY): Payer: Self-pay | Admitting: *Deleted

## 2020-10-31 MED ORDER — METHYLPHENIDATE HCL ER (LA) 30 MG PO CP24: 30.0000 mg | ORAL_CAPSULE | Freq: Every morning | ORAL | 0 refills | Status: DC

## 2020-10-31 NOTE — Telephone Encounter (Signed)
As previous attempts when calling patient you get a FAST-BUSY ring. Will wait for patient to return call  & As provider request will ask/discuss about previous medication & dose taken. Attempted x 2 prior sending meassage.

## 2020-10-31 NOTE — Addendum Note (Signed)
Addended by: [place] Crane on: 10/31/2020 12:29 PM   Modules accepted: Orders

## 2020-10-31 NOTE — Telephone Encounter (Signed)
Please inform the patient regarding this and confirm with her what medication was she taking prior to this and at what dose.  Writer will send that medication to her preferred pharmacy after confirmation.

## 2020-10-31 NOTE — Telephone Encounter (Signed)
BC/BS of [place]-- DENIAL  lisdexamfetamine (VYVANSE) 70 MG capsule. REQUEST DOES NOT MEET DEFINITION OF  MEDICAL NECESSITY FOUND IN MEMBER'S  BENEFIT BOOKLET.      PATIENT NOTED EARLIER PREVIOUS WEEK THAT IF SHE COULDN'T GET  VYVANSE APPROVED SHE WOULD LIKE TO GO BACK ON PREVIOUS MED.

## 2020-10-31 NOTE — Telephone Encounter (Signed)
I went back and checked, it was Ritalin LA 30 mg. I am sending the Rx now.

## 2020-11-01 ENCOUNTER — Telehealth: Payer: Self-pay | Admitting: Family Medicine

## 2020-11-01 MED ORDER — TRAZODONE HCL 50 MG PO TABS: 50.0000 mg | ORAL_TABLET | Freq: Every day | ORAL | 1 refills | Status: DC

## 2020-11-01 NOTE — Telephone Encounter (Signed)
Patient is requesting her refill on traZODone (DESYREL) 50 MG tablet to be sent in as a 90 day supply.  Please advise.

## 2020-11-01 NOTE — Telephone Encounter (Signed)
Medicine sent 90-day supply

## 2020-11-02 NOTE — Telephone Encounter (Signed)
Pt was sent a message via My Chart. 

## 2020-11-21 ENCOUNTER — Ambulatory Visit (INDEPENDENT_AMBULATORY_CARE_PROVIDER_SITE_OTHER): Payer: BLUE CROSS/BLUE SHIELD | Admitting: Neurology

## 2020-11-21 ENCOUNTER — Encounter: Payer: Self-pay | Admitting: Neurology

## 2020-11-21 ENCOUNTER — Other Ambulatory Visit: Payer: Self-pay

## 2020-11-21 VITALS — BP 117/84 | HR 88 | Ht 62.0 in | Wt 168.0 lb

## 2020-11-21 DIAGNOSIS — F0781 Postconcussional syndrome: Secondary | ICD-10-CM

## 2020-11-21 NOTE — Progress Notes (Signed)
[facility] Clinic Note - Initial Visit   Date: 11/21/20  Tracy Weiss MRN: [id number] DOB: 02/17/1966   Dear Dr. Ouida Sills:  Thank you for your kind referral of Tracy Weiss for consultation of abnormal MRI. Although her history is well known to you, please allow [name] to reiterate it for the purpose of our medical record. The patient was accompanied to the clinic by self.    History of Present Illness: Tracy Weiss is a 54 y.o. right-handed female presenting for to discuss MRI findings.  She blunt injury to her head on 06/27/2020 when a beam hit her head while she was trying to disassemble a swing set.  She lost consciousness for unknown period of time.  She was taken to the ER and had forehead laceration sutured.  Since this time, she had been having double vision, nasal congestion, numbness over the right scalp, difficulty with memory, slowed processing, and some confusion.  She is followed by Dr. [name] for post-concussion syndrome. She had MRI brain in February which showed mild white matter changes, otherwise normal. She is here to discuss these findings.  Over time, her visual symptoms have improved, but she continues to struggle with delayed processing and slowed response.   She works part-time answering calls. Lives at home with daughter.    Out-side paper records, electronic medical record, and images have been reviewed where available and summarized as:  Lab Results  Component Value Date   HGBA1C 6.1 (A) 09/09/2020   Lab Results  Component Value Date   [id number] 10/15/2014   Lab Results  Component Value Date   TSH 0.694 10/07/2018   No results found for: ESRSEDRATE, POCTSEDRATE  Past Medical History:  Diagnosis Date  . Abnormal uterine bleeding (AUB)   . ADHD   . Allergy   . Anxiety   . Diabetes mellitus without complication ([place])    type 2  . Gallstones 09/2016  . GERD (gastroesophageal reflux disease)    after  gallbladder removed  . Hyperlipidemia    per pt "taking as preventative"    Past Surgical History:  Procedure Laterality Date  . CESAREAN SECTION    . CHOLECYSTECTOMY N/A 09/11/2016   Procedure: LAPAROSCOPIC CHOLECYSTECTOMY WITH INTRAOPERATIVE CHOLANGIOGRAM;  Surgeon: Donnie Mesa, MD;  Location: [facility];  Service: General;  Laterality: N/A;  . DILATATION AND CURETTAGE/HYSTEROSCOPY WITH MINERVA N/A 02/04/2019   Procedure: DILATATION AND CURETTAGE/ABLATION WITH MINERVA;  Surgeon: Emily Filbert, MD;  Location: [facility];  Service: Gynecology;  Laterality: N/A;  MINERVA ABLATION  . IR RADIOLOGIST EVAL & MGMT  04/07/2020  . tubal ligaton reversal  2017     Medications:  Outpatient Encounter Medications as of 11/21/2020  Medication Sig  . hydrOXYzine (ATARAX/VISTARIL) 25 MG tablet Take 1 tablet (25 mg total) by mouth 3 (three) times daily as needed.  . insulin aspart protamine - aspart (NOVOLOG MIX 70/30 FLEXPEN) (70-30) 100 UNIT/ML FlexPen Inject 0.5 mLs (50 Units total) into the skin daily with breakfast AND 0.4 mLs (40 Units total) daily with supper.  . medroxyPROGESTERone (PROVERA) 10 MG tablet Take 2 tablets (20 mg total) by mouth daily. May return to one pill daily when bleeding stops.  . methylphenidate (RITALIN LA) 30 MG 24 hr capsule Take 1 capsule (30 mg total) by mouth in the morning.  . metroNIDAZOLE (METROGEL) 0.75 % vaginal gel Place 1 Applicatorful vaginally at bedtime.  Marland Kitchen olmesartan-hydrochlorothiazide (BENICAR HCT) 40-25 MG tablet Take 1 tablet by  mouth daily.  . ondansetron (ZOFRAN ODT) 4 MG disintegrating tablet Take 1 tablet (4 mg total) by mouth every 8 (eight) hours as needed for nausea or vomiting.  . Prenatal Vit-Fe Fumarate-FA (PRENATAL VITAMIN PO) Take 1 tablet by mouth.   . rosuvastatin (CRESTOR) 20 MG tablet Take 1 tablet (20 mg total) by mouth daily.  . Semaglutide,0.25 or 0.5MG /DOS, (OZEMPIC, 0.25 OR 0.5 MG/DOSE,) 2 MG/1.5ML SOPN Inject 0.25 mg into the  skin once a week. (Patient taking differently: Inject 0.5 mg into the skin once a week.)  . tranexamic acid (LYSTEDA) 650 MG TABS tablet Take 2 tablets (1,300 mg total) by mouth 3 (three) times daily. During cycle  . traZODone (DESYREL) 50 MG tablet Take 1 tablet (50 mg total) by mouth at bedtime.  Marland Kitchen aspirin 81 MG chewable tablet Chew by mouth daily. (Patient not taking: Reported on 11/21/2020)  . cycloSPORINE (RESTASIS) 0.05 % ophthalmic emulsion Place 1 drop into both eyes 2 (two) times daily as needed (dry eyes). (Patient not taking: Reported on 11/21/2020)  . famotidine (PEPCID) 20 MG tablet Take 1 tablet (20 mg total) by mouth 2 (two) times daily. (Patient not taking: Reported on 11/21/2020)  . [START ON 11/29/2020] methylphenidate (RITALIN LA) 30 MG 24 hr capsule Take 1 capsule (30 mg total) by mouth in the morning. (Patient not taking: Reported on 11/21/2020)   No facility-administered encounter medications on file as of 11/21/2020.    Allergies: No Known Allergies  Family History: Family History  Problem Relation Age of Onset  . Cancer Mother        Metastatic with Unknown Primary; Stomach was involved  . Diabetes Father   . Heart disease Father   . Breast cancer Neg Hx   . Colon cancer Neg Hx   . Esophageal cancer Neg Hx   . Rectal cancer Neg Hx   . Stomach cancer Neg Hx     Social History: Social History   Tobacco Use  . Smoking status: Never Smoker  . Smokeless tobacco: Never Used  Vaping Use  . Vaping Use: Never used  Substance Use Topics  . Alcohol use: Yes    Alcohol/week: 0.0 standard drinks    Comment: rarely  . Drug use: No   Social History   Social History Narrative  . Not on file    Vital Signs:  BP 117/84   Pulse 88   Ht 5\' 2"  (1.575 m)   Wt 168 lb (76.2 kg)   SpO2 99%   BMI 30.73 kg/m   Neurological Exam: MENTAL STATUS including orientation to time, place, person, recent and remote memory, attention span and concentration, language, and fund of  knowledge is normal.  Speech is not dysarthric. Formal cognitive testing declined.  CRANIAL NERVES: II:  No visual field defects.     III-IV-VI: Pupils equal round and reactive to light.  Normal conjugate, extra-ocular eye movements in all directions of gaze.  No nystagmus.  No ptosis.   V:  Normal facial sensation.    VII:  Normal facial symmetry and movements.   VIII:  Normal hearing and vestibular function.   IX-X:  Normal palatal movement.   XI:  Normal shoulder shrug and head rotation.   XII:  Normal tongue strength and range of motion, no deviation or fasciculation.  MOTOR:  No atrophy, fasciculations or abnormal movements.  No pronator drift.   Upper Extremity:  Right  Left  Deltoid  5/5   5/5   Biceps  5/5  5/5   Triceps  5/5   5/5   Infraspinatus 5/5  5/5  Medial pectoralis 5/5  5/5  Wrist extensors  5/5   5/5   Wrist flexors  5/5   5/5   Finger extensors  5/5   5/5   Finger flexors  5/5   5/5   Dorsal interossei  5/5   5/5   Abductor pollicis  5/5   5/5   Tone (Ashworth scale)  0  0   Lower Extremity:  Right  Left  Hip flexors  5/5   5/5   Hip extensors  5/5   5/5   Adductor 5/5  5/5  Abductor 5/5  5/5  Knee flexors  5/5   5/5   Knee extensors  5/5   5/5   Dorsiflexors  5/5   5/5   Plantarflexors  5/5   5/5   Toe extensors  5/5   5/5   Toe flexors  5/5   5/5   Tone (Ashworth scale)  0  0   MSRs:  Right        Left                  brachioradialis 2+  2+  biceps 2+  2+  triceps 2+  2+  patellar 2+  2+  ankle jerk 2+  2+  Hoffman no  no  plantar response down  down   SENSORY:  Normal and symmetric perception of light touch, pinprick, vibration, and proprioception.  Romberg's sign absent.   COORDINATION/GAIT: Normal finger-to- nose-finger and heel-to-shin.  Intact rapid alternating movements bilaterally.  Able to rise from a chair without using arms.  Gait narrow based and stable. Tandem and stressed gait intact.    IMPRESSION: Post-concussion  syndrome.    With respect to her MRI brain, I personally viewed the images with patient which shows very sparse white matter change, much within the normal range for patient's age.  I do not see anything worrisome which would be causing her symptoms.  Patient was reassured that both her neurological exam and MRI brain is normal. Symptoms are due to post concussive syndrome and should slowly improve with time.  Formal cognitive testing was declined.  She will continue to follow-up with Dr. [name] for concussion management.    Thank you for allowing me to participate in patient's care.  If I can answer any additional questions, I would be pleased to do so.    Sincerely,    Lavell Ridings K. Posey Pronto, DO

## 2020-11-23 ENCOUNTER — Telehealth (HOSPITAL_COMMUNITY): Payer: Self-pay | Admitting: Psychiatry

## 2020-11-23 ENCOUNTER — Telehealth (HOSPITAL_COMMUNITY): Payer: Self-pay | Admitting: *Deleted

## 2020-11-23 MED ORDER — AMPHETAMINE-DEXTROAMPHET ER 30 MG PO CP24: 30.0000 mg | ORAL_CAPSULE | Freq: Every morning | ORAL | 0 refills | Status: DC

## 2020-11-23 NOTE — Telephone Encounter (Signed)
Patient called LVM with front office that she has been waiting on returned call. I responded with the notes in the system . Patient stated this is unacceptable & stated she' request for Dr Toy Care to call her because she knows which Rx her medication goes to. I attempted to clean up her pharmacy perefernce list & she stated this is not my concern. I was able to remove one of the list after informing I would send Dr Toy Care a phone message about her concern.

## 2020-11-23 NOTE — Telephone Encounter (Signed)
I called and spoke with the pt and she reported that she does not think the medicine now is helping her as well as Vyvanse was.  She stated that Vyvanse was probably helping her focus better but also helped her keep her appetite in control.  That was indirectly helping her with her blood sugars. She asked what is the medication that is the closest to Vyvanse.  Writer told her that Adderall XR is similar to Vyvanse as they both amphetamine-based medications. She stated that she still disheartened that her insurance would not cover the Vyvanse but she is willing to try Adderall XR. Writer informed her that a prescription for Adderall XR 30 mg to be sent to her preferred pharmacy and we will touch base on April 4 at the time of her next appointment. Patient was agreeable to this plan.

## 2020-11-30 NOTE — Progress Notes (Signed)
    Tracy Weiss is a 55 y.o. female who presents to [place] at [facility] today for concussion follow-up after seeing neurology on 11/21/20.  Her initial injury occurred on 06/27/20 when she was struck in the head by a swing set that she was trying to disassemble.  She was last seen by Dr. [name] on 10/14/20 and noted con't pain at her forehead where she was hit by the swing set, short term memory problems and issues w/ her vision.  She was referred to both neuro ophthalmology and neurology.  Since her last visit, pt reports neuro told her it would take awhile until her symptoms improve. Pt reports she is not going to be able to complete her work.  Diagnostic imaging: Brain MRI- 10/10/20; CT head w/o contrast- 06/27/20  Pertinent review of systems: No fevers or chills  Relevant historical information: ADHD.  Diabetes.   Exam:  BP 128/84 (BP Location: Left Arm, Patient Position: Sitting, Cuff Size: Normal)   Pulse 85   Ht 5\' 2"  (1.575 m)   Wt 164 lb 3.2 oz (74.5 kg)   SpO2 100%   BMI 30.03 kg/m  General: Well Developed, well nourished, and in no acute distress.   Neuropsych: Alert and oriented normal coordination and gait.  Normal speech thought process and affect.    Assessment and Plan: 55 y.o. female with postconcussion syndrome.  Patient is now 6 months out from her concussion.  She has had improvement but still has residual symptoms that are bothersome enough that she is having trouble completing some tasks at work.  It seems as though her job is not compatible with her current abilities.  I have drafted a letter stating this.  Is possible that she may have some reassignment at work or may need to seek new employment.  She already has good treatment for her ADHD and is attempting to modify her current medications which may be helpful.  Recommend continuing current regimen and at the 64-month mark if still bothersome would consider neuropsychological evaluation  to further characterize her current abilities and defects.    Discussed warning signs or symptoms. Please see discharge instructions. Patient expresses understanding.   The above documentation has been reviewed and is accurate and complete [name], M.D.

## 2020-12-01 ENCOUNTER — Other Ambulatory Visit: Payer: Self-pay

## 2020-12-01 ENCOUNTER — Ambulatory Visit (INDEPENDENT_AMBULATORY_CARE_PROVIDER_SITE_OTHER): Payer: BLUE CROSS/BLUE SHIELD | Admitting: Family Medicine

## 2020-12-01 VITALS — BP 128/84 | HR 85 | Ht 62.0 in | Wt 164.2 lb

## 2020-12-01 DIAGNOSIS — F0781 Postconcussional syndrome: Secondary | ICD-10-CM | POA: Diagnosis not present

## 2020-12-01 HISTORY — DX: Postconcussional syndrome: F07.81

## 2020-12-01 NOTE — Patient Instructions (Signed)
Thank you for coming in today.  Continue current treatment.  At the 12 month mark we can do a neuropsychological evaluation.   Let me know if you need anything before then.

## 2020-12-05 ENCOUNTER — Encounter (HOSPITAL_COMMUNITY): Payer: Self-pay | Admitting: Psychiatry

## 2020-12-05 ENCOUNTER — Other Ambulatory Visit: Payer: Self-pay

## 2020-12-05 ENCOUNTER — Telehealth (INDEPENDENT_AMBULATORY_CARE_PROVIDER_SITE_OTHER): Payer: Self-pay | Admitting: Psychiatry

## 2020-12-05 DIAGNOSIS — F900 Attention-deficit hyperactivity disorder, predominantly inattentive type: Secondary | ICD-10-CM

## 2020-12-05 MED ORDER — AMPHETAMINE-DEXTROAMPHET ER 30 MG PO CP24: 30.0000 mg | ORAL_CAPSULE | Freq: Every morning | ORAL | 0 refills | Status: DC

## 2020-12-05 NOTE — Progress Notes (Signed)
[facility] MD/PA/NP OP Progress Note  Virtual Visit via Telephone Note  I connected with Tracy Weiss on 12/05/20 at  8:30 AM EDT by telephone and verified that I am speaking with the correct person using two identifiers.  Location: Patient: home Provider: Clinic   I discussed the limitations, risks, security and privacy concerns of performing an evaluation and management service by telephone and the availability of in person appointments. I also discussed with the patient that there may be a patient responsible charge related to this service. The patient expressed understanding and agreed to proceed.   I provided 15 minutes of non-face-to-face time during this encounter.     12/05/2020 8:28 AM Tracy Weiss  MRN:  [id number]  Chief Complaint: " The medicine is working."  HPI: Patient has been stable on her Vyvanse for quite some time however due to change in her insurance it was no longer covered.  Patient could not afford to pay for out-of-pocket therefore she asked to be switched back to Ritalin LA because she had taken it in the past. Patient took it for about a month but did not really find it to be helpful. She contacted the office and asked if she could be switched to something different that is similar to Vyvanse. Writer recommended being switched to Adderall XR since they both are in the amphetamine-based family. Patient started taking the Adderall XR about 2 weeks ago at today reported that she feels the medicine is quite effective. She stated that it is quite similar to Vyvanse that she is very happy with the result she is getting. She stated that she has no complaints or concerns. She informed that the only thing she is worried about is that she is about to switch jobs to get at therefore do insurance changes she is a little worried about any issues with affordability. Other than that she feels everything is going well. Her birthday is tomorrow and she is looking forward to  it.   Visit Diagnosis:    ICD-10-CM   1. Attention deficit hyperactivity disorder (ADHD), predominantly inattentive type  F90.0 amphetamine-dextroamphetamine (ADDERALL XR) 30 MG 24 hr capsule    amphetamine-dextroamphetamine (ADDERALL XR) 30 MG 24 hr capsule    amphetamine-dextroamphetamine (ADDERALL XR) 30 MG 24 hr capsule    Past Psychiatric History: ADHD  Past Medical History:  Past Medical History:  Diagnosis Date  . Abnormal uterine bleeding (AUB)   . ADHD   . Allergy   . Anxiety   . Diabetes mellitus without complication ([place])    type 2  . Gallstones 09/2016  . GERD (gastroesophageal reflux disease)    after gallbladder removed  . Hyperlipidemia    per pt "taking as preventative"    Past Surgical History:  Procedure Laterality Date  . CESAREAN SECTION    . CHOLECYSTECTOMY N/A 09/11/2016   Procedure: LAPAROSCOPIC CHOLECYSTECTOMY WITH INTRAOPERATIVE CHOLANGIOGRAM;  Surgeon: Donnie Mesa, MD;  Location: [facility];  Service: General;  Laterality: N/A;  . DILATATION AND CURETTAGE/HYSTEROSCOPY WITH MINERVA N/A 02/04/2019   Procedure: DILATATION AND CURETTAGE/ABLATION WITH MINERVA;  Surgeon: Emily Filbert, MD;  Location: [facility];  Service: Gynecology;  Laterality: N/A;  MINERVA ABLATION  . IR RADIOLOGIST EVAL & MGMT  04/07/2020  . tubal ligaton reversal  2017    Family Psychiatric History: Family hx of ADHD in brothers, daughters and grandson  Family History:  Family History  Problem Relation Age of Onset  . Cancer Mother  Metastatic with Unknown Primary; Stomach was involved  . Diabetes Father   . Heart disease Father   . Breast cancer Neg Hx   . Colon cancer Neg Hx   . Esophageal cancer Neg Hx   . Rectal cancer Neg Hx   . Stomach cancer Neg Hx     Social History:  Social History   Socioeconomic History  . Marital status: Single    Spouse name: Not on file  . Number of children: Not on file  . Years of education: Not on file  . Highest  education level: Master's degree (e.g., MA, MS, MEng, MEd, MSW, MBA)  Occupational History  . Not on file  Tobacco Use  . Smoking status: Never Smoker  . Smokeless tobacco: Never Used  Vaping Use  . Vaping Use: Never used  Substance and Sexual Activity  . Alcohol use: Yes    Alcohol/week: 0.0 standard drinks    Comment: rarely  . Drug use: No  . Sexual activity: Not Currently    Birth control/protection: None  Other Topics Concern  . Not on file  Social History Narrative  . Not on file   Social Determinants of Health   Financial Resource Strain: Not on file  Food Insecurity: No Food Insecurity  . Worried About Charity fundraiser in the Last Year: Never true  . Ran Out of Food in the Last Year: Never true  Transportation Needs: No Transportation Needs  . Lack of Transportation (Medical): No  . Lack of Transportation (Non-Medical): No  Physical Activity: Not on file  Stress: Not on file  Social Connections: Not on file    Allergies: No Known Allergies  Metabolic Disorder Labs: Lab Results  Component Value Date   HGBA1C 6.1 (A) 09/09/2020   Lab Results  Component Value Date   PROLACTIN 11.5 10/07/2018   PROLACTIN 10.3 06/11/2012   Lab Results  Component Value Date   CHOL 193 12/22/2019   TRIG 98 12/22/2019   HDL 56 12/22/2019   CHOLHDL 3.4 12/22/2019   VLDL 26 08/13/2016   LDLCALC 119 (H) 12/22/2019   LDLCALC 98 09/23/2017   Lab Results  Component Value Date   TSH 0.694 10/07/2018   TSH 1.080 09/23/2017    Therapeutic Level Labs: No results found for: LITHIUM No results found for: VALPROATE No components found for:  CBMZ  Current Medications: Current Outpatient Medications  Medication Sig Dispense Refill  . [START ON 01/04/2021] amphetamine-dextroamphetamine (ADDERALL XR) 30 MG 24 hr capsule Take 1 capsule (30 mg total) by mouth in the morning. 30 capsule 0  . [START ON 02/03/2021] amphetamine-dextroamphetamine (ADDERALL XR) 30 MG 24 hr capsule Take 1  capsule (30 mg total) by mouth in the morning. 30 capsule 0  . amphetamine-dextroamphetamine (ADDERALL XR) 30 MG 24 hr capsule Take 1 capsule (30 mg total) by mouth in the morning. 30 capsule 0  . hydrOXYzine (ATARAX/VISTARIL) 25 MG tablet Take 1 tablet (25 mg total) by mouth 3 (three) times daily as needed. 30 tablet 3  . insulin aspart protamine - aspart (NOVOLOG MIX 70/30 FLEXPEN) (70-30) 100 UNIT/ML FlexPen Inject 0.5 mLs (50 Units total) into the skin daily with breakfast AND 0.4 mLs (40 Units total) daily with supper. 15 mL 11  . medroxyPROGESTERone (PROVERA) 10 MG tablet Take 2 tablets (20 mg total) by mouth daily. May return to one pill daily when bleeding stops. 180 tablet 3  . metroNIDAZOLE (METROGEL) 0.75 % vaginal gel Place 1 Applicatorful vaginally at  bedtime. 70 g 0  . olmesartan-hydrochlorothiazide (BENICAR HCT) 40-25 MG tablet Take 1 tablet by mouth daily. 90 tablet 1  . ondansetron (ZOFRAN ODT) 4 MG disintegrating tablet Take 1 tablet (4 mg total) by mouth every 8 (eight) hours as needed for nausea or vomiting. 20 tablet 0  . Prenatal Vit-Fe Fumarate-FA (PRENATAL VITAMIN PO) Take 1 tablet by mouth.     . rosuvastatin (CRESTOR) 20 MG tablet Take 1 tablet (20 mg total) by mouth daily. 90 tablet 1  . Semaglutide,0.25 or 0.5MG /DOS, (OZEMPIC, 0.25 OR 0.5 MG/DOSE,) 2 MG/1.5ML SOPN Inject 0.25 mg into the skin once a week. (Patient taking differently: Inject 0.5 mg into the skin once a week.) 1 pen 0  . tranexamic acid (LYSTEDA) 650 MG TABS tablet Take 2 tablets (1,300 mg total) by mouth 3 (three) times daily. During cycle 30 tablet 3  . traZODone (DESYREL) 50 MG tablet Take 1 tablet (50 mg total) by mouth at bedtime. 90 tablet 1   No current facility-administered medications for this visit.      Psychiatric Specialty Exam: Review of Systems  There were no vitals taken for this visit.There is no height or weight on file to calculate BMI.  General Appearance: unable to assess due to  phone visit  Eye Contact: unable to assess due to phone visit  Speech:  Clear and Coherent and Normal Rate  Volume:  Normal  Mood:  Euthymic  Affect:  Congruent  Thought Process:  Goal Directed, Linear and Descriptions of Associations: Intact  Orientation:  Full (Time, Place, and Person)  Thought Content: Logical   Suicidal Thoughts:  No  Homicidal Thoughts:  No  Memory:  Recent;   Good Remote;   Good  Judgement:  Good  Insight:  Good  Psychomotor Activity:  Normal  Concentration:  Concentration: Good and Attention Span: Good  Recall:  Good  Fund of Knowledge: Good  Language: Good  Akathisia:  Negative  Handed:  Right  AIMS (if indicated): not done  Assets:  Communication Skills Desire for Improvement Financial Resources/Insurance Housing  ADL's:  Intact  Cognition: WNL  Sleep:  Good   Screenings: GAD-7   Flowsheet Row Office Visit from 07/13/2020 in [facility] for [facility] at [facility] Office Visit from 12/03/2019 in [place] for [facility]  Total GAD-7 Score 1 7    PHQ2-9   [place] Office Visit from 09/09/2020 in [place] Office Visit from 07/13/2020 in Center for [place] at [facility] Office Visit from 03/01/2020 in [place] Office Visit from 12/22/2019 in [place] Office Visit from 12/03/2019 in Center for [facility]  PHQ-2 Total Score 1 0 2 0 3  PHQ-9 Total Score 4 2 -- -- 10       Assessment and Plan: Patient seems to be doing better on Adderall XR as compared to being on Ritalin LA.  She denied any other concerns today.  1. Attention deficit hyperactivity disorder (ADHD), predominantly inattentive type - amphetamine-dextroamphetamine (ADDERALL XR) 30 MG 24 hr capsule; Take 1 capsule (30 mg total) by mouth in the morning.  Dispense: 30 capsule; Refill: 0 - amphetamine-dextroamphetamine (ADDERALL  XR) 30 MG 24 hr capsule; Take 1 capsule (30 mg total) by mouth in the morning.  Dispense: 30 capsule; Refill: 0 - amphetamine-dextroamphetamine (ADDERALL XR) 30 MG 24 hr capsule; Take 1 capsule (30 mg total) by mouth in the  morning.  Dispense: 30 capsule; Refill: 0   Continue same regimen. F/up in 3 months.   [place] Crane, MD 12/05/2020, 8:28 AM

## 2020-12-12 ENCOUNTER — Ambulatory Visit: Payer: BLUE CROSS/BLUE SHIELD | Admitting: Neurology

## 2021-01-12 NOTE — Progress Notes (Deleted)
   I, Wendy Poet, LAT, ATC, am serving as scribe for Dr. [name].  Tanette Chauca Paternostro is a 55 y.o. female who presents to [place] at [facility] today for post-concussion f/u from an initial injury that occurred on 06/27/20 when she was struck in the head by a swing set that she was trying to disassemble.  She last saw Dr. [name] on 12/01/20 for f/u after seeing neurology on 11/21/20.  At that time, pt noted that she was no able to completed her job duties due to her con't symptoms and a letter was written to her employer.  Since her last visit, pt reports   Diagnostic imaging: Brain MRI- 10/10/20; CT head w/o contrast- 06/27/20  Pertinent review of systems: ***  Relevant historical information: ***   Exam:  There were no vitals taken for this visit. General: Well Developed, well nourished, and in no acute distress.   MSK: ***    Lab and Radiology Results No results found for this or any previous visit (from the past 72 hour(s)). No results found.     Assessment and Plan: 55 y.o. female with ***   PDMP not reviewed this encounter. No orders of the defined types were placed in this encounter.  No orders of the defined types were placed in this encounter.    Discussed warning signs or symptoms. Please see discharge instructions. Patient expresses understanding.   ***

## 2021-01-13 ENCOUNTER — Ambulatory Visit: Payer: BLUE CROSS/BLUE SHIELD | Admitting: Family Medicine

## 2021-01-23 ENCOUNTER — Telehealth: Payer: Self-pay | Admitting: Pharmacist

## 2021-01-23 NOTE — Telephone Encounter (Signed)
Patient called 5/20 (Friday) and I was out of the office.    Follow-up call for her request of Ozempic - 3 pens  Contacted patient and shared that we did not have any samples of Ozempic currently.   She shared that she is out of work currently.  Her contract job ended.    Following some discussion of options, patient stated that without any insurance coverage currently she would be unable to afford any GLP.   I shared that I would have Hortencia Pilar, CPhT contact her to determine potential qualification for manufacturer supply of Ozempic OR an alternative Trulicity.  Patient was open to the option of trying to get medication from manufacturer.  I am unsure if her financial income will allow her to qualify.

## 2021-01-23 NOTE — Telephone Encounter (Signed)
Noted and agree. 

## 2021-01-31 ENCOUNTER — Telehealth: Payer: Self-pay | Admitting: Family Medicine

## 2021-01-31 NOTE — Telephone Encounter (Signed)
Patient is calling and scheduled appointment with Dr. Ouida Sills to discuss issues with her bowels. She did not want to see anyone sooner. She asks that Dr. Ouida Sills calls her to discuss this issue.   The best call back number is [phone]

## 2021-02-01 NOTE — Telephone Encounter (Signed)
Routed to PCP. Tracy Weiss, CMA  

## 2021-02-06 ENCOUNTER — Other Ambulatory Visit: Payer: Self-pay

## 2021-02-06 ENCOUNTER — Telehealth: Payer: Self-pay | Admitting: Family Medicine

## 2021-02-06 ENCOUNTER — Ambulatory Visit (INDEPENDENT_AMBULATORY_CARE_PROVIDER_SITE_OTHER): Payer: Self-pay | Admitting: Family Medicine

## 2021-02-06 VITALS — BP 112/72 | HR 65

## 2021-02-06 DIAGNOSIS — B349 Viral infection, unspecified: Secondary | ICD-10-CM | POA: Insufficient documentation

## 2021-02-06 DIAGNOSIS — R059 Cough, unspecified: Secondary | ICD-10-CM

## 2021-02-06 HISTORY — DX: Viral infection, unspecified: B34.9

## 2021-02-06 NOTE — Progress Notes (Signed)
    SUBJECTIVE:   CHIEF COMPLAINT / HPI: "not feeling well"   Tracy Weiss is a 55 year old female presenting to a same-day visit to discuss cough, congestion, fatigue.  Started with a cough on Friday. Feels like her throat has been "on fire" after coughing so much over the weekend. She has been feeling hot/sweaty since yesterday, no objective fever but hasn't checked her temp at home. She is fully vaccinated against COVID. She has not had COVID before. No myalgias but feeling tired/fatigued. Runny nose/congestion. She has been resting in bed over the weekend. Got her flu shot this season. She is taking vitamin C, ginger, and theraflu with some help. Used tylenol once, didn't feel like she took enough (used her grandsons).  She is trying to stay hydrated.  She denies any shortness of breath or confusion, having some chest congestion with coughing.  A1c has been well controlled. CBGs running around 112 at home.  Has an appointment scheduled with her PCP on 6/9.  PERTINENT  PMH / PSH: T2DM, HTN, IBS, GAD, ADHD  OBJECTIVE:   BP 112/72   Pulse 65   SpO2 96%   General: Alert, NAD, nontoxic appearing HEENT: NCAT, MMM, oropharynx nonerythematous with no tonsillar hypertrophy or exudate, bilateral TM clear with appropriate light reflex, no cervical lymphadenopathy, some nasal congestion present. Cardiac: RRR no m/g/r Lungs: Clear bilaterally without any wheezing or crackles, no increased WOB on room air, speaking in full sentences without concern Abdomen: soft Msk: Normal gait Ext: Warm, dry, 2+ distal pulses  ASSESSMENT/PLAN:   Viral illness Overall presentation sounds most consistent with viral syndrome.  Reassured that she is well-appearing and breathing comfortably with clear pulmonary exam.  Swabbed for COVID, will follow up results.  Encouraged adequate hydration and conservative measures including Tylenol, cough drops, tea with honey etc.     ED precautions discussed, especially if any  difficulty breathing.  Follow-up if not improving/worsening, already has scheduled follow-up with PCP on 6/9.  Fortunately COVID test should return by that time.  Patriciaann Clan, [place]

## 2021-02-06 NOTE — Telephone Encounter (Signed)
**  After Hours/ Emergency Line Call**  Received a call to report that [facility] flu like symptoms on Thursday.  Endorses having sore throat, non productive cough with runny nose.  Taken Thera flu for two days.  No fevers or shortness of breath. No recent sick contacts.  Has had all three COVID vaccines. Has not had recent COVID testing.  Requesting an appointment for tomorrow.  Discussed with patient that this line was for Emergency calls only.  Patient  reports she has been calling this line for 15 years doctor makes appointments and stated she would call back to speak with another doctor.  I informed her that I was the only physician on tonight and I do no normally make appointments on the Emergency line however given that this is something she has become accustomed to I made the appointment  for Access to Care for tomorrow at 250 pm.  Red flags discussed.  Will forward to PCP.  Carollee Leitz MD PGY-2, [facility] 02/06/2021 1:15 AM

## 2021-02-06 NOTE — Patient Instructions (Signed)
I am so sorry that you don't feel well!   We checked you for COVID today-- this will return in 24-48hrs and will be on mychart. Please stay at home away from others while awaiting this result.   Please stay hydrated. You can use tylenol 1000 mg up to 3-4 times daily as needed. You can keep using the supplements you have at home. Can try tea with some honey (be mindful of your sugar level) and cough drops to soothe your throat.

## 2021-02-06 NOTE — Assessment & Plan Note (Signed)
Overall presentation sounds most consistent with viral syndrome.  Reassured that she is well-appearing and breathing comfortably with clear pulmonary exam.  Swabbed for COVID, will follow up results.  Encouraged adequate hydration and conservative measures including Tylenol, cough drops, tea with honey etc.

## 2021-02-08 NOTE — Progress Notes (Signed)
Patient rescheduled due to pending COVID results.    Milus Banister, [place], PGY-3 02/09/2021 8:48 AM

## 2021-02-09 ENCOUNTER — Telehealth: Payer: Self-pay | Admitting: *Deleted

## 2021-02-09 ENCOUNTER — Telehealth: Payer: Self-pay | Admitting: Family Medicine

## 2021-02-09 ENCOUNTER — Ambulatory Visit (INDEPENDENT_AMBULATORY_CARE_PROVIDER_SITE_OTHER): Payer: Medicaid Other | Admitting: Family Medicine

## 2021-02-09 DIAGNOSIS — K589 Irritable bowel syndrome without diarrhea: Secondary | ICD-10-CM

## 2021-02-09 LAB — NOVEL CORONAVIRUS, NAA: SARS-CoV-2, NAA: DETECTED — AB

## 2021-02-09 NOTE — Telephone Encounter (Signed)
Pt calling again. States she is looking for on call doctor for [facility]. States "I always call the hospital number and they give me a doctor on call." Offered triage for medical issue, declines states "I'll call hospital, that where I though I was calling."

## 2021-02-09 NOTE — Telephone Encounter (Signed)
Pt called on [employer]. Per agent wanted to speak to on call doctor "For [facility]." Pt hung up before transferred to triage. Attempted to reach pt, 3rd party VM, did not leave message.

## 2021-02-09 NOTE — Telephone Encounter (Signed)
**  After Hours/ Emergency Line Call**  Received a page to call [facility].  Pt is COVID positive. Endorsing intermittent dizziness and lightheadedness.  Denying chest pain and worsening shortness of breath.  Requests Rx for COVID treatment given risk factors. Prefers [facility] [employer] on [address]. Red flags discussed and ED precautions given.  Will forward to PCP and Dr. Higinio Plan.    Lyndee Hensen, DO PGY-2, [facility] 02/09/2021 6:56 PM

## 2021-02-10 ENCOUNTER — Telehealth: Payer: Self-pay | Admitting: Family Medicine

## 2021-02-10 NOTE — Telephone Encounter (Signed)
Patient tested positive for COVID.  Symptom onset 02/03/2021, now on day 7 of illness.  Call patient to discuss this morning, reports she feels improved since onset still with a little lightheadedness and cough.  Denies any CP/SOB.  Given her co-morbidities (T2DM, elevated BMI), she had requested an antiviral for treatment.  Unfortunately, since she is >5 days (and now on day 7) would not qualify for paxlovid or monoclonal antibodies.  However, provided reassurance as she is triple vaccinated and already feeling improvement. She likely has already experienced the most severe part of her illness and would have minimal benefit from antivirals anyways.  Encourage continued home treatment with her vitamin D, C, zinc, and home remedies.  Encouraged adequate hydration, Tylenol/ibuprofen as needed.  ED precautions discussed.  She is already scheduled for follow-up with PCP on 6/13, which will be day 10.  Patriciaann Clan, DO

## 2021-02-12 NOTE — Progress Notes (Signed)
Patient cancelled her 02/13/2021 appt for Bowel issues.   Milus Banister, [place], PGY-3 02/13/2021 2:03 PM

## 2021-02-12 NOTE — Patient Instructions (Addendum)
Erroneous

## 2021-02-13 ENCOUNTER — Ambulatory Visit (INDEPENDENT_AMBULATORY_CARE_PROVIDER_SITE_OTHER): Payer: Medicaid Other | Admitting: Family Medicine

## 2021-02-13 DIAGNOSIS — K589 Irritable bowel syndrome without diarrhea: Secondary | ICD-10-CM

## 2021-02-22 ENCOUNTER — Telehealth: Payer: Self-pay | Admitting: Family Medicine

## 2021-02-22 MED ORDER — BENZONATATE 100 MG PO CAPS: 100.0000 mg | ORAL_CAPSULE | Freq: Two times a day (BID) | ORAL | 0 refills | Status: DC | PRN

## 2021-02-22 MED ORDER — ALBUTEROL SULFATE HFA 108 (90 BASE) MCG/ACT IN AERS: 1.0000 | INHALATION_SPRAY | Freq: Four times a day (QID) | RESPIRATORY_TRACT | 0 refills | Status: DC | PRN

## 2021-02-22 NOTE — Telephone Encounter (Signed)
AFTER HOURS CALL   Patient reports she was diagnosed with COVID 3 weeks ago.  Today she reports that she started feeling "bad" that included sore throat, steady cough. She reports having heaviness with breathing and states that she is constantly needing to take deep breaths. She denies lightheadedness with standing and feeling dizzy. She has been taking theraflu, vitamin C and fluids. She denies HA, denies nausea. She has not had fevers for two weeks but on Sunday she felt warm but did not take her temperature at that time. She states that on Thursday/Friday her temp was 97 degrees. She reports some clear nasal discharge. Cough is sometimes productive of phelgm, denies hemoptysis. She denies needing to position herself differently in order to breath adequately. She has not noticed any wheezing.  She has denies feelings of sinus pressure. Patient states she does not have a pulse oximetry device. She has been able to drink tea with honey, lemon and ginger.   Will Rx tessalon perles & inhaler  Discussed ED precautions including worsening respiratory status, if she decides to purchase the pulse ox device recommended evaluation if % if less than 92. Patient voiced understanding and was in agreement with plan  Continue to push oral fluids Recommended Vic's patches and sleeping with humidifier to help with keeping airways comfortable and less irritated    Eulis Foster, [place], PGY-2  [place] [facility] Pager [phone]

## 2021-02-28 ENCOUNTER — Encounter (HOSPITAL_COMMUNITY): Payer: Self-pay | Admitting: Psychiatry

## 2021-02-28 ENCOUNTER — Other Ambulatory Visit: Payer: Self-pay

## 2021-02-28 ENCOUNTER — Telehealth (INDEPENDENT_AMBULATORY_CARE_PROVIDER_SITE_OTHER): Payer: No Payment, Other | Admitting: Psychiatry

## 2021-02-28 DIAGNOSIS — F900 Attention-deficit hyperactivity disorder, predominantly inattentive type: Secondary | ICD-10-CM

## 2021-02-28 MED ORDER — AMPHETAMINE-DEXTROAMPHET ER 20 MG PO CP24: ORAL_CAPSULE | ORAL | 0 refills | Status: DC

## 2021-02-28 NOTE — Progress Notes (Signed)
[place] MD/PA/NP OP Progress Note  Virtual Visit via Telephone Note  I connected with Tracy Weiss on 02/28/21 at  8:30 AM EDT by telephone and verified that I am speaking with the correct person using two identifiers.  Location: Patient: home Provider: Clinic   I discussed the limitations, risks, security and privacy concerns of performing an evaluation and management service by telephone and the availability of in person appointments. I also discussed with the patient that there may be a patient responsible charge related to this service. The patient expressed understanding and agreed to proceed.   I provided 15 minutes of non-face-to-face time during this encounter.     02/28/2021 8:55 AM Tracy Weiss  MRN:  [id number]  Chief Complaint: " I still think that Vyvanse worked better for me."  HPI: Patient stated that overall she is doing well however she still thinks that Vyvanse worked better for her compared to Adderall XR. Patient used to be on Vyvanse 70 mg daily for the past many years however due to insurance changes earlier this year her insurance refuses to cover Vyvanse anymore.  She currently takes Adderall XR 30 mg and stated that although it helps her it is not as effective as Vyvanse 70 mg dose was. She stated that she has looked into some manufacturing discount options but even then the cost would be kind of very high for her to afford. Writer informed her that the coupons that we have in the clinic are for patients who have some form of insurance and if they utilizes coupons with their insurance they can get good discounts but unfortunately since she does not have any form of insurance those coupons will not be a good fit for her use.  Patient stated that she is wondering if the dose of Adderall XR can be adjusted.  Writer recommended that we adjust the dose of Adderall XR to 40 mg daily for optimal results and see how that works for her.  Writer also informed her that  Adderall XR 40 mg dose will be equivalent to Vyvanse 70 mg dose. Patient was happy to hear that and stated that she is agreeable to trying a higher dose to see if that would benefit her better.  Apart from this she is doing well overall.  Her work life and personal life are going well.  She is sleeping well at night.  She denies any other concerns today.  Visit Diagnosis:    ICD-10-CM   1. Attention deficit hyperactivity disorder (ADHD), predominantly inattentive type  F90.0 amphetamine-dextroamphetamine (ADDERALL XR) 20 MG 24 hr capsule    amphetamine-dextroamphetamine (ADDERALL XR) 20 MG 24 hr capsule    amphetamine-dextroamphetamine (ADDERALL XR) 20 MG 24 hr capsule       Past Psychiatric History: ADHD  Past Medical History:  Past Medical History:  Diagnosis Date   Abnormal uterine bleeding (AUB)    ADHD    Allergy    Anxiety    Diabetes mellitus without complication ([place])    type 2   Gallstones 09/2016   GERD (gastroesophageal reflux disease)    after gallbladder removed   Hyperlipidemia    per pt "taking as preventative"    Past Surgical History:  Procedure Laterality Date   CESAREAN SECTION     CHOLECYSTECTOMY N/A 09/11/2016   Procedure: LAPAROSCOPIC CHOLECYSTECTOMY WITH INTRAOPERATIVE CHOLANGIOGRAM;  Surgeon: Donnie Mesa, MD;  Location: [facility];  Service: General;  Laterality: N/A;   DILATATION AND CURETTAGE/HYSTEROSCOPY WITH MINERVA N/A 02/04/2019  Procedure: DILATATION AND CURETTAGE/ABLATION WITH MINERVA;  Surgeon: Emily Filbert, MD;  Location: [facility];  Service: Gynecology;  Laterality: N/A;  MINERVA ABLATION   IR RADIOLOGIST EVAL & MGMT  04/07/2020   tubal ligaton reversal  2017    Family Psychiatric History: Family hx of ADHD in brothers, daughters and grandson  Family History:  Family History  Problem Relation Age of Onset   Cancer Mother        Metastatic with Unknown Primary; Stomach was involved   Diabetes Father    Heart disease Father     Breast cancer Neg Hx    Colon cancer Neg Hx    Esophageal cancer Neg Hx    Rectal cancer Neg Hx    Stomach cancer Neg Hx     Social History:  Social History   Socioeconomic History   Marital status: Single    Spouse name: Not on file   Number of children: Not on file   Years of education: Not on file   Highest education level: Master's degree (e.g., MA, MS, MEng, MEd, MSW, MBA)  Occupational History   Not on file  Tobacco Use   Smoking status: Never   Smokeless tobacco: Never  Vaping Use   Vaping Use: Never used  Substance and Sexual Activity   Alcohol use: Yes    Alcohol/week: 0.0 standard drinks    Comment: rarely   Drug use: No   Sexual activity: Not Currently    Birth control/protection: None  Other Topics Concern   Not on file  Social History Narrative   Not on file   Social Determinants of Health   Financial Resource Strain: Not on file  Food Insecurity: No Food Insecurity   Worried About Running Out of Food in the Last Year: Never true   Ran Out of Food in the Last Year: Never true  Transportation Needs: No Transportation Needs   Lack of Transportation (Medical): No   Lack of Transportation (Non-Medical): No  Physical Activity: Not on file  Stress: Not on file  Social Connections: Not on file    Allergies: No Known Allergies  Metabolic Disorder Labs: Lab Results  Component Value Date   HGBA1C 6.1 (A) 09/09/2020   Lab Results  Component Value Date   PROLACTIN 11.5 10/07/2018   PROLACTIN 10.3 06/11/2012   Lab Results  Component Value Date   CHOL 193 12/22/2019   TRIG 98 12/22/2019   HDL 56 12/22/2019   CHOLHDL 3.4 12/22/2019   VLDL 26 08/13/2016   LDLCALC 119 (H) 12/22/2019   LDLCALC 98 09/23/2017   Lab Results  Component Value Date   TSH 0.694 10/07/2018   TSH 1.080 09/23/2017    Therapeutic Level Labs: No results found for: LITHIUM No results found for: VALPROATE No components found for:  CBMZ  Current Medications: Current  Outpatient Medications  Medication Sig Dispense Refill   amphetamine-dextroamphetamine (ADDERALL XR) 20 MG 24 hr capsule Take 2 capsules in the morning 60 capsule 0   [START ON 03/29/2021] amphetamine-dextroamphetamine (ADDERALL XR) 20 MG 24 hr capsule Take 2 capsules in the morning 60 capsule 0   [START ON 04/28/2021] amphetamine-dextroamphetamine (ADDERALL XR) 20 MG 24 hr capsule Take 2 capsules in the morning 60 capsule 0   albuterol (VENTOLIN HFA) 108 (90 Base) MCG/ACT inhaler Inhale 1-2 puffs into the lungs every 6 (six) hours as needed for shortness of breath (or cough). 1 each 0   benzonatate (TESSALON) 100 MG capsule Take 1  capsule (100 mg total) by mouth 2 (two) times daily as needed for cough. 20 capsule 0   hydrOXYzine (ATARAX/VISTARIL) 25 MG tablet Take 1 tablet (25 mg total) by mouth 3 (three) times daily as needed. 30 tablet 3   insulin aspart protamine - aspart (NOVOLOG MIX 70/30 FLEXPEN) (70-30) 100 UNIT/ML FlexPen Inject 0.5 mLs (50 Units total) into the skin daily with breakfast AND 0.4 mLs (40 Units total) daily with supper. 15 mL 11   medroxyPROGESTERone (PROVERA) 10 MG tablet Take 2 tablets (20 mg total) by mouth daily. May return to one pill daily when bleeding stops. 180 tablet 3   metroNIDAZOLE (METROGEL) 0.75 % vaginal gel Place 1 Applicatorful vaginally at bedtime. 70 g 0   olmesartan-hydrochlorothiazide (BENICAR HCT) 40-25 MG tablet Take 1 tablet by mouth daily. 90 tablet 1   ondansetron (ZOFRAN ODT) 4 MG disintegrating tablet Take 1 tablet (4 mg total) by mouth every 8 (eight) hours as needed for nausea or vomiting. 20 tablet 0   Prenatal Vit-Fe Fumarate-FA (PRENATAL VITAMIN PO) Take 1 tablet by mouth.      rosuvastatin (CRESTOR) 20 MG tablet Take 1 tablet (20 mg total) by mouth daily. 90 tablet 1   tranexamic acid (LYSTEDA) 650 MG TABS tablet Take 2 tablets (1,300 mg total) by mouth 3 (three) times daily. During cycle 30 tablet 3   traZODone (DESYREL) 50 MG tablet Take 1  tablet (50 mg total) by mouth at bedtime. 90 tablet 1   No current facility-administered medications for this visit.      Psychiatric Specialty Exam: Review of Systems  There were no vitals taken for this visit.There is no height or weight on file to calculate BMI.  General Appearance: unable to assess due to phone visit  Eye Contact: unable to assess due to phone visit  Speech:  Clear and Coherent and Normal Rate  Volume:  Normal  Mood:  Euthymic  Affect:  Congruent  Thought Process:  Goal Directed, Linear and Descriptions of Associations: Intact  Orientation:  Full (Time, Place, and Person)  Thought Content: Logical   Suicidal Thoughts:  No  Homicidal Thoughts:  No  Memory:  Recent;   Good Remote;   Good  Judgement:  Good  Insight:  Good  Psychomotor Activity:  Normal  Concentration:  Concentration: Good and Attention Span: Good  Recall:  Good  Fund of Knowledge: Good  Language: Good  Akathisia:  Negative  Handed:  Right  AIMS (if indicated): not done  Assets:  Communication Skills Desire for Improvement Financial Resources/Insurance Housing  ADL's:  Intact  Cognition: WNL  Sleep:  Good   Screenings: GAD-7    Flowsheet Row Video Visit from 02/28/2021 in [facility] Office Visit from 07/13/2020 in [facility] [place] [facility] Office Visit from 12/03/2019 in [facility]  Total GAD-7 Score 2 1 7       PHQ2-9    Flowsheet Row Video Visit from 02/28/2021 in [facility] Office Visit from 09/09/2020 in [place] Office Visit from 07/13/2020 in [facility] [place] [facility] Office Visit from 03/01/2020 in [facility] Office Visit from 12/22/2019 in [place]  PHQ-2 Total Score 0 1 0 2 0  PHQ-9 Total Score -- 4 2 -- --      Flowsheet Row Video Visit  from 02/28/2021 in [facility]  C-SSRS RISK CATEGORY No Risk        Assessment and Plan: Patient is doing fairly well but still misses taking Vyvanse.  She was taking Vyvanse 70 mg dose for many years but due to insurance issues and affordability factor she had to be switched to Adderall XR.  We will adjust the dose of Adderall XR to 40 mg every morning and see if that helps her better.  1. Attention deficit hyperactivity disorder (ADHD), predominantly inattentive type  - Increase amphetamine-dextroamphetamine (ADDERALL XR) 20 MG 24 hr capsule; Take 2 capsules in the morning  Dispense: 60 capsule; Refill: 0 - amphetamine-dextroamphetamine (ADDERALL XR) 20 MG 24 hr capsule; Take 2 capsules in the morning  Dispense: 60 capsule; Refill: 0 - amphetamine-dextroamphetamine (ADDERALL XR) 20 MG 24 hr capsule; Take 2 capsules in the morning  Dispense: 60 capsule; Refill: 0   F/up in 3 months.  Patient was informed that writer is leaving the office and therefore her care is being transferred to a different provider in the clinic.  She verbalized her understanding and wished the writer the best.  [place] Crane, MD 02/28/2021, 8:55 AM

## 2021-03-02 ENCOUNTER — Ambulatory Visit (INDEPENDENT_AMBULATORY_CARE_PROVIDER_SITE_OTHER): Payer: Medicaid Other | Admitting: Family Medicine

## 2021-03-02 ENCOUNTER — Other Ambulatory Visit: Payer: Self-pay

## 2021-03-02 DIAGNOSIS — R058 Other specified cough: Secondary | ICD-10-CM

## 2021-03-02 NOTE — Patient Instructions (Signed)
Thank you for coming to see me today. It was a pleasure.  Your lungs sound clear.  If you have any shortness of breath or fevers please return to clinic  Please follow-up with PCP as needed  If you have any questions or concerns, please do not hesitate to call the office at [phone].  Best,   Carollee Leitz, MD

## 2021-03-02 NOTE — Progress Notes (Signed)
    SUBJECTIVE:   CHIEF COMPLAINT / HPI: would like lungs checked  Primary symptom: cough  Duration: 3 weeks Severity: mild, denies any chest pain, SOB or dyspnea Associated symptoms:none Fever? Tmax?: none Sick contacts:none Covid test: COVID positive 02/06/21 Covid vaccination(s):Received 4 vaccines   PERTINENT  PMH / PSH:  Recent COVID infection  OBJECTIVE:   BP 110/74   Pulse 92   Wt 154 lb (69.9 kg)   SpO2 98%   BMI 28.17 kg/m    General: Alert, no acute distress Cardio: Normal S1 and S2, RRR, no r/m/g Pulm: CTAB, normal work of breathing, no wheezing, crackles noted.   ASSESSMENT/PLAN:   Post-viral cough syndrome COVID positive 02/06/21.  Likely post viral cough from COVID.  No fevers, shortness of breath, productive cough and benign physical exam.  -Can continue honey teas -Discussed if worsening symptoms, fevers to return to clinic  -Follow up with PCP as needed     Carollee Leitz, MD [place]

## 2021-03-06 ENCOUNTER — Encounter: Payer: Self-pay | Admitting: Family Medicine

## 2021-03-06 DIAGNOSIS — R058 Other specified cough: Secondary | ICD-10-CM

## 2021-03-06 HISTORY — DX: Other specified cough: R05.8

## 2021-03-06 NOTE — Assessment & Plan Note (Signed)
COVID positive 02/06/21.  Likely post viral cough from COVID.  No fevers, shortness of breath, productive cough and benign physical exam.  -Can continue honey teas -Discussed if worsening symptoms, fevers to return to clinic  -Follow up with PCP as needed

## 2021-03-22 ENCOUNTER — Telehealth: Payer: Self-pay

## 2021-03-22 NOTE — Telephone Encounter (Signed)
Patient called in stating that she has been excessively bleeding. States this has been occurring since June. Is having constant heavy bleeding and clotting. States she does not want to speak with anyone besides Dr. Kennon Rounds.

## 2021-03-22 NOTE — Telephone Encounter (Signed)
Called patient, no answer- left message stating we have received her message and will pass it along to Dr Kennon Rounds when she returns to the office on 7/25. She may call us back if she would like to speak with a nurse.

## 2021-03-27 ENCOUNTER — Telehealth: Payer: Self-pay | Admitting: General Practice

## 2021-03-27 ENCOUNTER — Telehealth: Payer: Self-pay | Admitting: Family Medicine

## 2021-03-27 DIAGNOSIS — N939 Abnormal uterine and vaginal bleeding, unspecified: Secondary | ICD-10-CM

## 2021-03-27 NOTE — Telephone Encounter (Signed)
Scheduled ultrasound for 8/1 @ 1245 per Dr Kennon Rounds. Called to informed patient, no answer- left message stating we would send her a mychart message with additional information regarding her appt.

## 2021-03-27 NOTE — Telephone Encounter (Signed)
Patient called in with bleeding. No cycle x 2 months. Then heavy bleeding x 3 weeks. Took Lysteda and it stopped bleeding today.  Will check u/s and FSH and consider endometrial sampling. She is considering hysterectomy.

## 2021-03-27 NOTE — Telephone Encounter (Signed)
Returned patient's phone call and transferred call to Dr Kennon Rounds.

## 2021-04-03 ENCOUNTER — Other Ambulatory Visit: Payer: Self-pay

## 2021-04-03 ENCOUNTER — Ambulatory Visit
Admission: RE | Admit: 2021-04-03 | Discharge: 2021-04-03 | Disposition: A | Discharge status: Home / Self Care | Payer: Medicaid Other | Source: Ambulatory Visit | Attending: Family Medicine | Admitting: Family Medicine

## 2021-04-03 DIAGNOSIS — N939 Abnormal uterine and vaginal bleeding, unspecified: Secondary | ICD-10-CM | POA: Insufficient documentation

## 2021-04-11 ENCOUNTER — Other Ambulatory Visit: Payer: Self-pay | Admitting: Pharmacist

## 2021-04-11 DIAGNOSIS — E119 Type 2 diabetes mellitus without complications: Secondary | ICD-10-CM

## 2021-04-12 ENCOUNTER — Other Ambulatory Visit: Payer: Self-pay

## 2021-04-12 ENCOUNTER — Ambulatory Visit (INDEPENDENT_AMBULATORY_CARE_PROVIDER_SITE_OTHER): Payer: Medicaid Other | Admitting: Family Medicine

## 2021-04-12 ENCOUNTER — Encounter: Payer: Self-pay | Admitting: Family Medicine

## 2021-04-12 ENCOUNTER — Other Ambulatory Visit (HOSPITAL_COMMUNITY)
Admission: RE | Admit: 2021-04-12 | Discharge: 2021-04-12 | Disposition: A | Discharge status: Home / Self Care | Payer: Medicaid Other | Source: Ambulatory Visit | Attending: Family Medicine | Admitting: Family Medicine

## 2021-04-12 VITALS — BP 147/91 | Ht 62.0 in | Wt 154.9 lb

## 2021-04-12 DIAGNOSIS — N840 Polyp of corpus uteri: Secondary | ICD-10-CM | POA: Diagnosis not present

## 2021-04-12 DIAGNOSIS — N912 Amenorrhea, unspecified: Secondary | ICD-10-CM | POA: Diagnosis present

## 2021-04-12 DIAGNOSIS — N939 Abnormal uterine and vaginal bleeding, unspecified: Secondary | ICD-10-CM

## 2021-04-12 DIAGNOSIS — Z3202 Encounter for pregnancy test, result negative: Secondary | ICD-10-CM

## 2021-04-12 LAB — POCT PREGNANCY, URINE: Preg Test, Ur: NEGATIVE

## 2021-04-12 MED ORDER — NORETHINDRONE ACETATE 5 MG PO TABS: 5.0000 mg | ORAL_TABLET | Freq: Every day | ORAL | 2 refills | Status: DC

## 2021-04-12 NOTE — Progress Notes (Signed)
   Subjective:    Patient ID: Tracy Weiss is a 55 y.o. female presenting with Vaginal Bleeding  on 04/12/2021  HPI: Patient here for bleeding. Had had none x 2 months, then returned. Tired Lysteda and did not work. Pelvic sono shows adenomyosis vs. Fibroid.  Review of Systems  Constitutional:  Negative for chills and fever.  Respiratory:  Negative for shortness of breath.   Cardiovascular:  Negative for chest pain.  Gastrointestinal:  Negative for abdominal pain, nausea and vomiting.  Genitourinary:  Positive for vaginal bleeding. Negative for dysuria.  Skin:  Negative for rash.     Objective:    BP (!) 147/91   Ht '5\' 2"'$  (1.575 m)   Wt 154 lb 14.4 oz (70.3 kg)   BMI 28.33 kg/m  Physical Exam Constitutional:      General: She is not in acute distress.    Appearance: She is well-developed.  HENT:     Head: Normocephalic and atraumatic.  Eyes:     General: No scleral icterus. Cardiovascular:     Rate and Rhythm: Normal rate.  Pulmonary:     Effort: Pulmonary effort is normal.  Abdominal:     Palpations: Abdomen is soft.  Genitourinary:    Comments: BUS normal, vagina is pink and rugated, cervix is nulliparous without lesion  Musculoskeletal:     Cervical back: Neck supple.  Skin:    General: Skin is warm and dry.  Neurological:     Mental Status: She is alert and oriented to person, place, and time.    Procedure: Patient given informed consent, signed copy in the chart, time out was performed. Appropriate time out taken. . The patient was placed in the lithotomy position and the cervix brought into view with sterile speculum.  Portio of cervix cleansed x 2 with betadine swabs.  A tenaculum was placed in the anterior lip of the cervix.  The uterus was sounded for depth of 7 cm. A pipelle was introduced to into the uterus, suction created,  and an endometrial sample was obtained. All equipment was removed and accounted for.  The patient tolerated the procedure well.       Assessment & Plan:   Problem List Items Addressed This Visit       Unprioritized   Abnormal uterine bleeding    Adenomyosis vs. Fibroid. Treatment is the same. S/p EMB today to r/o significant pathology. Trial of daily Aygestin. May stop Lysteda.        Relevant Medications   norethindrone (AYGESTIN) 5 MG tablet   Other Relevant Orders   Follicle stimulating hormone   Pregnancy, urine POC (Completed)   Surgical pathology( [place])   Other Visit Diagnoses     Amenorrhea    -  Primary        Return if symptoms worsen or fail to improve.  Donnamae Jude 04/12/2021 5:41 PM

## 2021-04-12 NOTE — Assessment & Plan Note (Addendum)
Adenomyosis vs. Fibroid. Treatment is the same. S/p EMB today to r/o significant pathology. Trial of daily Aygestin. May stop Lysteda.

## 2021-04-13 LAB — FOLLICLE STIMULATING HORMONE: FSH: 14.8 m[IU]/mL

## 2021-04-17 LAB — SURGICAL PATHOLOGY

## 2021-04-18 ENCOUNTER — Telehealth: Payer: Self-pay | Admitting: Pharmacist

## 2021-04-18 NOTE — Telephone Encounter (Signed)
Patient called and left message requesting samples of Ozempic.   I returned call and left message that requested medication was unavailable as a sample as well as being in a [facility].   I shared that other medication options could be considered with PCP or Rx clinic visit.  Requested patient to determine next step.

## 2021-05-14 ENCOUNTER — Other Ambulatory Visit: Payer: Self-pay | Admitting: Family Medicine

## 2021-05-15 ENCOUNTER — Telehealth: Payer: Self-pay

## 2021-05-15 NOTE — Telephone Encounter (Signed)
Patient called in wanting provider to call her if possible. Stating she has had bleeding none stop for 4 days without being able to ge out the bed states its very heavy and dark. States she wants to sch her hysterectomy.  States provider started a medication and its not working at all    Please call and advise

## 2021-05-15 NOTE — Telephone Encounter (Signed)
Call placed back to pt. Pt states needing to change medication back to Lysteda due to Aygestin Rx causing weight gain and not helping with her bleeding. Pt states is having heavy bleeding with clots. Denies feeling dizzy, lightheaded, or weak. Pt advised if symptoms changed or worsening given her hx of anemia, then go to hospital to be seen.  Pt states was already taken Aygestin Rx twice daily one week after her OV with Dr Kennon Rounds on 04/12/21 and states has not helped her bleeding.  Pt states plans to go back on Lysteda Rx starting today.  Pt advised will inform Dr Kennon Rounds and will return call to pt tomorrow. Pt verbalized understanding and agreeable.   Colletta Maryland, RN

## 2021-05-15 NOTE — Telephone Encounter (Signed)
Forwarding to Dr Kennon Rounds, please advise   Colletta Maryland, RN

## 2021-05-16 ENCOUNTER — Telehealth: Payer: Self-pay | Admitting: Family Medicine

## 2021-05-16 NOTE — Telephone Encounter (Signed)
Still bleeding--will try for Mirena. Needs [employer] application ASAP--will pick up tomorrow.

## 2021-05-18 NOTE — Telephone Encounter (Signed)
Patient did not come to pick up application yesterday. Called pt to explain that our office is open until 12 PM today and tomorrow. Pt states she will come today or tomorrow to pick up application. I explained that she can request this from the front office and someone will come to speak with her about application.

## 2021-05-26 ENCOUNTER — Other Ambulatory Visit: Payer: Self-pay

## 2021-05-26 ENCOUNTER — Telehealth (INDEPENDENT_AMBULATORY_CARE_PROVIDER_SITE_OTHER): Payer: No Payment, Other | Admitting: Psychiatry

## 2021-05-26 ENCOUNTER — Encounter (HOSPITAL_COMMUNITY): Payer: Self-pay | Admitting: Psychiatry

## 2021-05-26 DIAGNOSIS — F900 Attention-deficit hyperactivity disorder, predominantly inattentive type: Secondary | ICD-10-CM

## 2021-05-26 MED ORDER — AMPHETAMINE-DEXTROAMPHET ER 20 MG PO CP24: ORAL_CAPSULE | ORAL | 0 refills | Status: DC

## 2021-05-26 NOTE — Progress Notes (Signed)
[place] MD/PA/NP OP Progress Note Virtual Visit via Telephone Note  I connected with Melody Haver on 05/26/21 at 11:30 AM EDT by telephone and verified that I am speaking with the correct person using two identifiers.  Location: Patient: home Provider: Clinic   I discussed the limitations, risks, security and privacy concerns of performing an evaluation and management service by telephone and the availability of in person appointments. I also discussed with the patient that there may be a patient responsible charge related to this service. The patient expressed understanding and agreed to proceed.   I provided 30 minutes of non-face-to-face time during this encounter.  05/26/2021 12:07 PM Mikhaila Roh  MRN:  [id number]  Chief Complaint: "Vyvance was the best medication for me"  HPI: 55 year old female seen today for follow up psychiatric evaluation. She is a former patient of Dr. Jean Rosenthal who is being transferred to writer for medication management. She has a psychiatric history of anxiety and ADHD. She is currently managed on Adderall XR 40 mg daily and notes that it is somewhat effective in managing her psychiatric conditions.   Today she was unable to login virtually so her assessment was done over the phone. During exam she was pleasant, cooperative, and engaged in conversation. She informed Probation officer that at her last visit Adderall was increased however she notes that it is not as effective as it once was. She endorses poor concentration, avoidance of mentally taxing task, poor listening skills, and forgetfulness. She notes that Vyvance worked better for her and reports that in the past she was on 70 mg. She informed Probation officer that her insurance was discontinued and she was not able to afford it. She asked writer if she was aware of any programs that could assist her in getting Vyvanse for an affordable price. Provider informed patient of [place] and called [facility] to confirm. Provider  was informed that [facility] has a patient care assistance program for Vyvanse. Patient told to contact [place] to start the process which she was agreeable. At this time Adderall not discontinued as processing of patient care assistance may take some time. Patient was agreeable to this.  Patient notes that she has been more anxious and depressed because she is not employed. She informed Probation officer that she is stressing financially and notes that at times she feels that she is failing because she is unemployed. Provider conducted a GAD 7 and patient scored a 7. Provider also conducted a PHQ 9 and patient scored a 9. She endorses adequate sleep and appetite. Today she denies SI/HI/VAH, mania, or paranoia.   No medication changes made today. Patient may restart Vyvance at a lower dose 40 mg if her patient care assistance is approved for the medication. No other concerns noted at this time.  Visit Diagnosis:    ICD-10-CM   1. Attention deficit hyperactivity disorder (ADHD), predominantly inattentive type  F90.0 amphetamine-dextroamphetamine (ADDERALL XR) 20 MG 24 hr capsule      Past Psychiatric History: ADHD and anxiety  Past Medical History:  Past Medical History:  Diagnosis Date   Abnormal uterine bleeding (AUB)    ADHD    Allergy    Anxiety    Diabetes mellitus without complication ([place])    type 2   Gallstones 09/2016   GERD (gastroesophageal reflux disease)    after gallbladder removed   Hyperlipidemia    per pt "taking as preventative"    Past Surgical History:  Procedure Laterality Date  CESAREAN SECTION     CHOLECYSTECTOMY N/A 09/11/2016   Procedure: LAPAROSCOPIC CHOLECYSTECTOMY WITH INTRAOPERATIVE CHOLANGIOGRAM;  Surgeon: Donnie Mesa, MD;  Location: [facility];  Service: General;  Laterality: N/A;   DILATATION AND CURETTAGE/HYSTEROSCOPY WITH MINERVA N/A 02/04/2019   Procedure: DILATATION AND CURETTAGE/ABLATION WITH MINERVA;  Surgeon: Emily Filbert, MD;  Location: [facility];  Service: Gynecology;  Laterality: N/A;  MINERVA ABLATION   IR RADIOLOGIST EVAL & MGMT  04/07/2020   tubal ligaton reversal  2017    Family Psychiatric History:  Family hx of ADHD in brothers, daughters and grandson    Family History:  Family History  Problem Relation Age of Onset   Cancer Mother        Metastatic with Unknown Primary; Stomach was involved   Diabetes Father    Heart disease Father    Breast cancer Neg Hx    Colon cancer Neg Hx    Esophageal cancer Neg Hx    Rectal cancer Neg Hx    Stomach cancer Neg Hx     Social History:  Social History   Socioeconomic History   Marital status: Single    Spouse name: Not on file   Number of children: Not on file   Years of education: Not on file   Highest education level: Master's degree (e.g., MA, MS, MEng, MEd, MSW, MBA)  Occupational History   Not on file  Tobacco Use   Smoking status: Never   Smokeless tobacco: Never  Vaping Use   Vaping Use: Never used  Substance and Sexual Activity   Alcohol use: Yes    Alcohol/week: 0.0 standard drinks    Comment: rarely   Drug use: No   Sexual activity: Not Currently    Birth control/protection: None  Other Topics Concern   Not on file  Social History Narrative   Not on file   Social Determinants of Health   Financial Resource Strain: Not on file  Food Insecurity: No Food Insecurity   Worried About Running Out of Food in the Last Year: Never true   Ran Out of Food in the Last Year: Never true  Transportation Needs: No Transportation Needs   Lack of Transportation (Medical): No   Lack of Transportation (Non-Medical): No  Physical Activity: Not on file  Stress: Not on file  Social Connections: Not on file    Allergies: No Known Allergies  Metabolic Disorder Labs: Lab Results  Component Value Date   HGBA1C 6.1 (A) 09/09/2020   Lab Results  Component Value Date   PROLACTIN 11.5 10/07/2018   PROLACTIN 10.3 06/11/2012   Lab Results  Component Value  Date   CHOL 193 12/22/2019   TRIG 98 12/22/2019   HDL 56 12/22/2019   CHOLHDL 3.4 12/22/2019   VLDL 26 08/13/2016   LDLCALC 119 (H) 12/22/2019   LDLCALC 98 09/23/2017   Lab Results  Component Value Date   TSH 0.694 10/07/2018   TSH 1.080 09/23/2017    Therapeutic Level Labs: No results found for: LITHIUM No results found for: VALPROATE No components found for:  CBMZ  Current Medications: Current Outpatient Medications  Medication Sig Dispense Refill   amphetamine-dextroamphetamine (ADDERALL XR) 20 MG 24 hr capsule Take 2 capsules in the morning 60 capsule 0   hydrOXYzine (ATARAX/VISTARIL) 25 MG tablet Take 1 tablet (25 mg total) by mouth 3 (three) times daily as needed. (Patient not taking: Reported on 04/12/2021) 30 tablet 3   metroNIDAZOLE (METROGEL) 0.75 % vaginal gel  Place 1 Applicatorful vaginally at bedtime. 70 g 0   norethindrone (AYGESTIN) 5 MG tablet Take 1 tablet (5 mg total) by mouth daily. 90 tablet 2   NOVOLOG 70/30 FLEXPEN (70-30) 100 UNIT/ML FlexPen INJECT 0.5MLS ( 50 UNITS TOTAL ) INTO THE SKIN DAILY WITH BREAKFAST AND 0.4 MLS ( 40 UNITS TOTAL ) DAILY WITH SUPPER 15 mL 0   olmesartan-hydrochlorothiazide (BENICAR HCT) 40-25 MG tablet TAKE ONE TABLET BY MOUTH DAILY 60 tablet 0   ondansetron (ZOFRAN ODT) 4 MG disintegrating tablet Take 1 tablet (4 mg total) by mouth every 8 (eight) hours as needed for nausea or vomiting. (Patient not taking: Reported on 04/12/2021) 20 tablet 0   Prenatal Vit-Fe Fumarate-FA (PRENATAL VITAMIN PO) Take 1 tablet by mouth.      rosuvastatin (CRESTOR) 20 MG tablet Take 1 tablet (20 mg total) by mouth daily. 90 tablet 1   tranexamic acid (LYSTEDA) 650 MG TABS tablet Take 2 tablets (1,300 mg total) by mouth 3 (three) times daily. During cycle 30 tablet 3   traZODone (DESYREL) 50 MG tablet Take 1 tablet (50 mg total) by mouth at bedtime. 90 tablet 1   No current facility-administered medications for this visit.     Musculoskeletal: Strength  & Muscle Tone:  Unable to assess due to telphone visit [place]:  Unable to assess due to telphone visit Patient leans: N/A  Psychiatric Specialty Exam: Review of Systems  There were no vitals taken for this visit.There is no height or weight on file to calculate BMI.  General Appearance:  Unable to assess due to telphone visit  Eye Contact:   Unable to assess due to telphone visit  Speech:  Clear and Coherent and Normal Rate  Volume:  Normal  Mood:  Euthymic and Notes anxious and depressed at times but is able to cope with it.  Affect:  Appropriate and Congruent  Thought Process:  Coherent, Goal Directed, and Linear  Orientation:  Full (Time, Place, and Person)  Thought Content: WDL and Logical   Suicidal Thoughts:  No  Homicidal Thoughts:  No  Memory:  Immediate;   Good Recent;   Good Remote;   Good  Judgement:  Good  Insight:  Good  Psychomotor Activity:  Normal  Concentration:  Concentration: Good and Attention Span: Good  Recall:  Good  Fund of Knowledge: Good  Language: Good  Akathisia:   Unable to assess due to telphone visit  Handed:  Right  AIMS (if indicated): not done  Assets:  Communication Skills Desire for [place]er Support  ADL's:  Intact  Cognition: WNL  Sleep:  Good   Screenings: GAD-7    Flowsheet Row Video Visit from 05/26/2021 in [facility] Office Visit from 04/12/2021 in [facility] [place] [facility] Video Visit from 02/28/2021 in [facility] Office Visit from 07/13/2020 in [facility] [place] [facility] Office Visit from 12/03/2019 in [facility] for [facility]  Total GAD-7 Score 7 5 2 1 7       PHQ2-9    Flowsheet Row Video Visit from 05/26/2021 in [facility] Office Visit from 04/12/2021 in [facility] [place] at [facility] Video Visit from 02/28/2021 in [facility] Office Visit from 09/09/2020 in [facility] Office Visit from 07/13/2020 in [facility] [place] [facility]  PHQ-2 Total Score 2 2 0 1 0  PHQ-9 Total Score 9 8 -- 4 2      Flowsheet Row Video Visit from 02/28/2021 in [place] No Risk        Assessment and Plan: Patient endorses anxiety and depression due to finances however notes that she is able to cope with it. She also notes that her ADHD continues to be bothersome. Patient referred to [facility] to begin the process of patient care assistance for Vyvanse. No medication changes made today. Patient may restart Vyvance at a lower dose 40 mg if her patient care assistance is approved for the medication  1. Attention deficit hyperactivity disorder (ADHD), predominantly inattentive type  Continue- amphetamine-dextroamphetamine (ADDERALL XR) 20 MG 24 hr capsule; Take 2 capsules in the morning  Dispense: 60 capsule; Refill: 0  Follow up in 3 months Salley Slaughter, NP 05/26/2021, 12:07 PM

## 2021-06-06 ENCOUNTER — Other Ambulatory Visit: Payer: Self-pay | Admitting: Family Medicine

## 2021-06-06 ENCOUNTER — Telehealth (HOSPITAL_COMMUNITY): Payer: Self-pay | Admitting: *Deleted

## 2021-06-06 ENCOUNTER — Other Ambulatory Visit (HOSPITAL_COMMUNITY): Payer: Self-pay | Admitting: Psychiatry

## 2021-06-06 DIAGNOSIS — E119 Type 2 diabetes mellitus without complications: Secondary | ICD-10-CM

## 2021-06-06 DIAGNOSIS — Z794 Long term (current) use of insulin: Secondary | ICD-10-CM

## 2021-06-06 MED ORDER — LISDEXAMFETAMINE DIMESYLATE 40 MG PO CAPS: 40.0000 mg | ORAL_CAPSULE | ORAL | 0 refills | Status: DC

## 2021-06-06 NOTE — Telephone Encounter (Addendum)
VM from patient stating she was calling with an update on her medicine and asking for provider to send rx to her preferred pharmacy for Vyvanse. Her last rx was for adderall but states she didn't fill it because Vyvanse works best for her but at the time she didn't have insurance for it but does now. She came by the office to email her insurance information to [name] which is [employer] Insurance off the market place. Will ask Dr Ronne Binning to call her rx to her pharmacy.

## 2021-06-06 NOTE — Telephone Encounter (Signed)
Adderall discontinued of Vyvanse sent to preferred pharmacy.

## 2021-06-12 ENCOUNTER — Encounter: Payer: Self-pay | Admitting: Family Medicine

## 2021-06-12 ENCOUNTER — Ambulatory Visit (INDEPENDENT_AMBULATORY_CARE_PROVIDER_SITE_OTHER): Payer: Self-pay | Admitting: Family Medicine

## 2021-06-12 ENCOUNTER — Other Ambulatory Visit: Payer: Self-pay

## 2021-06-12 VITALS — BP 122/68 | HR 84 | Wt 155.0 lb

## 2021-06-12 DIAGNOSIS — E785 Hyperlipidemia, unspecified: Secondary | ICD-10-CM

## 2021-06-12 DIAGNOSIS — I10 Essential (primary) hypertension: Secondary | ICD-10-CM

## 2021-06-12 DIAGNOSIS — E119 Type 2 diabetes mellitus without complications: Secondary | ICD-10-CM

## 2021-06-12 DIAGNOSIS — Z794 Long term (current) use of insulin: Secondary | ICD-10-CM

## 2021-06-12 LAB — POCT GLYCOSYLATED HEMOGLOBIN (HGB A1C): HbA1c, POC (prediabetic range): 6.3 % (ref 5.7–6.4)

## 2021-06-12 MED ORDER — ROSUVASTATIN CALCIUM 20 MG PO TABS: 20.0000 mg | ORAL_TABLET | Freq: Every day | ORAL | 1 refills | Status: DC

## 2021-06-12 NOTE — Progress Notes (Signed)
    SUBJECTIVE:   Hypertension Compliant on olmesartan-HCTZ as prescribed. Denies chest pain, dyspnea and leg swelling. Denies any concerns at this time.   Type 2 DM Compliant on sliding scale novlog 70/30 at home, Checks glucose levels daily at home, typically ranges between 87-120 with highest being 123 after she eats something sweet, depending on the food. Also taking weekly ozempic after prior discussion per Dr. Graylin Shiver recommendation. Denies any hypoglycemic episodes or neuropathy. Endorses having annual eye exams, last one was in May 2022.   Hyperlipidemia Previously prescribed crestor but denies taking it since she has not had refills. Denies myalgias or any concerns while she is on the medication. Endorses that she has been eating a balanced diet and dedicating time to exercising multiple times a week.   OBJECTIVE:   BP 122/68   Pulse 84   Wt 155 lb (70.3 kg)   BMI 28.35 kg/m   General: Patient well-appearing, in no acute distress. CV: RRR, no murmurs or gallops auscultated Resp: CTAB Abdomen: soft, nontender, nondistended, presence of bowel sounds Ext: normal foot exam with gross sensation intact, normal microfilament exam, no wounds noted, radial and distal pulses strong and equal bilaterally, no LE edema noted bilaterally Neuro: normal gait Psych: mood appropriate   ASSESSMENT/PLAN:   Essential hypertension, benign -BP 122/68, at goal today -continue current regimen   Type 2 diabetes mellitus without complication, with long-term current use of insulin (HCC) -A1c 6.3 today -continue current diabetic regimen -instructed to continue annual ophthalmology exams -foot exam normal   Hyperlipidemia -crestor refills provided  -lipid panel pending -diet and exercise counseling provided along with reassurance      Tracy Weiss, [place]

## 2021-06-12 NOTE — Assessment & Plan Note (Signed)
-  A1c 6.3 today -continue current diabetic regimen -instructed to continue annual ophthalmology exams -foot exam normal

## 2021-06-12 NOTE — Assessment & Plan Note (Signed)
-  BP 122/68, at goal today -continue current regimen

## 2021-06-12 NOTE — Patient Instructions (Signed)
It was great seeing you today!  Today we discussed many things. Your A1c is 6.3, keep up the good work! I have sent refills of crestor, please continue to take this medication daily. We will get blood work today and I will let you know of any abnormal results. Please continue to eat a balanced diet and stay active throughout the day.   Please follow up at your next scheduled appointment, if anything arises between now and then, please don't hesitate to contact our office.   Thank you for allowing [name] to be a part of your medical care!  Thank you, Dr. Larae Grooms

## 2021-06-12 NOTE — Assessment & Plan Note (Signed)
-  crestor refills provided  -lipid panel pending -diet and exercise counseling provided along with reassurance

## 2021-06-13 ENCOUNTER — Telehealth: Payer: Self-pay | Admitting: Pharmacist

## 2021-06-13 DIAGNOSIS — E119 Type 2 diabetes mellitus without complications: Secondary | ICD-10-CM

## 2021-06-13 DIAGNOSIS — Z794 Long term (current) use of insulin: Secondary | ICD-10-CM

## 2021-06-13 LAB — BASIC METABOLIC PANEL
BUN/Creatinine Ratio: 13 (ref 9–23)
BUN: 11 mg/dL (ref 6–24)
CO2: 24 mmol/L (ref 20–29)
Calcium: 9.5 mg/dL (ref 8.7–10.2)
Chloride: 100 mmol/L (ref 96–106)
Creatinine, Ser: 0.83 mg/dL (ref 0.57–1.00)
Glucose: 139 mg/dL — ABNORMAL HIGH (ref 70–99)
Potassium: 4.3 mmol/L (ref 3.5–5.2)
Sodium: 140 mmol/L (ref 134–144)
eGFR: 84 mL/min/{1.73_m2} (ref >59)

## 2021-06-13 LAB — LIPID PANEL
Chol/HDL Ratio: 2 ratio (ref 0.0–4.4)
Cholesterol, Total: 145 mg/dL (ref 100–199)
HDL: 71 mg/dL (ref >39)
LDL Chol Calc (NIH): 63 mg/dL (ref 0–99)
Triglycerides: 52 mg/dL (ref 0–149)
VLDL Cholesterol Cal: 11 mg/dL (ref 5–40)

## 2021-06-13 MED ORDER — OZEMPIC (2 MG/DOSE) 8 MG/3ML ~~LOC~~ SOPN: 2.0000 mg | PEN_INJECTOR | SUBCUTANEOUS | 3 refills | Status: DC

## 2021-06-13 NOTE — Telephone Encounter (Signed)
Patient called and requested call back 10/10.  I returned her call 10/11  Patient reports continued excellent blood sugar control.  She denies any symptomatic low blood glucose readings.  She reports using Ozempic from a friend but is currently out.  She did not want to use any alternative GLP when there was a back-order/shortage recently.   We discussed options and she is interested in trying the higher 2mg  Ozempic (semaglutide) dose weekly.  I agreed to send to her preferred pharmacy.  We reviewed plan to decrease her insulin  70/30 from 20 units per dose to 10 units per dose.  She was educated that if her control was good we may also consider taking her insulin away.    She is motivated to continue losing weight.  She reports her current weight is ~ 153# and she has a goal weight of ~ 140 in there 3-6 month plan.   New prescription for Ozempic sent to her pharmacy.

## 2021-06-14 NOTE — Telephone Encounter (Signed)
Noted and agree. 

## 2021-06-21 ENCOUNTER — Telehealth: Payer: Self-pay | Admitting: *Deleted

## 2021-06-21 MED ORDER — OZEMPIC (1 MG/DOSE) 2 MG/1.5ML ~~LOC~~ SOPN
1.0000 mg | PEN_INJECTOR | SUBCUTANEOUS | 1 refills | Status: DC
Start: 2021-06-21 — End: 2021-06-27

## 2021-06-21 NOTE — Telephone Encounter (Signed)
Pt wants dr Valentina Lucks to call her ASAP about her ozempic. Please advise. Valree Feild Kennon Holter, CMA

## 2021-06-21 NOTE — Telephone Encounter (Signed)
Patient called office and requested a dose reduction from previously prescribed Ozempic (semaglutide) 2mg  back to the 1mg  weekly dose.  She shared that her pharmacy has no supply of 2mg  Ozempic BUT has 1 box (1 month supply) of the lower dose 1mg .   I agreed to change back to lower, previous dose.    New prescription sent to her pharmacy.   Consider dose increase again in the near future to attempt withdrawal of insulin therapy with continued weight loss.

## 2021-06-22 NOTE — Telephone Encounter (Signed)
Noted and agree. 

## 2021-06-27 ENCOUNTER — Telehealth: Payer: Self-pay | Admitting: Pharmacist

## 2021-06-27 MED ORDER — MOUNJARO 5 MG/0.5ML ~~LOC~~ SOAJ: 5.0000 mg | SUBCUTANEOUS | 1 refills | Status: DC

## 2021-06-27 NOTE — Telephone Encounter (Signed)
Patient called and stated that she has been unable to find any Ozempic (semglutide) available at [facility].   She requested a switch to an alternative therapy.   She requested a switch from Ozempic (semaglutide) to [employer] (tirzepatide).  New prescription for 5mg  once weekly provided.   Reduced dose to lower dose to assess for side effects.   Likely need to increase dose at next visit if patient is experiencing improved glycemic control.

## 2021-06-27 NOTE — Addendum Note (Signed)
Addended by: Leavy Cella on: 06/27/2021 02:58 PM   Modules accepted: Orders

## 2021-06-27 NOTE — Telephone Encounter (Signed)
Patient calls nurse line regarding prescription for [facility]. Patient reports that she checked at [facility] on [address] and they do not have prescription. Per chart review, I am unable to find where medication was sent to.   Please advise.   Talbot Grumbling, RN

## 2021-07-05 ENCOUNTER — Telehealth (HOSPITAL_COMMUNITY): Payer: Self-pay | Admitting: *Deleted

## 2021-07-05 ENCOUNTER — Other Ambulatory Visit (HOSPITAL_COMMUNITY): Payer: Self-pay | Admitting: Psychiatry

## 2021-07-05 MED ORDER — LISDEXAMFETAMINE DIMESYLATE 40 MG PO CAPS: 40.0000 mg | ORAL_CAPSULE | ORAL | 0 refills | Status: DC

## 2021-07-05 NOTE — Telephone Encounter (Signed)
Medication refilled and sent to preferred pharmacy

## 2021-07-05 NOTE — Telephone Encounter (Signed)
VM left for writer stating she needed a new rx for her Vyvnase called in. She should be out. She has a future appt on 09/01/21. Will inform Dr Ronne Binning of her request.

## 2021-07-07 ENCOUNTER — Telehealth: Payer: Self-pay | Admitting: Pharmacist

## 2021-07-07 NOTE — Telephone Encounter (Signed)
Patient calls and reports she has received and has taken Mounjaro (tirzepatide) 5mg  once.   She reports that she believes it does not work as well as her semaglutide.   We discussed equivalent dosing, how I reduced dose in transition to new medication and discussed treatment dose adjustment for [facility] moving forward.   She agreed to a plan that included taking her next dose as prescribed. 5mg  on Sunday.  Then she will increase the following week to 10mg  by taking two of the 5mg  injections.   We plan to talk following several days on the 10mg   weekly dose.  At that time we can adjust/adapt treatment plan.    Patient verbalized understanding of treatment plan.

## 2021-07-10 ENCOUNTER — Other Ambulatory Visit (HOSPITAL_COMMUNITY): Payer: Self-pay | Admitting: Psychiatry

## 2021-07-10 ENCOUNTER — Telehealth (HOSPITAL_COMMUNITY): Payer: Self-pay | Admitting: Psychiatry

## 2021-07-10 MED ORDER — LISDEXAMFETAMINE DIMESYLATE 70 MG PO CAPS: 70.0000 mg | ORAL_CAPSULE | ORAL | 0 refills | Status: DC

## 2021-07-10 NOTE — Telephone Encounter (Signed)
Patient notes that she has been unable to focus on her lower dose of Vyvance. She notes that 70 mg was more effective for her in the past and is agreeable to increasing to 70 mg today. She notes that she is also concerned about communicating with provider as she has been unable to reach provider. Provider gave patient number to nurses line. No other concerns noted at this time.

## 2021-07-10 NOTE — Telephone Encounter (Signed)
Patient contacted the office regarding Vyvanse prescription. Patient requested to speak with provider about increasing dosage.

## 2021-07-18 ENCOUNTER — Telehealth: Payer: Self-pay | Admitting: Pharmacist

## 2021-07-18 MED ORDER — TIRZEPATIDE 10 MG/0.5ML ~~LOC~~ SOAJ: 10.0000 mg | SUBCUTANEOUS | 11 refills | Status: DC

## 2021-07-18 NOTE — Telephone Encounter (Signed)
Noted and agree. 

## 2021-07-18 NOTE — Telephone Encounter (Signed)
Patient calls and reports improved glucose control and satiety control with Mounjaro (tirzepatide) 10mg  once weekly.  She  requested refill of Mounjaro - new prescription requested.   Agreed with treatment plan adjustment request.  New prescription sent to pharmacy requested.   Total time with patient call and documentation of interaction: 12 minutes.  Patient to follow-up with PCP next.

## 2021-07-23 ENCOUNTER — Other Ambulatory Visit: Payer: Self-pay | Admitting: Family Medicine

## 2021-07-23 DIAGNOSIS — E119 Type 2 diabetes mellitus without complications: Secondary | ICD-10-CM

## 2021-08-07 LAB — HM DIABETES EYE EXAM

## 2021-08-12 ENCOUNTER — Other Ambulatory Visit: Payer: Self-pay | Admitting: Family Medicine

## 2021-08-12 DIAGNOSIS — E785 Hyperlipidemia, unspecified: Secondary | ICD-10-CM

## 2021-08-16 ENCOUNTER — Telehealth: Payer: Self-pay

## 2021-08-16 NOTE — Telephone Encounter (Signed)
PA approved through 08/16/22.  Called pharmacy with approval.   Talbot Grumbling, RN

## 2021-08-16 NOTE — Telephone Encounter (Signed)
Received fax from pharmacy, PA needed on Mounjaro 10mg / 0.5 mL.  Clinical questions submitted via Cover My Meds.  Waiting on response, could take up to 72 hours.  Cover My Meds info: Key: [id number]  Talbot Grumbling, RN

## 2021-08-18 ENCOUNTER — Telehealth: Payer: Self-pay | Admitting: Pharmacist

## 2021-08-18 MED ORDER — TIRZEPATIDE 10 MG/0.5ML ~~LOC~~ SOAJ: 10.0000 mg | SUBCUTANEOUS | Status: DC

## 2021-08-18 NOTE — Telephone Encounter (Signed)
Noted and agree. 

## 2021-08-18 NOTE — Telephone Encounter (Signed)
Patient called and stated she thought she needed to have a reduced dose of tirzepatide as she thought she was "osing too much weight".   She reported taking tirzepatide 10mg  once weekly.  Denied any intolerance to this agent.   States she has two pens of 10mg  remaining.  She plans to schedule follow-up with PCP in the next few weeks.   I did not agree to reduce dose at this time as I believe she needs a PCP visit.   We agreed to extend duration of time between the remaining 2 pens and reevaluate her weight and blood sugar goals.   She agreed to increase to 10 days between the next dose and if she continued to lose weight, extend the following dose to 14 days between the 10mg  doses.  She verbalized understanding of treatment plan.   Next visit with PCP.

## 2021-08-22 ENCOUNTER — Telehealth (HOSPITAL_COMMUNITY): Payer: Self-pay | Admitting: *Deleted

## 2021-08-22 NOTE — Telephone Encounter (Signed)
VM from patient requesting a new rx for her Vyvanse. She should be out and her next appt with Dr Ronne Binning is on 09/01/21. Will forward request to Dr Ronne Binning for a new rx.

## 2021-08-23 ENCOUNTER — Other Ambulatory Visit (HOSPITAL_COMMUNITY): Payer: Self-pay | Admitting: Psychiatry

## 2021-08-23 MED ORDER — LISDEXAMFETAMINE DIMESYLATE 70 MG PO CAPS: 70.0000 mg | ORAL_CAPSULE | ORAL | 0 refills | Status: DC

## 2021-08-23 NOTE — Telephone Encounter (Signed)
Medication refilled and sent to preferred pharmacy

## 2021-08-25 ENCOUNTER — Other Ambulatory Visit: Payer: Self-pay | Admitting: *Deleted

## 2021-08-25 ENCOUNTER — Other Ambulatory Visit: Payer: Self-pay | Admitting: Family Medicine

## 2021-08-25 DIAGNOSIS — Z794 Long term (current) use of insulin: Secondary | ICD-10-CM

## 2021-08-25 DIAGNOSIS — E119 Type 2 diabetes mellitus without complications: Secondary | ICD-10-CM

## 2021-08-25 MED ORDER — NOVOLOG 70/30 FLEXPEN RELION (70-30) 100 UNIT/ML ~~LOC~~ SUPN
10.0000 [IU] | PEN_INJECTOR | Freq: Two times a day (BID) | SUBCUTANEOUS | 12 refills | Status: DC
Start: 2021-08-25 — End: 2021-08-30

## 2021-08-30 ENCOUNTER — Other Ambulatory Visit: Payer: Self-pay | Admitting: Family Medicine

## 2021-08-30 DIAGNOSIS — Z794 Long term (current) use of insulin: Secondary | ICD-10-CM

## 2021-08-30 MED ORDER — INSULIN LISPRO PROT & LISPRO (75-25 MIX) 100 UNIT/ML KWIKPEN: 10.0000 [IU] | PEN_INJECTOR | Freq: Two times a day (BID) | SUBCUTANEOUS | 11 refills | Status: DC

## 2021-09-01 ENCOUNTER — Encounter (HOSPITAL_COMMUNITY): Payer: Self-pay | Admitting: Psychiatry

## 2021-09-01 ENCOUNTER — Telehealth (INDEPENDENT_AMBULATORY_CARE_PROVIDER_SITE_OTHER): Payer: No Payment, Other | Admitting: Psychiatry

## 2021-09-01 DIAGNOSIS — F900 Attention-deficit hyperactivity disorder, predominantly inattentive type: Secondary | ICD-10-CM | POA: Diagnosis not present

## 2021-09-01 MED ORDER — LISDEXAMFETAMINE DIMESYLATE 70 MG PO CAPS: 70.0000 mg | ORAL_CAPSULE | ORAL | 0 refills | Status: DC

## 2021-09-01 NOTE — Progress Notes (Signed)
[facility] MD/PA/NP OP Progress Note Virtual Visit via Telephone Note  I connected with Tracy Weiss on 09/01/21 at 10:00 AM EST by telephone and verified that I am speaking with the correct person using two identifiers.  Location: Patient: Work Provider: Clinic   I discussed the limitations, risks, security and privacy concerns of performing an evaluation and management service by telephone and the availability of in person appointments. I also discussed with the patient that there may be a patient responsible charge related to this service. The patient expressed understanding and agreed to proceed.   I provided 30 minutes of non-face-to-face time during this encounter.  09/01/2021 10:16 AM Tracy Weiss  MRN:  [id number]  Chief Complaint: "I had trouble getting my meds again"  HPI: 55 year old female seen today for follow up psychiatric evaluation. She has a psychiatric history of anxiety and ADHD. She is currently managed on Vyvanse 70 mg daily and notes that it is effective in managing her psychiatric conditions.   Today she was unable to login virtually so her assessment was done over the phone. During exam she was pleasant, cooperative, and engaged in conversation. She informed Probation officer that since her last visit she had difficulty filling her medications. Patient notes that she wants to get a 3 months supply of Vyvanse like she did in the past. Provider informed patient that Vyvanse has to be filled monthly and informed her that a 3 month quantity would not be given to her at one time. She then requested another Dr. Provider informed patient that she could be seen by a provider in [place] or [place], she however notes that she would continue to call the clinic monthly for refills.  Patient preoccupied during exam. She notes that she was working and driving. Provider did not conduct a GAD 7 or a PHQ 9 however assessments can be done at the next visit. She did note that he has minimal  anxiety and depression. She also notes that she is able to focus more with her increased Vyvanse. Today she denies SI/HI/VAH, mania, or paranoia. She notes that she sleeps 5-6 hours nightly and reports having a good appetite.   No medication changes made today. Patient agreeable to continue medications as prescribed. No other concerns noted at this time.  Visit Diagnosis:    ICD-10-CM   1. Attention deficit hyperactivity disorder (ADHD), predominantly inattentive type  F90.0 lisdexamfetamine (VYVANSE) 70 MG capsule       Past Psychiatric History: ADHD and anxiety  Past Medical History:  Past Medical History:  Diagnosis Date   Abnormal uterine bleeding (AUB)    ADHD    Allergy    Anxiety    Diabetes mellitus without complication ([place])    type 2   Gallstones 09/2016   GERD (gastroesophageal reflux disease)    after gallbladder removed   Hyperlipidemia    per pt "taking as preventative"    Past Surgical History:  Procedure Laterality Date   CESAREAN SECTION     CHOLECYSTECTOMY N/A 09/11/2016   Procedure: LAPAROSCOPIC CHOLECYSTECTOMY WITH INTRAOPERATIVE CHOLANGIOGRAM;  Surgeon: Donnie Mesa, MD;  Location: [facility];  Service: General;  Laterality: N/A;   DILATATION AND CURETTAGE/HYSTEROSCOPY WITH MINERVA N/A 02/04/2019   Procedure: DILATATION AND CURETTAGE/ABLATION WITH MINERVA;  Surgeon: Emily Filbert, MD;  Location: [facility];  Service: Gynecology;  Laterality: N/A;  MINERVA ABLATION   IR RADIOLOGIST EVAL & MGMT  04/07/2020   tubal ligaton reversal  2017  Family Psychiatric History:  Family hx of ADHD in brothers, daughters and grandson    Family History:  Family History  Problem Relation Age of Onset   Cancer Mother        Metastatic with Unknown Primary; Stomach was involved   Diabetes Father    Heart disease Father    Breast cancer Neg Hx    Colon cancer Neg Hx    Esophageal cancer Neg Hx    Rectal cancer Neg Hx    Stomach cancer Neg Hx     Social  History:  Social History   Socioeconomic History   Marital status: Single    Spouse name: Not on file   Number of children: Not on file   Years of education: Not on file   Highest education level: Master's degree (e.g., MA, MS, MEng, MEd, MSW, MBA)  Occupational History   Not on file  Tobacco Use   Smoking status: Never   Smokeless tobacco: Never  Vaping Use   Vaping Use: Never used  Substance and Sexual Activity   Alcohol use: Yes    Alcohol/week: 0.0 standard drinks    Comment: rarely   Drug use: No   Sexual activity: Not Currently    Birth control/protection: None  Other Topics Concern   Not on file  Social History Narrative   Not on file   Social Determinants of Health   Financial Resource Strain: Not on file  Food Insecurity: Not on file  Transportation Needs: Not on file  Physical Activity: Not on file  Stress: Not on file  Social Connections: Not on file    Allergies: No Known Allergies  Metabolic Disorder Labs: Lab Results  Component Value Date   HGBA1C 6.3 06/12/2021   Lab Results  Component Value Date   PROLACTIN 11.5 10/07/2018   PROLACTIN 10.3 06/11/2012   Lab Results  Component Value Date   CHOL 145 06/12/2021   TRIG 52 06/12/2021   HDL 71 06/12/2021   CHOLHDL 2.0 06/12/2021   VLDL 26 08/13/2016   LDLCALC 63 06/12/2021   LDLCALC 119 (H) 12/22/2019   Lab Results  Component Value Date   TSH 0.694 10/07/2018   TSH 1.080 09/23/2017    Therapeutic Level Labs: No results found for: LITHIUM No results found for: VALPROATE No components found for:  CBMZ  Current Medications: Current Outpatient Medications  Medication Sig Dispense Refill   hydrOXYzine (ATARAX/VISTARIL) 25 MG tablet Take 1 tablet (25 mg total) by mouth 3 (three) times daily as needed. (Patient not taking: No sig reported) 30 tablet 3   Insulin Lispro Prot & Lispro (HUMALOG MIX 75/25 KWIKPEN) (75-25) 100 UNIT/ML Kwikpen Inject 10 Units into the skin in the morning and at  bedtime. 15 mL 11   lisdexamfetamine (VYVANSE) 70 MG capsule Take 1 capsule (70 mg total) by mouth every morning. 30 capsule 0   metroNIDAZOLE (METROGEL) 0.75 % vaginal gel Place 1 Applicatorful vaginally at bedtime. 70 g 0   norethindrone (AYGESTIN) 5 MG tablet Take 1 tablet (5 mg total) by mouth daily. (Patient not taking: Reported on 06/12/2021) 90 tablet 2   olmesartan-hydrochlorothiazide (BENICAR HCT) 40-25 MG tablet TAKE ONE TABLET BY MOUTH DAILY 60 tablet 0   ondansetron (ZOFRAN ODT) 4 MG disintegrating tablet Take 1 tablet (4 mg total) by mouth every 8 (eight) hours as needed for nausea or vomiting. (Patient not taking: No sig reported) 20 tablet 0   Prenatal Vit-Fe Fumarate-FA (PRENATAL VITAMIN PO) Take 1 tablet by  mouth.      rosuvastatin (CRESTOR) 20 MG tablet TAKE ONE TABLET BY MOUTH DAILY 90 tablet 1   tirzepatide (MOUNJARO) 10 MG/0.5ML Pen Inject 10 mg into the skin every 14 (fourteen) days.     tranexamic acid (LYSTEDA) 650 MG TABS tablet Take 2 tablets (1,300 mg total) by mouth 3 (three) times daily. During cycle 30 tablet 3   traZODone (DESYREL) 50 MG tablet Take 1 tablet (50 mg total) by mouth at bedtime. 90 tablet 1   No current facility-administered medications for this visit.     Musculoskeletal: Strength & Muscle Tone:  Unable to assess due to telphone visit [place]:  Unable to assess due to telphone visit Patient leans: N/A  Psychiatric Specialty Exam: Review of Systems  There were no vitals taken for this visit.There is no height or weight on file to calculate BMI.  General Appearance:  Unable to assess due to telphone visit  Eye Contact:   Unable to assess due to telphone visit  Speech:  Clear and Coherent and Normal Rate  Volume:  Normal  Mood:  Euthymic and .  Affect:  Appropriate and Congruent  Thought Process:  Coherent, Goal Directed, and Linear  Orientation:  Full (Time, Place, and Person)  Thought Content: WDL and Logical   Suicidal Thoughts:   No  Homicidal Thoughts:  No  Memory:  Immediate;   Good Recent;   Good Remote;   Good  Judgement:  Good  Insight:  Good  Psychomotor Activity:  Normal  Concentration:  Concentration: Good and Attention Span: Good  Recall:  Good  Fund of Knowledge: Good  Language: Good  Akathisia:   Unable to assess due to telphone visit  Handed:  Right  AIMS (if indicated): not done  Assets:  Communication Skills Desire for [place] Support  ADL's:  Intact  Cognition: WNL  Sleep:  Good   Screenings: GAD-7    Flowsheet Row Video Visit from 05/26/2021 in [facility] Office Visit from 04/12/2021 in [facility] [place] [facility] Video Visit from 02/28/2021 in [facility] Office Visit from 07/13/2020 in [facility] [place] [facility] Office Visit from 12/03/2019 in [place] for [facility]  Total GAD-7 Score 7 5 2 1 7       PHQ2-9    [place] Office Visit from 06/12/2021 in [place] Video Visit from 05/26/2021 in [facility] Office Visit from 04/12/2021 in [facility] [place] [facility] Video Visit from 02/28/2021 in [facility] Office Visit from 09/09/2020 in [place]  PHQ-2 Total Score 4 2 2  0 1  PHQ-9 Total Score 12 9 8  -- 4      Flowsheet Row Video Visit from 02/28/2021 in [place] No Risk        Assessment and Plan: Patient notes that she is dissatisfied with not getting a 3 month quantity of Vyvanse. Provider informed patient that she is only willing to fill monthly. Patient notes that she wanted another provider. When provider recommended [facility] Ser or [place] providers patient notes that she prefers to continue care with  Probation officer for now and notes that she would call monthly. No medication changes made today. Patient agreeable to continue medications as prescribed.   1. Attention deficit  hyperactivity disorder (ADHD), predominantly inattentive type  Continue- lisdexamfetamine (VYVANSE) 70 MG capsule; Take 1 capsule (70 mg total) by mouth every morning.  Dispense: 30 capsule; Refill: 0    Follow up in 3 months Salley Slaughter, NP 09/01/2021, 10:16 AM

## 2021-09-20 ENCOUNTER — Other Ambulatory Visit: Payer: Self-pay | Admitting: Family Medicine

## 2021-09-20 DIAGNOSIS — Z1231 Encounter for screening mammogram for malignant neoplasm of breast: Secondary | ICD-10-CM

## 2021-10-04 NOTE — Progress Notes (Signed)
° °  I, Peterson Lombard, LAT, ATC acting as a scribe for [name], MD.  Domenick Bookbinder Fleeger is a 56 y.o. female who presents to [place] at [facility] today for f/u of post-concussion syndrome after suffering a concussion on 06/27/20 when she was struck in the head by a swing set that she was trying to disassemble.  She was last seen by Dr. [name] on 12/01/20 and con't to suffer from short-term memory problems and visual disturbance.  She has been referred to both neurology and neuro ophthalmology.  Today, pt reports cont brain fog, memory issues, loosing her train of thought. Pt notes she is taking ADHD medications, Vyvanse. Pt works as a Education officer, museum, and is a new job, managing 5 different projects which she is feeling overwhelmed at times and "jittery."  Diagnostic imaging: Brain MRI- 10/10/20; CT head w/o contrast- 06/27/20  Pertinent review of systems: No fevers or chills  Relevant historical information: Hypertension   Exam:  BP 122/84    Pulse 85    Ht 5\' 2"  (1.575 m)    Wt 138 lb 12.8 oz (63 kg)    SpO2 100%    BMI 25.39 kg/m  General: Well Developed, well nourished, and in no acute distress.   Neuro: Alert and oriented.  Normal coordination and gait.  Speech and thought process is intact: She is somewhat tangential at times and expresses difficulty with executive processing organization. Psych: Expresses anxiety and depressive symptoms.     Assessment and Plan: 56 y.o. female with postconcussion syndrome complicated by ADHD and anxiety. She has improved over the last year but certainly is not better.  She is expressing difficulty at work doing complicated tasks that require extensive organization working as a Education officer, museum for [employer] I think she has good judgment and awareness of her difficulties.  She has a pretty good adaptive strategy that is working fairly well but not well enough. Additionally her ADHD is reasonably well managed with 70 mg of Vyvanse during the  weekday.  I think at this point the main next step should be a neuropsychological evaluation. Additionally we discussed some work accommodations.  I am not sure exactly what specific accommodations she needs but some basic simple recommendations are I think in order.  Happy to fill out FMLA and ADA paperwork.  I wrote a letter today.   Discussed warning signs or symptoms. Please see discharge instructions. Patient expresses understanding.   The above documentation has been reviewed and is accurate and complete [name], M.D.  Total encounter time 30 minutes including face-to-face time with the patient and, reviewing past medical record, and charting on the date of service.   Reviewed recent history and discussed treatment plan and options as well as communicated with her neurology team.

## 2021-10-06 ENCOUNTER — Other Ambulatory Visit: Payer: Self-pay

## 2021-10-06 ENCOUNTER — Ambulatory Visit (INDEPENDENT_AMBULATORY_CARE_PROVIDER_SITE_OTHER): Payer: 59 | Admitting: Family Medicine

## 2021-10-06 VITALS — BP 122/84 | HR 85 | Ht 62.0 in | Wt 138.8 lb

## 2021-10-06 DIAGNOSIS — F900 Attention-deficit hyperactivity disorder, predominantly inattentive type: Secondary | ICD-10-CM | POA: Diagnosis not present

## 2021-10-06 DIAGNOSIS — F0781 Postconcussional syndrome: Secondary | ICD-10-CM | POA: Diagnosis not present

## 2021-10-06 DIAGNOSIS — F411 Generalized anxiety disorder: Secondary | ICD-10-CM | POA: Diagnosis not present

## 2021-10-06 NOTE — Patient Instructions (Addendum)
Thank you for coming in today.   I will work on a neuropsychology eval.   Let me know.

## 2021-10-11 ENCOUNTER — Encounter: Payer: Self-pay | Admitting: Psychology

## 2021-10-11 ENCOUNTER — Telehealth: Payer: Self-pay

## 2021-10-11 DIAGNOSIS — F0781 Postconcussional syndrome: Secondary | ICD-10-CM

## 2021-10-11 NOTE — Telephone Encounter (Signed)
-----   Message from Alda Berthold, DO sent at 10/11/2021 12:42 PM EST ----- Regarding: FW: Neuropsychological evaluation Kenyon Eshleman, please order neuropsych testing on this patient  - route to Dr. [name] to sign the order. Danna, please contact pt to schedule neuropsych testing (not urgent)  Thanks, DP   ----- Message ----- From: Gregor Hams, MD Sent: 10/11/2021   7:05 AM EST To: Alda Berthold, DO Subject: RE: Neuropsychological evaluation              Yes please.   Thank you. I understand its a real struggle to get neuropsychological testing currently.  I do not send a lot of people for the testing but for people who just cannot get better it is helpful.  I would like to schedule this patient if possible as it is helpful especially for occupational and disability purposes in the future.  Please and thank you  Ellard Artis ----- Message ----- From: Alda Berthold, DO Sent: 10/10/2021   3:09 PM EST To: Gregor Hams, MD Subject: RE: Neuropsychological evaluation              Hi Ellard Artis,  Thanks for reaching out.  I suspect this is ongoing symptoms related to post-concussive symptoms. Our neuropsychology care focuses on dementia evaluation and testing only, so that is probably why you're not able to get your younger patients scheduled here. We do not have counseling resources involved in caring for for cognitive impairment due to concussion/mood/sleep disorders,etc.   I did speak with our neuropsychologist about this patient who is happy to see her for neuropsychology testing only. The wait for testing, however, is really long - we currently only have 1 neuropsychologist and he is booking out into the fall of 2023 with appointments.  If you are ok to proceed, I will notify the schedulers to contact pt.  Let me know.  Thanks,  Donika    ----- Message ----- From: Gregor Hams, MD Sent: 10/06/2021   9:56 AM EST To: Alda Berthold, DO Subject: Neuropsychological evaluation                   You saw this patient last March and I saw her again just recently.  She still is expressing difficulty with thought process and organization and executive functioning.  I think she would benefit from a neuropsychological evaluation.  Recently I have had significant difficulty getting people in for a neuropsychological evaluation.  Do think if you placed the referral to [facility] neuropsychology it would go better?  Ellard Artis

## 2021-10-13 ENCOUNTER — Encounter: Payer: Self-pay | Admitting: Family Medicine

## 2021-10-18 ENCOUNTER — Telehealth (HOSPITAL_COMMUNITY): Payer: Self-pay | Admitting: Psychiatry

## 2021-10-18 ENCOUNTER — Telehealth: Payer: Self-pay | Admitting: Pharmacist

## 2021-10-18 NOTE — Telephone Encounter (Signed)
Noted and agree. 

## 2021-10-18 NOTE — Telephone Encounter (Signed)
Provider attempted to call patient without success.  Provider did leave a voicemail informing patient that Vyvanse can reduce appetite and cause potential weight loss.  Provider informed patient that if she had further questions or concerns she could call the clinic or walk-in during walk-in hours on Mondays and Tuesdays between the hours of 8 AM and 11 AM.

## 2021-10-18 NOTE — Telephone Encounter (Signed)
Patient called to the office and expressed concern related to excess weight loss.  She expressed concern that she has skin that is "not filled out" any longer.   Her weight most recently is reported at 137lbs.  This weight is similar to her weight at last office visit with Dr. [name].   She is concerned about her weight although she expresses that her current weight is good for her health.  She does not like the way her skin looks.   She is taking 8-10 units of Novolin70/30 insulin currently in addition to Mounjaro (tirzepatide) 10mg  once weekly. She took her last dose on Sunday.   She also expressed concern that her Vyvanse (stimulant) was leading to weight loss.   Following discussion, we agreed to stop her [facility] and agreed that a period of time without Mounjaro (or any other GLP) may be appropriate.  She would like to "get off" insulin but I reinforced with her that we should only change one thing at a time.   She will continue insulin and Vyvanse until she sees Dr. Larae Grooms in 5 days.  Appointment 10/23/2021 had already been scheduled.   I suggest reevaluation of A1C and consideration of a trial OFF of any GLP related therapy as next step.  I would encourage continuation of insulin at this time.  Perhaps a trial off of insulin could be considered in the future.

## 2021-10-18 NOTE — Telephone Encounter (Signed)
Pt called to report she has lost a significant amount of weight.  She is contacting her PCP as well. Pt wants to discuss with Ronne Binning her medications, specifically Vyvanse 70 mg. Please call pt [phone].

## 2021-10-21 ENCOUNTER — Emergency Department (HOSPITAL_BASED_OUTPATIENT_CLINIC_OR_DEPARTMENT_OTHER)
Admission: EM | Admit: 2021-10-21 | Discharge: 2021-10-21 | Disposition: A | Discharge status: Home / Self Care | Payer: 59 | Attending: Emergency Medicine | Admitting: Emergency Medicine

## 2021-10-21 ENCOUNTER — Other Ambulatory Visit: Payer: Self-pay

## 2021-10-21 ENCOUNTER — Encounter (HOSPITAL_BASED_OUTPATIENT_CLINIC_OR_DEPARTMENT_OTHER): Payer: Self-pay | Admitting: Emergency Medicine

## 2021-10-21 ENCOUNTER — Emergency Department (HOSPITAL_BASED_OUTPATIENT_CLINIC_OR_DEPARTMENT_OTHER): Payer: 59

## 2021-10-21 DIAGNOSIS — R1011 Right upper quadrant pain: Secondary | ICD-10-CM | POA: Insufficient documentation

## 2021-10-21 DIAGNOSIS — R110 Nausea: Secondary | ICD-10-CM | POA: Insufficient documentation

## 2021-10-21 DIAGNOSIS — R1013 Epigastric pain: Secondary | ICD-10-CM

## 2021-10-21 HISTORY — DX: Irritable bowel syndrome, unspecified: K58.9

## 2021-10-21 LAB — CBC WITH DIFFERENTIAL/PLATELET
Abs Immature Granulocytes: 0.01 10*3/uL (ref 0.00–0.07)
Basophils Absolute: 0 10*3/uL (ref 0.0–0.1)
Basophils Relative: 1 %
Eosinophils Absolute: 0.1 10*3/uL (ref 0.0–0.5)
Eosinophils Relative: 3 %
HCT: 36.7 % (ref 36.0–46.0)
Hemoglobin: 11.8 g/dL — ABNORMAL LOW (ref 12.0–15.0)
Immature Granulocytes: 0 %
Lymphocytes Relative: 41 %
Lymphs Abs: 1.6 10*3/uL (ref 0.7–4.0)
MCH: 29 pg (ref 26.0–34.0)
MCHC: 32.2 g/dL (ref 30.0–36.0)
MCV: 90.2 fL (ref 80.0–100.0)
Monocytes Absolute: 0.2 10*3/uL (ref 0.1–1.0)
Monocytes Relative: 5 %
Neutro Abs: 2 10*3/uL (ref 1.7–7.7)
Neutrophils Relative %: 50 %
Platelets: 302 10*3/uL (ref 150–400)
RBC: 4.07 MIL/uL (ref 3.87–5.11)
RDW: 12.5 % (ref 11.5–15.5)
WBC: 4 10*3/uL (ref 4.0–10.5)
nRBC: 0 % (ref 0.0–0.2)

## 2021-10-21 LAB — URINALYSIS, ROUTINE W REFLEX MICROSCOPIC
Bilirubin Urine: NEGATIVE
Glucose, UA: NEGATIVE mg/dL
Hgb urine dipstick: NEGATIVE
Ketones, ur: NEGATIVE mg/dL
Leukocytes,Ua: NEGATIVE
Nitrite: NEGATIVE
Protein, ur: NEGATIVE mg/dL
Specific Gravity, Urine: >=1.03 (ref 1.005–1.030)
pH: 5.5 (ref 5.0–8.0)

## 2021-10-21 LAB — COMPREHENSIVE METABOLIC PANEL
ALT: 21 U/L (ref 0–44)
AST: 20 U/L (ref 15–41)
Albumin: 3.7 g/dL (ref 3.5–5.0)
Alkaline Phosphatase: 39 U/L (ref 38–126)
Anion gap: 4 — ABNORMAL LOW (ref 5–15)
BUN: 12 mg/dL (ref 6–20)
CO2: 32 mmol/L (ref 22–32)
Calcium: 9.1 mg/dL (ref 8.9–10.3)
Chloride: 103 mmol/L (ref 98–111)
Creatinine, Ser: 0.73 mg/dL (ref 0.44–1.00)
GFR, Estimated: >60 mL/min (ref >60)
Glucose, Bld: 160 mg/dL — ABNORMAL HIGH (ref 70–99)
Potassium: 4.2 mmol/L (ref 3.5–5.1)
Sodium: 139 mmol/L (ref 135–145)
Total Bilirubin: 0.1 mg/dL — ABNORMAL LOW (ref 0.3–1.2)
Total Protein: 6.9 g/dL (ref 6.5–8.1)

## 2021-10-21 LAB — LIPASE, BLOOD: Lipase: 91 U/L — ABNORMAL HIGH (ref 11–51)

## 2021-10-21 LAB — TROPONIN I (HIGH SENSITIVITY): Troponin I (High Sensitivity): <2 ng/L (ref <18)

## 2021-10-21 LAB — PREGNANCY, URINE: Preg Test, Ur: NEGATIVE

## 2021-10-21 MED ORDER — DICYCLOMINE HCL 10 MG PO CAPS
10.0000 mg | ORAL_CAPSULE | Freq: Once | ORAL | Status: AC
  Administered 2021-10-21: 10 mg via ORAL

## 2021-10-21 MED ORDER — DICYCLOMINE HCL 20 MG PO TABS
10.0000 mg | ORAL_TABLET | Freq: Once | ORAL | Status: DC
  Filled 2021-10-21: qty 1

## 2021-10-21 MED ORDER — DICYCLOMINE HCL 10 MG PO CAPS: 20.0000 mg | ORAL_CAPSULE | Freq: Once | ORAL | Status: DC

## 2021-10-21 MED ORDER — ONDANSETRON HCL 4 MG/2ML IJ SOLN
4.0000 mg | Freq: Once | INTRAMUSCULAR | Status: AC
  Administered 2021-10-21: 4 mg via INTRAVENOUS
  Filled 2021-10-21: qty 2

## 2021-10-21 MED ORDER — HYDROXYZINE HCL 25 MG PO TABS: 25.0000 mg | ORAL_TABLET | Freq: Every evening | ORAL | 0 refills | Status: AC | PRN

## 2021-10-21 MED ORDER — PANTOPRAZOLE SODIUM 20 MG PO TBEC: 20.0000 mg | DELAYED_RELEASE_TABLET | Freq: Every day | ORAL | 0 refills | Status: DC

## 2021-10-21 MED ORDER — LIDOCAINE VISCOUS HCL 2 % MT SOLN
15.0000 mL | Freq: Once | OROMUCOSAL | Status: AC
  Administered 2021-10-21: 15 mL via ORAL
  Filled 2021-10-21: qty 15

## 2021-10-21 MED ORDER — ALUM & MAG HYDROXIDE-SIMETH 200-200-20 MG/5ML PO SUSP
30.0000 mL | Freq: Once | ORAL | Status: AC
  Administered 2021-10-21: 30 mL via ORAL
  Filled 2021-10-21: qty 30

## 2021-10-21 MED ORDER — IOHEXOL 300 MG/ML  SOLN
80.0000 mL | Freq: Once | INTRAMUSCULAR | Status: AC | PRN
  Administered 2021-10-21: 80 mL via INTRAVENOUS

## 2021-10-21 MED ORDER — DICYCLOMINE HCL 10 MG/5ML PO SOLN: 10.0000 mg | Freq: Once | ORAL | Status: DC

## 2021-10-21 NOTE — ED Provider Notes (Signed)
[facility] EMERGENCY DEPARTMENT Provider Note   CSN: [id number] Arrival date & time: 10/21/21  0857     History  Chief Complaint  Patient presents with   Abdominal Pain    Tracy Weiss is a 56 y.o. female with type 2 diabetes who presents to the ED for evaluation of epigastric pain that has been intermittent for the last few months has become constant in the last few days.  Patient is describing her pain as aching.  Not affected by positional changes.  Pain is worsened after eating.  She has tried Tylenol Motrin without significant relief.  Patient endorses nausea without vomiting.  She denies constipation diarrhea.  No previous history of symptoms.  She states pain feels similar to when she had her emergency gallbladder removal.  She insist pain does not feel like burning or like acid reflux.  She denies chest pain, urinary symptoms.    Abdominal Pain     Home Medications Prior to Admission medications   Medication Sig Start Date End Date Taking? Authorizing Provider  hydrOXYzine (ATARAX) 25 MG tablet Take 1 tablet (25 mg total) by mouth at bedtime as needed for up to 7 days for anxiety. 10/21/21 10/28/21 Yes Kathe Becton R, PA-C  pantoprazole (PROTONIX) 20 MG tablet Take 1 tablet (20 mg total) by mouth daily. 10/21/21  Yes Tonye Pearson, PA-C  Insulin Lispro Prot & Lispro (HUMALOG MIX 75/25 KWIKPEN) (75-25) 100 UNIT/ML Kwikpen Inject 10 Units into the skin in the morning and at bedtime. 08/30/21   Ganta, Anupa, DO  latanoprost (XALATAN) 0.005 % ophthalmic solution SMARTSIG:In Eye(s) 10/15/21   [provider]  lisdexamfetamine (VYVANSE) 70 MG capsule Take 1 capsule (70 mg total) by mouth every morning. 09/01/21   Salley Slaughter, NP  metroNIDAZOLE (METROGEL) 0.75 % vaginal gel Place 1 Applicatorful vaginally at bedtime. 09/09/20   Daisy Floro, DO  olmesartan-hydrochlorothiazide (BENICAR HCT) 40-25 MG tablet TAKE ONE TABLET BY MOUTH DAILY 05/15/21    Donney Dice, DO  Prenatal Vit-Fe Fumarate-FA (PRENATAL VITAMIN PO) Take 1 tablet by mouth.     [provider]  RESTASIS 0.05 % ophthalmic emulsion 1 drop 2 (two) times daily. 09/06/21   [provider]  rosuvastatin (CRESTOR) 20 MG tablet TAKE ONE TABLET BY MOUTH DAILY 08/14/21   Ganta, Anupa, DO  tirzepatide (MOUNJARO) 10 MG/0.5ML Pen Inject 10 mg into the skin every 14 (fourteen) days. 08/18/21   Leavy Cella, RPH-CPP  tranexamic acid (LYSTEDA) 650 MG TABS tablet Take 2 tablets (1,300 mg total) by mouth 3 (three) times daily. During cycle 10/10/20   Donnamae Jude, MD  traZODone (DESYREL) 50 MG tablet Take 1 tablet (50 mg total) by mouth at bedtime. 11/01/20   Gregor Hams, MD  VYZULTA 0.024 % SOLN Apply 1 drop to eye daily. 09/06/21   [provider]      Allergies    Patient has no known allergies.    Review of Systems   Review of Systems  Gastrointestinal:  Positive for abdominal pain.   Physical Exam Updated Vital Signs BP 120/79    Pulse 74    Temp 98.5 F (36.9 C) (Oral)    Resp 16    Ht 5\' 2"  (1.575 m)    Wt 60.3 kg    LMP 09/12/2021    SpO2 100%    BMI 24.33 kg/m  Physical Exam Vitals and nursing note reviewed.  Constitutional:      General: She  is not in acute distress.    Appearance: She is not ill-appearing.  HENT:     Head: Atraumatic.  Eyes:     Conjunctiva/sclera: Conjunctivae normal.  Cardiovascular:     Rate and Rhythm: Normal rate and regular rhythm.     Pulses: Normal pulses.     Heart sounds: No murmur heard. Pulmonary:     Effort: Pulmonary effort is normal. No respiratory distress.     Breath sounds: Normal breath sounds.  Abdominal:     General: Abdomen is flat. There is no distension.     Palpations: Abdomen is soft.     Tenderness: There is abdominal tenderness in the right upper quadrant and epigastric area. There is no right CVA tenderness or left CVA tenderness. Negative signs include Murphy's sign and McBurney's sign.   Musculoskeletal:        General: Normal range of motion.     Cervical back: Normal range of motion.  Skin:    General: Skin is warm and dry.     Capillary Refill: Capillary refill takes less than 2 seconds.  Neurological:     General: No focal deficit present.     Mental Status: She is alert.  Psychiatric:        Mood and Affect: Mood normal.    ED Results / Procedures / Treatments   Labs (all labs ordered are listed, but only abnormal results are displayed) Labs Reviewed  COMPREHENSIVE METABOLIC PANEL - Abnormal; Notable for the following components:      Result Value   Glucose, Bld 160 (*)    Total Bilirubin 0.1 (*)    Anion gap 4 (*)    All other components within normal limits  LIPASE, BLOOD - Abnormal; Notable for the following components:   Lipase 91 (*)    All other components within normal limits  CBC WITH DIFFERENTIAL/PLATELET - Abnormal; Notable for the following components:   Hemoglobin 11.8 (*)    All other components within normal limits  URINALYSIS, ROUTINE W REFLEX MICROSCOPIC  PREGNANCY, URINE  TROPONIN I (HIGH SENSITIVITY)    EKG EKG Interpretation  Date/Time:  Saturday October 21 2021 09:17:43 EST Ventricular Rate:  69 PR Interval:  158 QRS Duration: 88 QT Interval:  413 QTC Calculation: 443 R Axis:   26 Text Interpretation: Sinus rhythm Normal ECG No significant change since last tracing Confirmed by Calvert Cantor [phone]) on 10/21/2021 9:49:55 AM  Radiology CT ABDOMEN PELVIS W CONTRAST  Result Date: 10/21/2021 CLINICAL DATA:  Intermittent epigastric pain. EXAM: CT ABDOMEN AND PELVIS WITH CONTRAST TECHNIQUE: Multidetector CT imaging of the abdomen and pelvis was performed using the standard protocol following bolus administration of intravenous contrast. RADIATION DOSE REDUCTION: This exam was performed according to the departmental dose-optimization program which includes automated exposure control, adjustment of the mA and/or kV according to  patient size and/or use of iterative reconstruction technique. CONTRAST:  43mL OMNIPAQUE IOHEXOL 300 MG/ML  SOLN COMPARISON:  03/31/2020 FINDINGS: Lower chest: Unremarkable. Hepatobiliary: No suspicious focal abnormality within the liver parenchyma. Gallbladder is surgically absent. No intrahepatic or extrahepatic biliary dilation. Pancreas: No focal mass lesion. No dilatation of the main duct. No intraparenchymal cyst. No peripancreatic edema. Spleen: No splenomegaly. No focal mass lesion. Adrenals/Urinary Tract: No adrenal nodule or mass. Kidneys unremarkable. No evidence for hydroureter. The urinary bladder appears normal for the degree of distention. Stomach/Bowel: Stomach is unremarkable. No gastric wall thickening. No evidence of outlet obstruction. Duodenum is normally positioned as is the ligament of  Treitz. No small bowel wall thickening. No small bowel dilatation. The terminal ileum is normal. The appendix is normal. No gross colonic mass. No colonic wall thickening. Vascular/Lymphatic: There is mild atherosclerotic calcification of the abdominal aorta without aneurysm. There is no gastrohepatic or hepatoduodenal ligament lymphadenopathy. No retroperitoneal or mesenteric lymphadenopathy. No pelvic sidewall lymphadenopathy. Reproductive: 2.8 cm in cystic lesion in the left adnexal space is stable in the nearly 2 year interval since the prior CT study suggesting benign etiology. This was also documented on pelvic ultrasound of 10/02/2018 when it was measured at 2.4 cm and described as an "simple cyst" in the left ovary. Other: No intraperitoneal free fluid. Musculoskeletal: No worrisome lytic or sclerotic osseous abnormality. IMPRESSION: 1. No acute findings in the abdomen or pelvis. Specifically, no findings to explain the patient's history of epigastric pain. 2. 2.8 cm cystic lesion in the left adnexal space is stable in the nearly 2 year interval since the prior CT and was also documented as a 2.4 cm  simple cyst on pelvic ultrasound of 10/02/2018, consistent with benign etiology. No follow-up imaging recommended. Note: This recommendation does not apply to premenarchal patients and to those with increased risk (genetic, family history, elevated tumor markers or other high-risk factors) of ovarian cancer. Reference: JACR 2020 Feb; 17(2):248-254 3. Aortic Atherosclerosis (ICD10-I70.0). Electronically Signed   By: Misty Stanley M.D.   On: 10/21/2021 11:06    Procedures Procedures    Medications Ordered in ED Medications  ondansetron (ZOFRAN) injection 4 mg (4 mg Intravenous Given 10/21/21 1035)  alum & mag hydroxide-simeth (MAALOX/MYLANTA) 200-200-20 MG/5ML suspension 30 mL (30 mLs Oral Given 10/21/21 1035)    And  lidocaine (XYLOCAINE) 2 % viscous mouth solution 15 mL (15 mLs Oral Given 10/21/21 1035)  dicyclomine (BENTYL) capsule 10 mg (10 mg Oral Given 10/21/21 1032)  iohexol (OMNIPAQUE) 300 MG/ML solution 80 mL (80 mLs Intravenous Contrast Given 10/21/21 1044)    ED Course/ Medical Decision Making/ A&P                           Medical Decision Making Amount and/or Complexity of Data Reviewed Labs: ordered. Radiology: ordered.  Risk OTC drugs. Prescription drug management.   History:  Per HPI  Initial impression:  This patient presents to the ED for concern of abdominal pain, this involves an extensive number of treatment options, and is a complaint that carries with it a high risk of complications and morbidity.   The differential diagnosis for generalized abdominal pain includes, but is not limited to AAA, gastroenteritis, appendicitis, Bowel obstruction, Bowel perforation. Gastroparesis, DKA, Hernia, Inflammatory bowel disease, mesenteric ischemia, pancreatitis, peritonitis SBP, volvulus.  ED Course: 56 year old ill-appearing although nontoxic female resting comfortably in bed.  Vitals are stable.  She has mild tenderness to palpation in the epigastric and RUQ.  Status  postcholecystectomy.  Labs available at triage include normal urinalysis, negative pregnancy test.  EKG with NSR, CBC without leukocytosis.  Lipase mildly elevated at 91.  Will obtain CT abdomen pelvis to rule out pancreatitis.  Troponin normal. CT abdomen pelvis without acute findings.  I Ordered, reviewed, and interpreted labs and EKG.    I independently visualized and interpreted imaging and I agree with the radiologist interpretation.    Cardiac Monitoring:  The patient was maintained on a cardiac monitor.  I personally viewed and interpreted the cardiac monitored which showed an underlying rhythm of: NSR   Medicines ordered and prescription drug management:  I ordered medication including: GI cocktail Zofran Reevaluation of the patient after these medicines showed that the patient improved I have reviewed the patients home medicines and have made adjustments as needed   Disposition:  After consideration of the diagnostic results, physical exam, history and the patients response to treatment feel that the patent would benefit from discharge with outpatient follow-up.   Acute epigastric pain: No acute findings on work-up today.  Patient's vitals are stable and is nontoxic-appearing.  Low concern for pancreatitis given normal CT scan and only mild elevation.  Will trial short course of Protonix while patient sets up appointment with GI referral.  Return precautions were discussed.  Patient also requests to try something to help her sleep at night as she has severe anxiety.  Have given her a few tablets of Atarax to see if this improves her sleep.  She did follow-up outpatient with her PCP if she requires more.  Patient is understanding and amenable to plan.  Discharged home in good condition.   Final Clinical Impression(s) / ED Diagnoses Final diagnoses:  Acute epigastric pain    Rx / DC Orders ED Discharge Orders          Ordered    pantoprazole (PROTONIX) 20 MG tablet  Daily         10/21/21 1136    hydrOXYzine (ATARAX) 25 MG tablet  At bedtime PRN        10/21/21 1136              Tonye Pearson, [place] 10/22/21 0949    Truddie Hidden, MD 10/22/21 1513

## 2021-10-21 NOTE — Discharge Instructions (Addendum)
Your work-up today was overall reassuring.  Your CT was negative for infection or causes of your pain.  Your blood work looks pretty good.  Your lipase, and enzyme related to your pancreas was mildly elevated, however you were negative for pancreatitis.  This could be elevated for other reasons as well including not eating or recurrent vomiting.  I have given you a GI referral should you need further imaging for evaluation.  I also sent you in a prescription for Protonix we will take once daily until you can follow-up to see if this helps with your pain.  Since you are having trouble sleeping, I have also sent you in a prescription for Atarax which she can take at night before bed.  I will send you in a week supply, so if you like this medication, you can discuss with your PCP.

## 2021-10-21 NOTE — ED Triage Notes (Signed)
Epigastric pain, intermittent for a few months.  Pain has increased in the last week, more constant.  Pt states it reminds her of when she had her gallbladder removed.  Pt states her appetite has decreased.  Pt admits to nausea frequently, vomiting x 2 in the last week.  No diarrhea, last BM 2 days ago.

## 2021-10-23 ENCOUNTER — Other Ambulatory Visit: Payer: Self-pay

## 2021-10-23 ENCOUNTER — Encounter: Payer: Self-pay | Admitting: Family Medicine

## 2021-10-23 ENCOUNTER — Ambulatory Visit (INDEPENDENT_AMBULATORY_CARE_PROVIDER_SITE_OTHER): Payer: 59 | Admitting: Family Medicine

## 2021-10-23 VITALS — BP 110/72 | HR 107 | Ht 62.0 in | Wt 136.2 lb

## 2021-10-23 DIAGNOSIS — K219 Gastro-esophageal reflux disease without esophagitis: Secondary | ICD-10-CM | POA: Diagnosis not present

## 2021-10-23 DIAGNOSIS — K5900 Constipation, unspecified: Secondary | ICD-10-CM

## 2021-10-23 MED ORDER — PANTOPRAZOLE SODIUM 20 MG PO TBEC: 20.0000 mg | DELAYED_RELEASE_TABLET | Freq: Every day | ORAL | 0 refills | Status: DC

## 2021-10-23 MED ORDER — SENNA 8.6 MG PO TABS: 1.0000 | ORAL_TABLET | Freq: Every day | ORAL | 0 refills | Status: DC

## 2021-10-23 MED ORDER — POLYETHYLENE GLYCOL 3350 17 GM/SCOOP PO POWD: 17.0000 g | Freq: Every day | ORAL | 0 refills | Status: DC

## 2021-10-23 NOTE — Assessment & Plan Note (Signed)
-  given diffuse pain, symptoms may also be due to constipation -miralax and senna daily, adjust as appropriate for goal of 1 BM every 1-2 days

## 2021-10-23 NOTE — Progress Notes (Signed)
° ° °  SUBJECTIVE:   CHIEF COMPLAINT / HPI:   Patient here for follow up after recent ED visit regarding abdominal pain. Patient s/p gallbaldder removal about 2 years ago. She presents with abdominal pain for about a month. She points to the epigastric region. Describes the pain as an aching pain, nonradiating pain. No particular pattern with when the pain starts. Endorses intermittent halitosis. Denies globulus sensation. Pain worsening over the past month. Cannot identify any aggravating or relieving factors although sleeping makes it better at times. Feels like a burning sensation. Imaging unremarkable without any acute abdominal or pelvic findings. Lipase slightly elevated. Feels like her pain has worsened over the past few days. Last BM was a few days ago but has a history of IBS. Denies straining with bowel movements.   OBJECTIVE:   BP 110/72    Pulse (!) 107    Ht 5\' 2"  (1.575 m)    Wt 136 lb 3.2 oz (61.8 kg)    SpO2 100%    BMI 24.91 kg/m   General: Patient well-appearing, in no acute distress. CV: RRR, no murmurs or gallops auscultated Resp: CTAB Abdomen: soft, epigastric tenderness noted on palpation, diffuse tenderness with deep palpation, presence of bowel sounds  ASSESSMENT/PLAN:   GERD (gastroesophageal reflux disease) -symptoms seem most consistent with GERD, low concern for appendicitis or diverticulitis given distribution of pain. Lipase slightly elevated but exam does not seem to correlate with clinical picture of pancreatitis.  -protonix daily prescribed -maintain food diary and bring to follow up -ED provider placed GI referral, encouraged to follow up with GI once appointment becomes available -follow up in 2 weeks  -ED precautions discussed   Constipation -given diffuse pain, symptoms may also be due to constipation -miralax and senna daily, adjust as appropriate for goal of 1 BM every 1-2 days      Donney Dice, [place]

## 2021-10-23 NOTE — Patient Instructions (Addendum)
It was great seeing you today!  Today we discussed your abdominal pain. I am sorry that you are in pain, I think this could partially be due to reflux and partially due to constipation.   Regarding the reflux, please take protonix 20 mg daily. Please make sure to avoid foods that cause your symptoms to worsen. Please keep a food diary and note any foods or times of the day when your symptoms are worse. You should receive a call from the GI office as a GI referral has been placed.   I have prescribed miralax and senna. Please take a capful of miralax daily and senna tablet daily to ensure at least one soft BM daily. Please make sure that you incorporate fiber in your diet. Constipation can cause a lot of stomach pain so we want to make sure you are going regularly (at least 1 bowel movement every 1-2 days).   Please follow up at your next scheduled appointment in 2 weeks, if anything arises between now and then, please don't hesitate to contact our office.   Thank you for allowing [name] to be a part of your medical care!  Thank you, Dr. Larae Grooms

## 2021-10-23 NOTE — Assessment & Plan Note (Signed)
-  symptoms seem most consistent with GERD, low concern for appendicitis or diverticulitis given distribution of pain. Lipase slightly elevated but exam does not seem to correlate with clinical picture of pancreatitis.  -protonix daily prescribed -maintain food diary and bring to follow up -ED provider placed GI referral, encouraged to follow up with GI once appointment becomes available -follow up in 2 weeks  -ED precautions discussed

## 2021-10-30 ENCOUNTER — Ambulatory Visit: Payer: Medicaid Other

## 2021-11-03 ENCOUNTER — Encounter: Payer: Self-pay | Admitting: Family Medicine

## 2021-11-03 ENCOUNTER — Ambulatory Visit
Admission: RE | Admit: 2021-11-03 | Discharge: 2021-11-03 | Disposition: A | Discharge status: Home / Self Care | Payer: 59 | Source: Ambulatory Visit | Attending: Family Medicine | Admitting: Family Medicine

## 2021-11-03 DIAGNOSIS — Z1231 Encounter for screening mammogram for malignant neoplasm of breast: Secondary | ICD-10-CM

## 2021-11-28 ENCOUNTER — Telehealth (HOSPITAL_COMMUNITY): Payer: Self-pay | Admitting: Psychiatry

## 2021-11-28 NOTE — Telephone Encounter (Signed)
VYVANSE written 09/01/21 with 0 refills, writted to "fill monthly until 02/28/22".   Patient states [facility] [address] requires provider send new script. Last office visit 09/01/21;  05/26/21;  02/28/21;  and 12/05/20.

## 2021-11-30 ENCOUNTER — Other Ambulatory Visit (HOSPITAL_COMMUNITY): Payer: Self-pay | Admitting: Psychiatry

## 2021-11-30 DIAGNOSIS — F900 Attention-deficit hyperactivity disorder, predominantly inattentive type: Secondary | ICD-10-CM

## 2021-11-30 MED ORDER — LISDEXAMFETAMINE DIMESYLATE 70 MG PO CAPS: 70.0000 mg | ORAL_CAPSULE | ORAL | 0 refills | Status: DC

## 2021-11-30 NOTE — Telephone Encounter (Signed)
Refill of Vyvanse sent for 30 day supply ?

## 2021-12-25 ENCOUNTER — Encounter: Payer: Self-pay | Admitting: Obstetrics & Gynecology

## 2021-12-25 ENCOUNTER — Other Ambulatory Visit (HOSPITAL_COMMUNITY)
Admission: RE | Admit: 2021-12-25 | Discharge: 2021-12-25 | Disposition: A | Discharge status: Home / Self Care | Payer: 59 | Source: Ambulatory Visit | Attending: Obstetrics & Gynecology | Admitting: Obstetrics & Gynecology

## 2021-12-25 ENCOUNTER — Ambulatory Visit (INDEPENDENT_AMBULATORY_CARE_PROVIDER_SITE_OTHER): Payer: 59 | Admitting: Obstetrics & Gynecology

## 2021-12-25 VITALS — BP 120/80 | HR 74 | Resp 16 | Ht 61.5 in | Wt 134.0 lb

## 2021-12-25 DIAGNOSIS — Z01419 Encounter for gynecological examination (general) (routine) without abnormal findings: Secondary | ICD-10-CM | POA: Diagnosis not present

## 2021-12-25 DIAGNOSIS — N951 Menopausal and female climacteric states: Secondary | ICD-10-CM

## 2021-12-25 DIAGNOSIS — Z789 Other specified health status: Secondary | ICD-10-CM | POA: Diagnosis not present

## 2021-12-25 NOTE — Progress Notes (Signed)
? ? ?Tracy Weiss 04-12-1966 [id number] ? ? ?History:    56 y.o. G1P1L2 Single. [employer]. Twin girls. 3 grand-children.   ? ?RP:  New patient presenting for annual gyn exam  ? ?HPI: Perimenopausal with occasional hot flushes.  S/P Endometrial Ablation.  Had vaginal spotting in 09/2021.  Pelvic US 04/2021 thin endometrial line at 4 mm.  Using condoms. Last Pap Neg 03/2019.  No h/o abnormal Pap.  Pap reflex today.  Breasts normal.  Mammo Neg 11/03/2021.  BMI 24.91.  Good fitness/nutrition.  Health Labs with Fam DO.  Colono Neg 12/2019, 10 yr schedule. ? ? ?Past medical history,surgical history, family history and social history were all reviewed and documented in the EPIC chart. ? ?Gynecologic History ?No LMP recorded. Patient has had an ablation. ? ?Obstetric History ?OB History  ?Gravida Para Term Preterm AB Living  ?1         2  ?SAB IAB Ectopic Multiple Live Births  ?      1 2  ?  ?# Outcome Date GA Lbr Len/2nd Weight Sex Delivery Anes PTL Lv  ?1 Gravida      CS-LTranv     ? ? ? ?ROS: A ROS was performed and pertinent positives and negatives are included in the history. ?GENERAL: No fevers or chills. HEENT: No change in vision, no earache, sore throat or sinus congestion. NECK: No pain or stiffness. CARDIOVASCULAR: No chest pain or pressure. No palpitations. PULMONARY: No shortness of breath, cough or wheeze. GASTROINTESTINAL: No abdominal pain, nausea, vomiting or diarrhea, melena or bright red blood per rectum. GENITOURINARY: No urinary frequency, urgency, hesitancy or dysuria. MUSCULOSKELETAL: No joint or muscle pain, no back pain, no recent trauma. DERMATOLOGIC: No rash, no itching, no lesions. ENDOCRINE: No polyuria, polydipsia, no heat or cold intolerance. No recent change in weight. HEMATOLOGICAL: No anemia or easy bruising or bleeding. NEUROLOGIC: No headache, seizures, numbness, tingling or weakness. PSYCHIATRIC: No depression, no loss of interest in normal activity or change in sleep pattern.  ?   ? ?Exam: ? ? ?BP 120/80   Pulse 74   Resp 16   Ht 5' 1.5" (1.562 m)   Wt 134 lb (60.8 kg)   BMI 24.91 kg/m?  ? ?Body mass index is 24.91 kg/m?. ? ?General appearance : Well developed well nourished female. No acute distress ?HEENT: Eyes: no retinal hemorrhage or exudates,  Neck supple, trachea midline, no carotid bruits, no thyroidmegaly ?Lungs: Clear to auscultation, no rhonchi or wheezes, or rib retractions  ?Heart: Regular rate and rhythm, no murmurs or gallops ?Breast:Examined in sitting and supine position were symmetrical in appearance, no palpable masses or tenderness,  no skin retraction, no nipple inversion, no nipple discharge, no skin discoloration, no axillary or supraclavicular lymphadenopathy ?Abdomen: no palpable masses or tenderness, no rebound or guarding ?Extremities: no edema or skin discoloration or tenderness ? ?Pelvic: Vulva: Normal ?            Vagina: No gross lesions or discharge ? Cervix: No gross lesions or discharge.  Pap reflex done. ? Uterus  AV, normal size, shape and consistency, non-tender and mobile ? Adnexa  Without masses or tenderness ? Anus: Normal ? ?Pelvic US 04/2021: ?Uterus: Measurements: 12.9 x 6.2 x 7.6 cm = volume: 313 mL. Heterogeneous ?myometrium with asymmetric thickening of the posterior myometrial ?wall versus anterior. Findings suggest adenomyosis. Ill-defined area ?of somewhat more focal abnormal echogenicity is seen at the ?posterior mid uterus 4.0 x 2.9 x 3.8 cm, could  represent focal ?adenomyosis or a subtle submucosal leiomyoma. ?Endometrium: Thickness: 4 mm.  No endometrial fluid or mass. ?Right ovary: Measurements: 3.7 x 2.0 x 2.4 cm = volume: 9.1 mL. Normal morphology ?without mass. ?Left ovary: Measurements: 4.3 x 3.4 x 3.8 cm = volume: 29.3 mL. Dominant ?physiologic follicle 3.6 cm diameter; No follow up imaging ?recommended. Note: This recommendation does not apply to ?premenarchal patients or to those with increased risk (genetic, ?family history,  elevated tumor markers or other high-risk factors) ?of ovarian cancer. Reference: Radiology 2019 Nov; 293(2):359-371. ?Trace free pelvic fluid. Additional LEFT adnexal paraovarian cyst ?3.8 cm greatest size ? ? ?Assessment/Plan:  57 y.o. female for annual exam  ? ?1. Encounter for routine gynecological examination with Papanicolaou smear of cervix ? Perimenopausal with occasional hot flushes.  S/P Endometrial Ablation.  Had vaginal spotting in 09/2021.  Pelvic US 04/2021 thin endometrial line at 4 mm.  Using condoms. Last Pap Neg 03/2019.  No h/o abnormal Pap.  Pap reflex today.  Breasts normal.  Mammo Neg 11/03/2021.  BMI 24.91.  Good fitness/nutrition.  Health Labs with Fam DO.  Colono Neg 12/2019, 10 yr schedule. ?- Cytology - PAP( [place]) ? ?2. Perimenopause ?Will try Ashwagandha for occasional hot flushes.  Abnormal perimenopausal bleeding precautions discussed. ? ?3. Uses condoms  ? ?Princess Bruins MD, 3:49 PM 12/25/2021 ? ?  ?

## 2021-12-28 LAB — CYTOLOGY - PAP
Comment: NEGATIVE
Diagnosis: NEGATIVE
High risk HPV: NEGATIVE

## 2021-12-29 ENCOUNTER — Encounter: Payer: No Typology Code available for payment source | Admitting: Obstetrics & Gynecology

## 2022-01-05 ENCOUNTER — Ambulatory Visit: Payer: 59 | Admitting: Family Medicine

## 2022-01-08 ENCOUNTER — Ambulatory Visit (INDEPENDENT_AMBULATORY_CARE_PROVIDER_SITE_OTHER): Payer: 59 | Admitting: Family Medicine

## 2022-01-08 VITALS — BP 112/76 | HR 87 | Ht 61.5 in | Wt 139.4 lb

## 2022-01-08 DIAGNOSIS — R413 Other amnesia: Secondary | ICD-10-CM | POA: Diagnosis not present

## 2022-01-08 DIAGNOSIS — F411 Generalized anxiety disorder: Secondary | ICD-10-CM | POA: Diagnosis not present

## 2022-01-08 DIAGNOSIS — F900 Attention-deficit hyperactivity disorder, predominantly inattentive type: Secondary | ICD-10-CM

## 2022-01-08 DIAGNOSIS — F0781 Postconcussional syndrome: Secondary | ICD-10-CM

## 2022-01-08 NOTE — Patient Instructions (Addendum)
Thank you for coming in today.  ? ?Call [facility] to schedule your appointment.  ?Phone: [phone] ? ?Let me know if I need to change anything.  ? ?Recheck as needed.  ? ?Let me know what that medicine was.  ? ?

## 2022-01-08 NOTE — Progress Notes (Signed)
? ?I, Wendy Poet, LAT, ATC, am serving as scribe for Dr. [name]. ? ?Tracy Weiss is a 56 y.o. female who presents to [place] at [facility] today for f/u of post-concussion syndrome after suffering a concussion on 06/27/20 when she was struck in the head by a swing set that she was trying to disassemble.  She was last seen by Dr. [name] on 10/06/21 and noted con't issues w/ brain fog, memory, and concentration.  She is taking Vyvanse and has been referred to both neurology and neuro ophthalmology.  At her last visit, she was additionally referred for a neurospsychological evaluation.  Today, pt reports she has not been called to schedule the neuropsych eval. Pt c/o cont brainfog, "brain freezing" and memory issues. Pt notes improvement, but is still struggling.  She has continued mental fogginess.  She also expresses anxiety symptoms.  She notes in the past she took an as needed medication for anxiety but cannot remember what it was. ? ?Diagnostic imaging: Brain MRI- 10/10/20; CT head w/o contrast- 06/27/20 ? ?Pertinent review of systems: No fevers or chills ? ?Relevant historical information: Prior history of mild anxiety. ? ? ?Exam:  ?BP 112/76   Pulse 87   Ht 5' 1.5" (1.562 m)   Wt 139 lb 6.4 oz (63.2 kg)   SpO2 99%   BMI 25.91 kg/m?  ?General: Well Developed, well nourished, and in no acute distress.  ? ?Neuropsych: Oriented.  Expresses anxiety.  Expresses some inattention and confusion episodes and trouble managing tasks at work. ? ? ? ? ? ? ?Assessment and Plan: ?56 y.o. female with postconcussion syndrome.  Continued and ongoing.  This coexist with ADHD which is currently being managed with Vyvanse. ? ?We spent time talking about anxiety medication management.  I think this is probably a good idea.  She is not sure what she took previously but has a pill bottle at home and will let me know.  It sounds like is probably going to be an as needed medication in the benzodiazepine  class or progressively hydroxyzine or BuSpar.  All of these medications are going to be sedating and may cause some worsening confusion I do not think are a great idea.  We spent time talking about Prozac which I think is probably a good first option.  She will let me know what she had and we can figure it out from there in the near future. ? ?Additionally we spent time looking at neuropsychological referral.  I placed a new referral but then at the end of the visit in the letter section I found that she has a scheduled visit with neuropsychology in October.  Recommend that she call [place] neurology and discuss when the appointment is.  I think neuropsychological evaluation in the future is a good idea. ? ?We also spent time thinking about a work note and work accommodations.  New work note written.  Happy to fill out forms if needed. ? ?Recheck as needed. ? ? ?PDMP not reviewed this encounter. ?Orders Placed This Encounter  ?Procedures  ? Ambulatory referral to Neurology  ?  Referral Priority:   Routine  ?  Referral Type:   Consultation  ?  Referral Reason:   Specialty Services Required  ?  Requested Specialty:   Neurology  ?  Number of Visits Requested:   1  ? ?No orders of the defined types were placed in this encounter. ? ? ? ?Discussed warning signs or symptoms. Please see  discharge instructions. Patient expresses understanding. ? ? ?Total encounter time 30 minutes including face-to-face time with the patient and, reviewing past medical record, and charting on the date of service.   ? ? ?

## 2022-01-18 ENCOUNTER — Telehealth (INDEPENDENT_AMBULATORY_CARE_PROVIDER_SITE_OTHER): Payer: 59 | Admitting: Psychiatry

## 2022-01-18 DIAGNOSIS — F900 Attention-deficit hyperactivity disorder, predominantly inattentive type: Secondary | ICD-10-CM | POA: Diagnosis not present

## 2022-01-18 MED ORDER — LISDEXAMFETAMINE DIMESYLATE 70 MG PO CAPS: 70.0000 mg | ORAL_CAPSULE | ORAL | 0 refills | Status: DC

## 2022-01-18 NOTE — Progress Notes (Signed)
BH MD/PA/NP OP Progress Note  01/18/2022 2:48 PM Tracy Weiss  MRN:  [id number]  Virtual Visit via Telephone Note  I connected with Tracy Weiss on 01/18/22 at  2:30 PM EDT by telephone and verified that I am speaking with the correct person using two identifiers.  Location: Patient: home Provider: offsite   I discussed the limitations, risks, security and privacy concerns of performing an evaluation and management service by telephone and the availability of in person appointments. I also discussed with the patient that there may be a patient responsible charge related to this service. The patient expressed understanding and agreed to proceed.   I discussed the assessment and treatment plan with the patient. The patient was provided an opportunity to ask questions and all were answered. The patient agreed with the plan and demonstrated an understanding of the instructions.   The patient was advised to call back or seek an in-person evaluation if the symptoms worsen or if the condition fails to improve as anticipated.  I provided 10 minutes of non-face-to-face time during this encounter.   Tracy Grip, NP   Chief Complaint: Medication management  HPI: Tracy Weiss is a 56 year old female presenting to [facility] behavioral health outpatient for follow-up psychiatric evaluation.  Patient has a psychiatric history of anxiety and ADHD.  Her symptoms are managed with Vyvanse 70 mg daily.  Patient reports that her medication is effective with managing her symptoms and that she is medication compliant.  Patient denies adverse medication effects or need for dosage adjustment today.  No medication changes today.   Visit Diagnosis:    ICD-10-CM   1. Attention deficit hyperactivity disorder (ADHD), predominantly inattentive type  F90.0 lisdexamfetamine (VYVANSE) 70 MG capsule      Past Psychiatric History: Anxiety and ADHD  Past Medical History:  Past Medical History:   Diagnosis Date   Abnormal uterine bleeding (AUB)    ADHD    Allergy    Anxiety    Diabetes mellitus without complication ([place])    type 2   Gallstones 09/2016   GERD (gastroesophageal reflux disease)    after gallbladder removed   Hyperlipidemia    per pt "taking as preventative"   IBS (irritable bowel syndrome)     Past Surgical History:  Procedure Laterality Date   CESAREAN SECTION     CHOLECYSTECTOMY N/A 09/11/2016   Procedure: LAPAROSCOPIC CHOLECYSTECTOMY WITH INTRAOPERATIVE CHOLANGIOGRAM;  Surgeon: Donnie Mesa, MD;  Location: [place];  Service: General;  Laterality: N/A;   DILATATION AND CURETTAGE/HYSTEROSCOPY WITH MINERVA N/A 02/04/2019   Procedure: DILATATION AND CURETTAGE/ABLATION WITH MINERVA;  Surgeon: Emily Filbert, MD;  Location: [place];  Service: Gynecology;  Laterality: N/A;  MINERVA ABLATION   IR RADIOLOGIST EVAL & MGMT  04/07/2020   tubal ligaton reversal  2017    Family Psychiatric History: N/A  Family History:  Family History  Problem Relation Age of Onset   Cancer Mother        Metastatic with Unknown Primary; Stomach was involved   Diabetes Father    Heart disease Father    Breast cancer Neg Hx    Colon cancer Neg Hx    Esophageal cancer Neg Hx    Rectal cancer Neg Hx    Stomach cancer Neg Hx     Social History:  Social History   Socioeconomic History   Marital status: Single    Spouse name: Not on file   Number of children: Not on file  Years of education: Not on file   Highest education level: Master's degree (e.g., MA, MS, MEng, MEd, MSW, MBA)  Occupational History   Not on file  Tobacco Use   Smoking status: Never   Smokeless tobacco: Never  Vaping Use   Vaping Use: Never used  Substance and Sexual Activity   Alcohol use: Not Currently   Drug use: No   Sexual activity: Not Currently    Partners: Male    Birth control/protection: Abstinence  Other Topics Concern   Not on file  Social History Narrative   Not on file    Social Determinants of Health   Financial Resource Strain: Not on file  Food Insecurity: Not on file  Transportation Needs: Not on file  Physical Activity: Not on file  Stress: Not on file  Social Connections: Not on file    Allergies: No Known Allergies  Metabolic Disorder Labs: Lab Results  Component Value Date   HGBA1C 6.3 06/12/2021   Lab Results  Component Value Date   PROLACTIN 11.5 10/07/2018   PROLACTIN 10.3 06/11/2012   Lab Results  Component Value Date   CHOL 145 06/12/2021   TRIG 52 06/12/2021   HDL 71 06/12/2021   CHOLHDL 2.0 06/12/2021   VLDL 26 08/13/2016   LDLCALC 63 06/12/2021   LDLCALC 119 (H) 12/22/2019   Lab Results  Component Value Date   TSH 0.694 10/07/2018   TSH 1.080 09/23/2017    Therapeutic Level Labs: No results found for: LITHIUM No results found for: VALPROATE No components found for:  CBMZ  Current Medications: Current Outpatient Medications  Medication Sig Dispense Refill   Insulin Lispro Prot & Lispro (HUMALOG MIX 75/25 KWIKPEN) (75-25) 100 UNIT/ML Kwikpen Inject 10 Units into the skin in the morning and at bedtime. 15 mL 11   latanoprost (XALATAN) 0.005 % ophthalmic solution SMARTSIG:In Eye(s)     lisdexamfetamine (VYVANSE) 70 MG capsule Take 1 capsule (70 mg total) by mouth every morning. 30 capsule 0   metroNIDAZOLE (METROGEL) 0.75 % vaginal gel Place 1 Applicatorful vaginally at bedtime. (Patient not taking: Reported on 12/25/2021) 70 g 0   olmesartan-hydrochlorothiazide (BENICAR HCT) 40-25 MG tablet TAKE ONE TABLET BY MOUTH DAILY (Patient taking differently: Takes sometimes per patient) 60 tablet 0   pantoprazole (PROTONIX) 20 MG tablet Take 1 tablet (20 mg total) by mouth daily. 14 tablet 0   Prenatal Vit-Fe Fumarate-FA (PRENATAL VITAMIN PO) Take 1 tablet by mouth.      RESTASIS 0.05 % ophthalmic emulsion 1 drop 2 (two) times daily.     rosuvastatin (CRESTOR) 20 MG tablet TAKE ONE TABLET BY MOUTH DAILY 90 tablet 1    tirzepatide (MOUNJARO) 10 MG/0.5ML Pen Inject 10 mg into the skin every 14 (fourteen) days.     TYRVAYA 0.03 MG/ACT SOLN Place 1 spray into both nostrils 2 (two) times daily.     VYZULTA 0.024 % SOLN Apply 1 drop to eye daily.     No current facility-administered medications for this visit.     Musculoskeletal: Strength & Muscle Tone: n/a virtual visit Gait & Station: n/a Patient leans: N/A  Psychiatric Specialty Exam: Review of Systems  Psychiatric/Behavioral:  Negative for hallucinations, self-injury and suicidal ideas.   All other systems reviewed and are negative.  There were no vitals taken for this visit.There is no height or weight on file to calculate BMI.  General Appearance: NA  Eye Contact:  NA  Speech:  Clear and Coherent  Volume:  Normal  Mood:  Euthymic  Affect:  NA  Thought Process:  Goal Directed  Orientation:  Full (Time, Place, and Person)  Thought Content: Logical   Suicidal Thoughts:  No  Homicidal Thoughts:  No  Memory:  good  Judgement:  Good  Insight:  Good  Psychomotor Activity:  NA  Concentration:  good  Recall:  Good  Fund of Knowledge: Good  Language: Good  Akathisia:  NA  Handed:  Right  AIMS (if indicated): not done  Assets:  Communication Skills Desire for Improvement  ADL's:  Intact  Cognition: WNL  Sleep:  Good   Screenings: GAD-7    Flowsheet Row Video Visit from 05/26/2021 in [facility] Office Visit from 04/12/2021 in [facility] [place] [facility] Video Visit from 02/28/2021 in [facility] Office Visit from 07/13/2020 in [facility] [place] [facility] Office Visit from 12/03/2019 in [place] for [facility]  Total GAD-7 Score '7 5 2 1 7      '$ PHQ2-9    [place] Office Visit from 06/12/2021 in [place] Video Visit from 05/26/2021 in [facility] Office Visit from 04/12/2021 in [facility] [place] at [facility] Video Visit from 02/28/2021 in [facility] Office Visit from 09/09/2020 in [place]  PHQ-2 Total Score '4 2 2 '$ 0 1  PHQ-9 Total Score '12 9 8 '$ -- 4      Flowsheet Row ED from 10/21/2021 in [place] Video Visit from 02/28/2021 in [place] No Risk No Risk        Assessment and Plan: Tracy Weiss is a 56 year old female presenting to [facility] behavioral health outpatient for follow-up psychiatric evaluation.  Patient has a psychiatric history of anxiety and ADHD.  Her symptoms are managed with Vyvanse 70 mg daily.  Patient reports that her medication is effective with managing her symptoms and that she is medication compliant.  Patient denies adverse medication effects or need for dosage adjustment today.  No medication changes today.  Vyvanse refilled at current dosage.    Collaboration of Care: Collaboration of Care: Medication Management AEB medication E scribed to patient's preferred pharmacy to bridge patient until her attending psychiatric provider is able to resume care.  1. Attention deficit hyperactivity disorder (ADHD), predominantly inattentive type  - lisdexamfetamine (VYVANSE) 70 MG capsule; Take 1 capsule (70 mg total) by mouth every morning.  Dispense: 30 capsule; Refill: 0   Return to care in 3 months  Patient/Guardian was advised Release of Information must be obtained prior to any record release in order to collaborate their care with an outside provider. Patient/Guardian was advised if they have not already done so to contact the registration department to sign all necessary forms in order for [name] to release information regarding their care.   Consent: Patient/Guardian gives verbal consent for treatment and assignment of benefits for  services provided during this visit. Patient/Guardian expressed understanding and agreed to proceed.    Tracy Grip, NP 01/18/2022, 2:48 PM

## 2022-01-22 ENCOUNTER — Telehealth: Payer: Self-pay | Admitting: Pharmacist

## 2022-01-22 NOTE — Telephone Encounter (Signed)
Patient called requesting sample support with Mounjaro (tirzepatide)  I shared with her that samples for multiple GLP agents including Mounjaro have been scarce over the last 6-12 months.    She stated she has been doing well with her weight at 137-140 # And blood sugars doing very well.   She anticipates following up with her PCP in the next 1-2 months.

## 2022-02-06 ENCOUNTER — Encounter: Payer: Self-pay | Admitting: *Deleted

## 2022-02-20 ENCOUNTER — Ambulatory Visit: Payer: Medicaid Other | Admitting: Family Medicine

## 2022-02-26 ENCOUNTER — Telehealth: Payer: Self-pay | Admitting: Family Medicine

## 2022-02-26 ENCOUNTER — Telehealth: Payer: Self-pay

## 2022-02-26 DIAGNOSIS — E119 Type 2 diabetes mellitus without complications: Secondary | ICD-10-CM

## 2022-02-26 MED ORDER — TIRZEPATIDE 12.5 MG/0.5ML ~~LOC~~ SOAJ: 12.5000 mg | SUBCUTANEOUS | 3 refills | Status: DC

## 2022-02-26 NOTE — Telephone Encounter (Signed)
Patient called and reported efficacy with use of Mounjaro 10mg  weekly and Humalog 75/25 insulin once daily ~ 10 units.   Given recent A1c of ~ 6 we discussed options.  Following discussion, agreed to increase Mounjaro to 12.5mg  weekly and Discontinue insulin completely.   Patient to follow-up with Dr. Clayborne Artist 6/29 at 3:30  Reassess progress and control at that time.  I would be happy to see patient as well at that visit.

## 2022-02-26 NOTE — Telephone Encounter (Signed)
Patient came in stating that she is unable to get her prescription of 10mg  Mounjaro due to pharmacy being out of stock. She would like to have a prescription sent in for 12 mg if possible. She states that the [facility] [address] is holding it for her.

## 2022-03-01 ENCOUNTER — Other Ambulatory Visit: Payer: Self-pay

## 2022-03-01 ENCOUNTER — Ambulatory Visit (INDEPENDENT_AMBULATORY_CARE_PROVIDER_SITE_OTHER): Payer: 59 | Admitting: Family Medicine

## 2022-03-01 ENCOUNTER — Encounter: Payer: Self-pay | Admitting: Family Medicine

## 2022-03-01 ENCOUNTER — Ambulatory Visit (HOSPITAL_COMMUNITY)
Admission: RE | Admit: 2022-03-01 | Discharge: 2022-03-01 | Disposition: A | Discharge status: Home / Self Care | Payer: 59 | Source: Ambulatory Visit | Attending: Family Medicine | Admitting: Family Medicine

## 2022-03-01 VITALS — BP 136/89 | HR 103 | Ht 61.5 in | Wt 133.2 lb

## 2022-03-01 DIAGNOSIS — D649 Anemia, unspecified: Secondary | ICD-10-CM | POA: Diagnosis not present

## 2022-03-01 DIAGNOSIS — Z794 Long term (current) use of insulin: Secondary | ICD-10-CM

## 2022-03-01 DIAGNOSIS — Z01818 Encounter for other preprocedural examination: Secondary | ICD-10-CM | POA: Diagnosis not present

## 2022-03-01 DIAGNOSIS — E119 Type 2 diabetes mellitus without complications: Secondary | ICD-10-CM

## 2022-03-01 DIAGNOSIS — Z01810 Encounter for preprocedural cardiovascular examination: Secondary | ICD-10-CM | POA: Insufficient documentation

## 2022-03-01 DIAGNOSIS — I10 Essential (primary) hypertension: Secondary | ICD-10-CM

## 2022-03-01 LAB — POCT GLYCOSYLATED HEMOGLOBIN (HGB A1C): HbA1c, POC (controlled diabetic range): 5.3 % (ref 0.0–7.0)

## 2022-03-01 NOTE — Assessment & Plan Note (Signed)
A1c today 5.3.  Recently discontinued insulin and is on Mounjaro 12.5 mg weekly. - Continue current management - CMP today

## 2022-03-01 NOTE — Patient Instructions (Signed)
-   We are getting all of the labs that you are surgeon has requested.   - You can get your checks x-ray done over at [address] - Once all of the results have come back, I will send the clearance form off to the providers office.

## 2022-03-01 NOTE — Progress Notes (Signed)
55

## 2022-03-01 NOTE — Assessment & Plan Note (Signed)
Blood pressure mildly elevated 136/89, patient was very active prior to blood pressure being taken.  No concerns and no changes at this time.

## 2022-03-01 NOTE — Progress Notes (Addendum)
    SUBJECTIVE:   CHIEF COMPLAINT / HPI:   Type 2 diabetes Patient has been working hard with Dr. Everitt Amber to get control of her diabetes.  Last A1c was 6.3.  Recently switched to Mounjaro 12.5 mg and has discontinued her insulin.  Preop clearance Patient is going for a breast augmentation and is in need of several labs as dictated by surgeon.  Labs include PT/INR, CMP, PTT, CBC with differential, hemoglobin A1c. Surgeon also requiring a stress test, do not feel this is appropriate given patient is able to maintain 4 METS without difficulty and this is not medically indicated.  Will obtain chest x-ray and EKG as requested.  Mammogram previously completed. Patient denies any history of chest pain/dyspnea with exertion. Does not know of any personal or family history of bleeding issues or concerns. Does not have any known history of reactions to anesthesia.    PERTINENT  PMH / PSH: Reviewed  OBJECTIVE:   BP 136/89   Pulse (!) 103   Ht 5' 1.5" (1.562 m)   Wt 133 lb 3.2 oz (60.4 kg)   SpO2 100%   BMI 24.76 kg/m   General -- oriented x3, pleasant and cooperative. HEENT -- Head is normocephalic. PERRLA. EOMI. Ears, nose and throat were benign. Neck -- supple; full ROM Integument -- intact. No rash, erythema, or ecchymoses.  Chest -- good expansion. Lungs clear to auscultation. Cardiac -- RRR. No murmurs noted.  Abdomen -- soft, nontender. No masses palpable. Bowel sounds present. Genital, rectal and breast exam -- deferred. CNS -- cranial nerves II through XII grossly intact.  Extremeties - no tenderness or effusions noted. ROM good. 5/5 bilateral strength. Dorsalis pedis pulses present and symmetrical.  EKG: NSR, atrial enlargement noted, no ST elevations or depressions or T wave inversions.    ASSESSMENT/PLAN:   Type 2 diabetes mellitus without complication, with long-term current use of insulin (HCC) A1c today 5.3.  Recently discontinued insulin and is on Mounjaro 12.5 mg  weekly. - Continue current management - CMP today  Essential hypertension, benign Blood pressure mildly elevated 136/89, patient was very active prior to blood pressure being taken.  No concerns and no changes at this time.  Anemia History of abnormal CBCs, mildly low hemoglobin on last check. - CBC with differential today   Preop evaluation Evaluation labs and examinations requested from surgeon are as below.  Patient is able to tolerate 4 METS of activity and there is very low risk for cardiac concerns so a stress test was not medically indicated.  Mammogram was previously completed as well.  All labs and imaging were ordered as below, discussed with patient that cannot guarantee insurance will cover these cost. - PT/INR - CMP - PTT - CBC with differential - A1c - CXR - EKG  Kimberlynn Lumbra, DO [place]

## 2022-03-02 ENCOUNTER — Ambulatory Visit
Admission: RE | Admit: 2022-03-02 | Discharge: 2022-03-02 | Disposition: A | Discharge status: Home / Self Care | Payer: 59 | Source: Ambulatory Visit | Attending: Family Medicine | Admitting: Family Medicine

## 2022-03-02 ENCOUNTER — Other Ambulatory Visit: Payer: 59

## 2022-03-02 ENCOUNTER — Inpatient Hospital Stay (HOSPITAL_COMMUNITY): Admission: RE | Admit: 2022-03-02 | Payer: 59 | Source: Ambulatory Visit

## 2022-03-02 DIAGNOSIS — I10 Essential (primary) hypertension: Secondary | ICD-10-CM

## 2022-03-02 DIAGNOSIS — D649 Anemia, unspecified: Secondary | ICD-10-CM

## 2022-03-02 DIAGNOSIS — Z01818 Encounter for other preprocedural examination: Secondary | ICD-10-CM

## 2022-03-02 DIAGNOSIS — E119 Type 2 diabetes mellitus without complications: Secondary | ICD-10-CM

## 2022-03-03 LAB — CBC WITH DIFFERENTIAL/PLATELET
Basophils Absolute: 0 10*3/uL (ref 0.0–0.2)
Basos: 1 %
EOS (ABSOLUTE): 0 10*3/uL (ref 0.0–0.4)
Eos: 0 %
Hematocrit: 38.9 % (ref 34.0–46.6)
Hemoglobin: 13.4 g/dL (ref 11.1–15.9)
Immature Grans (Abs): 0 10*3/uL (ref 0.0–0.1)
Immature Granulocytes: 0 %
Lymphocytes Absolute: 1.8 10*3/uL (ref 0.7–3.1)
Lymphs: 36 %
MCH: 29.6 pg (ref 26.6–33.0)
MCHC: 34.4 g/dL (ref 31.5–35.7)
MCV: 86 fL (ref 79–97)
Monocytes Absolute: 0.3 10*3/uL (ref 0.1–0.9)
Monocytes: 6 %
Neutrophils Absolute: 2.9 10*3/uL (ref 1.4–7.0)
Neutrophils: 57 %
Platelets: 348 10*3/uL (ref 150–450)
RBC: 4.52 x10E6/uL (ref 3.77–5.28)
RDW: 13.2 % (ref 11.7–15.4)
WBC: 5 10*3/uL (ref 3.4–10.8)

## 2022-03-03 LAB — COMPREHENSIVE METABOLIC PANEL
ALT: 16 IU/L (ref 0–32)
AST: 20 IU/L (ref 0–40)
Albumin/Globulin Ratio: 1.7 (ref 1.2–2.2)
Albumin: 4.8 g/dL (ref 3.8–4.9)
Alkaline Phosphatase: 56 IU/L (ref 44–121)
BUN/Creatinine Ratio: 18 (ref 9–23)
BUN: 13 mg/dL (ref 6–24)
Bilirubin Total: 0.5 mg/dL (ref 0.0–1.2)
CO2: 26 mmol/L (ref 20–29)
Calcium: 10.1 mg/dL (ref 8.7–10.2)
Chloride: 100 mmol/L (ref 96–106)
Creatinine, Ser: 0.74 mg/dL (ref 0.57–1.00)
Globulin, Total: 2.8 g/dL (ref 1.5–4.5)
Glucose: 62 mg/dL — ABNORMAL LOW (ref 70–99)
Potassium: 4.4 mmol/L (ref 3.5–5.2)
Sodium: 140 mmol/L (ref 134–144)
Total Protein: 7.6 g/dL (ref 6.0–8.5)
eGFR: 96 mL/min/{1.73_m2} (ref >59)

## 2022-03-03 LAB — APTT: aPTT: 25 s (ref 24–33)

## 2022-03-03 LAB — PROTIME-INR
INR: 1 (ref 0.9–1.2)
Prothrombin Time: 10.5 s (ref 9.1–12.0)

## 2022-03-05 ENCOUNTER — Encounter: Payer: Self-pay | Admitting: Family Medicine

## 2022-03-08 ENCOUNTER — Other Ambulatory Visit: Payer: Self-pay | Admitting: Family Medicine

## 2022-03-08 ENCOUNTER — Ambulatory Visit (INDEPENDENT_AMBULATORY_CARE_PROVIDER_SITE_OTHER): Payer: 59 | Admitting: Family Medicine

## 2022-03-08 ENCOUNTER — Telehealth: Payer: Self-pay | Admitting: Family Medicine

## 2022-03-08 VITALS — BP 116/74 | HR 74 | Wt 137.6 lb

## 2022-03-08 DIAGNOSIS — J310 Chronic rhinitis: Secondary | ICD-10-CM

## 2022-03-08 DIAGNOSIS — R051 Acute cough: Secondary | ICD-10-CM | POA: Insufficient documentation

## 2022-03-08 DIAGNOSIS — J029 Acute pharyngitis, unspecified: Secondary | ICD-10-CM

## 2022-03-08 DIAGNOSIS — Z01818 Encounter for other preprocedural examination: Secondary | ICD-10-CM

## 2022-03-08 DIAGNOSIS — I517 Cardiomegaly: Secondary | ICD-10-CM

## 2022-03-08 HISTORY — DX: Acute cough: R05.1

## 2022-03-08 HISTORY — DX: Chronic rhinitis: J31.0

## 2022-03-08 HISTORY — DX: Acute pharyngitis, unspecified: J02.9

## 2022-03-08 LAB — HM DIABETES EYE EXAM

## 2022-03-08 MED ORDER — AZELASTINE HCL 0.1 % NA SOLN
2.0000 | Freq: Two times a day (BID) | NASAL | 12 refills | Status: DC
Start: 2022-03-08 — End: 2022-03-29

## 2022-03-08 MED ORDER — BENZONATATE 100 MG PO CAPS: 100.0000 mg | ORAL_CAPSULE | Freq: Two times a day (BID) | ORAL | 0 refills | Status: DC | PRN

## 2022-03-08 NOTE — Telephone Encounter (Signed)
Patient called stating MIA Anastatic's, didn't receive chest X-rays or stress test.

## 2022-03-08 NOTE — Progress Notes (Signed)
Patient's elective procedure surgeon is requiring a stress test. Dicussed with attending Dr. Thompson Grayer and we will refer to cardiology to determine appropriate stress test and administer.   This is time sensitive due to pre-op clearance needing to be done ASAP per patient.   Tracy Blystone, DO

## 2022-03-08 NOTE — Progress Notes (Signed)
SUBJECTIVE:   CHIEF COMPLAINT / HPI: sore throat, cough   Patient reports that she started with a sore throat and sensation that she had drainage in the back of her throat  Cough is productive  She was having pain in the center of her forehead  She was also having left ear pain  She denies fevers and chills  She denies dyspnea or SOB  She reports coughing up mucous  She denies hemoptysis, sputum is yellow  She denies abdominal pain, diarrhea, nausea  She has not been around anyone that she knew was sick  She has tried theraflu at home for her symptoms which helped her to sleep     PERTINENT  PMH / PSH:  Hypertension Blood pressure IBS GERD Type 2 diabetes PCOS   OBJECTIVE:   BP 116/74   Pulse 74   Wt 137 lb 9.6 oz (62.4 kg)   SpO2 98%   BMI 25.58 kg/m   Physical Exam HENT:     Head: Normocephalic. No raccoon eyes, right periorbital erythema or left periorbital erythema.     Right Ear: Ear canal and external ear normal. No drainage or tenderness. A middle ear effusion is present. There is no impacted cerumen. No hemotympanum. Tympanic membrane is not perforated, erythematous or bulging.     Left Ear: Ear canal and external ear normal. No drainage or tenderness.  No middle ear effusion. There is no impacted cerumen. No hemotympanum. Tympanic membrane is not perforated, erythematous or bulging.     Nose: Congestion present. No nasal tenderness or rhinorrhea.     Right Nostril: No epistaxis.     Left Nostril: No epistaxis.     Right Turbinates: Enlarged.     Left Turbinates: Not enlarged.     Right Sinus: No maxillary sinus tenderness or frontal sinus tenderness.     Left Sinus: No maxillary sinus tenderness or frontal sinus tenderness.     Mouth/Throat:     Mouth: Mucous membranes are moist.     Pharynx: Posterior oropharyngeal erythema present. No oropharyngeal exudate.     Tonsils: No tonsillar exudate. 0 on the right. 0 on the left.  Cardiovascular:     Rate  and Rhythm: Normal rate and regular rhythm.     Heart sounds: Normal heart sounds. No murmur heard.    No friction rub.  Pulmonary:     Effort: Pulmonary effort is normal. No respiratory distress.     Breath sounds: Normal breath sounds. No wheezing, rhonchi or rales.  Musculoskeletal:     Cervical back: Normal range of motion and neck supple. No tenderness.  Lymphadenopathy:     Cervical: No cervical adenopathy.      ASSESSMENT/PLAN:   Acute cough Patient Respiratory distress Counseled patient that she could try home remedies such as honey or warm teas to help with the cough, patient declined recommendation for honey stating that she wants to control her blood glucose given diabetes We will prescribe Tessalon Perles to be used as needed twice daily Recommended that patient follow-up if symptoms persist or do not improve over the course of the  Patient was agreeable with this plan   Sore throat Given age and presence of cough, low suspicion for strep pharyngitis Ears do not appear to have purulent effusion nor erythema or bulging so we will not test for strep at this time Suspect that this is likely due to viral causes and congestion Recommended throat spray and oral hydration make sure to  avoid spicy crunchy foods.  We will irritate the oropharynx   Rhinitis Prescribed azelastine, 2 sprays twice daily       Eulis Foster, MD [place]

## 2022-03-08 NOTE — Assessment & Plan Note (Signed)
Patient Respiratory distress Counseled patient that she could try home remedies such as honey or warm teas to help with the cough, patient declined recommendation for honey stating that she wants to control her blood glucose given diabetes We will prescribe Tessalon Perles to be used as needed twice daily Recommended that patient follow-up if symptoms persist or do not improve over the course of the  Patient was agreeable with this plan

## 2022-03-08 NOTE — Patient Instructions (Signed)
   Is likely that you are having some type of virus causing your symptoms so we will test for COVID and flu to make sure it is not the specific viruses.  I will notify you of abnormal results.  In the meantime, recommend drinking warm teas to help soothe your throat as well as using throat spray that she can purchase over-the-counter.  For the congestion in your nose, I have prescribed a nasal spray for you to use 2 sprays per nostril twice daily.  I have also prescribed a medication to help with your cough.  If you begin to experience shortness of breath or difficulty breathing or lightheadedness or dizziness, recommend being evaluated in the urgent care or emergency department.

## 2022-03-08 NOTE — Assessment & Plan Note (Signed)
Given age and presence of cough, low suspicion for strep pharyngitis Ears do not appear to have purulent effusion nor erythema or bulging so we will not test for strep at this time Suspect that this is likely due to viral causes and congestion Recommended throat spray and oral hydration make sure to avoid spicy crunchy foods.  We will irritate the oropharynx

## 2022-03-08 NOTE — Assessment & Plan Note (Signed)
Prescribed azelastine, 2 sprays twice daily

## 2022-03-09 ENCOUNTER — Encounter: Payer: Self-pay | Admitting: Family Medicine

## 2022-03-09 NOTE — Telephone Encounter (Signed)
Patient called to confirm that the CXR and letter regarding the stress test have been placed in the clinic fax box and should go out today.    Leyani Gargus, DO

## 2022-03-09 NOTE — Telephone Encounter (Signed)
Patient calls regarding her surgery clearance. She says they are still needing her chest x-rays and a doctors letter stating a stress test is not medically necessary and why it's not necessary(I.e hasn't had heart attach or other heart issues in the past.) Patient states she dropped off a copy of her chest x-rays off at the office in the past couple days.   Doctors surgery clearance and chest x-rays need to be faxed to ATTN: Lab at fax number [phone]. Deadline for all this to be sent has been extended to this coming Monday, July 10th.   Patient also requests Dr. Oleh Genin to call her when then has been faxed over. Please advise.

## 2022-03-10 LAB — COVID-19, FLU A+B AND RSV
Influenza A, NAA: NOT DETECTED
Influenza B, NAA: NOT DETECTED
RSV, NAA: NOT DETECTED
SARS-CoV-2, NAA: NOT DETECTED

## 2022-03-13 ENCOUNTER — Telehealth: Payer: Self-pay

## 2022-03-13 MED ORDER — FLUCONAZOLE 150 MG PO TABS: 150.0000 mg | ORAL_TABLET | Freq: Once | ORAL | 0 refills | Status: AC

## 2022-03-13 NOTE — Telephone Encounter (Signed)
Patient calls nurse line requesting refill for diflucan. Patient states that she is prone to yeast infections and has been having vaginal irritation since last week.   Patient states that if you look in her chart, she has a history of recurrent yeast infections.   Please advise.   Talbot Grumbling, RN

## 2022-03-23 ENCOUNTER — Telehealth: Payer: Self-pay | Admitting: Family Medicine

## 2022-03-23 NOTE — Telephone Encounter (Signed)
Patient came stating that the letter she just received regarding her upcoming surgery has to come from the Cardiologist and not her pcp. Letter can be faxed to [facility] at [phone]. If there are any questions please call her

## 2022-03-29 ENCOUNTER — Ambulatory Visit (INDEPENDENT_AMBULATORY_CARE_PROVIDER_SITE_OTHER): Payer: 59 | Admitting: Cardiology

## 2022-03-29 ENCOUNTER — Encounter (HOSPITAL_BASED_OUTPATIENT_CLINIC_OR_DEPARTMENT_OTHER): Payer: Self-pay | Admitting: Cardiology

## 2022-03-29 VITALS — BP 128/82 | HR 108 | Ht 61.5 in | Wt 132.6 lb

## 2022-03-29 DIAGNOSIS — I10 Essential (primary) hypertension: Secondary | ICD-10-CM

## 2022-03-29 DIAGNOSIS — E785 Hyperlipidemia, unspecified: Secondary | ICD-10-CM

## 2022-03-29 DIAGNOSIS — Z7189 Other specified counseling: Secondary | ICD-10-CM

## 2022-03-29 DIAGNOSIS — Z01810 Encounter for preprocedural cardiovascular examination: Secondary | ICD-10-CM

## 2022-03-29 NOTE — Patient Instructions (Signed)
Medication Instructions:  No changes  *If you need a refill on your cardiac medications before your next appointment, please call your pharmacy*  Follow-Up: At [facility], you and your health needs are our priority.  As part of our continuing mission to provide you with exceptional heart care, we have created designated Provider Care Teams.  These Care Teams include your primary Cardiologist (physician) and Advanced Practice Providers (APPs -  Physician Assistants and Nurse Practitioners) who all work together to provide you with the care you need, when you need it.  We recommend signing up for the patient portal called "MyChart".  Sign up information is provided on this After Visit Summary.  MyChart is used to connect with patients for Virtual Visits (Telemedicine).  Patients are able to view lab/test results, encounter notes, upcoming appointments, etc.  Non-urgent messages can be sent to your provider as well.   To learn more about what you can do with MyChart, go to [email or web address].    Your next appointment:   2 year(s)  The format for your next appointment:   In Person  Provider:   Buford Dresser, MD    Other Instructions Please call our office with the fax number for your surgeon's office so we can send your clearance information. Thank you!  Important Information About Sugar

## 2022-03-29 NOTE — Progress Notes (Signed)
Cardiology Office Note:    Date:  03/30/2022   ID:  Tracy Weiss, DOB 12/31/1965, MRN [id number]  PCP:  Rise Patience, DO  Cardiologist:  Buford Dresser, MD  Referring MD: Rise Patience, DO     History of Present Illness:    Tracy Weiss is a 56 y.o. female with type II diabetes, no longer on insulin, who is seen for follow up today. I initially met her 07/24/19 as a new patient for evaluation of abnormal ECG, cardiovascular risk, and review of symptoms.  Prior history: from her initial referral note "EKG Patient reports that she had an EKG recently for a preop clearance and is frustrated because she feels like she needs a cardiology referral.  States that she has had enlargement of her heart seen on both this EKG and previous EKG in 2016 and no one has referred her to a cardiologist.  Mother passed away from a cardiac disorder.  Patient states that her OB told her that her PCP should have referred her to a cardiologist back in 2016. EKG on 6/1 showing normal sinus rhythm, possible left atrial enlargement, no significant change since 2016 (this was confirmed by Dr. Johnsie Cancel when EKG was obtained). QTc 467.  She is very frustrated and would like to see a cardiologist"  Cardiovascular risk factors: Prior clinical ASCVD: none Comorbid conditions: mild hypertension, diabetes. Denies chronic kidney disease. Denies hyperlipidemia, on statin due to diabetes risk Family history: mother died of cancer--denies any heart issues, dad died of unclear reasons ?heart issues. Mat gma, mat aunt, mat uncle all had heart issues. All had diabetes  Today: Has lost 60 lbs, no longer diabetic. Congratulated. Has done extensive fasting, up to 40 days, when she only consumes water. Feels this cleansed her bloodstream. Was on Victoza, tried [facility] but felt she was losing too much weight on this. She does tolerate Mounjaro every other week. No longer on insulin.  Initial BP very elevated today,  but she got lost and was on a stressful phone call at the time. Recheck normal. She feels that her body does not have high pressure, wants to get off of medications.  Had recent ECG 03/05/22, reviewed. Thinking about breast augmentation surgery. Needs preop evaluation for this. She recently joined a cycling team, bikes over 10 miles at a time. Takes stairs routinely without issue. Can easily achieve >8 METs. RCRI=0.  She declines ECG today as she just had one at her PCP and doesn't think her insurance will pay for another.  Past Medical History:  Diagnosis Date   Abnormal uterine bleeding (AUB)    ADHD    Allergy    Anxiety    Diabetes mellitus without complication ([place])    type 2   Gallstones 09/2016   GERD (gastroesophageal reflux disease)    after gallbladder removed   Hyperlipidemia    per pt "taking as preventative"   IBS (irritable bowel syndrome)    Past Surgical History:  Procedure Laterality Date   CESAREAN SECTION     CHOLECYSTECTOMY N/A 09/11/2016   Procedure: LAPAROSCOPIC CHOLECYSTECTOMY WITH INTRAOPERATIVE CHOLANGIOGRAM;  Surgeon: Donnie Mesa, MD;  Location: [place];  Service: General;  Laterality: N/A;   DILATATION AND CURETTAGE/HYSTEROSCOPY WITH MINERVA N/A 02/04/2019   Procedure: DILATATION AND CURETTAGE/ABLATION WITH MINERVA;  Surgeon: Emily Filbert, MD;  Location: [place];  Service: Gynecology;  Laterality: N/A;  MINERVA ABLATION   IR RADIOLOGIST EVAL & MGMT  04/07/2020   tubal ligaton reversal  2017     Current Meds  Medication Sig   lisdexamfetamine (VYVANSE) 70 MG capsule Take 1 capsule (70 mg total) by mouth every morning.   Prenatal Vit-Fe Fumarate-FA (PRENATAL VITAMIN PO) Take 1 tablet by mouth.    RESTASIS 0.05 % ophthalmic emulsion 1 drop 2 (two) times daily.   tirzepatide (MOUNJARO) 12.5 MG/0.5ML Pen Inject 12.5 mg into the skin once a week.     Allergies:   Patient has no known allergies.   Social History   Tobacco Use   Smoking  status: Never   Smokeless tobacco: Never  Vaping Use   Vaping Use: Never used  Substance Use Topics   Alcohol use: Not Currently   Drug use: No     Family Hx: The patient's family history includes Cancer in her mother; Diabetes in her father; Heart disease in her father. There is no history of Breast cancer, Colon cancer, Esophageal cancer, Rectal cancer, or Stomach cancer.  ROS:   Please see the history of present illness.    ROS otherwise unremarkable   Prior CV studies:   The following studies were reviewed today:  No prior cardiac studies  Labs/Other Tests and Data Reviewed:    EKG:  Personally reviewed today 03/01/22 (Dr. Oleh Genin) NSR at 92 bpm, possible LAE/LVH  Recent Labs: 03/02/2022: ALT 16; BUN 13; Creatinine, Ser 0.74; Hemoglobin 13.4; Platelets 348; Potassium 4.4; Sodium 140   Recent Lipid Panel Lab Results  Component Value Date/Time   CHOL 145 06/12/2021 09:09 AM   TRIG 52 06/12/2021 09:09 AM   HDL 71 06/12/2021 09:09 AM   CHOLHDL 2.0 06/12/2021 09:09 AM   CHOLHDL 2.9 08/13/2016 02:24 PM   LDLCALC 63 06/12/2021 09:09 AM    Wt Readings from Last 3 Encounters:  03/29/22 132 lb 9.6 oz (60.1 kg)  03/08/22 137 lb 9.6 oz (62.4 kg)  03/01/22 133 lb 3.2 oz (60.4 kg)     Objective:       03/29/2022    5:00 PM 03/29/2022    4:21 PM 03/08/2022    9:06 AM  Vitals with BMI  Height  5' 1.5"   Weight  132 lbs 10 oz 137 lbs 10 oz  BMI  65.78 46.96  Systolic 295 284 132  Diastolic 82 98 74  Pulse  108 74   GEN: Well nourished, well developed in no acute distress HEENT: Normal, moist mucous membranes NECK: No JVD CARDIAC: regular rhythm, normal S1 and S2, no rubs or gallops. No murmur. VASCULAR: Radial and DP pulses 2+ bilaterally. No carotid bruits RESPIRATORY:  Clear to auscultation without rales, wheezing or rhonchi  ABDOMEN: Soft, non-tender, non-distended MUSCULOSKELETAL:  Ambulates independently SKIN: Warm and dry, no edema NEUROLOGIC:  Alert and  oriented x 3. No focal neuro deficits noted. PSYCHIATRIC:  Normal affect    ASSESSMENT & PLAN:    Preop cardiovascular exam  Essential hypertension, benign  Hyperlipidemia, unspecified hyperlipidemia type  Cardiac risk counseling  Counseling on health promotion and disease prevention   Preoperative cardiovascular evaluation According to the Revised Cardiac Risk Index (RCRI), her Perioperative Risk of Major Cardiac Event is (%): 0.4  The patient is not currently having active cardiac symptoms, and they can achieve >4 METs of activity.  According to ACC/AHA Guidelines, no further testing is needed.  Proceed with surgery at acceptable risk.  Our service is available as needed in the peri-operative period.     Breast augmentation being done at [place], Dr. Erlinda Hong. Fax number [phone].  Hyperlipidemia: Former history of diabetes, no longer on insulin  -continue rosuvastatin  Hypertension: improved on recheck today, continue current regimen  Abnormal ECG: no high risk features.Prior with possible LAE. Declines ECG today.   CV risk factors and prevention -recommend heart healthy/Mediterranean diet, with whole grains, fruits, vegetable, fish, lean meats, nuts, and olive oil. Limit salt. -recommend moderate walking, 3-5 times/week for 30-50 minutes each session. Aim for at least 150 minutes.week. Goal should be pace of 3 miles/hours, or walking 1.5 miles in 30 minutes -recommend avoidance of tobacco products. Avoid excess alcohol. -ASCVD risk score: The 10-year ASCVD risk score (Arnett DK, et al., 2019) is: 6.2%   Values used to calculate the score:     Age: 63 years     Sex: Female     Is Non-Hispanic African American: Yes     Diabetic: Yes     Tobacco smoker: No     Systolic Blood Pressure: 683 mmHg     Is BP treated: Yes     HDL Cholesterol: 71 mg/dL     Total Cholesterol: 145 mg/dL   Follow up: 2 years, per patient preference. PRN also reasonable.  Buford Dresser, MD, PhD, [facility] at [facility] at [facility] [address] [place], [place] [address] [phone]    Medication Adjustments/Labs and Tests Ordered: Current medicines are reviewed at length with the patient today.  Concerns regarding medicines are outlined above.   Tests Ordered: No orders of the defined types were placed in this encounter.   Medication Changes: No orders of the defined types were placed in this encounter.  Patient Instructions  Medication Instructions:  No changes  *If you need a refill on your cardiac medications before your next appointment, please call your pharmacy*  Follow-Up: At [facility], you and your health needs are our priority.  As part of our continuing mission to provide you with exceptional heart care, we have created designated Provider Care Teams.  These Care Teams include your primary Cardiologist (physician) and Advanced Practice Providers (APPs -  Physician Assistants and Nurse Practitioners) who all work together to provide you with the care you need, when you need it.  We recommend signing up for the patient portal called "MyChart".  Sign up information is provided on this After Visit Summary.  MyChart is used to connect with patients for Virtual Visits (Telemedicine).  Patients are able to view lab/test results, encounter notes, upcoming appointments, etc.  Non-urgent messages can be sent to your provider as well.   To learn more about what you can do with MyChart, go to [email or web address].    Your next appointment:   2 year(s)  The format for your next appointment:   In Person  Provider:   Buford Dresser, MD    Other Instructions Please call our office with the fax number for your surgeon's office so we can send your clearance information. Thank you!  Important Information About  Sugar        Signed, Buford Dresser, MD  03/30/2022 9:41 AM    [place]

## 2022-03-30 ENCOUNTER — Encounter (HOSPITAL_BASED_OUTPATIENT_CLINIC_OR_DEPARTMENT_OTHER): Payer: Self-pay | Admitting: Cardiology

## 2022-03-30 ENCOUNTER — Telehealth (HOSPITAL_BASED_OUTPATIENT_CLINIC_OR_DEPARTMENT_OTHER): Payer: Self-pay

## 2022-03-30 NOTE — Telephone Encounter (Signed)
Pre-Op office note e-faxed to Dr. Wonda Cheng office [phone] ATTN: [facility]

## 2022-04-03 ENCOUNTER — Telehealth: Payer: Self-pay | Admitting: Family Medicine

## 2022-04-03 NOTE — Telephone Encounter (Signed)
After-hours/emergency line call  Patient reports she developed right-sided jaw and right shoulder tingling described as "pins-and-needles" sensation that started last night.  Notably she had laser eye surgery yesterday.  She did not require anesthesia but her eye was numbed.  Also has been feeling fatigued since surgery yesterday.  Denies arm/leg weakness, speech change, facial droop, chest pain, shortness of breath.  Symptoms have been unchanged since yesterday evening, does not sound like ACS or CVA so do not feel she needs emergent evaluation at this time.  Question if symptoms are somehow related to procedure yesterday or stress.  Attempted to schedule patient a appointment tomorrow but unfortunately no availability.  I informed patient that I will let our RN team know to give her a call tomorrow if there are any cancellations or ability to fit her in for appointment.  I encouraged patient to give [name] a call in the morning as well.  ED precautions given, advised to present to the ED if developing chest pain, shortness of breath, arm/leg weakness, facial droop or speech changes.

## 2022-04-04 ENCOUNTER — Ambulatory Visit (INDEPENDENT_AMBULATORY_CARE_PROVIDER_SITE_OTHER): Payer: 59 | Admitting: Family Medicine

## 2022-04-04 ENCOUNTER — Encounter: Payer: Self-pay | Admitting: Family Medicine

## 2022-04-04 VITALS — BP 108/68 | HR 88 | Wt 134.0 lb

## 2022-04-04 DIAGNOSIS — R202 Paresthesia of skin: Secondary | ICD-10-CM | POA: Diagnosis not present

## 2022-04-04 DIAGNOSIS — R200 Anesthesia of skin: Secondary | ICD-10-CM | POA: Diagnosis not present

## 2022-04-04 NOTE — Progress Notes (Signed)
    SUBJECTIVE:   CHIEF COMPLAINT / HPI:   Facial tingling and pruritus -Patient just had some sort of right-sided laser eye surgery on 7/31 with Dr. Katy Fitch of ophthalmology (operative notes not yet scanned into Epic chart) -After her operation, she does report right-sided facial swelling and right eye blurriness - Yesterday patient reports she had new onset tingling, numbness, and pruritus of right cheek, neck, and superior shoulder - She also believes that yesterday walking out to her car her right leg felt "heavy" - Denies double vision, difficulty speaking, facial droop, difficulty writing, decreased grip strength, or difficulty raising right arm -This tingling and numbness has partially improved, but she still has tenderness to palpation over right cheek  PERTINENT  PMH / PSH: HTN, T2DM, PCOS, HLD, ADHD, AUB, postconcussion syndrome, IBS  OBJECTIVE:   BP 108/68   Pulse 88   Wt 134 lb (60.8 kg)   SpO2 99%   BMI 24.91 kg/m    Physical Exam General: Awake, alert, oriented HEENT: PERRL, bilateral TM pearly pink and flat, bilateral external auditory canals with minimal cerumen burden, no lesions, nasal mucosa slightly edematous, oral mucosa pink, moist, without lesion, intact dentition without obvious cavity Lymph: No palpable lymphedema of head or neck Cardiovascular: Regular rate and rhythm, S1 and S2 present, no murmurs auscultated Respiratory: Lung fields clear to auscultation bilaterally Extremities: Moving all spontaneously and equally Neuro: Symmetric eyebrow lift, forced eye closure, cheek puff, and smile. Uvula midline.  5/5 strength equal bilaterally in all-shoulder shrug, shoulder abduction, BUE, grip strength.  Sensation intact and equal bilaterally of face, neck, BUE.  ASSESSMENT/PLAN:   Numbness and tingling of right side of face Acute, partially resolved.  Patient reports started over right face, neck, and shoulder a day or so after right eye laser surgery with Dr.  Katy Fitch, ophthalmology.  Neurological exam unremarkable.  Do not believe this is TIA, stroke, or shingles precursor.  Reassured patient, recommended she reach out to ophthalmology given this is so close to laser eye surgery.  Return precautions given.  If recurs or does not improve, could consider imaging to rule out MS.    Ezequiel Essex, MD [place]

## 2022-04-04 NOTE — Telephone Encounter (Signed)
Attempted to call patient. No answer, LVM asking patient to return call to office to schedule appointment.   Talbot Grumbling, RN

## 2022-04-04 NOTE — Assessment & Plan Note (Signed)
Acute, partially resolved.  Patient reports started over right face, neck, and shoulder a day or so after right eye laser surgery with Dr. Katy Fitch, ophthalmology.  Neurological exam unremarkable.  Do not believe this is TIA, stroke, or shingles precursor.  Reassured patient, recommended she reach out to ophthalmology given this is so close to laser eye surgery.  Return precautions given.  If recurs or does not improve, could consider imaging to rule out MS.

## 2022-04-04 NOTE — Patient Instructions (Signed)
It was wonderful to meet you today. Thank you for allowing me to be a part of your care. Below is a short summary of what we discussed at your visit today:  Facial numbness and tingling I do not believe this is a stroke or "mini stroke" TIA. It could be a precursor to shingles, although it is most likely related to swelling and tissue recovery after your laser surgery.   Please reach out to Dr. Katy Fitch, your eye doctor to get further guidance.     Please bring all of your medications to every appointment!  If you have any questions or concerns, please do not hesitate to contact us via phone or MyChart message.   Ezequiel Essex, MD

## 2022-04-20 ENCOUNTER — Telehealth (HOSPITAL_COMMUNITY): Payer: Self-pay | Admitting: Psychiatry

## 2022-04-20 ENCOUNTER — Other Ambulatory Visit (HOSPITAL_COMMUNITY): Payer: Self-pay | Admitting: Psychiatry

## 2022-04-20 DIAGNOSIS — F900 Attention-deficit hyperactivity disorder, predominantly inattentive type: Secondary | ICD-10-CM

## 2022-04-20 MED ORDER — LISDEXAMFETAMINE DIMESYLATE 70 MG PO CAPS: 70.0000 mg | ORAL_CAPSULE | ORAL | 0 refills | Status: DC

## 2022-04-20 NOTE — Telephone Encounter (Signed)
Medication refilled and sent to preferred pharmacy

## 2022-04-23 ENCOUNTER — Telehealth (INDEPENDENT_AMBULATORY_CARE_PROVIDER_SITE_OTHER): Payer: 59 | Admitting: Psychiatry

## 2022-04-23 ENCOUNTER — Encounter (HOSPITAL_COMMUNITY): Payer: Self-pay | Admitting: Psychiatry

## 2022-04-23 ENCOUNTER — Telehealth (HOSPITAL_COMMUNITY): Payer: Self-pay | Admitting: Psychiatry

## 2022-04-23 DIAGNOSIS — F900 Attention-deficit hyperactivity disorder, predominantly inattentive type: Secondary | ICD-10-CM

## 2022-04-23 MED ORDER — AMPHETAMINE-DEXTROAMPHET ER 15 MG PO CP24: 30.0000 mg | ORAL_CAPSULE | Freq: Every day | ORAL | 0 refills | Status: DC

## 2022-04-23 NOTE — Telephone Encounter (Signed)
Patient informed that she has been out of her Vyvanse for over a month due to it being on backorder.  Provider called pharmacy to confirm and was informed that patient has not picked up her Vyvanse since May 2023.  She informed Probation officer that she is having difficulties concentrating at work and is suffering with symptoms of ADHD.  Patient request to be started on another medication to help manage her symptoms.  Patient was informed to prescribe Adderall and is agreeable to restarting Adderall 15 mg twice daily to help manage symptoms.Potential side effects of medication and risks vs benefits of treatment vs non-treatment were explained and discussed. All questions were answered.  Patient now has insurance and was instructed to follow-up with the [place] office for continued care.  She endorsed understanding and agreed.  No other concerns noted at this time.

## 2022-04-23 NOTE — Progress Notes (Signed)
[place] [facility] MD/PA/NP OP Progress Note Virtual Visit via Telephone Note  I connected with Tracy Weiss on 04/23/22 at  2:30 PM EDT by telephone and verified that I am speaking with the correct person using two identifiers.  Location: Patient: Work Provider: Clinic   I discussed the limitations, risks, security and privacy concerns of performing an evaluation and management service by telephone and the availability of in person appointments. I also discussed with the patient that there may be a patient responsible charge related to this service. The patient expressed understanding and agreed to proceed.   I provided 30 minutes of non-face-to-face time during this encounter.  04/23/2022 2:27 PM Tracy Weiss  MRN:  [id number]  Chief Complaint: "I have not had my medications in awhile she while"  HPI: 56 year old female seen today for follow up psychiatric evaluation. She has a psychiatric history of anxiety and ADHD. She is currently managed on Vyvanse 70 mg daily however notes that she has been unable to obtain her medication due to it being on shortage.  Today she was unable to login virtually so her assessment was done over the phone. During exam she was pleasant, cooperative, and engaged in conversation. She informed Probation officer that she has not had her medications in a few months due to the [employer].  Return patient notes that she is suffering at work due to her inattentiveness.  She reports being forgetful, disorganized, having poor listening skills, and reports avoiding things that are mentally taxing.  Provider called pharmacy to confirm patient had not received Vyvanse.  Provider was informed that she has not picked up her medication since May.  Patient reports that she has tried all Adderall in the past and would like to restart it if possible.  Provider was agreeable.   Patient reports that her anxiety and depression are well managed.  Provider conducted a GAD-7 and patient scored a  1.  Provider also conducted PHQ-9 patient scored a 0.  She endorses adequate sleep and appetite.  Today she denies SI/HI/VAH, mania, paranoia.   Today Vyvanse discontinued.  Patient agreeable to starting Adderall XR 15 mg twice daily. Potential side effects of medication and risks vs benefits of treatment vs non-treatment were explained and discussed. All questions were answered.  Patient now has medical insurance and will be following up with [place] office for further management of medications.  No other concerns at this time.  Visit Diagnosis:    ICD-10-CM   1. Attention deficit hyperactivity disorder (ADHD), predominantly inattentive type  F90.0 amphetamine-dextroamphetamine (ADDERALL XR) 15 MG 24 hr capsule        Past Psychiatric History: ADHD and anxiety  Past Medical History:  Past Medical History:  Diagnosis Date   Abnormal uterine bleeding (AUB)    Acute cough 03/08/2022   ADHD    Allergy    Anxiety    Bacterial vaginosis 11/07/2010   Cholecystitis 09/10/2016   Diabetes mellitus without complication ([place])    type 2   Dysuria 03/31/2013   Gallstones 09/2016   GERD (gastroesophageal reflux disease)    after gallbladder removed   Hip pain, acute 03/01/2020   Hyperlipidemia    per pt "taking as preventative"   IBS (irritable bowel syndrome)    Incontinence 10/08/2019   Post-viral cough syndrome 03/06/2021   Postconcussion syndrome 12/01/2020   Rhinitis 03/08/2022   Sore throat 03/08/2022   Toe injury 08/17/2019   Vaginal discharge 01/23/2018   Viral illness 02/06/2021  Past Surgical History:  Procedure Laterality Date   CESAREAN SECTION     CHOLECYSTECTOMY N/A 09/11/2016   Procedure: LAPAROSCOPIC CHOLECYSTECTOMY WITH INTRAOPERATIVE CHOLANGIOGRAM;  Surgeon: Donnie Mesa, MD;  Location: [facility];  Service: General;  Laterality: N/A;   DILATATION AND CURETTAGE/HYSTEROSCOPY WITH MINERVA N/A 02/04/2019   Procedure: DILATATION AND CURETTAGE/ABLATION WITH MINERVA;  Surgeon: Emily Filbert, MD;   Location: [facility];  Service: Gynecology;  Laterality: N/A;  MINERVA ABLATION   IR RADIOLOGIST EVAL & MGMT  04/07/2020   tubal ligaton reversal  2017    Family Psychiatric History:  Family hx of ADHD in brothers, daughters and grandson    Family History:  Family History  Problem Relation Age of Onset   Cancer Mother        Metastatic with Unknown Primary; Stomach was involved   Diabetes Father    Heart disease Father    Breast cancer Neg Hx    Colon cancer Neg Hx    Esophageal cancer Neg Hx    Rectal cancer Neg Hx    Stomach cancer Neg Hx     Social History:  Social History   Socioeconomic History   Marital status: Single    Spouse name: Not on file   Number of children: Not on file   Years of education: Not on file   Highest education level: Master's degree (e.g., MA, MS, MEng, MEd, MSW, MBA)  Occupational History   Not on file  Tobacco Use   Smoking status: Never   Smokeless tobacco: Never  Vaping Use   Vaping Use: Never used  Substance and Sexual Activity   Alcohol use: Not Currently   Drug use: No   Sexual activity: Not Currently    Partners: Male    Birth control/protection: Abstinence  Other Topics Concern   Not on file  Social History Narrative   Not on file   Social Determinants of Health   Financial Resource Strain: Not on file  Food Insecurity: No Food Insecurity (07/13/2020)   Hunger Vital Sign    Worried About Running Out of Food in the Last Year: Never true    Ran Out of Food in the Last Year: Never true  Transportation Needs: No Transportation Needs (07/13/2020)   PRAPARE - Hydrologist (Medical): No    Lack of Transportation (Non-Medical): No  Physical Activity: Not on file  Stress: Not on file  Social Connections: Not on file    Allergies: No Known Allergies  Metabolic Disorder Labs: Lab Results  Component Value Date   HGBA1C 5.3 03/01/2022   Lab Results  Component Value Date    PROLACTIN 11.5 10/07/2018   PROLACTIN 10.3 06/11/2012   Lab Results  Component Value Date   CHOL 145 06/12/2021   TRIG 52 06/12/2021   HDL 71 06/12/2021   CHOLHDL 2.0 06/12/2021   VLDL 26 08/13/2016   LDLCALC 63 06/12/2021   LDLCALC 119 (H) 12/22/2019   Lab Results  Component Value Date   TSH 0.694 10/07/2018   TSH 1.080 09/23/2017    Therapeutic Level Labs: No results found for: "LITHIUM" No results found for: "VALPROATE" No results found for: "CBMZ"  Current Medications: Current Outpatient Medications  Medication Sig Dispense Refill   amphetamine-dextroamphetamine (ADDERALL XR) 15 MG 24 hr capsule Take 2 capsules by mouth daily. 60 capsule 0   olmesartan-hydrochlorothiazide (BENICAR HCT) 40-25 MG tablet TAKE ONE TABLET BY MOUTH DAILY (Patient not taking: Reported on 03/29/2022) 60  tablet 0   Prenatal Vit-Fe Fumarate-FA (PRENATAL VITAMIN PO) Take 1 tablet by mouth.      RESTASIS 0.05 % ophthalmic emulsion 1 drop 2 (two) times daily.     rosuvastatin (CRESTOR) 20 MG tablet TAKE ONE TABLET BY MOUTH DAILY (Patient not taking: Reported on 03/29/2022) 90 tablet 1   tirzepatide (MOUNJARO) 12.5 MG/0.5ML Pen Inject 12.5 mg into the skin once a week. 2 mL 3   No current facility-administered medications for this visit.     Musculoskeletal: Strength & Muscle Tone:  Unable to assess due to telphone visit [place]:  Unable to assess due to telphone visit Patient leans: N/A  Psychiatric Specialty Exam: Review of Systems  There were no vitals taken for this visit.There is no height or weight on file to calculate BMI.  General Appearance:  Unable to assess due to telphone visit  Eye Contact:   Unable to assess due to telphone visit  Speech:  Clear and Coherent and Normal Rate  Volume:  Normal  Mood:  Euthymic  Affect:  Appropriate and Congruent  Thought Process:  Coherent, Goal Directed, and Linear  Orientation:  Full (Time, Place, and Person)  Thought Content: WDL and  Logical   Suicidal Thoughts:  No  Homicidal Thoughts:  No  Memory:  Immediate;   Good Recent;   Good Remote;   Good  Judgement:  Good  Insight:  Good  Psychomotor Activity:  Normal  Concentration:  Concentration: Fair and Attention Span: Fair  Recall:  Good  Fund of Knowledge: Good  Language: Good  Akathisia:   Unable to assess due to telphone visit  Handed:  Right  AIMS (if indicated): not done  Assets:  Communication Skills Desire for Improvement Housing Physical Health Social Support  ADL's:  Intact  Cognition: WNL  Sleep:  Good   Screenings: GAD-7    Flowsheet Row Video Visit from 04/23/2022 in [facility] Video Visit from 05/26/2021 in [facility] Office Visit from 04/12/2021 in Center for [place] [facility] Video Visit from 02/28/2021 in [facility] Office Visit from 07/13/2020 in Center for [place] at [facility]  Total GAD-7 Score 0 '7 5 2 1      '$ PHQ2-9    Flowsheet Row Video Visit from 04/23/2022 in [facility] Office Visit from 03/08/2022 in [place] Office Visit from 06/12/2021 in [facility] Video Visit from 05/26/2021 in [facility] Office Visit from 04/12/2021 in Center for [place] at [facility]  PHQ-2 Total Score 0 0 '4 2 2  '$ PHQ-9 Total Score 0 '1 12 9 8      '$ Flowsheet Row ED from 10/21/2021 in [facility] Video Visit from 02/28/2021 in [facility] No Risk No Risk        Assessment and Plan: Patient has been without Vyvase for a few months due to the national shortage. Today she is agreeable to restarting Adderall 15 mg twice daily to help manage ADHD.  She will follow-up with the [place] office for further  evaluation and medication management.    1. Attention deficit hyperactivity disorder (ADHD), predominantly inattentive type  Start- amphetamine-dextroamphetamine (ADDERALL XR) 15 MG 24 hr capsule; Take 2 capsules by mouth daily.  Dispense: 60 capsule; Refill: 0  Salley Slaughter, NP 04/23/2022, 2:27 PM

## 2022-04-23 NOTE — Telephone Encounter (Signed)
Patient called stated that she needs to speak with her provider... it was explained to patient that she would need to be transfered toour other office that can bill her insurance after the next appt. pt is upset about this & would like Tracy Weiss to give her a call to discuss the matter. Please contact Patient @ phone number on file. Thanks

## 2022-04-25 ENCOUNTER — Telehealth (HOSPITAL_COMMUNITY): Payer: Self-pay | Admitting: Psychiatry

## 2022-04-25 ENCOUNTER — Other Ambulatory Visit (HOSPITAL_COMMUNITY): Payer: Self-pay | Admitting: Psychiatry

## 2022-04-25 DIAGNOSIS — F900 Attention-deficit hyperactivity disorder, predominantly inattentive type: Secondary | ICD-10-CM

## 2022-04-25 MED ORDER — AMPHETAMINE-DEXTROAMPHET ER 15 MG PO CP24: 30.0000 mg | ORAL_CAPSULE | Freq: Every day | ORAL | 0 refills | Status: DC

## 2022-04-25 NOTE — Telephone Encounter (Signed)
Medication refilled and sent to [facility] on [address].

## 2022-05-16 ENCOUNTER — Ambulatory Visit (HOSPITAL_BASED_OUTPATIENT_CLINIC_OR_DEPARTMENT_OTHER): Payer: 59 | Admitting: Student in an Organized Health Care Education/Training Program

## 2022-05-16 ENCOUNTER — Encounter (HOSPITAL_COMMUNITY): Payer: Self-pay | Admitting: Student in an Organized Health Care Education/Training Program

## 2022-05-16 VITALS — BP 142/93 | HR 77 | Wt 142.0 lb

## 2022-05-16 DIAGNOSIS — S069X1S Unspecified intracranial injury with loss of consciousness of 30 minutes or less, sequela: Secondary | ICD-10-CM

## 2022-05-16 DIAGNOSIS — F900 Attention-deficit hyperactivity disorder, predominantly inattentive type: Secondary | ICD-10-CM

## 2022-05-16 DIAGNOSIS — S069XAA Unspecified intracranial injury with loss of consciousness status unknown, initial encounter: Secondary | ICD-10-CM | POA: Insufficient documentation

## 2022-05-16 MED ORDER — LISDEXAMFETAMINE DIMESYLATE 60 MG PO CAPS: 60.0000 mg | ORAL_CAPSULE | ORAL | 0 refills | Status: DC

## 2022-05-16 NOTE — Progress Notes (Addendum)
[facility] MD/PA/NP OP Progress Note  05/16/2022 2:54 PM Tracy Weiss  MRN:  [id number]  Chief Complaint:  Chief Complaint  Patient presents with   ADD   HPI:   Tracy "Weiss" Posch is a 56 yo patient w/ PPH of ADHD, inattentive type who is transferring care due to acquiring insurance. Patient was previously seen by Burt Ek, [place].   On assessment today patient reports that she does not feel that the Aderrall XR '15mg'$  BID is optimal treatment for her symptoms. Patient reports that she has seen some improvements. Patient reports that she feels that her Vyvanse was "a better fit for me." Patient reports that she does still struggle with inattention and is not as productive. Patient endorses that she is sleeping and eating well and denied mood abnormalities. Patient denies SI, HI, and AVH. Patient reports that her job is very time consuming.   Objectively, patient is very concerned with making sure she is able to advocate for herself and endorses concern about lack of medication in the community as well as her recent provider change. Ultimately it was decided that patient would likely have better outcome and was transferred to Attending Provider who came to speak with patient in person about concerns and dx.   Patient endorsed that she also has a recent hx of TBI where she did have LOC and is still following with Neuropsych although thus far Head imaging has not been overtly concerning .   Visit Diagnosis:    ICD-10-CM   1. Attention deficit hyperactivity disorder (ADHD), predominantly inattentive type  F90.0 lisdexamfetamine (VYVANSE) 60 MG capsule    2. Traumatic brain injury, with loss of consciousness of 30 minutes or less, sequela ([place])  S06.9X1S       Past Psychiatric History: ADHD, inattentive type   Ritalin in college- failed  Past Medical History:  Past Medical History:  Diagnosis Date   Abnormal uterine bleeding (AUB)    Acute cough 03/08/2022   ADHD    Allergy     Anxiety    Bacterial vaginosis 11/07/2010   Cholecystitis 09/10/2016   Diabetes mellitus without complication ([place])    type 2   Dysuria 03/31/2013   Gallstones 09/2016   GERD (gastroesophageal reflux disease)    after gallbladder removed   Hip pain, acute 03/01/2020   Hyperlipidemia    per pt "taking as preventative"   IBS (irritable bowel syndrome)    Incontinence 10/08/2019   Post-viral cough syndrome 03/06/2021   Postconcussion syndrome 12/01/2020   Rhinitis 03/08/2022   Sore throat 03/08/2022   Toe injury 08/17/2019   Vaginal discharge 01/23/2018   Viral illness 02/06/2021    Past Surgical History:  Procedure Laterality Date   CESAREAN SECTION     CHOLECYSTECTOMY N/A 09/11/2016   Procedure: LAPAROSCOPIC CHOLECYSTECTOMY WITH INTRAOPERATIVE CHOLANGIOGRAM;  Surgeon: Donnie Mesa, MD;  Location: [facility];  Service: General;  Laterality: N/A;   DILATATION AND CURETTAGE/HYSTEROSCOPY WITH MINERVA N/A 02/04/2019   Procedure: DILATATION AND CURETTAGE/ABLATION WITH MINERVA;  Surgeon: Emily Filbert, MD;  Location: [facility];  Service: Gynecology;  Laterality: N/A;  MINERVA ABLATION   IR RADIOLOGIST EVAL & MGMT  04/07/2020   tubal ligaton reversal  2017    Family Psychiatric History:  Family hx of ADHD in brothers, daughters and grandson  Family History:  Family History  Problem Relation Age of Onset   Cancer Mother        Metastatic with Unknown Primary; Stomach was involved  Diabetes Father    Heart disease Father    Breast cancer Neg Hx    Colon cancer Neg Hx    Esophageal cancer Neg Hx    Rectal cancer Neg Hx    Stomach cancer Neg Hx     Social History:  Social History   Socioeconomic History   Marital status: Single    Spouse name: Not on file   Number of children: Not on file   Years of education: Not on file   Highest education level: Master's degree (e.g., MA, MS, MEng, MEd, MSW, MBA)  Occupational History   Not on file  Tobacco Use   Smoking status: Never    Smokeless tobacco: Never  Vaping Use   Vaping Use: Never used  Substance and Sexual Activity   Alcohol use: Not Currently   Drug use: No   Sexual activity: Not Currently    Partners: Male    Birth control/protection: Abstinence  Other Topics Concern   Not on file  Social History Narrative   Not on file   Social Determinants of Health   Financial Resource Strain: Not on file  Food Insecurity: No Food Insecurity (07/13/2020)   Hunger Vital Sign    Worried About Running Out of Food in the Last Year: Never true    Ran Out of Food in the Last Year: Never true  Transportation Needs: No Transportation Needs (07/13/2020)   PRAPARE - Hydrologist (Medical): No    Lack of Transportation (Non-Medical): No  Physical Activity: Not on file  Stress: Not on file  Social Connections: Not on file    Allergies: No Known Allergies  Metabolic Disorder Labs: Lab Results  Component Value Date   HGBA1C 5.3 03/01/2022   Lab Results  Component Value Date   PROLACTIN 11.5 10/07/2018   PROLACTIN 10.3 06/11/2012   Lab Results  Component Value Date   CHOL 145 06/12/2021   TRIG 52 06/12/2021   HDL 71 06/12/2021   CHOLHDL 2.0 06/12/2021   VLDL 26 08/13/2016   LDLCALC 63 06/12/2021   LDLCALC 119 (H) 12/22/2019   Lab Results  Component Value Date   TSH 0.694 10/07/2018   TSH 1.080 09/23/2017    Therapeutic Level Labs: No results found for: "LITHIUM" No results found for: "VALPROATE" No results found for: "CBMZ"  Current Medications: Current Outpatient Medications  Medication Sig Dispense Refill   lisdexamfetamine (VYVANSE) 60 MG capsule Take 1 capsule (60 mg total) by mouth every morning. 60 capsule 0   olmesartan-hydrochlorothiazide (BENICAR HCT) 40-25 MG tablet TAKE ONE TABLET BY MOUTH DAILY (Patient not taking: Reported on 03/29/2022) 60 tablet 0   Prenatal Vit-Fe Fumarate-FA (PRENATAL VITAMIN PO) Take 1 tablet by mouth.      RESTASIS 0.05 % ophthalmic  emulsion 1 drop 2 (two) times daily.     rosuvastatin (CRESTOR) 20 MG tablet TAKE ONE TABLET BY MOUTH DAILY (Patient not taking: Reported on 03/29/2022) 90 tablet 1   tirzepatide (MOUNJARO) 12.5 MG/0.5ML Pen Inject 12.5 mg into the skin once a week. 2 mL 3   No current facility-administered medications for this visit.     Musculoskeletal: Strength & Muscle Tone: within normal limits Gait & Station: normal Patient leans: N/A  Psychiatric Specialty Exam: Review of Systems  Psychiatric/Behavioral:  Negative for dysphoric mood, hallucinations, sleep disturbance and suicidal ideas.     Blood pressure (!) 142/93, pulse 77, weight 142 lb (64.4 kg), SpO2 99 %.Body mass index is 26.4  kg/m.  General Appearance: Casual and Well Groomed  Eye Contact:  Good  Speech:  Clear and Coherent  Volume:  Normal  Mood:  Euthymic  Affect:  Congruent  Thought Process:  Linear  Orientation:  Full (Time, Place, and Person)  Thought Content: Logical   Suicidal Thoughts:  No  Homicidal Thoughts:  No  Memory:  Immediate;   Good Recent;   Good  Judgement:  Fair  Insight:  Fair  Psychomotor Activity:  Normal  Concentration:  Concentration: Fair  Recall:  [place] of Knowledge: Good  Language: Good  Akathisia:  No  Handed:    AIMS (if indicated): not done  Assets:  Agricultural consultant Housing Resilience  ADL's:  Intact  Cognition: WNL  Sleep:  Good   Screenings: GAD-7    Flowsheet Row Video Visit from 04/23/2022 in [facility] Video Visit from 05/26/2021 in [facility] Office Visit from 04/12/2021 in Center for [place] [facility] Video Visit from 02/28/2021 in [facility] Office Visit from 07/13/2020 in [place] for [place] [facility]  Total GAD-7 Score 0 '7 5 2 1      '$ PHQ2-9    Flowsheet Row Video Visit from  04/23/2022 in [facility] Office Visit from 03/08/2022 in [place] Office Visit from 06/12/2021 in [place] Video Visit from 05/26/2021 in [facility] Office Visit from 04/12/2021 in [place] for [place] [facility]  PHQ-2 Total Score 0 0 '4 2 2  '$ PHQ-9 Total Score 0 '1 12 9 8      '$ Flowsheet Row ED from 10/21/2021 in [place] Video Visit from 02/28/2021 in [place] No Risk No Risk        Assessment and Plan: Virlee "Weiss" Skarda is a 56 yo patient w/ PPH of ADHD, inattentive type who is transferring care due to acquiring insurance. On assessment today patient endorsed preference for Vyvanse as she felt she functioned better and was more able to do her work and ADLs. Patient medication compliance had been negatively impacted by the stimulant shortage leading to change in medication however, provider was able to find a pharmacy with '60mg'$  Vyvanse and it was discussed with patient she would likely do better on this than how she felt on Adderall XR '15mg'$  BID. Patient enjoys spending most of session talking about her job and the time and energy it requires. Patient's hx of TBI and ADHD dx suggest that she likely does benefit from stimulants as medication of choice for tx of her inattentivness.  ADHD, inattentive type - Discontinue Adderall XR '15mg'$  BID  - Start Vyvanse '60mg'$  daily - Follow w/ [name] in approx 1 mon virtually  Collaboration of Care: Collaboration of Care:   Patient/Guardian was advised Release of Information must be obtained prior to any record release in order to collaborate their care with an outside provider. Patient/Guardian was advised if they have not already done so to contact the registration department to sign all necessary forms in order for [name] to release  information regarding their care.   Consent: Patient/Guardian gives verbal consent for treatment and assignment of benefits for services provided during this visit. Patient/Guardian expressed understanding and agreed to proceed.   PGY-3 Freida Busman, MD  05/16/2022, 2:54 PM

## 2022-05-21 ENCOUNTER — Telehealth (HOSPITAL_COMMUNITY): Payer: Self-pay | Admitting: *Deleted

## 2022-05-21 NOTE — Telephone Encounter (Signed)
Received a fax from pt pharmacy, Tracy Weiss on [address] that pt could only be given #30 of the Vyvanse 60 mg capsules as they are now out of stock. Please send #30 to [place] or [facility]. Thanks.

## 2022-05-22 NOTE — Telephone Encounter (Signed)
30 pills was all she was intended to have. She continues to ask for 90 pills, but 30 is what she is supposed to have

## 2022-05-23 NOTE — Telephone Encounter (Signed)
Got it.

## 2022-06-19 ENCOUNTER — Ambulatory Visit: Payer: 59 | Admitting: Psychology

## 2022-06-19 ENCOUNTER — Encounter: Payer: Self-pay | Admitting: Psychology

## 2022-06-19 ENCOUNTER — Ambulatory Visit: Payer: 59

## 2022-06-19 DIAGNOSIS — F411 Generalized anxiety disorder: Secondary | ICD-10-CM

## 2022-06-19 DIAGNOSIS — R41840 Attention and concentration deficit: Secondary | ICD-10-CM | POA: Diagnosis not present

## 2022-06-19 DIAGNOSIS — R41841 Cognitive communication deficit: Secondary | ICD-10-CM

## 2022-06-19 DIAGNOSIS — R4189 Other symptoms and signs involving cognitive functions and awareness: Secondary | ICD-10-CM

## 2022-06-19 DIAGNOSIS — S060X0S Concussion without loss of consciousness, sequela: Secondary | ICD-10-CM

## 2022-06-19 DIAGNOSIS — R413 Other amnesia: Secondary | ICD-10-CM

## 2022-06-19 DIAGNOSIS — F0781 Postconcussional syndrome: Secondary | ICD-10-CM | POA: Diagnosis not present

## 2022-06-19 NOTE — Progress Notes (Addendum)
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   Processing speed was average to well above average. Basic attention was average. More complex attention (e.g., working memory) was below average. She did not exhibit any atypical performances across a continuous performance task. Executive functioning was largely below average to average. She did exhibit more variability across a task assessing response inhibition.  While not directly assessed, receptive language abilities were believed to be intact. Likewise, Ms. Jaskiewicz did not exhibit any difficulties comprehending task instructions and answered all questions asked of her appropriately. Assessed expressive language was somewhat variable. Verbal fluency (i.e., both phonemic and semantic) were average while performance across a confrontation naming task was exceptionally low. However, of her very few incorrect responses across this task, 50% were due to  unfamiliarity with the object itself rather than the result of true word finding deficits.    Assessed visuospatial/visuoconstructional abilities were below average to average. Points were lost on her drawing of a clock due to incorrect hand placement.    Learning (i.e., encoding) of novel verbal and visual information was variable, ranging from the exceptionally low to average normative ranges. Spontaneous delayed recall (i.e., retrieval) of previously learned information was also variable, ranging from the exceptionally low to below average normative ranges. Retention rates were 97% across a story learning task, 0% across a list learning task, 71% across a daily living task, and 63% across a shape learning task. Performance across recognition tasks was average outside of a poor performance across a list learning task, suggesting some evidence for information consolidation.   Results of emotional screening instruments suggested that recent symptoms of generalized anxiety were in the minimal range, while symptoms of depression were within normal limits. She did not elevate any clinical subscales across a more comprehensive personality questionnaire. A screening instrument assessing recent sleep quality suggested the presence of minimal sleep dysfunction.  Tables of Scores:   Note: This summary of test scores accompanies the interpretive report and should not be considered in isolation without reference to the appropriate sections in the text. Descriptors are based on appropriate normative data and may be adjusted based on clinical judgment. Terms such as "Within Normal Limits" and "Outside Normal Limits" are used when a more specific description of the test score cannot be determined.       Percentile - Normative Descriptor > 98 - Exceptionally High 91-97 - Well Above Average 75-90 - Above Average 25-74 - Average 9-24 - Below Average 2-8 - Well Below Average < 2 - Exceptionally Low        Validity:   DESCRIPTOR       TOMM: --- --- Within Normal Limits  ACS Word Choice: --- --- Within Normal Limits  Dot Counting Test: --- --- Within Normal Limits  NAB EVI: --- --- Within Normal Limits       Orientation:      Raw Score Percentile   NAB Orientation, Form 1 29/29 --- ---       Cognitive Screening:      Raw Score Percentile   SLUMS: 18/30 --- ---       Intellectual Functioning:      Standard Score Percentile   Barona Formula Estimated Premorbid IQ:  96 39 Average        Standard Score Percentile   Test of Premorbid Functioning: 81 10 Below Average       Memory:     NAB Memory Module, Form 1: Standard Score/ T Score Percentile   List Learning       Total Trials 1-3 11/36 (19) <1 Exceptionally Low    List B 3/12 (34) 5 Well Below Average    Short Delay Free Recall 0/12 (19) <1 Exceptionally Low    Long Delay Free Recall 0/12 (19) <1 Exceptionally Low    Retention Percentage 0 (18) <1 Exceptionally Low    Recognition Discriminability 0 (23) <1 Exceptionally Low  Shape Learning       Total Trials 1-3 17/27 (47) 38 Average    Delayed Recall 5/9 (39) 14 Below Average    Retention Percentage 63 (36) 8 Well Below Average    Recognition Discriminability 7 (50) 50 Average  Story Learning       Immediate Recall 50/80 (32) 4 Well Below Average    Delayed Recall 29/40 (37) 9 Below Average    Retention Percentage 97 (53) 62 Average  Daily Living Memory       Immediate Recall 28/51 (23) <1 Exceptionally Low    Delayed Recall 10/17 (22) <1 Exceptionally Low    Retention Percentage 71 (32) 4 Well Below Average    Recognition Hits 9/10 (50) 50 Average       Attention/Executive Function:     Trail Making Test (TMT): Raw Score (T Score) Percentile     Part A 17 secs.,  0 errors (67) 96 Well Above Average    Part B 94 secs.,  0 errors (41) 18 Below Average         Scaled Score Percentile   WAIS-IV Coding: 11 63 Average       NAB Attention Module, Form 1: T Score  Percentile     Digits Forward 52 58 Average    Digits Backwards 38 12 Below Average       Conners CPT 3: T Score Percentile     d' 57 75 High Average    Omissions 57 75 High Average    Commissions 48 42 Average    Perseverations 59 82 High Average    HRT 51 54 Average    HRT SD 52 58 Average    Variability 57 75 High Average       D-KEFS Color-Word Interference Test: Raw Score (Scaled Score) Percentile     Color Naming 30 secs. (10) 50 Average    Word Reading 25 secs. (9) 37 Average    Inhibition 81 secs. (6) 9 Below Average      Total Errors 3 errors (8) 25 Average    Inhibition/Switching 122 secs. (2) <1 Exceptionally Low      Total Errors 2 errors (10) 50 Average       D-KEFS Verbal Fluency Test: Raw Score (Scaled Score) Percentile     Letter Total Correct 33 (9) 37 Average    Category Total Correct 35 (9) 37 Average    Category Switching Total Correct 13 (10) 50 Average    Category Switching Accuracy 11 (9) 37 Average      Total Set Loss Errors 3 (9) 37 Average      Total Repetition Errors 6 (7) 16 Below Average       Language:     NAB Language Module, Form 1: T Score Percentile  Naming 27/31 (25) 1 Exceptionally Low       Visuospatial/Visuoconstruction:      Raw Score Percentile   Clock Drawing: 8/10 --- Within Normal Limits       NAB Spatial Module, Form 1: T Score Percentile     Figure Drawing Copy 51 54 Average        Scaled Score Percentile   WAIS-IV Block Design: 6 9 Below Average       Mood and Personality:      Raw Score Percentile   Beck Depression Inventory - II: 4 --- Within Normal Limits  PROMIS Anxiety Questionnaire: 7 --- None to Slight       Personality Assessment Inventory: T Score  Percentile     Inconsistency 58 --- Within Normal Limits    Infrequency 40 --- Within Normal Limits    Negative Impression 55 --- Within Normal Limits    Positive Impression 52 --- Within Normal Limits    Somatic Complaints 46 --- Within Normal Limits     Anxiety 48 --- Within Normal Limits    Anxiety-Related Disorders 50 --- Within Normal Limits    Depression 51 --- Within Normal Limits    Mania 55 --- Within Normal Limits    Paranoia 52 --- Within Normal Limits    Schizophrenia 59 --- Within Normal Limits    Borderline Features 49 --- Within Normal Limits    Antisocial Features 42 --- Within Normal Limits    Alcohol Problems 41 --- Within Normal Limits    Drug Problems 54 --- Within Normal Limits    Aggression 40 --- Within Normal Limits    Suicidal Ideation 49 --- Within Normal Limits    Stress 46 --- Within Normal Limits    Non Support 39 --- Within Normal Limits    Treatment Rejection 53 --- Within Normal Limits    Dominance 67 --- Within Normal Limits    Warmth 63 --- Within Normal Limits       Additional Questionnaires:      Raw Score Percentile   PROMIS Sleep Disturbance Questionnaire: 24 --- None to Slight   Informed Consent and Coding/Compliance:   The current evaluation represents a clinical evaluation for the purposes previously outlined by the referral source and is in no way reflective of a forensic evaluation.   Ms. Bera was provided with a verbal description of the nature and purpose of the present neuropsychological evaluation. Also reviewed were the foreseeable risks and/or discomforts and benefits of the procedure, limits of confidentiality, and mandatory reporting requirements of this provider. The patient was given the opportunity to ask questions and receive answers about the evaluation. Oral consent to participate was provided by the patient.   This evaluation was conducted by Christia Reading, Ph.D., ABPP-CN, board certified clinical neuropsychologist. Ms. Cerveny completed a clinical interview with Dr. Melvyn Novas, billed as one unit [phone], and 190 minutes of cognitive testing and scoring, billed as one unit [phone] and five additional units [id number]. Psychometrist Cruzita Lederer, B.S., assisted Dr. Melvyn Novas with test administration  and scoring procedures. As a separate and discrete service, Dr. Melvyn Novas spent a total of 160 minutes in interpretation and report writing billed as one unit [phone] and two units 96133.

## 2022-06-19 NOTE — Progress Notes (Signed)
   Psychometrician Note   Cognitive testing was administered to [facility] by Cruzita Lederer, B.S. (psychometrist) under the supervision of Dr. Christia Reading, Ph.D., licensed psychologist on 06/19/2022. Ms. Ballinger did not appear overtly distressed by the testing session per behavioral observation or responses across self-report questionnaires. Rest breaks were offered.    The battery of tests administered was selected by Dr. Christia Reading, Ph.D. with consideration to Ms. Lusher's current level of functioning, the nature of her symptoms, emotional and behavioral responses during interview, level of literacy, observed level of motivation/effort, and the nature of the referral question. This battery was communicated to the psychometrist. Communication between Dr. Christia Reading, Ph.D. and the psychometrist was ongoing throughout the evaluation and Dr. Christia Reading, Ph.D. was immediately accessible at all times. Dr. Christia Reading, Ph.D. provided supervision to the psychometrist on the date of this service to the extent necessary to assure the quality of all services provided.    Melody Haver will return within approximately 1-2 weeks for an interactive feedback session with Dr. Melvyn Novas at which time her test performances, clinical impressions, and treatment recommendations will be reviewed in detail. Ms. Guier understands she can contact our office should she require our assistance before this time.  A total of 190 minutes of billable time were spent face-to-face with Ms. College by the psychometrist. This includes both test administration and scoring time. Billing for these services is reflected in the clinical report generated by Dr. Christia Reading, Ph.D.  This note reflects time spent with the psychometrician and does not include test scores or any clinical interpretations made by Dr. Melvyn Novas. The full report will follow in a separate note.

## 2022-06-26 ENCOUNTER — Ambulatory Visit (INDEPENDENT_AMBULATORY_CARE_PROVIDER_SITE_OTHER): Payer: 59 | Admitting: Psychology

## 2022-06-26 DIAGNOSIS — S060X0S Concussion without loss of consciousness, sequela: Secondary | ICD-10-CM | POA: Diagnosis not present

## 2022-06-26 DIAGNOSIS — F411 Generalized anxiety disorder: Secondary | ICD-10-CM

## 2022-06-26 DIAGNOSIS — R41840 Attention and concentration deficit: Secondary | ICD-10-CM | POA: Diagnosis not present

## 2022-06-26 NOTE — Progress Notes (Signed)
   Neuropsychology Feedback Session Tillie Rung. [facility]  Reason for Referral:   Tracy Weiss is a 56 y.o. right-handed African-American female referred by  [name], M.D. , to characterize her current cognitive functioning and assist with diagnostic clarity and treatment planning in the context of ADHD, underlying psychiatric comorbidities, and post-concussion syndrome.   Feedback:   Tracy Weiss completed a comprehensive neuropsychological evaluation on 06/19/2022. Please refer to that encounter for the full report and recommendations. Briefly, results suggested performance variability across response inhibition, as well as learning and memory (with a particular weakness surrounding verbal memory relative to visual memory). Overall, I agree with the results of her prior evaluation and believe that primary culprits for ongoing dysfunction continue to be her history of ADHD and underlying psychiatric distress (i.e., anxiety and likely undisclosed traumatic experiences). Ongoing sleep dysfunction (i.e., 4-5 hours nightly while not waking feeling rested) would also contribute. These experiences can reasonably account for performances across testing and all that Tracy Weiss described during interview regarding trouble with day-to-day functioning. I find it unlikely that her concussion would directly contribute to her current clinical presentation and there certainly is no evidence for permanent brain damage given unremarkable neuroimaging. In fact, she noted during interview that she was able to perform well across several licensure exams in the time immediately following this injury. Rather, this event more likely exacerbated symptoms and dysfunction which were already present and the subsequent prolonged recovery has been overwhelming and increasingly frustrating. Continued recovery and improvement over time is expected with proper adherence to treatment  recommendations.  Tracy Weiss was unaccompanied during the current feedback session. Content of the current session focused on the results of her neuropsychological evaluation. Tracy Weiss was given the opportunity to ask questions and her questions were answered. She was encouraged to reach out should additional questions arise. A copy of her report was provided at the conclusion of the visit. I also provided her a copy of her 2016 neuropsychological evaluation at her request.      30 minutes were spent conducting the current feedback session with Tracy Weiss, billed as one unit [phone].

## 2022-07-06 ENCOUNTER — Telehealth (HOSPITAL_COMMUNITY): Payer: Self-pay | Admitting: *Deleted

## 2022-07-06 DIAGNOSIS — F900 Attention-deficit hyperactivity disorder, predominantly inattentive type: Secondary | ICD-10-CM

## 2022-07-06 MED ORDER — LISDEXAMFETAMINE DIMESYLATE 60 MG PO CAPS: 60.0000 mg | ORAL_CAPSULE | ORAL | 0 refills | Status: DC

## 2022-07-06 NOTE — Telephone Encounter (Signed)
Pt presented to office to request a refill of Vyvanse 60 mg. Per [facility] on [address] they will only fill #30, script written for #60) which pt picked up on 05/16/22. See previous encounter. Pt requesting #30 refill be sent to [facility] pharmacy. Pt has an upcoming appointment on 07/17/22 with [name]. Please review and advise.

## 2022-07-06 NOTE — Telephone Encounter (Signed)
Writer spoke with pt several times regarding Vyvanse fill. Pt is upset and anxious about only getting #11 bridge to appointment on 07/17/22 with [name]. Writer did speak with [facility] twice, at pt request, to ensure that pt will be able to fill next script for Vyvanse after 07/17/22 appointment. Pt states that she works and doesn't have time to go back and forth and doesn't even know why she has an appointment on 07/17/22. Pt became tearful and irritable, upset, at the thought of not being able to fill prescription. Service recovery implemented. Pt states she will keep appointment. Pt encouraged to call with any further questions or concerns prior to appointment.

## 2022-07-09 ENCOUNTER — Other Ambulatory Visit: Payer: Self-pay | Admitting: Pharmacist

## 2022-07-09 DIAGNOSIS — E119 Type 2 diabetes mellitus without complications: Secondary | ICD-10-CM

## 2022-07-10 NOTE — Progress Notes (Unsigned)
   I, Peterson Lombard, LAT, ATC acting as a scribe for [name], MD.  Tracy Weiss is a 56 y.o. female who presents to [facility] today for f/u of post-concussion syndrome after suffering a concussion on 06/27/20 when she was struck in the head by a swing set that she was trying to disassemble.  She was last seen by Dr. [name] on 01/08/22 and was advised to let us know which anxiety medicine she had been taking in the past.  Patient was also advised to follow-up with [facility] neurology regarding the neuropsych evaluation.  Patient later had her neuropsych evaluation on 06/19/2022 and had a f/u visit on 06/26/22. Today, pt reports  Dx imaging: 10/10/20 Brain MRI  06/27/20 Head & maxillofacial CT  Pertinent review of systems: ***  Relevant historical information: ***   Exam:  There were no vitals taken for this visit. General: Well Developed, well nourished, and in no acute distress.   MSK: ***    Lab and Radiology Results No results found for this or any previous visit (from the past 72 hour(s)). No results found.     Assessment and Plan: 56 y.o. female with ***   PDMP not reviewed this encounter. No orders of the defined types were placed in this encounter.  No orders of the defined types were placed in this encounter.    Discussed warning signs or symptoms. Please see discharge instructions. Patient expresses understanding.   ***

## 2022-07-11 ENCOUNTER — Encounter: Payer: Self-pay | Admitting: Family Medicine

## 2022-07-11 ENCOUNTER — Ambulatory Visit (INDEPENDENT_AMBULATORY_CARE_PROVIDER_SITE_OTHER): Payer: 59 | Admitting: Family Medicine

## 2022-07-11 VITALS — BP 130/84 | HR 85 | Ht 61.5 in | Wt 135.0 lb

## 2022-07-11 DIAGNOSIS — F411 Generalized anxiety disorder: Secondary | ICD-10-CM

## 2022-07-11 DIAGNOSIS — F900 Attention-deficit hyperactivity disorder, predominantly inattentive type: Secondary | ICD-10-CM | POA: Diagnosis not present

## 2022-07-11 MED ORDER — FLUOXETINE HCL 10 MG PO CAPS: ORAL_CAPSULE | ORAL | 0 refills | Status: DC

## 2022-07-11 NOTE — Patient Instructions (Signed)
Thank you for coming in today.   Start prozac '10mg'$  daily for 1 week.  After 1 week increase prozac to '20mg'$   (2 pills).   Recheck with me in 3 weeks.   Let me know if you have a problem.   This is ok with vyvanse and is an old medicine we use a lot   We can use a short term medicine for anxiety (klonopin if needed).

## 2022-07-17 ENCOUNTER — Encounter (HOSPITAL_COMMUNITY): Payer: Self-pay | Admitting: Psychiatry

## 2022-07-17 ENCOUNTER — Telehealth (HOSPITAL_BASED_OUTPATIENT_CLINIC_OR_DEPARTMENT_OTHER): Payer: 59 | Admitting: Psychiatry

## 2022-07-17 DIAGNOSIS — F900 Attention-deficit hyperactivity disorder, predominantly inattentive type: Secondary | ICD-10-CM

## 2022-07-17 DIAGNOSIS — S069X1S Unspecified intracranial injury with loss of consciousness of 30 minutes or less, sequela: Secondary | ICD-10-CM | POA: Diagnosis not present

## 2022-07-17 DIAGNOSIS — F411 Generalized anxiety disorder: Secondary | ICD-10-CM

## 2022-07-17 MED ORDER — LISDEXAMFETAMINE DIMESYLATE 60 MG PO CAPS: 60.0000 mg | ORAL_CAPSULE | ORAL | 0 refills | Status: DC

## 2022-07-17 NOTE — Progress Notes (Signed)
BH MD/PA/NP OP Progress Note  07/17/2022 7:54 AM Honor Frison  MRN:  [id number]  Visit Diagnosis:    ICD-10-CM   1. Attention deficit hyperactivity disorder (ADHD), predominantly inattentive type  F90.0     2. Traumatic brain injury, with loss of consciousness of 30 minutes or less, sequela ([place])  S06.9X1S     3. Generalized anxiety disorder  F41.1       Assessment: Ileen "[name]" Stalling is a 56 yo patient w/ PPH of ADHD, inattentive type who is transferring care due to acquiring insurance.   On initial evaluation patient endorsed symptoms of poor attention, difficulty concentrating, poor time management, and poor organization which has had an impact on her work. She has had neuropsych testing in 2016 that confirmed a diagnosis of ADHD and notes an improvement in these symptoms when on a stimulant. Patient denies any adverse medication side effects. Of note patient also has a hx of TBI for which she follows neurology. Psychological testing from 06/2022 reported that any residual cognitive decline or decreased fxn is likely secondary to anxiety/ADHD symptoms and recommended patient connect with therapy in addition to starting medications.   Melody Haver presents for follow-up evaluation. Today, 07/17/22, patient reports control of her ADHD symptoms over the past month, though does endorse increased anxiety. Of note she was started on Prozac in the interim which she has not started yet. We discussed the risks and benefits of this medication along with the mechanism of action, as she had been thinking of taking it prn for anxiety. Explained that this would need to be taken daily to help manage her anxiety symptoms. Also expressed that if she were to start the medication then this prescriber would prefer if it is managed by her psychiatric team. Patient opted to hold off at this time and plans to talk with her Dr. [name].   Plan:  - Continue Vyvanse '60mg'$  daily - Hold Prozac 20 mg QD,  patient to discuss with her sports medicine specialist - CMP, CBC reviewed - EKG from 03/05/22 showed a QTC of 460 - Neuropsych testing from 2016 which dx ADHD was reviewed - Follow up in   Chief Complaint:  Chief Complaint  Patient presents with   Follow-up   Medication Refill   HPI: Debe Coder presents reporting that things have been more or less the same the past month. She had been taking her Vyvanse until she ran out and is frustrated with the recent change in providers. We discussed the reason for the change as well as the standard of care for this provider. This included following up once every couple months and every other appointment in person for continued stimulant management. We also discussed that importance of having her her psychiatric medications managed by one prescriber. This was primarily related to patient starting on Prozac for her anxiety recently. We discussed this medication and the benefits it could have for her symptoms. Patient reports that she had not started the medication yet and was planning to take it on a more PRN basis to manage her anxiety symptoms. We explained to the patient that this medication needs to be taken daily to build up in the body and take effect. It would not be an effective treatment for anxiety if taken on a PRN basis. [name] expressed hesitancy about this and would like to discuss it with her sports medicine specialist.    Also reviewed patients recent psychological testing which indicated that her current symptoms are likely related  primarily to her ADHD and underlying anxiety instead of being secondary to her TBI.   Past Psychiatric History: ADHD, inattentive type, GAD  Patient tried and failed Ritalin in college. She has been on Adderall in the past.   Past Medical History:  Past Medical History:  Diagnosis Date   Abnormal uterine bleeding 09/26/2018   Acute cough 03/08/2022   Allergy    Attention deficit hyperactivity disorder (ADHD),  predominantly inattentive type 10/31/2006   Bacterial vaginosis 11/07/2010   Cholecystitis 09/10/2016   Concussion 06/2020   Dysuria 03/31/2013   Essential hypertension, benign 07/07/2009   Fibroids, submucosal 07/13/2020   Gallstones 09/2016   Generalized anxiety disorder 03/30/2015   GERD (gastroesophageal reflux disease)    after gallbladder removed   Hip pain, acute 03/01/2020   History of reversal of tubal ligation 04/03/2016   Hyperlipidemia    per pt "taking as preventative"   IBS (irritable bowel syndrome)    Incontinence 10/08/2019   Left atrial enlargement 02/23/2019   Numbness and tingling of right side of face 03/26/2011   Obesity 04/13/2009   PCOS (polycystic ovarian syndrome) 06/25/2012   Post-viral cough syndrome 03/06/2021   Postconcussion syndrome 12/01/2020   Rhinitis 03/08/2022   Sore throat 03/08/2022   Toe injury 08/17/2019   Type 2 diabetes mellitus without complication, with long-term current use of insulin 12/17/2006   Vaginal discharge 01/23/2018   Viral illness 02/06/2021    Past Surgical History:  Procedure Laterality Date   CESAREAN SECTION     CHOLECYSTECTOMY N/A 09/11/2016   Procedure: LAPAROSCOPIC CHOLECYSTECTOMY WITH INTRAOPERATIVE CHOLANGIOGRAM;  Surgeon: Donnie Mesa, MD;  Location: [place];  Service: General;  Laterality: N/A;   DILATATION AND CURETTAGE/HYSTEROSCOPY WITH MINERVA N/A 02/04/2019   Procedure: DILATATION AND CURETTAGE/ABLATION WITH MINERVA;  Surgeon: Emily Filbert, MD;  Location: [place];  Service: Gynecology;  Laterality: N/A;  MINERVA ABLATION   IR RADIOLOGIST EVAL & MGMT  04/07/2020   tubal ligaton reversal  2017    Family Psychiatric History: Family hx of ADHD in brothers, daughters and grandson  Family History:  Family History  Problem Relation Age of Onset   Cancer Mother        Metastatic with Unknown Primary; Stomach was involved   Diabetes Father    Heart disease Father    Breast cancer Neg Hx     Colon cancer Neg Hx    Esophageal cancer Neg Hx    Rectal cancer Neg Hx    Stomach cancer Neg Hx     Social History:  Social History   Socioeconomic History   Marital status: Single    Spouse name: Not on file   Number of children: Not on file   Years of education: 18   Highest education level: Master's degree (e.g., MA, MS, MEng, MEd, MSW, MBA)  Occupational History   Occupation: Location manager  Tobacco Use   Smoking status: Never   Smokeless tobacco: Never  Vaping Use   Vaping Use: Never used  Substance and Sexual Activity   Alcohol use: Not Currently   Drug use: No   Sexual activity: Not Currently    Partners: Male    Birth control/protection: Abstinence  Other Topics Concern   Not on file  Social History Narrative   Not on file   Social Determinants of Health   Financial Resource Strain: Not on file  Food Insecurity: No Food Insecurity (07/13/2020)   Hunger Vital Sign    Worried About Running Out  of Food in the Last Year: Never true    [place] in the Last Year: Never true  Transportation Needs: No Transportation Needs (07/13/2020)   PRAPARE - Hydrologist (Medical): No    Lack of Transportation (Non-Medical): No  Physical Activity: Not on file  Stress: Not on file  Social Connections: Not on file    Allergies: No Known Allergies  Current Medications: Current Outpatient Medications  Medication Sig Dispense Refill   FLUoxetine (PROZAC) 10 MG capsule Take 1 capsule (10 mg total) by mouth daily for 7 days, THEN 2 capsules (20 mg total) daily for 21 days. 49 capsule 0   lisdexamfetamine (VYVANSE) 60 MG capsule Take 1 capsule (60 mg total) by mouth every morning. 11 capsule 0   MOUNJARO 12.5 MG/0.5ML Pen INJECT 12.'5MG'$  INTO THE SKIN ONCE WEEKLY 4 mL 0   olmesartan-hydrochlorothiazide (BENICAR HCT) 40-25 MG tablet TAKE ONE TABLET BY MOUTH DAILY (Patient not taking: Reported on 03/29/2022) 60 tablet 0   Prenatal Vit-Fe  Fumarate-FA (PRENATAL VITAMIN PO) Take 1 tablet by mouth.      RESTASIS 0.05 % ophthalmic emulsion 1 drop 2 (two) times daily.     rosuvastatin (CRESTOR) 20 MG tablet TAKE ONE TABLET BY MOUTH DAILY (Patient not taking: Reported on 03/29/2022) 90 tablet 1   No current facility-administered medications for this visit.     Psychiatric Specialty Exam: Review of Systems  There were no vitals taken for this visit.There is no height or weight on file to calculate BMI.  General Appearance: Guarded and Well Groomed  Eye Contact:  Fair  Speech:  Clear and Coherent and Normal Rate  Volume:  Normal  Mood:  Anxious  Affect:  Congruent  Thought Process:  Coherent, Goal Directed, and Linear  Orientation:  Full (Time, Place, and Person)  Thought Content: Logical   Suicidal Thoughts:  No  Homicidal Thoughts:  No  Memory:  Immediate;   Good  Judgement:  Fair  Insight:  Fair  Psychomotor Activity:  Normal  Concentration:  Concentration: Good  Recall:  Good  Fund of Knowledge: Fair  Language: Good  Akathisia:  NA    AIMS (if indicated): not done  Assets:  Communication Skills Desire for Improvement Financial Resources/Insurance Housing Talents/Skills Transportation Vocational/Educational  ADL's:  Intact  Cognition: WNL  Sleep:  Good   Metabolic Disorder Labs: Lab Results  Component Value Date   HGBA1C 5.3 03/01/2022   Lab Results  Component Value Date   PROLACTIN 11.5 10/07/2018   PROLACTIN 10.3 06/11/2012   Lab Results  Component Value Date   CHOL 145 06/12/2021   TRIG 52 06/12/2021   HDL 71 06/12/2021   CHOLHDL 2.0 06/12/2021   VLDL 26 08/13/2016   LDLCALC 63 06/12/2021   LDLCALC 119 (H) 12/22/2019   Lab Results  Component Value Date   TSH 0.694 10/07/2018   TSH 1.080 09/23/2017    Therapeutic Level Labs: No results found for: "LITHIUM" No results found for: "VALPROATE" No results found for: "CBMZ"   Screenings: GAD-7    Flowsheet Row Office Visit from  07/11/2022 in [place] Video Visit from 04/23/2022 in [facility] Video Visit from 05/26/2021 in [facility] Office Visit from 04/12/2021 in [place] for [place] at [facility] Video Visit from 02/28/2021 in [facility]  Total GAD-7 Score 13 0 7 5 2  [place] Office Visit from 07/11/2022 in [facility] Video Visit from 04/23/2022 in [facility] Office Visit from 03/08/2022 in [place] Office Visit from 06/12/2021 in [place] Video Visit from 05/26/2021 in [facility]  PHQ-2 Total Score 2 0 0 4 2  PHQ-9 Total Score 9 0 '1 12 9      '$ Flowsheet Row ED from 10/21/2021 in [place] Video Visit from 02/28/2021 in [place] No Risk No Risk       Collaboration of Care: Collaboration of Care: Medication Management AEB medication prescription and Other provider involved in patient's care [place] neurology and sports medicine chart review   Patient/Guardian was advised Release of Information must be obtained prior to any record release in order to collaborate their care with an outside provider. Patient/Guardian was advised if they have not already done so to contact the registration department to sign all necessary forms in order for [name] to release information regarding their care.   Consent: Patient/Guardian gives verbal consent for treatment and assignment of benefits for services provided during this visit. Patient/Guardian expressed understanding and agreed to proceed.    Vista Mink, MD 07/17/2022, 7:54 AM   Virtual Visit via Video Note  I connected with [name] on 07/17/22 at  8:00 AM EST by a video enabled telemedicine application and verified that I am  speaking with the correct person using two identifiers.  Location: Patient: home Provider: Home Office   I discussed the limitations of evaluation and management by telemedicine and the availability of in person appointments. The patient expressed understanding and agreed to proceed.   I discussed the assessment and treatment plan with the patient. The patient was provided an opportunity to ask questions and all were answered. The patient agreed with the plan and demonstrated an understanding of the instructions.   The patient was advised to call back or seek an in-person evaluation if the symptoms worsen or if the condition fails to improve as anticipated.  I provided 20 minutes of non-face-to-face time during this encounter.   Vista Mink, MD

## 2022-07-19 ENCOUNTER — Other Ambulatory Visit (HOSPITAL_COMMUNITY): Payer: Self-pay

## 2022-07-19 ENCOUNTER — Telehealth: Payer: Self-pay

## 2022-07-19 NOTE — Telephone Encounter (Signed)
Prior Auth for patients medication MOUNJARO approved by [facility] from 07/19/22 to 07/20/23.  Key: Tracy Weiss

## 2022-07-19 NOTE — Telephone Encounter (Signed)
A Prior Authorization RENEWAL was initiated for this patients MOUNJARO through CoverMyMeds.   Key: Vonna Kotyk

## 2022-07-25 ENCOUNTER — Other Ambulatory Visit: Payer: Self-pay | Admitting: Family Medicine

## 2022-07-30 NOTE — Telephone Encounter (Signed)
Rx refill request approved per Dr. Corey's orders. 

## 2022-08-01 ENCOUNTER — Ambulatory Visit (INDEPENDENT_AMBULATORY_CARE_PROVIDER_SITE_OTHER): Payer: 59 | Admitting: Family Medicine

## 2022-08-01 VITALS — BP 118/82 | HR 77 | Ht 61.5 in

## 2022-08-01 DIAGNOSIS — F411 Generalized anxiety disorder: Secondary | ICD-10-CM | POA: Diagnosis not present

## 2022-08-01 DIAGNOSIS — F900 Attention-deficit hyperactivity disorder, predominantly inattentive type: Secondary | ICD-10-CM

## 2022-08-01 NOTE — Progress Notes (Signed)
   Tracy Weiss, am serving as a Education administrator for Dr. Lynne Weiss.  Tracy Weiss is a 56 y.o. female who presents to [place] at [facility] today for follow up of post concussion. Patient was last seen on 07/11/22 for this reason and states Today, pt reports cloudiness in her head, scrambled in her thoughts, memory issues, anxiousness. Pt feels like her job is triggering her symptoms. today patient reports that she has not started her Prozac because she has a new psych doctor Tracy Weiss. that wants to manage all her medications, even medications other doctors prescribe. 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     Dx imaging: 10/10/20 Brain MRI             06/27/20 Head & maxillofacial CT   Pertinent review of systems: No fevers or chills  Relevant historical information: Diabetes.  ADHD.  Anxiety.   Exam:  BP 118/82   Pulse 77   Ht 5' 1.5" (1.562 m)   SpO2 99%   BMI 25.09 kg/m  General: Well Developed, well nourished, and in no acute distress.   Psych: Alert and oriented normal speech thought process and affect.  Normal coordination and gait.       Assessment and Plan: 56 y.o. female with behavioral disturbance due to ADHD and anxiety.  This worsened following a concussion/TBI.  Her Vyvanse is currently managed by psychiatry Tracy Weiss.  However she had her first visit with him last month and they did not hit it off.  She fundamentally does not trust her psychiatrist and is is happy for him to continue to manage Vyvanse for now but has not started management of anxiety.  I have encouraged her to discuss this with her primary care provider who she does trust.  What I fear is that she is going to fall through the cracks in her search for a behavioral health care provider who she has a better relationship with.  I do not think is  unreasonable.  I think it is totally valid and reasonable to have a good relationship with your healthcare team.  I do encourage her to start SSRI treatment or something similar for anxiety.  Recheck with me as needed   Discussed warning signs or symptoms. Please see discharge instructions. Patient expresses understanding.   The above documentation has been reviewed and is accurate and complete Tracy Weiss, M.D.

## 2022-08-01 NOTE — Patient Instructions (Addendum)
Thank you for coming in today.   Follow up with Dr Oleh Genin about your anxiety.   I agree that it is very important that you have a good fit with all of your doctors.   I also think its important for you to have good management of anxiety with somebody.   Let me know if I can help coordinate this.   I would encourage you to consider starting treatment for anxiety.

## 2022-08-08 ENCOUNTER — Other Ambulatory Visit: Payer: Self-pay | Admitting: Family Medicine

## 2022-08-13 ENCOUNTER — Telehealth (HOSPITAL_COMMUNITY): Payer: Self-pay | Admitting: *Deleted

## 2022-08-13 NOTE — Telephone Encounter (Signed)
Pt of [name] called requesting refill of Vyvanse 60 mg. Last script ws sent on 07/17/22 to [facility] pharmacy on [address]. However Probation officer checked with [facility] pharmacy and they said Rx sent in on 08/08/22 and filled on 08/09/22. Pt states that she doesn't want there to be a gap in scripts as pt has been rescheduled for 10/04/22. Do you want pt to call when refill due? Or send in with next fill date? Please review.

## 2022-08-13 NOTE — Telephone Encounter (Signed)
I called patient and left a message.  Patient is not due for Vyvanse at this time.  She picked up a prescription on December 7.

## 2022-08-14 ENCOUNTER — Telehealth (HOSPITAL_COMMUNITY): Payer: 59 | Admitting: Psychiatry

## 2022-08-20 ENCOUNTER — Encounter: Payer: Self-pay | Admitting: Pharmacist

## 2022-08-20 ENCOUNTER — Encounter: Payer: Self-pay | Admitting: Family Medicine

## 2022-08-20 ENCOUNTER — Ambulatory Visit (INDEPENDENT_AMBULATORY_CARE_PROVIDER_SITE_OTHER): Payer: 59 | Admitting: Pharmacist

## 2022-08-20 ENCOUNTER — Ambulatory Visit: Payer: 59 | Admitting: Family Medicine

## 2022-08-20 VITALS — Wt 137.0 lb

## 2022-08-20 VITALS — BP 120/80 | HR 78 | Ht 61.5 in | Wt 137.0 lb

## 2022-08-20 DIAGNOSIS — F900 Attention-deficit hyperactivity disorder, predominantly inattentive type: Secondary | ICD-10-CM | POA: Diagnosis not present

## 2022-08-20 DIAGNOSIS — F411 Generalized anxiety disorder: Secondary | ICD-10-CM

## 2022-08-20 DIAGNOSIS — Z23 Encounter for immunization: Secondary | ICD-10-CM | POA: Diagnosis not present

## 2022-08-20 DIAGNOSIS — E119 Type 2 diabetes mellitus without complications: Secondary | ICD-10-CM | POA: Diagnosis not present

## 2022-08-20 DIAGNOSIS — E785 Hyperlipidemia, unspecified: Secondary | ICD-10-CM | POA: Diagnosis not present

## 2022-08-20 DIAGNOSIS — I10 Essential (primary) hypertension: Secondary | ICD-10-CM

## 2022-08-20 DIAGNOSIS — Z794 Long term (current) use of insulin: Secondary | ICD-10-CM

## 2022-08-20 MED ORDER — LISDEXAMFETAMINE DIMESYLATE 60 MG PO CAPS: 60.0000 mg | ORAL_CAPSULE | ORAL | 0 refills | Status: DC

## 2022-08-20 MED ORDER — OLMESARTAN MEDOXOMIL 5 MG PO TABS: 5.0000 mg | ORAL_TABLET | Freq: Every day | ORAL | 3 refills | Status: DC

## 2022-08-20 MED ORDER — ROSUVASTATIN CALCIUM 20 MG PO TABS: 20.0000 mg | ORAL_TABLET | Freq: Every day | ORAL | 1 refills | Status: DC

## 2022-08-20 MED ORDER — MOUNJARO 12.5 MG/0.5ML ~~LOC~~ SOAJ: SUBCUTANEOUS | 0 refills | Status: DC

## 2022-08-20 NOTE — Assessment & Plan Note (Signed)
BP well controlled and not currently on any medications. Given the history of diabetes, we do recommend ARB for kidney protection.  - Olmesartan '5mg'$  daily  - IF symptomatic or hypotensive, patient to discontinue - Labs in 1 week to monitor renal function in patient able to continue medication

## 2022-08-20 NOTE — Progress Notes (Signed)
    S:     Chief Complaint  Patient presents with   Medication Management    Med Review   56 y.o. female who presents for diabetes evaluation, education, and management.  PMH is significant for history of diabetes and overweight.  Patient was last seen by Primary Care Provider, Dr. Jeani Hawking, on 04/04/2022 (seeing Dr. Oleh Genin - new PCP today as well).   I have seen this patient multiple times in the past.  She has worked hard to achieve weight loss and blood sugar control.   Last Rx Clinic face-to-face visit, 03/2019.   Today, patient arrives in good spirits and presents without any assistance.   Patient reports Diabetes has been under excellent control.  We discussed the A1C of 5.3 at last check and advised against taking an insulin PRN.   Current diabetes medications include: Mounjaro (tirzepatide)  12.'5mg'$  once weekly.   Patient reports adherence to taking all medications as prescribed.    Do you feel that your medications are working for you? yes However, she is trying to consolidate medications with fewer prescribers.  We briefly discussed advantages of non-residency practices.  She states she would like to continue an on-going med review relationship with me.  I shared that this could only be continued if she sees a provider in our building.   Patient denies hypoglycemic events.  Reported home fasting blood sugars: good (normal)   O:   ROS  Physical Exam Reports continued difficulty with concentration.    Lab Results  Component Value Date   HGBA1C 5.3 03/01/2022    Lipid Panel     Component Value Date/Time   CHOL 145 06/12/2021 0909   TRIG 52 06/12/2021 0909   HDL 71 06/12/2021 0909   CHOLHDL 2.0 06/12/2021 0909   CHOLHDL 2.9 08/13/2016 1424   VLDL 26 08/13/2016 1424   LDLCALC 63 06/12/2021 0909    Clinical Atherosclerotic Cardiovascular Disease (ASCVD): No  The 10-year ASCVD risk score (Arnett DK, et al., 2019) is: 4.9%   Values used to calculate the score:      Age: 68 years     Sex: Female     Is Non-Hispanic African American: Yes     Diabetic: Yes     Tobacco smoker: No     Systolic Blood Pressure: 341 mmHg     Is BP treated: Yes     HDL Cholesterol: 71 mg/dL     Total Cholesterol: 145 mg/dL    A/P: Diabetes longstanding currently with excellent control and a BMI of 25. . -Continued GLP-1/GIP  Mounjaro (tirzepatide) at 12.'5mg'$  weekly.   -Patient educated on purpose, proper use, and potential adverse effects. -Extensively discussed pathophysiology of diabetes, recommended lifestyle interventions, dietary effects on blood sugar control.  -Counseled on s/sx of and management of hypoglycemia.  -Next A1c anticipated annual physical 2024.  Deferred blood pressure, cholesterol and other meds to potential new PCP, Dr. Oleh Genin.  No medication adjustment issue noted today.    Written patient instructions provided. Patient verbalized understanding of treatment plan.  Total time in face to face counseling 15 minutes.    Follow-up:  Pharmacist PRN.

## 2022-08-20 NOTE — Progress Notes (Signed)
    SUBJECTIVE:   CHIEF COMPLAINT / HPI:   T2DM, well controlled - On Monjuaro with significant improvement - Last A1c in June was 5.3, defer re-checking today given great control recently  HTN - No longer taking olmesartan-HCTZ  Anxiety  ADHD - Vyvanse prescribed by [name]  - Not yet taking Prozac, psychiatry would prefer to manage all psych medications per prior note - Wants [name] to take over her psych prescription   PERTINENT  PMH / PSH: Hx of TBI  OBJECTIVE:   BP 120/80   Pulse 78   Ht 5' 1.5" (1.562 m)   Wt 137 lb (62.1 kg)   SpO2 98%   BMI 25.47 kg/m   General: NAD, well-appearing, well-nourished Respiratory: No respiratory distress, breathing comfortably, able to speak in full sentences Skin: warm and dry, no rashes noted on exposed skin Psych: Appropriate affect and mood  ASSESSMENT/PLAN:   Essential hypertension, benign BP well controlled and not currently on any medications. Given the history of diabetes, we do recommend ARB for kidney protection.  - Olmesartan '5mg'$  daily  - IF symptomatic or hypotensive, patient to discontinue - Labs in 1 week to monitor renal function in patient able to continue medication  Type 2 diabetes mellitus without complication, with long-term current use of insulin Stable. [name] refill prescribed - Rosuvastatin refilled - A1c in 6 months given great control  Generalized anxiety disorder Currently not taking Fluoxetine. Has been working through her concussion/TBI issues this year but is re-evaluating if she will be able to keep up with the pace of her job given it has significant stressors.   Attention deficit hyperactivity disorder (ADHD), predominantly inattentive type Patient no longer wanting to see new psych provider. We will take over the prescription for her Vyvanse, she is currently stable.  - Refill for Vyvanse for January prescribed   Flu vaccine - Given today  Tracy Weiss, [place]

## 2022-08-20 NOTE — Patient Instructions (Signed)
Great to see you today.    Continue Mounjaro - same dose.

## 2022-08-20 NOTE — Assessment & Plan Note (Signed)
Diabetes longstanding currently with excellent control and a BMI of 25. . -Continued GLP-1/GIP  Mounjaro (tirzepatide) at 12.'5mg'$  weekly.   -Patient educated on purpose, proper use, and potential adverse effects. -Extensively discussed pathophysiology of diabetes, recommended lifestyle interventions, dietary effects on blood sugar control.  -Counseled on s/sx of and management of hypoglycemia.  -Next A1c anticipated annual physical 2024.

## 2022-08-20 NOTE — Patient Instructions (Signed)
I have sent in all of the prescriptions, let me know if there are any issues with obtaining any of them and we can get them sent wherever you need.  We will do your A1c in the next 6 months, but you have been doing amazing!

## 2022-08-20 NOTE — Assessment & Plan Note (Signed)
Patient no longer wanting to see new psych provider. We will take over the prescription for her Vyvanse, she is currently stable.  - Refill for Vyvanse for January prescribed

## 2022-08-20 NOTE — Assessment & Plan Note (Signed)
Currently not taking Fluoxetine. Has been working through her concussion/TBI issues this year but is re-evaluating if she will be able to keep up with the pace of her job given it has significant stressors.

## 2022-08-20 NOTE — Assessment & Plan Note (Addendum)
Stable. [name] refill prescribed - Rosuvastatin refilled - A1c in 6 months given great control

## 2022-08-23 NOTE — Progress Notes (Signed)
Reviewed: I agree with Dr. Koval's documentation and management. 

## 2022-08-28 ENCOUNTER — Telehealth (HOSPITAL_COMMUNITY): Payer: 59 | Admitting: Psychiatry

## 2022-08-29 ENCOUNTER — Other Ambulatory Visit: Payer: Self-pay | Admitting: Family Medicine

## 2022-08-29 DIAGNOSIS — I10 Essential (primary) hypertension: Secondary | ICD-10-CM

## 2022-09-05 ENCOUNTER — Other Ambulatory Visit: Payer: 59

## 2022-09-05 DIAGNOSIS — I10 Essential (primary) hypertension: Secondary | ICD-10-CM

## 2022-09-06 LAB — BASIC METABOLIC PANEL
BUN/Creatinine Ratio: 12 (ref 9–23)
BUN: 11 mg/dL (ref 6–24)
CO2: 25 mmol/L (ref 20–29)
Calcium: 9.9 mg/dL (ref 8.7–10.2)
Chloride: 104 mmol/L (ref 96–106)
Creatinine, Ser: 0.89 mg/dL (ref 0.57–1.00)
Glucose: 126 mg/dL — ABNORMAL HIGH (ref 70–99)
Potassium: 4.4 mmol/L (ref 3.5–5.2)
Sodium: 142 mmol/L (ref 134–144)
eGFR: 77 mL/min/{1.73_m2} (ref >59)

## 2022-09-12 ENCOUNTER — Ambulatory Visit: Payer: 59 | Admitting: Family Medicine

## 2022-09-23 ENCOUNTER — Other Ambulatory Visit: Payer: Self-pay | Admitting: Family Medicine

## 2022-09-23 DIAGNOSIS — E119 Type 2 diabetes mellitus without complications: Secondary | ICD-10-CM

## 2022-09-26 ENCOUNTER — Other Ambulatory Visit: Payer: Self-pay | Admitting: Family Medicine

## 2022-09-26 DIAGNOSIS — Z1231 Encounter for screening mammogram for malignant neoplasm of breast: Secondary | ICD-10-CM

## 2022-09-27 DIAGNOSIS — Z1231 Encounter for screening mammogram for malignant neoplasm of breast: Secondary | ICD-10-CM

## 2022-10-03 ENCOUNTER — Telehealth (HOSPITAL_COMMUNITY): Payer: Self-pay | Admitting: Psychiatry

## 2022-10-03 NOTE — Telephone Encounter (Signed)
Patient called in to cancel her upcoming appointment and to let you know that her PCP will be managing her medications from now on.

## 2022-10-04 ENCOUNTER — Telehealth (HOSPITAL_COMMUNITY): Payer: 59 | Admitting: Psychiatry

## 2022-10-11 ENCOUNTER — Telehealth: Payer: Self-pay | Admitting: Family Medicine

## 2022-10-11 ENCOUNTER — Other Ambulatory Visit: Payer: Self-pay | Admitting: Family Medicine

## 2022-10-11 DIAGNOSIS — E119 Type 2 diabetes mellitus without complications: Secondary | ICD-10-CM

## 2022-10-11 MED ORDER — MOUNJARO 12.5 MG/0.5ML ~~LOC~~ SOAJ: SUBCUTANEOUS | 1 refills | Status: DC

## 2022-10-11 NOTE — Telephone Encounter (Signed)
Patient came in asking if she could get her mounjaro changed to being prescribed for 3 months at a time instead of one month if possible.

## 2022-10-12 ENCOUNTER — Ambulatory Visit: Payer: 59 | Admitting: Family Medicine

## 2022-10-12 ENCOUNTER — Encounter: Payer: Self-pay | Admitting: Family Medicine

## 2022-10-12 VITALS — BP 123/80 | HR 91 | Ht 61.5 in | Wt 143.0 lb

## 2022-10-12 DIAGNOSIS — E119 Type 2 diabetes mellitus without complications: Secondary | ICD-10-CM | POA: Diagnosis not present

## 2022-10-12 DIAGNOSIS — S060X0S Concussion without loss of consciousness, sequela: Secondary | ICD-10-CM

## 2022-10-12 DIAGNOSIS — F411 Generalized anxiety disorder: Secondary | ICD-10-CM

## 2022-10-12 DIAGNOSIS — I10 Essential (primary) hypertension: Secondary | ICD-10-CM

## 2022-10-12 DIAGNOSIS — Z794 Long term (current) use of insulin: Secondary | ICD-10-CM

## 2022-10-12 MED ORDER — OLMESARTAN MEDOXOMIL 5 MG PO TABS: 5.0000 mg | ORAL_TABLET | Freq: Every day | ORAL | 3 refills | Status: AC

## 2022-10-12 MED ORDER — FLUOXETINE HCL 10 MG PO CAPS: ORAL_CAPSULE | ORAL | 0 refills | Status: DC

## 2022-10-12 NOTE — Assessment & Plan Note (Addendum)
To start with the fluoxetine.  Feels that her symptoms since her concussion/TBI have gotten worse with work and increased stress levels, is thinking that she may need to consider some medical leave from work which she has discussed with her sports medicine physician (who she was following for the concussion). - Start fluoxetine 10 mg daily x 1 week, then increase to 20 mg daily if tolerated - Follow-up 3 to 4 weeks after starting the 20 mg daily - Discussed with Dr. Valentina Lucks and patient and risk of side effects with increased anxiety when initiating the medication - Encourage patient to go to [email or web address] to seek out new psychiatrist

## 2022-10-12 NOTE — Assessment & Plan Note (Signed)
BP well-controlled, 123/80 today.  Patient would like to start the ARB back. - Refill for olmesartan 5 mg daily started - Patient instructed if symptomatically hypotensive to stop the medication - BMP at next visit

## 2022-10-12 NOTE — Progress Notes (Addendum)
    SUBJECTIVE:   CHIEF COMPLAINT / HPI:   Medications  - Patient would like her Mounjaro sent in for 3 months at a time - Patient feels that it is time to start the antianxiety medication - Is needing a refill sent for her blood pressure medication which she is going to start taking  Generalized anxiety  prior concussion - Patient would like to start with the Prozac now - Is working on finding a therapist - Feels that her symptoms for her anxiety and ADHD have been worsened since her concussion/TBI - Has been having issues at work due to concerns with her memory and when the stress gets higher she finds herself more disorganized/forgetful  Continued care - Dr. Sheppard Coil, MD is the new provider that she would like to see upon graduation - She is going to touch base with their office to see what the timeline for establishing care  PERTINENT  PMH / PSH: T2DM (well controlled), HTN, ADHD, GAD  OBJECTIVE:   BP 123/80   Pulse 91   Ht 5' 1.5" (1.562 m)   Wt 143 lb (64.9 kg)   SpO2 100%   BMI 26.58 kg/m   General: NAD, well-appearing, well-nourished Respiratory: No respiratory distress, breathing comfortably, able to speak in full sentences Skin: warm and dry, no rashes noted on exposed skin Psych: Appropriate affect and mood  ASSESSMENT/PLAN:   Generalized anxiety disorder To start with the fluoxetine.  Feels that her symptoms since her concussion/TBI have gotten worse with work and increased stress levels, is thinking that she may need to consider some medical leave from work which she has discussed with her sports medicine physician (who she was following for the concussion). - Start fluoxetine 10 mg daily x 1 week, then increase to 20 mg daily if tolerated - Follow-up 3 to 4 weeks after starting the 20 mg daily - Discussed with Dr. Valentina Lucks and patient and risk of side effects with increased anxiety when initiating the medication - Encourage patient to go to  [email or web address] to seek out new psychiatrist  Essential hypertension, benign BP well-controlled, 123/80 today.  Patient would like to start the ARB back. - Refill for olmesartan 5 mg daily started - Patient instructed if symptomatically hypotensive to stop the medication - BMP at next visit  Type 2 diabetes mellitus without complication, with long-term current use of insulin - Continue Mounjaro at 12.5 mg weekly, refill previously sent     Tracy Weiss, [place]

## 2022-10-12 NOTE — Patient Instructions (Signed)
I have sent in the medications for the anxiety as well as the blood pressure.  Just make sure to keep an eye on your blood pressures that you are not having low symptoms such as dizziness, headaches, vision changes.  For your anxiety, the new medication can have a little bit of increased anxiety at first but that should improve with time.  You are going to take 1 tablet for 1 week and then if you are tolerating it well you can increase to 2 tablets after that.  Lets follow-up in about 1 to 2 months to check in on how you are doing.

## 2022-10-12 NOTE — Assessment & Plan Note (Signed)
-   Continue Mounjaro at 12.5 mg weekly, refill previously sent

## 2022-10-23 ENCOUNTER — Encounter: Payer: Self-pay | Admitting: Family Medicine

## 2022-10-24 ENCOUNTER — Other Ambulatory Visit: Payer: Self-pay

## 2022-10-24 DIAGNOSIS — E119 Type 2 diabetes mellitus without complications: Secondary | ICD-10-CM

## 2022-10-24 MED ORDER — MOUNJARO 12.5 MG/0.5ML ~~LOC~~ SOAJ: SUBCUTANEOUS | 1 refills | Status: DC

## 2022-10-24 NOTE — Telephone Encounter (Signed)
Patient LVM on nurse line stating that [name] was sent to the wrong pharmacy. She is requesting that prescription is canceled at [facility] and resent to [facility] on [address].   Called and canceled at [facility]. Sent prescription to [facility].   Attempted to inform patient of update. She did not answer.   Talbot Grumbling, RN

## 2022-10-26 ENCOUNTER — Ambulatory Visit: Payer: 59 | Admitting: Family Medicine

## 2022-11-02 ENCOUNTER — Other Ambulatory Visit: Payer: Self-pay

## 2022-11-02 DIAGNOSIS — Z794 Long term (current) use of insulin: Secondary | ICD-10-CM

## 2022-11-02 MED ORDER — MOUNJARO 12.5 MG/0.5ML ~~LOC~~ SOAJ: SUBCUTANEOUS | 1 refills | Status: DC

## 2022-11-07 ENCOUNTER — Telehealth: Payer: Self-pay | Admitting: Pharmacist

## 2022-11-07 ENCOUNTER — Encounter: Payer: Self-pay | Admitting: Family Medicine

## 2022-11-07 ENCOUNTER — Ambulatory Visit (INDEPENDENT_AMBULATORY_CARE_PROVIDER_SITE_OTHER): Payer: 59 | Admitting: Family Medicine

## 2022-11-07 VITALS — BP 144/92 | HR 95 | Ht 61.5 in | Wt 139.0 lb

## 2022-11-07 DIAGNOSIS — F0781 Postconcussional syndrome: Secondary | ICD-10-CM | POA: Diagnosis not present

## 2022-11-07 DIAGNOSIS — F411 Generalized anxiety disorder: Secondary | ICD-10-CM | POA: Diagnosis not present

## 2022-11-07 DIAGNOSIS — E119 Type 2 diabetes mellitus without complications: Secondary | ICD-10-CM

## 2022-11-07 DIAGNOSIS — F900 Attention-deficit hyperactivity disorder, predominantly inattentive type: Secondary | ICD-10-CM

## 2022-11-07 MED ORDER — MOUNJARO 12.5 MG/0.5ML ~~LOC~~ SOAJ: SUBCUTANEOUS | 1 refills | Status: DC

## 2022-11-07 MED ORDER — FLUOXETINE HCL 20 MG PO CAPS: 20.0000 mg | ORAL_CAPSULE | Freq: Every day | ORAL | 1 refills | Status: DC

## 2022-11-07 NOTE — Progress Notes (Signed)
   I, Tracy Weiss, CMA acting as a scribe for Tracy Leader, MD.  Tracy Weiss is a 57 y.o. female who presents to [place] at [facility] today for f/u post-concussion symptoms; behavioral disturbance, GAD and ADHD. On 06/27/20 when she was struck in the head by a swing set that she was trying to disassemble.  Pt was last seen by Dr. Georgina Weiss on 08/01/2022. Pt's PCP started her on fluoxetine at her visit on 10/12/22. Today, pt reports continued difficulty at work. Would like to discuss work accommodations. Continues to have difficulty processing information, despite medications. Difficulty with word recall.   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  She notes her caseload is building up because she cannot process claims fast enough before new claims come in.  She denies anxiety tends to make this worse.  Her PCP started her on fluoxetine 10 mg about a month ago.  She thinks this helped some.  Her PCP unfortunately is now on maternity leave and will soon be graduating the residency program.  Dx testing: 06/19/22 Neuro psych evaluation 10/10/20 Brain MRI             06/27/20 Head & maxillofacial CT  Pertinent review of systems: No fevers or chills  Relevant historical information: ADHD.  Diabetes.  Anxiety disorder.   Exam:  BP (!) 144/92   Pulse 95   Ht 5' 1.5" (1.562 m)   Wt 139 lb (63 kg)   SpO2 99%   BMI 25.84 kg/m  General: Well Developed, well nourished, and in no acute distress.   Psych: Alert and oriented.  Normal speech thought process and affect.  No SI or HI expressed.      Assessment and Plan: 56 y.o. female with postconcussion syndrome with cognitive dysfunction and significant anxiety.  Plan to increase fluoxetine to 20 mg from the 10 mg she was on.  This is what her PCP intended last month.  Tracy Weiss never did self titrate the fluoxetine.  Additionally will refer to to speech therapy for cognitive rehab.  Recheck in 1  month.  Work note provided.  There may be a different role at work that may be more suitable for her current state of function.  Additionally work from home 2 days a week would be helpful.   PDMP not reviewed this encounter. Orders Placed This Encounter  Procedures   Ambulatory referral to Speech Therapy    Referral Priority:   Routine    Referral Type:   Speech Therapy    Referral Reason:   Specialty Services Required    Requested Specialty:   Speech Pathology    Number of Visits Requested:   1   Meds ordered this encounter  Medications   FLUoxetine (PROZAC) 20 MG capsule    Sig: Take 1 capsule (20 mg total) by mouth daily.    Dispense:  30 capsule    Refill:  1     Discussed warning signs or symptoms. Please see discharge instructions. Patient expresses understanding.   The above documentation has been reviewed and is accurate and complete Tracy Weiss, M.D.

## 2022-11-07 NOTE — Patient Instructions (Addendum)
Thank you for coming in today.   Increase fluoxetine to '20mg'$  daily (new pill sent).   Send me HR forms.   Recheck in 1 month.

## 2022-11-07 NOTE — Telephone Encounter (Signed)
Patient calls office stating that [facility] does not have medication. She is requesting that prescription be transferred to [facility] on [address].   Called and canceled at [facility]. Resent to [facility] on [address].   Talbot Grumbling, RN

## 2022-11-07 NOTE — Telephone Encounter (Signed)
Patient called ~ 9:00 AM requesting new prescription for Mounjaro (tirzepatide) be sent to a new pharmacy as she has had trouble with supply access.   New Rx sent to [facility].  Called left HIPAA compliant message that new Rx was sent.

## 2022-11-07 NOTE — Addendum Note (Signed)
Addended by: Talbot Grumbling on: 11/07/2022 09:19 AM   Modules accepted: Orders

## 2022-11-08 NOTE — Telephone Encounter (Signed)
Reviewed and agree with Dr Graylin Shiver plan.

## 2022-11-13 ENCOUNTER — Ambulatory Visit: Payer: 59 | Attending: Family Medicine | Admitting: Speech Pathology

## 2022-11-13 ENCOUNTER — Ambulatory Visit: Payer: 59

## 2022-11-13 ENCOUNTER — Ambulatory Visit
Admission: RE | Admit: 2022-11-13 | Discharge: 2022-11-13 | Disposition: A | Discharge status: Home / Self Care | Payer: 59 | Source: Ambulatory Visit | Attending: Family Medicine | Admitting: Family Medicine

## 2022-11-13 DIAGNOSIS — F900 Attention-deficit hyperactivity disorder, predominantly inattentive type: Secondary | ICD-10-CM | POA: Insufficient documentation

## 2022-11-13 DIAGNOSIS — F411 Generalized anxiety disorder: Secondary | ICD-10-CM | POA: Diagnosis not present

## 2022-11-13 DIAGNOSIS — Z1231 Encounter for screening mammogram for malignant neoplasm of breast: Secondary | ICD-10-CM

## 2022-11-13 DIAGNOSIS — R41841 Cognitive communication deficit: Secondary | ICD-10-CM | POA: Insufficient documentation

## 2022-11-13 DIAGNOSIS — F0781 Postconcussional syndrome: Secondary | ICD-10-CM | POA: Diagnosis not present

## 2022-11-13 NOTE — Therapy (Signed)
OUTPATIENT SPEECH LANGUAGE PATHOLOGY EVALUATION   Patient Name: Tracy Weiss MRN: [id number] DOB:1966/04/22, 57 y.o., female Today's Date: 11/13/2022  PCP: Rise Patience, DO REFERRING PROVIDER: Gregor Hams, MD  END OF SESSION:  End of Session - 11/13/22 1023     Visit Number 1    SLP Start Time 62             Past Medical History:  Diagnosis Date   Abnormal uterine bleeding 09/26/2018   Acute cough 03/08/2022   Allergy    Attention deficit hyperactivity disorder (ADHD), predominantly inattentive type 10/31/2006   Bacterial vaginosis 11/07/2010   Cholecystitis 09/10/2016   Concussion 06/2020   Dysuria 03/31/2013   Essential hypertension, benign 07/07/2009   Fibroids, submucosal 07/13/2020   Gallstones 09/2016   Generalized anxiety disorder 03/30/2015   GERD (gastroesophageal reflux disease)    after gallbladder removed   Hip pain, acute 03/01/2020   History of reversal of tubal ligation 04/03/2016   Hyperlipidemia    per pt "taking as preventative"   IBS (irritable bowel syndrome)    Incontinence 10/08/2019   Left atrial enlargement 02/23/2019   Numbness and tingling of right side of face 03/26/2011   Obesity 04/13/2009   PCOS (polycystic ovarian syndrome) 06/25/2012   Post-viral cough syndrome 03/06/2021   Postconcussion syndrome 12/01/2020   Rhinitis 03/08/2022   Sore throat 03/08/2022   Toe injury 08/17/2019   Type 2 diabetes mellitus without complication, with long-term current use of insulin 12/17/2006   Vaginal discharge 01/23/2018   Viral illness 02/06/2021   Past Surgical History:  Procedure Laterality Date   CESAREAN SECTION     CHOLECYSTECTOMY N/A 09/11/2016   Procedure: LAPAROSCOPIC CHOLECYSTECTOMY WITH INTRAOPERATIVE CHOLANGIOGRAM;  Surgeon: Donnie Mesa, MD;  Location: [place];  Service: General;  Laterality: N/A;   DILATATION AND CURETTAGE/HYSTEROSCOPY WITH MINERVA N/A 02/04/2019   Procedure: DILATATION AND CURETTAGE/ABLATION WITH  MINERVA;  Surgeon: Emily Filbert, MD;  Location: [place];  Service: Gynecology;  Laterality: N/A;  MINERVA ABLATION   IR RADIOLOGIST EVAL & MGMT  04/07/2020   tubal ligaton reversal  2017   Patient Active Problem List   Diagnosis Date Noted   GERD (gastroesophageal reflux disease) 10/23/2021   Postconcussion syndrome 12/01/2020   Fibroids, submucosal 07/13/2020   Left atrial enlargement 02/23/2019   Abnormal uterine bleeding 09/26/2018   History of reversal of tubal ligation 04/03/2016   Generalized anxiety disorder 03/30/2015   Hyperlipidemia 10/12/2011   Numbness and tingling of right side of face 03/26/2011   IBS (irritable bowel syndrome) 12/29/2010   Essential hypertension, benign 07/07/2009   Type 2 diabetes mellitus without complication, with long-term current use of insulin 12/17/2006   Attention deficit hyperactivity disorder (ADHD), predominantly inattentive type 10/31/2006    ONSET DATE: 06/27/20   REFERRING DIAG:  Diagnosis  F41.1 (ICD-10-CM) - Generalized anxiety disorder  F07.81 (ICD-10-CM) - Postconcussion syndrome  F90.0 (ICD-10-CM) - Attention deficit hyperactivity disorder (ADHD), predominantly inattentive type    THERAPY DIAG:  Cognitive communication deficit  Rationale for Evaluation and Treatment: Rehabilitation  SUBJECTIVE:   SUBJECTIVE STATEMENT: Pt was pleasant and cooperative throughout assessment.  Pt accompanied by: self  PERTINENT HISTORY: post-concussion symptoms; behavioral disturbance, GAD and ADHD. On 06/27/20 when she was struck in the head by a swing set that she was trying to disassemble.   PAIN:  Are you having pain? No  FALLS: Has patient fallen in last 6 months?  No  LIVING ENVIRONMENT: Lives with: lives alone  Lives in: House/apartment  PLOF:  Level of assistance: Independent with ADLs, Independent with IADLs Employment: Full-time employment; Education officer, museum   PATIENT GOALS: attention, organization,  recall  OBJECTIVE:   DIAGNOSTIC FINDINGS:   CT WO CONTRAST  IMPRESSION: 1. Negative CT head 2. Negative for facial fracture.     Electronically Signed   By: Franchot Gallo M.D.   On: 06/27/2020 21:32  COGNITION: Overall cognitive status: Impaired Areas of impairment:  Attention: Impaired: Selective, Alternating, Divided Memory: Impaired: Short term  Executive function: Impaired: Organization Functional deficits: Difficulty managing work tasks  COGNITIVE COMMUNICATION: Following directions: Follows multi-step commands with increased time  Auditory comprehension: Impaired: Reports difficulty with understanding if given too much information at one time. Verbal expression: Impaired: reports decreased thought organization and occasional word finding Functional communication: Impaired: See above. Difficulty performing work tasks.    STANDARDIZED ASSESSMENTS: Began CLQT. To complete next session.   PATIENT REPORTED OUTCOME MEASURES (PROM): NA   TODAY'S TREATMENT:                                                                                                                                         DATE:    PATIENT EDUCATION: Education details: Cog-comm Person educated: Patient Education method: Explanation Education comprehension: needs further education   GOALS: Goals reviewed with patient? Yes  SHORT TERM GOALS: Target date: 12/14/22   Pt will verbalize 3 strategies to increase active listening skills for recall of important information with minA verbal cues. Baseline:  Goal status: INITIAL   Pt will verbalize 3 strategies to support memory with minA verbal cues.  Baseline:  Goal status: INITIAL  3.  Patient will verbalize 3 strategies to assist with organization of tasks and thoughts with minA verbal cues. Baseline:  Goal status: INITIAL    LONG TERM GOALS: Target date: 01/13/23  Pt will report successful use of active listening skills for recall of  important information.  Baseline:  Goal status: INTIAL    Pt will report successful use of strategies to support memory.  Baseline:  Goal status: INITIAL  Patient will report successful use of strategies to support task and thought organization. Baseline:  Goal status: INITIAL       ASSESSMENT:  CLINICAL IMPRESSION: Pt is a 57 yo female who presents to [place] for evaluation with post-concussion symptoms.  Patient was struck in the head by a swing set on 06/27/20.  Patient reports continued difficulty with work since 2021 and has had difficulty managing tasks since then. Pt endorses difficulty processing information, word retrieval, thought organization, significant overwhelm.  Patient reports she suffers from anxiety and ADHD which have been exacerbated since injury.  Patient received a neuropsych evaluation which was relatively unremarkable with the exception of verbal memory.  SLP began CLQT this session to further identify any obvious impairment.  SLP observed difficulty with topic maintenance as patient jumped from  topic to topic and was easily distracted. SLP rec skilled ST services to address suspect cognitive communication impairment secondary to exacerbated ADHD from previous concussion.  SLP to provide strategies to support her in work tasks.    OBJECTIVE IMPAIRMENTS: include attention, memory, and executive functioning. These impairments are limiting patient from effectively communicating at home and in community. Factors affecting potential to achieve goals and functional outcome are  NA .Marland Kitchen Patient will benefit from skilled SLP services to address above impairments and improve overall function.  REHAB POTENTIAL: Good  PLAN:  SLP FREQUENCY: 2x/week  SLP DURATION: 8 weeks  PLANNED INTERVENTIONS: Environmental controls, Cueing hierachy, Cognitive reorganization, Internal/external aids, Functional tasks, SLP instruction and feedback, Compensatory strategies, Patient/family  education, and Re-evaluation    Eevee Borbon L Kataleyah Carducci, CCC-SLP 11/13/2022, 10:24 AM

## 2022-11-15 ENCOUNTER — Ambulatory Visit: Payer: 59 | Admitting: Speech Pathology

## 2022-11-15 ENCOUNTER — Encounter: Payer: Self-pay | Admitting: Speech Pathology

## 2022-11-15 DIAGNOSIS — R41841 Cognitive communication deficit: Secondary | ICD-10-CM

## 2022-11-15 NOTE — Progress Notes (Signed)
   I, Tracy Weiss, CMA acting as a scribe for Tracy Leader, MD.  Tracy Weiss is a 57 y.o. female who presents to [place] at [facility] today for f/u post-concussion syndrome with cognitive dysfunction and significant anxiety. 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 Pt was last seen by Dr. [name] on 11/07/22 and her fluoxetine was increased to 20mg  and she was referred to speech therapy, completing 2 visits. Today, pt reports tolerating dose change well. Assessment completed with Speech Therapy.  She was advised to take a leave of absence from work and has been out of work since March 7.  She needs to have FMLA paperwork filled out.  Additionally she is interested in seeing a psychiatrist.   Dx testing: 06/19/22 Neuro psych evaluation 10/10/20 Brain MRI             06/27/20 Head & maxillofacial CT  Pertinent review of systems: No fevers or chills.  Relevant historical information: Hypertension.  Diabetes.  ADHD. Currently taking Vyvanse 60 for ADHD and fluoxetine 20 for anxiety/depression.  Exam:  BP 116/80   Pulse 85   Ht 5' 1.5" (1.562 m)   Wt 138 lb 3.2 oz (62.7 kg)   SpO2 100%   BMI 25.69 kg/m  General: Well Developed, well nourished, and in no acute distress.   Neuropsych: Alert and oriented.  Normal speech thought process and affect.  No SI or HI expressed.     Assessment and Plan: 57 y.o. female with postconcussion syndrome complicated by ADHD and anxiety.  Will complete paperwork for FMLA.  Agree with leave of absence.  Will extend leave of absence from March 7 till April 15.  Will complete FMLA also for intermittent leave as needed for doctor visits an appointment and for exacerbations.  For ADHD recommend [place] attention specialist.  Recommended several psychiatrists.  [facility] would be a good option.  For now increase fluoxetine from 20 mg to 40 mg in about 2 weeks.  Recheck with me on about April  15.   PDMP not reviewed this encounter. Orders Placed This Encounter  Procedures   Ambulatory referral to Psychiatry    Referral Priority:   Routine    Referral Type:   Psychiatric    Referral Reason:   Specialty Services Required    Requested Specialty:   Psychiatry    Number of Visits Requested:   1   Meds ordered this encounter  Medications   FLUoxetine (PROZAC) 40 MG capsule    Sig: Take 1 capsule (40 mg total) by mouth daily.    Dispense:  30 capsule    Refill:  1     Discussed warning signs or symptoms. Please see discharge instructions. Patient expresses understanding.   The above documentation has been reviewed and is accurate and complete Tracy Weiss, M.D.

## 2022-11-16 ENCOUNTER — Ambulatory Visit (INDEPENDENT_AMBULATORY_CARE_PROVIDER_SITE_OTHER): Payer: 59 | Admitting: Family Medicine

## 2022-11-16 ENCOUNTER — Telehealth: Payer: Self-pay

## 2022-11-16 ENCOUNTER — Encounter: Payer: Self-pay | Admitting: Family Medicine

## 2022-11-16 ENCOUNTER — Other Ambulatory Visit: Payer: Self-pay | Admitting: Family Medicine

## 2022-11-16 VITALS — BP 116/80 | HR 85 | Ht 61.5 in | Wt 138.2 lb

## 2022-11-16 DIAGNOSIS — F0781 Postconcussional syndrome: Secondary | ICD-10-CM

## 2022-11-16 DIAGNOSIS — F900 Attention-deficit hyperactivity disorder, predominantly inattentive type: Secondary | ICD-10-CM | POA: Diagnosis not present

## 2022-11-16 DIAGNOSIS — F411 Generalized anxiety disorder: Secondary | ICD-10-CM | POA: Diagnosis not present

## 2022-11-16 DIAGNOSIS — Z794 Long term (current) use of insulin: Secondary | ICD-10-CM

## 2022-11-16 MED ORDER — FLUOXETINE HCL 40 MG PO CAPS: 40.0000 mg | ORAL_CAPSULE | Freq: Every day | ORAL | 1 refills | Status: DC

## 2022-11-16 NOTE — Telephone Encounter (Signed)
Form brought in by patient today.   Requesting to pick forms up on Monday.
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
## 2022-11-16 NOTE — Patient Instructions (Addendum)
Thank you for coming in today.   In about 2 weeks increase fluoxetine to 40mg . I sent a new prescription to your pharmacy.   I also referred you to [facility].   Call those psychiatrists on your list and try to get in as well.   Short term leave from 11/08/22 till 12/17/22  Recheck on 12/17/22

## 2022-11-19 NOTE — Telephone Encounter (Signed)
Form completed, reviewed/signed by Dr. [name] and at the front desk for scanning/faxing/patient pick up.

## 2022-11-20 ENCOUNTER — Ambulatory Visit: Payer: 59 | Admitting: Speech Pathology

## 2022-11-20 ENCOUNTER — Encounter: Payer: Self-pay | Admitting: Speech Pathology

## 2022-11-20 DIAGNOSIS — R41841 Cognitive communication deficit: Secondary | ICD-10-CM
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
## 2022-11-21 NOTE — Telephone Encounter (Signed)
Patient came by today to review forms.   Pt would like to have last 3 letters from Dr. [name], the last Care Plan, and a note to contact me, Theadora Rama, with any questions or updates that may need to be made to forms. Pt also requested an e-mailed copy of documentation sent.   Letters, Care Plan, and note to contact me, Xzavion Doswell, regarding forms or questions directly have be attached to forms.   Forms at the front desk for scanning/faxing.

## 2022-11-22 ENCOUNTER — Ambulatory Visit: Payer: 59 | Admitting: Speech Pathology

## 2022-11-22 ENCOUNTER — Encounter: Payer: Self-pay | Admitting: Speech Pathology

## 2022-11-22 DIAGNOSIS — R41841 Cognitive communication deficit: Secondary | ICD-10-CM | POA: Diagnosis not present
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
## 2022-11-27 ENCOUNTER — Encounter: Payer: Self-pay | Admitting: Pharmacist

## 2022-11-27 ENCOUNTER — Other Ambulatory Visit (HOSPITAL_COMMUNITY): Payer: Self-pay

## 2022-11-27 ENCOUNTER — Ambulatory Visit (INDEPENDENT_AMBULATORY_CARE_PROVIDER_SITE_OTHER): Payer: 59 | Admitting: Pharmacist

## 2022-11-27 VITALS — BP 126/80 | HR 70 | Wt 138.4 lb

## 2022-11-27 DIAGNOSIS — E785 Hyperlipidemia, unspecified: Secondary | ICD-10-CM

## 2022-11-27 DIAGNOSIS — Z794 Long term (current) use of insulin: Secondary | ICD-10-CM | POA: Diagnosis not present

## 2022-11-27 DIAGNOSIS — E119 Type 2 diabetes mellitus without complications: Secondary | ICD-10-CM | POA: Diagnosis not present

## 2022-11-27 DIAGNOSIS — F411 Generalized anxiety disorder: Secondary | ICD-10-CM

## 2022-11-27 MED ORDER — MOUNJARO 12.5 MG/0.5ML ~~LOC~~ SOAJ
12.5000 mg | SUBCUTANEOUS | 2 refills | Status: DC
  Filled 2022-11-27 (×2): qty 2, 28d supply, fill #0

## 2022-11-27 MED ORDER — ROSUVASTATIN CALCIUM 20 MG PO TABS: 20.0000 mg | ORAL_TABLET | Freq: Every day | ORAL | 1 refills | Status: AC

## 2022-11-27 NOTE — Progress Notes (Signed)
S:     Chief Complaint  Patient presents with   Medication Management    Diabetes - Med Review   57 y.o. female who presents for diabetes evaluation, education, and management.  PMH is significant for ADHD, hypertension, GERD, hyperlipidemia, diabetes.  Patient was referred and last seen by Primary Care Provider, Dr. Oleh Genin, on 10/12/22.   At last visit, fluoxetine dose increased to 20 mg daily, and patient reports issues obtaining Mounjaro (tirzepatide) from pharmacy and has been out for the last two weeks.   Today, patient arrives in good spirits and presents without any assistance.  Patient reports Diabetes was diagnosed in 2008.   Current diabetes medications include: Mounjaro (tirzepatide) Current hypertension medications include: olmesartan (Benicar) 5 mg daily Current hyperlipidemia medications include: rosuvastatin (Crestor) 20 mg daily  Patient reports adherence to taking all medications as prescribed.   Insurance coverage: [employer]  Patient denies hypoglycemic events.  Patient denies nocturia (nighttime urination).  Patient denies neuropathy (nerve pain). Patient denies visual changes.  O:   Review of Systems  All other systems reviewed and are negative.   Physical Exam Constitutional:      Appearance: Normal appearance.  Pulmonary:     Effort: Pulmonary effort is normal.  Neurological:     Mental Status: She is alert.  Psychiatric:        Mood and Affect: Mood normal.        Behavior: Behavior normal.    Lab Results  Component Value Date   HGBA1C 5.3 03/01/2022   There were no vitals filed for this visit.  Lipid Panel     Component Value Date/Time   CHOL 145 06/12/2021 0909   TRIG 52 06/12/2021 0909   HDL 71 06/12/2021 0909   CHOLHDL 2.0 06/12/2021 0909   CHOLHDL 2.9 08/13/2016 1424   VLDL 26 08/13/2016 1424   LDLCALC 63 06/12/2021 0909    Clinical Atherosclerotic Cardiovascular Disease (ASCVD): No  The 10-year ASCVD risk score (Arnett  DK, et al., 2019) is: 4.3%   Values used to calculate the score:     Age: 62 years     Sex: Female     Is Non-Hispanic African American: Yes     Diabetic: Yes     Tobacco smoker: No     Systolic Blood Pressure: 99991111 mmHg     Is BP treated: Yes     HDL Cholesterol: 71 mg/dL     Total Cholesterol: 145 mg/dL    A/P: Diabetes diagnosed in 2008, currently well controlled with most recent A1c of 5.3% (02/2022). Patient is  able to verbalize appropriate hypoglycemia management plan. Patient reports being adherent to all medications except Mounjaro - has been out for the last two weeks due to issues with pharmacy not having medication in stock. -Continued Mounjaro (tirzepatide) 12.5 mg once weekly. Called new prescription over to [place] -Patient educated on purpose, proper use, and potential adverse effects -Extensively discussed pathophysiology of diabetes, recommended lifestyle interventions, dietary effects on blood sugar control.  -Counseled on s/sx of and management of hypoglycemia.   Hyperlipidemia diagnosed in 2013, currently controlled with most recent LDL < 70 mg/dL. Patient requested refill. - Continued rosuvastatin 20 mg daily. Sent new prescription to [facility]. -Patient educated on purpose, proper use and potential adverse effects.  Following instruction patient verbalized understanding of treatment plan.   History of post-concussion with symptoms of generalized anxiety disorder, diagnosed in 2016, currently managed on fluoxetine. Patient states Dr. [name] instructed  her to increase her dose from 20 to 40 mg at last visit, but states she has not titrated her dose yet. -Instructed patient to increase fluoxetine dose to 40 mg daily -Patient educated on purpose, proper use and potential adverse effects.  Following instruction patient verbalized understanding of treatment plan and will increase to 40mg  daily.   Written patient instructions provided. Patient verbalized  understanding of treatment plan.  Total time in face to face counseling 25 minutes.    Follow-up:  PCP clinic visit as needed Patient seen with Louanne Belton PharmD PGY-1 Pharmacy Resident

## 2022-11-27 NOTE — Assessment & Plan Note (Signed)
Hyperlipidemia diagnosed in 2013, currently controlled with most recent LDL < 70 mg/dL. Patient requested refill. - Continued rosuvastatin 20 mg daily. [facility] to [facility]. -Patient educated on purpose, proper use and potential adverse effects.  Following instruction patient verbalized understanding of treatment plan.

## 2022-11-27 NOTE — Patient Instructions (Signed)
It was great to see you today!  We sent prescriptions for your [facility] to [facility] and mounjaro to [place]!

## 2022-11-27 NOTE — Assessment & Plan Note (Signed)
Diabetes diagnosed in 2008, currently well controlled with most recent A1c of 5.3% (02/2022). Patient is  able to verbalize appropriate hypoglycemia management plan. Patient reports being adherent to all medications except Mounjaro - has been out for the last two weeks due to issues with pharmacy not having medication in stock. -Continued Mounjaro (tirzepatide) 12.5 mg once weekly. Called new prescription over to [place] -Patient educated on purpose, proper use, and potential adverse effects -Extensively discussed pathophysiology of diabetes, recommended lifestyle interventions, dietary effects on blood sugar control.  -Counseled on s/sx of and management of hypoglycemia.

## 2022-11-27 NOTE — Assessment & Plan Note (Signed)
History of post-concussion with symptoms of generalized anxiety disorder, diagnosed in 2016, currently managed on fluoxetine. Patient states Dr. [name] instructed her to increase her dose from 20 to 40 mg at last visit, but states she has not titrated her dose yet. -Instructed patient to increase fluoxetine dose to 40 mg daily -Patient educated on purpose, proper use and potential adverse effects.  Following instruction patient verbalized understanding of treatment plan and will increase to 40mg  daily.

## 2022-11-28 ENCOUNTER — Other Ambulatory Visit (HOSPITAL_COMMUNITY): Payer: Self-pay

## 2022-11-28 NOTE — Progress Notes (Signed)
Reviewed and agree with Dr Koval's plan.   

## 2022-11-29 ENCOUNTER — Ambulatory Visit: Payer: 59 | Admitting: Speech Pathology

## 2022-12-03 ENCOUNTER — Telehealth: Payer: Self-pay

## 2022-12-03 ENCOUNTER — Other Ambulatory Visit (HOSPITAL_COMMUNITY): Payer: Self-pay

## 2022-12-03 NOTE — Telephone Encounter (Signed)
FMLA/STD - [employer]  Due 12/04/2022  Fax to [phone]

## 2022-12-04 ENCOUNTER — Ambulatory Visit: Payer: 59 | Attending: Family Medicine | Admitting: Speech Pathology

## 2022-12-04 DIAGNOSIS — R41841 Cognitive communication deficit: Secondary | ICD-10-CM | POA: Diagnosis present

## 2022-12-04 NOTE — Telephone Encounter (Signed)
Called [employer], deadline extended to 12/05/22.
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
## 2022-12-05 NOTE — Telephone Encounter (Signed)
Form completed and placed on Dr. Clovis Riley desk for review and signature.

## 2022-12-05 NOTE — Telephone Encounter (Signed)
Form at the front desk for faxing and scanning.

## 2022-12-05 NOTE — Telephone Encounter (Addendum)
Called pt and left VM to call the office.  I need to know the following information:   Has pt reported or demonstrated any of the following: Significant weight/appetite change Appropriate dress/hygiene Operating motor vehicle Cleans/maintains residence Sleep disturbance Socialization problems Paying bills Risk to self/others  Has pt reported any other the following work-related issues: Change in work demands Conflicts with supervisor Change in work environment Recent unfavorable work eval  Please explain what changed to cause pt to be unable to work at this time.

## 2022-12-11 ENCOUNTER — Encounter: Payer: Self-pay | Admitting: Speech Pathology

## 2022-12-11 ENCOUNTER — Telehealth: Payer: Self-pay | Admitting: Pharmacist

## 2022-12-11 ENCOUNTER — Ambulatory Visit: Payer: 59 | Admitting: Speech Pathology

## 2022-12-11 ENCOUNTER — Telehealth: Payer: Self-pay

## 2022-12-11 ENCOUNTER — Other Ambulatory Visit (HOSPITAL_COMMUNITY): Payer: Self-pay

## 2022-12-11 DIAGNOSIS — R41841 Cognitive communication deficit: Secondary | ICD-10-CM

## 2022-12-11 MED ORDER — OZEMPIC (2 MG/DOSE) 8 MG/3ML ~~LOC~~ SOPN: 2.0000 mg | PEN_INJECTOR | SUBCUTANEOUS | 3 refills | Status: DC
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
## 2022-12-11 NOTE — Telephone Encounter (Signed)
Phone call from patient reporting access issue with [facility].  She reports being out of her supply of this for > 1 weeks.   Requests being placed back on Ozempic (semaglutide) which she was on previously.  Agreed to restart 2mg  weekly which was her previous dose (prior to transitioning to [facility]).   New Rx provided. Continue to follow with PCP when she return to work following maternity absence.

## 2022-12-11 NOTE — Telephone Encounter (Signed)
A Prior Authorization was initiated for this patients OZEMPIC 2MG  DOSE PENS through CoverMyMeds.   Key: [id number]

## 2022-12-11 NOTE — Telephone Encounter (Signed)
Reviewed and agree with Dr Koval's plan.   

## 2022-12-11 NOTE — Telephone Encounter (Signed)
Prior Auth for patients medication OZEMPIC approved from 12/11/22 to 12/11/23.  CoverMyMeds Key: [id number]

## 2022-12-12 NOTE — Telephone Encounter (Signed)
Called patient and provided with update.   Devonte Migues C Armya Westerhoff, RN  

## 2022-12-13 ENCOUNTER — Encounter: Payer: Self-pay | Admitting: Speech Pathology

## 2022-12-13 ENCOUNTER — Encounter: Payer: Self-pay | Admitting: Family Medicine

## 2022-12-13 ENCOUNTER — Ambulatory Visit (INDEPENDENT_AMBULATORY_CARE_PROVIDER_SITE_OTHER): Payer: 59 | Admitting: Family Medicine

## 2022-12-13 ENCOUNTER — Ambulatory Visit: Payer: 59 | Admitting: Speech Pathology

## 2022-12-13 VITALS — BP 122/84 | HR 75 | Ht 61.5 in | Wt 131.0 lb

## 2022-12-13 DIAGNOSIS — F900 Attention-deficit hyperactivity disorder, predominantly inattentive type: Secondary | ICD-10-CM

## 2022-12-13 DIAGNOSIS — F0781 Postconcussional syndrome: Secondary | ICD-10-CM | POA: Diagnosis not present

## 2022-12-13 DIAGNOSIS — F411 Generalized anxiety disorder: Secondary | ICD-10-CM | POA: Diagnosis not present

## 2022-12-13 DIAGNOSIS — R41841 Cognitive communication deficit: Secondary | ICD-10-CM

## 2022-12-13 MED ORDER — FLUOXETINE HCL 20 MG PO CAPS: 20.0000 mg | ORAL_CAPSULE | Freq: Every day | ORAL | 3 refills | Status: DC

## 2022-12-13 NOTE — Patient Instructions (Addendum)
Thank you for coming in today.   Reduce Prozac back to 20.   Let me know the other medicine you were thinking about.   I think therapy/counseling is a great idea.   Let me know.   Anticipate Return to work in July..   Recheck in 6 weeks.

## 2022-12-13 NOTE — Progress Notes (Signed)
   [name], PhD, LAT, ATC acting as a scribe for [name], MD.  Tracy Weiss is a 57 y.o. female who presents to [facility] today for f/u post-concussion syndrome with cognitive dysfunction, ADHD, and significant anxiety. 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 Pt was last seen by Dr. Denyse Amass on 11/16/22 and her FMLA paperwork was completed to extend her leave till April 15th and advised to titrate her fluoxetine from 20 to 40mg .  She is scheduled with licensed clinical social worker for mental health therapy starting in June.  She did not tolerate the increased dose of fluoxetine  Today, pt reports continued bothersome symptoms.  She is having significant difficulty with anxiety and organizing.  She is attending cognitive therapy which does seem to be helpful but still struggling.  Dx testing: 06/19/22 Neuro psych evaluation 10/10/20 Brain MRI             06/27/20 Head & maxillofacial CT  Pertinent review of systems: No fevers or chills  Relevant historical information: ADHD   Exam:  BP 122/84   Pulse 75   Ht 5' 1.5" (1.562 m)   Wt 131 lb (59.4 kg)   SpO2 98%   BMI 24.35 kg/m  General: Well Developed, well nourished, and in no acute distress.   Neuropsych: Alert and oriented normal speech thought process and affect.  No SI or HI expressed.    Assessment and Plan: 57 y.o. female with postconcussion symptom complicated by ADHD and anxiety.  ADHD is pretty well-managed I think with Vyvanse.  We could try adjusting the medication in the future.  Anxiety is not well-controlled.  She is intolerant of higher doses of fluoxetine at 40 mg.  She is talk to some friends and her cognitive therapist who has done better with Lexapro.  That would be reasonable medicine to switch to.  During the visit she cannot remember the medicine so I did hear back afterwards of the medicine with Lexapro.  For now we will decrease  to Prozac to 20 but I sent a message asking if she wants to switch to Lexapro which will be reasonable.  Remain out of work.  Anticipate return to work day July 15  PDMP not reviewed this encounter. No orders of the defined types were placed in this encounter.  Meds ordered this encounter  Medications   FLUoxetine (PROZAC) 20 MG capsule    Sig: Take 1 capsule (20 mg total) by mouth daily.    Dispense:  30 capsule    Refill:  3     Discussed warning signs or symptoms. Please see discharge instructions. Patient expresses understanding.   The above documentation has been reviewed and is accurate and complete [name], M.D.

## 2022-12-14 ENCOUNTER — Other Ambulatory Visit: Payer: Self-pay | Admitting: Pharmacist

## 2022-12-14 ENCOUNTER — Other Ambulatory Visit: Payer: Self-pay

## 2022-12-14 ENCOUNTER — Other Ambulatory Visit (HOSPITAL_COMMUNITY): Payer: Self-pay

## 2022-12-14 DIAGNOSIS — Z794 Long term (current) use of insulin: Secondary | ICD-10-CM

## 2022-12-14 MED ORDER — MOUNJARO 12.5 MG/0.5ML ~~LOC~~ SOAJ
12.5000 mg | SUBCUTANEOUS | 2 refills | Status: DC
Start: 2022-12-14 — End: 2022-12-27
  Filled 2022-12-14 – 2022-12-17 (×2): qty 2, 28d supply, fill #0

## 2022-12-14 NOTE — Telephone Encounter (Signed)
Patient calls nurse line regarding [facility] prescription. She is requesting three month supply of medication.   Forwarding request to Dr. [name].   Veronda Prude, RN
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
## 2022-12-14 NOTE — Telephone Encounter (Signed)
Patient called and requested a change back to her previous prescription of Mounjaro (tirzepatide) which she has taken successfully in the past.   She reports she has NOT taken any Ozempic (semglutide) which was prescribed as an alternative to [employer] (tirzepatide) recently.   New prescription provided.

## 2022-12-16 ENCOUNTER — Other Ambulatory Visit: Payer: Self-pay | Admitting: Family Medicine

## 2022-12-16 DIAGNOSIS — E119 Type 2 diabetes mellitus without complications: Secondary | ICD-10-CM

## 2022-12-17 ENCOUNTER — Other Ambulatory Visit (HOSPITAL_COMMUNITY): Payer: Self-pay

## 2022-12-17 NOTE — Telephone Encounter (Signed)
Reviewed and agree with Dr Koval's plan.   

## 2022-12-18 ENCOUNTER — Encounter: Payer: Self-pay | Admitting: Speech Pathology

## 2022-12-18 ENCOUNTER — Ambulatory Visit: Payer: 59 | Admitting: Speech Pathology

## 2022-12-18 DIAGNOSIS — R41841 Cognitive communication deficit: Secondary | ICD-10-CM

## 2022-12-19 NOTE — Telephone Encounter (Signed)
Forwarding to Dr. Corey.  
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
## 2022-12-20 ENCOUNTER — Ambulatory Visit: Payer: 59 | Admitting: Speech Pathology

## 2022-12-20 ENCOUNTER — Other Ambulatory Visit (HOSPITAL_COMMUNITY): Payer: Self-pay

## 2022-12-20 ENCOUNTER — Telehealth: Payer: Self-pay

## 2022-12-20 NOTE — Telephone Encounter (Signed)
Received 12/19/22

## 2022-12-25 ENCOUNTER — Ambulatory Visit: Payer: 59 | Admitting: Speech Pathology

## 2022-12-25 MED ORDER — BUSPIRONE HCL 5 MG PO TABS: 5.0000 mg | ORAL_TABLET | Freq: Three times a day (TID) | ORAL | 1 refills | Status: DC | PRN

## 2022-12-25 NOTE — Telephone Encounter (Signed)
Forwarding to Dr. Denyse Amass to confirm dose.

## 2022-12-26 ENCOUNTER — Other Ambulatory Visit: Payer: Self-pay | Admitting: Pharmacist

## 2022-12-26 DIAGNOSIS — E119 Type 2 diabetes mellitus without complications: Secondary | ICD-10-CM

## 2022-12-26 NOTE — Telephone Encounter (Signed)
Form to Dr. Denyse Amass to review and sign.

## 2022-12-26 NOTE — Telephone Encounter (Signed)
Form placed at the front desk for faxing, scanning, and to provide a copy to pt.

## 2022-12-27 ENCOUNTER — Ambulatory Visit: Payer: 59 | Admitting: Speech Pathology

## 2023-01-01 ENCOUNTER — Encounter: Payer: Self-pay | Admitting: Speech Pathology

## 2023-01-01 ENCOUNTER — Ambulatory Visit: Payer: 59 | Admitting: Speech Pathology

## 2023-01-01 DIAGNOSIS — R41841 Cognitive communication deficit: Secondary | ICD-10-CM | POA: Diagnosis not present
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
## 2023-01-02 ENCOUNTER — Telehealth: Payer: Self-pay

## 2023-01-02 NOTE — Telephone Encounter (Signed)
[employer] - form received, include medical records.

## 2023-01-03 ENCOUNTER — Ambulatory Visit: Payer: 59 | Admitting: Speech Pathology

## 2023-01-07 NOTE — Telephone Encounter (Signed)
Spoke with patient to review questions on form.   Form completed and placed to Dr. Zollie Pee desk to review and sign.

## 2023-01-08 ENCOUNTER — Ambulatory Visit: Payer: 59 | Attending: Family Medicine | Admitting: Speech Pathology

## 2023-01-08 DIAGNOSIS — R41841 Cognitive communication deficit: Secondary | ICD-10-CM | POA: Insufficient documentation

## 2023-01-08 NOTE — Therapy (Signed)
OUTPATIENT SPEECH LANGUAGE PATHOLOGY TREATMENT NOTE   Patient Name: Tracy Weiss MRN: [id number] DOB:[birth year], 57 y.o., female Today's Date: 01/08/2023  PCP: Evelena Leyden, DO REFERRING PROVIDER: Rodolph Bong, MD  SPEECH THERAPY DISCHARGE SUMMARY  Visits from Start of Care: 10  Current functional level related to goals / functional outcomes: Pt has met all goals. She currently has several strategies in place that have been successful for her.    Remaining deficits: Attention   Education / Equipment: Completed   Patient agrees to discharge. Patient goals were met. Patient is being discharged due to meeting the stated rehab goals..     END OF SESSION:   End of Session - 01/08/23 0936     Visit Number 10    Number of Visits 17    Date for SLP Re-Evaluation 01/15/23    SLP Start Time 0930    SLP Stop Time  1010    SLP Time Calculation (min) 40 min    Activity Tolerance Patient tolerated treatment well             Past Medical History:  Diagnosis Date   Abnormal uterine bleeding 09/26/2018   Acute cough 03/08/2022   Allergy    Attention deficit hyperactivity disorder (ADHD), predominantly inattentive type 10/31/2006   Bacterial vaginosis 11/07/2010   Cholecystitis 09/10/2016   Concussion 06/2020   Dysuria 03/31/2013   Essential hypertension, benign 07/07/2009   Fibroids, submucosal 07/13/2020   Gallstones 09/2016   Generalized anxiety disorder 03/30/2015   GERD (gastroesophageal reflux disease)    after gallbladder removed   Hip pain, acute 03/01/2020   History of reversal of tubal ligation 04/03/2016   Hyperlipidemia    per pt "taking as preventative"   IBS (irritable bowel syndrome)    Incontinence 10/08/2019   Left atrial enlargement 02/23/2019   Numbness and tingling of right side of face 03/26/2011   Obesity 04/13/2009   PCOS (polycystic ovarian syndrome) 06/25/2012   Post-viral cough syndrome 03/06/2021   Postconcussion syndrome  12/01/2020   Rhinitis 03/08/2022   Sore throat 03/08/2022   Toe injury 08/17/2019   Type 2 diabetes mellitus without complication, with long-term current use of insulin 12/17/2006   Vaginal discharge 01/23/2018   Viral illness 02/06/2021   Past Surgical History:  Procedure Laterality Date   CESAREAN SECTION     CHOLECYSTECTOMY N/A 09/11/2016   Procedure: LAPAROSCOPIC CHOLECYSTECTOMY WITH INTRAOPERATIVE CHOLANGIOGRAM;  Surgeon: Manus Rudd, MD;  Location: [facility];  Service: General;  Laterality: N/A;   DILATATION AND CURETTAGE/HYSTEROSCOPY WITH MINERVA N/A 02/04/2019   Procedure: DILATATION AND CURETTAGE/ABLATION WITH MINERVA;  Surgeon: Allie Bossier, MD;  Location: [facility];  Service: Gynecology;  Laterality: N/A;  MINERVA ABLATION   IR RADIOLOGIST EVAL & MGMT  04/07/2020   tubal ligaton reversal  2017   Patient Active Problem List   Diagnosis Date Noted   GERD (gastroesophageal reflux disease) 10/23/2021   Postconcussion syndrome 12/01/2020   Fibroids, submucosal 07/13/2020   Left atrial enlargement 02/23/2019   Abnormal uterine bleeding 09/26/2018   History of reversal of tubal ligation 04/03/2016   Generalized anxiety disorder 03/30/2015   Hyperlipidemia 10/12/2011   Numbness and tingling of right side of face 03/26/2011   IBS (irritable bowel syndrome) 12/29/2010   Essential hypertension, benign 07/07/2009   Type 2 diabetes mellitus without complication, with long-term current use of insulin 12/17/2006   Attention deficit hyperactivity disorder (ADHD), predominantly inattentive type 10/31/2006     ONSET DATE: 06/27/20  REFERRING DIAG:  Diagnosis  F41.1 (ICD-10-CM) - Generalized anxiety disorder  F07.81 (ICD-10-CM) - Postconcussion syndrome  F90.0 (ICD-10-CM) - Attention deficit hyperactivity disorder (ADHD), predominantly inattentive type    THERAPY DIAG:  Cognitive communication deficit  Rationale for Evaluation and Treatment:  Rehabilitation  SUBJECTIVE:   SUBJECTIVE STATEMENT: "It's going good."   PAIN:  Are you having pain? No   OBJECTIVE:   TODAY'S TREATMENT:                                                                                                                                         DATE:   01/08/23: Pt was seen for skilled ST services with focus on cognitive-communication goals. Completed edu on memory strategies this session. Pt reports she could benefit from several of the internal strategies given this session, specifically PQRST method. Pt brought in her planner to show therapist and discuss best ways to use. Pt and SLP have been planning for d/c and both were in agreement with discharge this session. Pt reports she has learned a lot from her therapy sessions and looks forward to using strategies when she returns to work. Pt is aware she can reach back out to doc/therapist with any further questions.    01/01/23: Pt was seen for skilled ST services with focus on cognitive-communication goals.  Pt reports she has been using her strategies at home and that she is more mindful in conversations.  SLP began education on memory strategies this session.  Patient reports she could benefit from beginning a routine to review planner, as well as finding consistent places to put items so she is less likely to lose them.  SLP suggested getting a brighter phone case verses black, and demonstrated to patient how she can use the "ping" feature on her Apple Watch to locate her phone.  Will continue with memory strategies next session and begin discussing discharge.  12/18/22: Pt was seen for skilled ST services with focus on cognitive-communication goals. Pt reports she has been using strategies and she has benefited from SLP services. Pt asked about decreasing down to 1x/week. SLP was in agreement and planned to discuss this session. Completed attention strategies. To begin memory strategies next session. Pt also  reported she would like some word finding strategies.   12/14/22: Pt was seen for skilled ST services with focus on cognitive-communication goals. Pt reports that she feels the strategies she has learned in ST have been helpful for attention, organization, and prioritization. Pt was seen at doc appt this morning. She reports doc as recommended she be out of work until July 15. SLP recommended she shadow someone in her current position, or begin to get organized for return to work in June. SLP edu on time blocking to assist with schedule/routine and explained she can look through "time management strategies" that were given to her to find her best fit for her.  Continued with edu on attention strategies. To finish next session.   12/11/22: Pt  was seen for skilled ST services with focus on cognitive-communication. Pt reports she is enjoying completing brain dump and prioritizing with pickle jar theory.  Patient had questions regarding impulsivity associated with brain injury and ADHD.  SLP provided strategy handout to decrease impulsivity and communication as well spending.  Continue educating on attention and organization strategies.  Patient requested SLP assist her in creating a loose schedule/routine to increase productivity-SLP to suggest time blocking.  Pt reports she has made an appointment with a psychologist with [facility] in June.   12/04/22: Pt reports using the spoon theory at home. Pt brought notebook to therapy this date. Pt reports she feels that she is most impacted by sustained attention. Continued edu on attention and organization strategies. SLP suggested pt utilize [employer] to assist with prioritization of tasks. SLP provided pt with handouts explaining both. 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 Will cont with attention strategies next session.  **Pt reported her anxiety medication was causing her to "zone out". SLP rec she  reach out to doc.   11/22/22: Pt was seen for skilled ST services with focus on cognitive-communication goals. Pt completed HEP. Began edu on attention strategies. After explanation, pt reported difficulty with all 4 attention types. Pt reports her medication helps, but still has difficulty. SLP provided edu on "the spoon theory" and time management. Began discussing systems that may be helpful re: condensing items into 1-2 notebooks vs. Several notebooks. Bringing notebook to therapy vs. Writing on the backs of several different papers. SLP rec LOOP earplugs to assist with managing environmental distractions. To cont with strategies next session. Pt to complete spoon list for HEP.   11/20/22: Pt was seen for skilled ST services with focus on cognitive-communication goals. SLP reviewed CLQT results. Pt demonstrated understanding and had no further questions. SLP explained that CLQT is not always sensitive to higher-level cognitive impairment. SLP to treat pt symptoms/concerns.   SLP provided edu on relaxation strategies, as pt reports she experiences extreme anxiety with work. Pt was receptive to strategies and reports she "learned a lot today". Pt to attempt strategies at home and watch Netflix Headspace "Guide to Meditation" at home. Will cont with attention strategies next session.   11/15/22: Pt was seen for skilled ST services with focus on cognitive-communication goals. SLP completed CLQT re:   Attention [facility] Memory [facility] Exec Functions WFL Language WFL Visuospatial Skills [facility] Clock Drawing [facility]   Pt continued to seem confused as to why she was referred to speech therapy, as she thought she would be coming to see me to assist with anxiety. SLP re-explained the purpose and scope of ST. SLP will edu on relaxation strategies to assist until pt is able to see counselor/psych. SLP provided pt with psychiatrists that the neuropsychologist recommended, as well as information of how to look up counselors.  Also provided pt with work place accommodations she may be able to ask for 2/2 to concussion.   To begin attention strategies next session.    PATIENT EDUCATION: Education details: Cog-comm Person educated: Patient Education method: Explanation Education comprehension: needs further education     GOALS: Goals reviewed with patient? Yes   SHORT TERM GOALS: Target date: 12/14/22    Pt will verbalize 3 strategies to increase active listening skills for recall of important information with minA verbal cues. Baseline:  Goal status: MET   Pt  will verbalize 3 strategies to support memory with minA verbal cues.  Baseline:  Goal status: NOT MET   3.  Patient will verbalize 3 strategies to assist with organization of tasks and thoughts with minA verbal cues. Baseline:  Goal status: MET       LONG TERM GOALS: Target date: 01/13/23   Pt will report successful use of active listening skills for recall of important information.  Baseline:  Goal status: MET    Pt will report successful use of strategies to support memory.  Baseline:  Goal status: MET   Patient will report successful use of strategies to support task and thought organization. Baseline:  Goal status: MET         ASSESSMENT:   CLINICAL IMPRESSION: Pt has met all goals. To d/c this session. Pt in agreement.      OBJECTIVE IMPAIRMENTS: include attention, memory, and executive functioning. These impairments are limiting patient from effectively communicating at home and in community. Factors affecting potential to achieve goals and functional outcome are  NA .Marland Kitchen Patient will benefit from skilled SLP services to address above impairments and improve overall function.   REHAB POTENTIAL: Good   PLAN:   SLP FREQUENCY: 2x/week   SLP DURATION: 8 weeks   PLANNED INTERVENTIONS: Environmental controls, Cueing hierachy, Cognitive reorganization, Internal/external aids, Functional tasks, SLP instruction and feedback,  Compensatory strategies, Patient/family education, and Re-evaluation      Dorena Bodo, CCC-SLP 01/08/2023, 9:36 AM

## 2023-01-08 NOTE — Telephone Encounter (Signed)
Form reviewed and signed by Dr. Denyse Amass and placed at the front desk for scanning.

## 2023-01-23 NOTE — Progress Notes (Unsigned)
Tracy Payor, PhD, LAT, ATC acting as a scribe for Tracy Graham, MD.  Tracy Weiss is a 57 y.o. female who presents to [facility] at [facility] today for f/u post-concussion syndrome, ADHD, and anxiety. 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 Pt was last seen by Dr. Denyse Amass on 12/13/22 and she wanted to try Buspar, declining prozac. She has cont'd speech therapy, completing 10 visits. Today, pt reports minimal side effects with Buspar, tolerating well overall. Notes having more weird dreams recently. Still having some memory difficulty, has missed a few appointments. Has some concerns about returning to work, about not being quite prepared. Still having some anxiety in social settings.  She has taken Wellbutrin in the past and tolerated it.  She does have some difficulty with motivation and energy. Dx testing: 06/19/22 Neuro psych evaluation 10/10/20 Brain MRI             06/27/20 Head & maxillofacial CT  Pertinent review of systems: No fevers or chills  Relevant historical information: History depression and anxiety.  History ADHD.   Exam:  BP 110/80   Pulse 87   Ht 5' 1.5" (1.562 m)   Wt 140 lb (63.5 kg)   SpO2 97%   BMI 26.02 kg/m  General: Well Developed, well nourished, and in no acute distress.   Neuropsych: Alert and oriented normal speech thought process and affect.     01/24/2023    8:40 AM 10/12/2022    9:33 AM 07/11/2022    8:43 AM 04/23/2022    1:36 PM 03/08/2022    9:10 AM  Depression screen PHQ 2/9  Decreased Interest 2 1 2   0  Down, Depressed, Hopeless 0 0 0  0  PHQ - 2 Score 2 1 2   0  Altered sleeping 2 1 2   0  Tired, decreased energy 3 1 2   0  Change in appetite 0 1 1  0  Feeling bad or failure about yourself  1 0 0  0  Trouble concentrating 2 1 2  1   Moving slowly or fidgety/restless 1 1 0  0  Suicidal thoughts 0 0 0  0  PHQ-9 Score 11 6 9  1   Difficult doing work/chores Very difficult Very difficult  Somewhat difficult       Information is confidential and restricted. Go to Review Flowsheets to unlock data.      01/24/2023    8:42 AM 07/11/2022    8:42 AM 04/23/2022    1:37 PM 05/26/2021   11:48 AM  GAD 7 : Generalized Anxiety Score  Nervous, Anxious, on Edge 1 2    Control/stop worrying 2 2    Worry too much - different things 2 2    Trouble relaxing 2 3    Restless 2 2    Easily annoyed or irritable 1 2    Afraid - awful might happen 1 0    Total GAD 7 Score 11 13    Anxiety Difficulty Somewhat difficult Very difficult       Information is confidential and restricted. Go to Review Flowsheets to unlock data.          Assessment and Plan: 57 y.o. female with anxiety and depression symptoms coexisting with ADHD all occurring in the setting of postconcussion syndrome. We are working to optimize her mood symptoms.  Currently taking buspirone 5 mg 3 times daily for anxiety which seems to work  pretty well.  She is very worried about the potential sexual side effects of SSRI medications.  She does have some depression and anhedonia type symptoms so we can add Wellbutrin to see if that works a little better in conjunction with buspirone.  This is unlikely to cause any sexual side effects and she has tolerated in the past.  Recheck in a month.  Anticipated return to work date is July 15.   PDMP not reviewed this encounter. No orders of the defined types were placed in this encounter.  Meds ordered this encounter  Medications   buPROPion (WELLBUTRIN XL) 150 MG 24 hr tablet    Sig: Take 1 tablet (150 mg total) by mouth every morning.    Dispense:  90 tablet    Refill:  1   busPIRone (BUSPAR) 5 MG tablet    Sig: Take 1-2 tablets (5-10 mg total) by mouth 3 (three) times daily as needed (anxiety).    Dispense:  270 tablet    Refill:  1     Discussed warning signs or symptoms. Please see discharge instructions. Patient expresses understanding.   The above documentation  has been reviewed and is accurate and complete Tracy Weiss, M.D.

## 2023-01-24 ENCOUNTER — Encounter: Payer: Self-pay | Admitting: Family Medicine

## 2023-01-24 ENCOUNTER — Ambulatory Visit (INDEPENDENT_AMBULATORY_CARE_PROVIDER_SITE_OTHER): Payer: 59 | Admitting: Family Medicine

## 2023-01-24 VITALS — BP 110/80 | HR 87 | Ht 61.5 in | Wt 140.0 lb

## 2023-01-24 DIAGNOSIS — F900 Attention-deficit hyperactivity disorder, predominantly inattentive type: Secondary | ICD-10-CM

## 2023-01-24 DIAGNOSIS — F0781 Postconcussional syndrome: Secondary | ICD-10-CM | POA: Diagnosis not present

## 2023-01-24 DIAGNOSIS — R4584 Anhedonia: Secondary | ICD-10-CM

## 2023-01-24 DIAGNOSIS — F411 Generalized anxiety disorder: Secondary | ICD-10-CM

## 2023-01-24 MED ORDER — BUPROPION HCL ER (XL) 150 MG PO TB24
150.0000 mg | ORAL_TABLET | ORAL | 1 refills | Status: DC
Start: 2023-01-24 — End: 2023-04-05

## 2023-01-24 MED ORDER — BUSPIRONE HCL 5 MG PO TABS: 5.0000 mg | ORAL_TABLET | Freq: Three times a day (TID) | ORAL | 1 refills | Status: DC | PRN

## 2023-01-24 NOTE — Patient Instructions (Addendum)
Thank you for coming in today.   Continue Buspar up to 10mg  three times daily for anxiety.   We will restart Wellbutrrin daily for depression and motivation.   Recheck in about 1 month (June 15ish) and schedule before July 15.   We can change things as needed.

## 2023-01-31 ENCOUNTER — Other Ambulatory Visit: Payer: Self-pay | Admitting: Family Medicine

## 2023-01-31 DIAGNOSIS — E119 Type 2 diabetes mellitus without complications: Secondary | ICD-10-CM

## 2023-02-07 ENCOUNTER — Other Ambulatory Visit: Payer: Self-pay | Admitting: Family Medicine

## 2023-02-08 NOTE — Telephone Encounter (Signed)
Rx refill request approved per Dr. Corey's orders. 

## 2023-02-15 ENCOUNTER — Telehealth: Payer: Self-pay

## 2023-02-15 ENCOUNTER — Encounter: Payer: 59 | Admitting: Family Medicine

## 2023-02-15 NOTE — Telephone Encounter (Signed)
[employer] form received to be completed and return by 02/21/23. Include visit notes 01/02/23-present.

## 2023-02-17 NOTE — Progress Notes (Unsigned)
   [name], PhD, LAT, ATC acting as a scribe for Tracy Graham, MD.  Tracy Weiss is a 57 y.o. female who presents to [facility] at [facility] today for /u post-concussion syndrome, ADHD, and anxiety. 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 Pt was last seen by Dr. Denyse Amass on 01/24/23 and she was prescribed Wellbutrin as see was concerned about the sexual side effects of buspirone.   Today, pt reports ***  Dx testing: 06/19/22 Neuro psych evaluation 10/10/20 Brain MRI             06/27/20 Head & maxillofacial CT  Pertinent review of systems: ***  Relevant historical information: ***   Exam:  There were no vitals taken for this visit. General: Well Developed, well nourished, and in no acute distress.   MSK: ***    Lab and Radiology Results No results found for this or any previous visit (from the past 72 hour(s)). No results found.     Assessment and Plan: 57 y.o. female with ***   PDMP not reviewed this encounter. No orders of the defined types were placed in this encounter.  No orders of the defined types were placed in this encounter.    Discussed warning signs or symptoms. Please see discharge instructions. Patient expresses understanding.   ***

## 2023-02-18 ENCOUNTER — Encounter: Payer: Self-pay | Admitting: Family Medicine

## 2023-02-18 ENCOUNTER — Ambulatory Visit: Payer: 59 | Admitting: Family Medicine

## 2023-02-18 VITALS — BP 122/82 | HR 79 | Ht 61.5 in | Wt 141.0 lb

## 2023-02-18 DIAGNOSIS — F0781 Postconcussional syndrome: Secondary | ICD-10-CM | POA: Diagnosis not present

## 2023-02-18 NOTE — Patient Instructions (Addendum)
Thank you for coming in today.   Talk to your job about different roles. Make sure then send FMLA forms for intermittent FMLA roles.   Recheck before your anticipated return to work date on July 15.   Let me know if you need a letter before next month to allow HR to find you a different role.   I agree less hours and case load on return on the 15th.

## 2023-02-21 NOTE — Telephone Encounter (Signed)
Successfully faxed and copy sent to scan.

## 2023-02-21 NOTE — Telephone Encounter (Signed)
Form completed, reviewed/signed by Dr. Denyse Amass, and placed at the front desk for scanning/faxing.

## 2023-02-26 ENCOUNTER — Telehealth: Payer: Self-pay

## 2023-02-26 NOTE — Telephone Encounter (Signed)
Claim #[id number]  [employer] - Short-term Disability  Behavioral Health Attending Physician's Statement  DUE: 05/01/2023  FAX: [phone]

## 2023-02-26 NOTE — Telephone Encounter (Signed)
Called [employer]'s medical records department to confirm information needed. I was advised that form and visit notes faxed on 02/21/23 had been received and they need the discharge summary. I let them know that we do not have discharge summaries available. She will make note on the chart and contact [employer] case worker to update.

## 2023-02-26 NOTE — Telephone Encounter (Signed)
We will still need to complete the behavioral health form received from [employer] due on 05/01/23.

## 2023-03-12 NOTE — Telephone Encounter (Signed)
Called and spoke with rep at [employer] to discuss exactly what is needed, given the several forms that were received.  Pt states forms are due 03/13/23, the form I received had due date of 05/01/23.   Requested extension through 03/18/23, as pt is scheduled for f/u on 03/14/23. Request will be submitted, response time is typically one business day.

## 2023-03-14 ENCOUNTER — Ambulatory Visit (INDEPENDENT_AMBULATORY_CARE_PROVIDER_SITE_OTHER): Payer: 59 | Admitting: Family Medicine

## 2023-03-14 ENCOUNTER — Encounter: Payer: Self-pay | Admitting: Family Medicine

## 2023-03-14 VITALS — BP 118/78 | HR 92 | Ht 61.5 in | Wt 140.0 lb

## 2023-03-14 DIAGNOSIS — F0781 Postconcussional syndrome: Secondary | ICD-10-CM

## 2023-03-14 DIAGNOSIS — F900 Attention-deficit hyperactivity disorder, predominantly inattentive type: Secondary | ICD-10-CM

## 2023-03-14 DIAGNOSIS — F411 Generalized anxiety disorder: Secondary | ICD-10-CM | POA: Diagnosis not present

## 2023-03-14 MED ORDER — SERTRALINE HCL 25 MG PO TABS: ORAL_TABLET | ORAL | 0 refills | Status: DC

## 2023-03-14 NOTE — Progress Notes (Signed)
   I, Stevenson Clinch, CMA acting as a scribe for [name], MD.  Tracy Weiss is a 57 y.o. female who presents to [facility] at [facility] today for f /u post-concussion syndrome, ADHD, and anxiety. 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 Pt was last seen by Dr. Denyse Amass on 02/18/23 and they discussed her FMLA and return to work and possible different job roles.   Today, pt reports continued sx, though improving overall. Somewhat ready to return to work but has anxiety about returning to the same role. Tracy Weiss has started looking for a different role at the same company. She has been very stressed and emotional over the past few days trying to prepare for return to work. Concerned about feeling constantly overstimulated due to high work demand. She is concerned about continued fogginess and slurred speech with increased anxiety. Feels that she is not fully retaining information that she hears and reads. Disability runs out on 05/15/23.   Dx testing: 06/19/22 Neuro psych evaluation 10/10/20 Brain MRI             06/27/20 Head & maxillofacial CT  Pertinent review of systems: No fevers or chills  Relevant historical information: ADHD and anxiety   Exam:  BP 118/78   Pulse 92   Ht 5' 1.5" (1.562 m)   Wt 140 lb (63.5 kg)   SpO2 98%   BMI 26.02 kg/m  General: Well Developed, well nourished, and in no acute distress.   Neuropsych: Alert and oriented normal speech thought process and affect.  Expresses anxiety.  No SI or HI.       Assessment and Plan: 57 y.o. female with complicated multifactorial symptoms secondary to prior concussion with postconcussion syndrome, anxiety and ADHD.  Her main issue today seems to be anxiety.  We have tried fluoxetine in the past that she did not tolerate it.  She currently is taking Wellbutrin and BuSpar.  Will try different SSRI sertraline.  Hopefully she will be able to tolerate it better. Hopefully the  addition of Wellbutrin to SSRIs will reduce sexual side effects and she will tolerate that.  Recheck in 2 to 3 weeks. Cumulatively she had a lot of time in clinic today to figure out her workplace situation more thoroughly.  The last day she could return to work is September 11.  Will write a work note for that but ultimately she should not go back to the same job without restrictions.   PDMP not reviewed this encounter. No orders of the defined types were placed in this encounter.  Meds ordered this encounter  Medications   sertraline (ZOLOFT) 25 MG tablet    Sig: Take 1 tablet (25 mg total) by mouth daily for 7 days, THEN 2 tablets (50 mg total) daily for 21 days.    Dispense:  49 tablet    Refill:  0     Discussed warning signs or symptoms. Please see discharge instructions. Patient expresses understanding.   The above documentation has been reviewed and is accurate and complete [name], M.D. Total encounter time 30 minutes including face-to-face time with the patient and, reviewing past medical record, and charting on the date of service.

## 2023-03-14 NOTE — Patient Instructions (Addendum)
Thank you for coming in today.   Xanax or Klonopin is my backup plan.   For now we are adding Sertraline.  Take 1 pill a day for 1 week and then increase increase to 2 pills a day after 1 week.   Recheck with me in about 2-3 weeks.

## 2023-03-19 ENCOUNTER — Telehealth: Payer: Self-pay

## 2023-03-19 NOTE — Telephone Encounter (Signed)
Spoke with pt regarding ADA forms, will need to draft letter.

## 2023-03-19 NOTE — Telephone Encounter (Signed)
From completed, reviewed and signed by Dr. Denyse Amass, and placed at the front desk for faxing/scanning.

## 2023-03-19 NOTE — Telephone Encounter (Signed)
Called Crystal with [employer] and left VM to call the office.

## 2023-03-19 NOTE — Telephone Encounter (Signed)
Disability claim form Records 02/04/23- present  Received 03/19/23 Due 03/25/23

## 2023-03-20 NOTE — Telephone Encounter (Signed)
Letter and form placed at the front desk for pt to pick.   I will faxed to employer HR after patient reviews.

## 2023-03-20 NOTE — Telephone Encounter (Signed)
Letter drafted and placed on Dr. Zollie Pee desk to review and sign.

## 2023-03-20 NOTE — Telephone Encounter (Signed)
Called and spoke with patient.   She would like letter sent to her employer caseworker advising of the following:  Under Dr. Zollie Pee care since 3/7 Took leave of absence 02/01/23-03/31/23 d/t regression of symptoms and medication adjustments.  Per employers leave policy, patient is required to return to work by 03/31/23. Due to chronic condition, it is not recommended that pt return to current role at work.   Include visit notes from 02/01/23-present  Fax to Rosario Jacks at [phone]  Pt will pick up a copy of letter and ADA forms.

## 2023-03-20 NOTE — Telephone Encounter (Signed)
Patient called stating that she has some new information regarding the disability forms/letter. She would like for Brandy to call her back when available.  She can be reached at [phone].

## 2023-03-20 NOTE — Telephone Encounter (Signed)
Spoke with patient, she's reviewed letter from Dr. Denyse Amass and signed off for documents to be faxed to employer/HR.   Forms faxed to Rosario Jacks at [phone].

## 2023-03-28 ENCOUNTER — Telehealth: Payer: Self-pay

## 2023-03-28 NOTE — Telephone Encounter (Signed)
ADA form received from [employer]

## 2023-03-28 NOTE — Telephone Encounter (Signed)
Form completed and placed on Dr. Corey's desk to review and sign.  

## 2023-03-29 NOTE — Telephone Encounter (Signed)
Form reviewed and signed by Dr. Denyse Amass, placed at the front desk for scanning/faxing.

## 2023-04-03 ENCOUNTER — Other Ambulatory Visit: Payer: Self-pay | Admitting: Family Medicine

## 2023-04-03 NOTE — Telephone Encounter (Signed)
Will need to reach out to pt tomorrow to see how she is tolerating medication change prior to refilling 90 day supply.

## 2023-04-04 ENCOUNTER — Ambulatory Visit (INDEPENDENT_AMBULATORY_CARE_PROVIDER_SITE_OTHER): Payer: 59 | Admitting: Family Medicine

## 2023-04-04 ENCOUNTER — Encounter: Payer: Self-pay | Admitting: Family Medicine

## 2023-04-04 ENCOUNTER — Telehealth: Payer: Self-pay | Admitting: Family Medicine

## 2023-04-04 VITALS — BP 122/82 | HR 97 | Ht 61.5 in | Wt 143.0 lb

## 2023-04-04 DIAGNOSIS — F411 Generalized anxiety disorder: Secondary | ICD-10-CM

## 2023-04-04 DIAGNOSIS — F0781 Postconcussional syndrome: Secondary | ICD-10-CM | POA: Diagnosis not present

## 2023-04-04 NOTE — Patient Instructions (Addendum)
Thank you for coming in today.   I recommend we try something different that Sertaline like Lexapro.   Recheck in about 1 month  Ask Harvel Quale what your costs will be.  [phone]   Let me know tomorrow or Monday.

## 2023-04-04 NOTE — Telephone Encounter (Signed)
Received a call from Sterling with [facility] stating that the patient reached out to them to begin the Gene Test.   Call back # [phone]  Please advise on how you would like [name] to proceed with scheduling.

## 2023-04-04 NOTE — Progress Notes (Signed)
   I, Stevenson Clinch, CMA acting as a scribe for [name], MD.  Gean Quint Diblasio is a 57 y.o. female who presents to [facility] at [facility] today for f/u post-concussion syndrome, ADHD, and anxiety. 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 Pt was last seen by Dr. Denyse Amass on 03/14/23 and was prescribed sertraline.  Today, pt reports HA with new medication. Managed HA's with Tylenol the first 3 weeks, then sx became a little less severe. Overall, feels like she is going some better. Has not been sleeping well more recently. Notes that new medication caused "strange dreams". Feels that the medication is helpful for anxiety. Leave has been extended from 04/01/23 out 60 days d/t medication change. Scheduled to see ADHD provider 04/30/23. Pending refill request for Sertraline, 90 day supply.   She will be seeing her ADHD specialist in a few weeks.   Dx testing: 06/19/22 Neuro psych evaluation 10/10/20 Brain MRI             06/27/20 Head & maxillofacial CT  Pertinent review of systems: No fevers or chills  Relevant historical information: ADHD   Exam:  BP 122/82   Pulse 97   Ht 5' 1.5" (1.562 m)   Wt 143 lb (64.9 kg)   SpO2 98%   BMI 26.58 kg/m  General: Well Developed, well nourished, and in no acute distress.   Neuropsych: Alert and oriented.  Patient expresses anxiety.  Normal affect.  Normal speech and thought process.    Assessment and Plan: 56 y.o. female with postconcussion syndrome with anxiety and ADHD.  Primary issue now is anxiety.  At the last visit sertraline 25 mg was started.  She is having trouble tolerating it with headache and insomnia side effects.  She would like to wait until she sees her ADHD specialist to make another medicine change.  However I disagree I think she should stop sertraline now and probably start Lexapro.  She will think about it and let me know.  We also talked about the possibility of a [facility] test.   This will be expensive and I advised her to contact [facility] and see what her cost will be.  She is as above noted starting applying for a different role.  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  Also advised her to apply for various social work positions at the [employer].  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   PDMP not reviewed this encounter. No orders of the defined types were placed in this encounter.  No orders of the defined types were placed in this encounter.    Discussed warning signs or symptoms. Please see discharge instructions. Patient expresses understanding.   The above documentation has been reviewed and is accurate and complete [name], M.D.

## 2023-04-05 ENCOUNTER — Telehealth: Payer: Self-pay | Admitting: Pharmacist

## 2023-04-05 ENCOUNTER — Other Ambulatory Visit: Payer: Self-pay | Admitting: Family Medicine

## 2023-04-05 DIAGNOSIS — Z794 Long term (current) use of insulin: Secondary | ICD-10-CM

## 2023-04-05 NOTE — Telephone Encounter (Signed)
Patient called requesting assistance with medication refill processing.  I informed her that I would need her to process that through her PCP.  I last saw this patient in April.  She saw Dr. Clayborne Artist for office visit in February.   She shared that she had not yet met her new PCP.  She reported that she was doing well on Mounjaro (tirzepatide) and simply wanted to get refill sent into [facility].  I stated I would send Dr. Phineas Real a reminder phone note request for refill today.  Please refill if you think appropriate.    Total time with patient call and documentation of interaction: 11 minutes.  F/U Phone call planned: None

## 2023-04-05 NOTE — Telephone Encounter (Signed)
Order has been placed via [employer].

## 2023-04-05 NOTE — Telephone Encounter (Signed)
Rx refill request approved per Dr. Corey's orders. 

## 2023-04-05 NOTE — Telephone Encounter (Signed)
Happy to set up a nurse visit next week for [facility] testing.  We need to confirm that she is aware of the cost of this test ahead of time.

## 2023-04-05 NOTE — Telephone Encounter (Signed)
Reviewed and agree with Dr Koval's plan.   

## 2023-04-06 MED ORDER — MOUNJARO 12.5 MG/0.5ML ~~LOC~~ SOAJ
12.5000 mg | SUBCUTANEOUS | 2 refills | Status: DC
Start: 2023-04-06 — End: 2023-04-10

## 2023-04-06 NOTE — Telephone Encounter (Signed)
Mounjaro refilled. I see in previous note by Dr. Clayborne Artist that patient planned to see Dr. Lum Keas for primary care upon her graduation. Would like to see her in office before further refills, unless she decides to transfer care. Please call her and clarify/schedule.

## 2023-04-06 NOTE — Addendum Note (Signed)
Addended by: Evette Georges B on: 04/06/2023 08:12 AM   Modules accepted: Orders

## 2023-04-10 MED ORDER — MOUNJARO 12.5 MG/0.5ML ~~LOC~~ SOAJ: 12.5000 mg | SUBCUTANEOUS | 2 refills | Status: AC

## 2023-04-10 NOTE — Telephone Encounter (Signed)
Patient returns call to office. Medication was supposed to be sent to [facility].   Called and canceled at [facility].   Resent prescription to [facility].   Veronda Prude, RN

## 2023-04-10 NOTE — Addendum Note (Signed)
Addended by: Veronda Prude on: 04/10/2023 11:18 AM   Modules accepted: Orders

## 2023-05-03 NOTE — Progress Notes (Unsigned)
   [name], PhD, LAT, ATC acting as a scribe for [name], MD.  Gean Quint Ferrales is a 57 y.o. female who presents to [facility] at [facility] today for 62-month f/u post-concussion syndrome, w/ anxiety and ADHD. Pt was last seen by Dr. Denyse Amass on 04/04/23 they discussed switching to Lexapro, but pt wasn't to wait. 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  Today, pt reports ***   Dx testing: 06/19/22 Neuro psych evaluation 10/10/20 Brain MRI             06/27/20 Head & maxillofacial CT  Pertinent review of systems: ***  Relevant historical information: ***   Exam:  There were no vitals taken for this visit. General: Well Developed, well nourished, and in no acute distress.   MSK: ***    Lab and Radiology Results No results found for this or any previous visit (from the past 72 hour(s)). No results found.     Assessment and Plan: 57 y.o. female with ***   PDMP not reviewed this encounter. No orders of the defined types were placed in this encounter.  No orders of the defined types were placed in this encounter.    Discussed warning signs or symptoms. Please see discharge instructions. Patient expresses understanding.   ***

## 2023-05-07 ENCOUNTER — Other Ambulatory Visit: Payer: Self-pay | Admitting: Family Medicine

## 2023-05-07 ENCOUNTER — Encounter: Payer: Self-pay | Admitting: Family Medicine

## 2023-05-07 ENCOUNTER — Ambulatory Visit: Payer: 59 | Admitting: Family Medicine

## 2023-05-07 VITALS — BP 122/82 | HR 90 | Ht 61.5 in | Wt 141.0 lb

## 2023-05-07 DIAGNOSIS — F900 Attention-deficit hyperactivity disorder, predominantly inattentive type: Secondary | ICD-10-CM

## 2023-05-07 DIAGNOSIS — F0781 Postconcussional syndrome: Secondary | ICD-10-CM | POA: Diagnosis not present

## 2023-05-07 DIAGNOSIS — F411 Generalized anxiety disorder: Secondary | ICD-10-CM | POA: Diagnosis not present

## 2023-05-07 NOTE — Patient Instructions (Addendum)
Thank you for coming in today.   Try allergy medicines. Zyrtec or Zyrtec-D in the morning is ok.  OK to use nasal spray like Flonase if it gets bad.   Start Zoloft today.   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   Let me know what you need with a letter for work.

## 2023-05-10 MED ORDER — DULOXETINE HCL 20 MG PO CPEP: 20.0000 mg | ORAL_CAPSULE | Freq: Every day | ORAL | 1 refills | Status: DC

## 2023-05-10 NOTE — Telephone Encounter (Signed)
I received Tracy Weiss's [facility] report.  I will make sure to get scanned into the computer.  Thank finding is significant drug interactions with bupropion and sertraline.  I switched her from sertraline to duloxetine.  I called and went over the report with her.

## 2023-05-23 NOTE — Progress Notes (Signed)
I, Stevenson Clinch, CMA acting as a scribe for [name], MD.  Tracy Weiss is a 57 y.o. female who presents to [facility] at [facility] today for 37-month f/u post-concussion syndrome, w/ anxiety and ADHD. Pt was last seen by Dr. Denyse Amass on 05/07/23 and she was encouraged to apply for different roles for her work and to pick up her Zoloft from the pharmacy.  Today, pt reports difficulty getting Cymbalta, was contacted by the pharmacy today to pick up rx. Continued HA, pressure, like something is squeezing her head, Sx are intermittent. Unsure what is triggering HA. Has established with PCP at [facility].   She notes continued ongoing headache.  She did have a brain MRI February 2022 that did show some white matter lesions.  She is worried that her continued ongoing and now worsening headache may be related to more significant findings.  Dx testing: 04/05/23 GeneSight test 06/19/22 Neuro psych evaluation 10/10/20 Brain MRI             06/27/20 Head & maxillofacial CT  Pertinent review of systems: No fevers or chills  Relevant historical information: Diabetes   Exam:  BP 112/78   Pulse 88   Ht 5' 1.5" (1.562 m)   Wt 141 lb (64 kg)   SpO2 100%   BMI 26.21 kg/m  General: Well Developed, well nourished, and in no acute distress.   Neuropsych: Alert and oriented normal coordination.  Normal balance.  Normal speech thought process and affect.    Lab and Radiology Results  Brain MRI 10/10/20 IMPRESSION: 1. No acute intracranial abnormality. 2. A few scattered foci of T2 hyperintensity within the white matter of the cerebral hemispheres are nonspecific but may be seen in the setting of chronic microvascular ischemic disease. 3. Minimal bilateral mastoid effusion.     Electronically Signed   By: Baldemar Lenis M.D.   On: 10/10/2020 12:36    Assessment and Plan: 57 y.o. female with postconcussion syndrome.  Mood and cognition have continued to  be a challenge.  She had a [facility] test recently at the suggested that Cymbalta would be a good option.  I prescribed Cymbalta at the last visit but she has been unable to get it due to some pharmacy problems until just now.  She will be able to pick up the Cymbalta today and start taking it today.  Encouraged her to take it at bedtime.  Chronic worsening headache: This has been an ongoing issue arising from her original concussion over 2 and half years ago.  MRI did show some white matter lesions.  Reasonable to repeat brain MRI now that it has been quite a while to make sure that her worsening headache is unrelated to anything we can see on MRI.  Recheck in 1 month or following MRI report.   PDMP not reviewed this encounter. Orders Placed This Encounter  Procedures   MR BRAIN WO CONTRAST    Standing Status:   Future    Standing Expiration Date:   05/23/2024    Order Specific Question:   What is the patient's sedation requirement?    Answer:   No Sedation    Order Specific Question:   Does the patient have a pacemaker or implanted devices?    Answer:   No    Order Specific Question:   Preferred imaging location?    Answer:   [facility] (table limit-550lbs)   No orders of the defined types were  placed in this encounter.    Discussed warning signs or symptoms. Please see discharge instructions. Patient expresses understanding.   The above documentation has been reviewed and is accurate and complete [name], M.D.

## 2023-05-24 ENCOUNTER — Encounter: Payer: Self-pay | Admitting: Family Medicine

## 2023-05-24 ENCOUNTER — Ambulatory Visit: Payer: 59 | Admitting: Family Medicine

## 2023-05-24 VITALS — BP 112/78 | HR 88 | Ht 61.5 in | Wt 141.0 lb

## 2023-05-24 DIAGNOSIS — G8929 Other chronic pain: Secondary | ICD-10-CM | POA: Diagnosis not present

## 2023-05-24 DIAGNOSIS — F0781 Postconcussional syndrome: Secondary | ICD-10-CM | POA: Diagnosis not present

## 2023-05-24 DIAGNOSIS — R519 Headache, unspecified: Secondary | ICD-10-CM | POA: Diagnosis not present

## 2023-05-24 NOTE — Patient Instructions (Signed)
Thank you for coming in today.   START cymbalta (usually at bedtime).   You should hear from MRI scheduling within 1 week. If you do not hear please let me know.    Try a heating pad on the back of the neck.   Let me know if this is not working.   Recheck once we get the results of the MRI back or in 1 month.

## 2023-05-27 ENCOUNTER — Telehealth: Payer: Self-pay

## 2023-05-27 NOTE — Telephone Encounter (Signed)
Pt would like a call back today, after clinic is OK.

## 2023-05-27 NOTE — Telephone Encounter (Signed)
Called and spoke with patient;  Spoke with patient, she will have to report to her job on 05/31/23.   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   Mental capacity  Able to process information and make sound judgement  She would like to have this information ready by Wednesday if possible since she will be returning to work the following Monday.

## 2023-05-29 ENCOUNTER — Ambulatory Visit
Admission: RE | Admit: 2023-05-29 | Discharge: 2023-05-29 | Disposition: A | Discharge status: Home / Self Care | Payer: 59 | Source: Ambulatory Visit | Attending: Family Medicine | Admitting: Family Medicine

## 2023-05-29 DIAGNOSIS — G8929 Other chronic pain: Secondary | ICD-10-CM

## 2023-05-29 DIAGNOSIS — F0781 Postconcussional syndrome: Secondary | ICD-10-CM

## 2023-05-29 NOTE — Telephone Encounter (Signed)
Patient called about the below message. She would like a call back in regards.  Please advise.

## 2023-05-29 NOTE — Telephone Encounter (Signed)
Letter faxed and e-mailed to contacts provided by pt below.

## 2023-05-29 NOTE — Telephone Encounter (Signed)
Letter printed and placed on Dr. Zollie Pee desk to review and sign.

## 2023-05-31 ENCOUNTER — Telehealth: Payer: Self-pay

## 2023-05-31 NOTE — Telephone Encounter (Signed)
Patient called and was given the response of the MRI results but she would like Dr. Denyse Amass to call her back because She has no idea what the T2 is and I don't either so I didn't have any information to give her.

## 2023-05-31 NOTE — Progress Notes (Signed)
Brain MRI is stable from 2-1/2 years ago.  No acute changes.  The small T2 spots look stable.

## 2023-06-03 NOTE — Telephone Encounter (Signed)
Spoke with patient and reviewed MRI results. Pt verbalized understanding.   Pt notes that she did return to work today but they told her that they did not have accommodations in place from HR and that she should remain home until they have accommodations in place. She will reach out if any additional information is needed from the office.   Pt also notes that she has started on new mediation and so far seems to be tolerating well. She will keep [name] posted.

## 2023-06-21 ENCOUNTER — Ambulatory Visit (INDEPENDENT_AMBULATORY_CARE_PROVIDER_SITE_OTHER): Payer: 59 | Admitting: Family Medicine

## 2023-06-21 ENCOUNTER — Encounter: Payer: Self-pay | Admitting: Family Medicine

## 2023-06-21 VITALS — BP 112/76 | HR 99 | Ht 61.5 in | Wt 138.8 lb

## 2023-06-21 DIAGNOSIS — F419 Anxiety disorder, unspecified: Secondary | ICD-10-CM

## 2023-06-21 DIAGNOSIS — F0781 Postconcussional syndrome: Secondary | ICD-10-CM

## 2023-06-21 DIAGNOSIS — F900 Attention-deficit hyperactivity disorder, predominantly inattentive type: Secondary | ICD-10-CM

## 2023-06-21 MED ORDER — ALPRAZOLAM 0.25 MG PO TABS: 0.2500 mg | ORAL_TABLET | Freq: Two times a day (BID) | ORAL | 1 refills | Status: DC | PRN

## 2023-06-21 NOTE — Progress Notes (Signed)
   I, Stevenson Clinch, CMA acting as a scribe for [name], MD.  Tracy Weiss is a 57 y.o. female who presents to [facility] at [facility] today for 71-month f/u post-concussion syndrome and HA w/ brain MRI review. Pt was last seen by Dr. Denyse Amass on 05/24/23 and she was advised to start taking the Cymbalta and a repeat brain MRI was ordered.  Today, pt reports some improvement of cognitive sx while at work and taking Duloxetine, though she feels that when she takes it while she is not working, it causes more anxiety. She would like to discuss prn meds.They have not found another job for her and seem less willing to work with her accommodations.   Dx testing: 05/29/23 Brain MRI 04/05/23 GeneSight test 06/19/22 Neuro psych evaluation 10/10/20 Brain MRI             06/27/20 Head & maxillofacial CT  Pertinent review of systems: No fevers or chills  Relevant historical information: Hypertension anxiety ADHD diabetes   Exam:  BP 112/76   Pulse 99   Ht 5' 1.5" (1.562 m)   Wt 138 lb 12.8 oz (63 kg)   SpO2 99%   BMI 25.80 kg/m  General: Well Developed, well nourished, and in no acute distress.   Neuropsych: Alert and oriented.  Normal coordination.  Normal speech thought process and affect.     Assessment and Plan: 57 y.o. female with anxiety and ADHD related to concussion.  Anxiety it is an issue I been treating recently.  We have been trying SSRIs and SNRIs.  Previously she is taking sertraline which she did not tolerate, fluoxetine which caused sexual side effects, and most recently duloxetine which worked okay but she finds to be more annoying than helpful.  In the past she has done really well with Xanax low-dose.  We talked about strategies and options.  Will give Xanax twice daily as needed at 0.25 mg a try for 1 month and reassess.  If she has to take it frequently would like to use a controller medicine probably Pristiq or Viibryd based on GeneSight testing.   Reassess in 1 month.  As for work status attempted to clarify work situation and accommodations.   PDMP reviewed during this encounter. No orders of the defined types were placed in this encounter.  Meds ordered this encounter  Medications   ALPRAZolam (XANAX) 0.25 MG tablet    Sig: Take 1 tablet (0.25 mg total) by mouth 2 (two) times daily as needed for anxiety.    Dispense:  60 tablet    Refill:  1     Discussed warning signs or symptoms. Please see discharge instructions. Patient expresses understanding.   The above documentation has been reviewed and is accurate and complete [name], M.D. Total encounter time 30 minutes including face-to-face time with the patient and, reviewing past medical record, and charting on the date of service.

## 2023-06-21 NOTE — Patient Instructions (Signed)
Thank you for coming in today.   STOP CYMBALTA.   START xanax up to 2x daily as needed.   Recheck in 1 month.

## 2023-06-25 ENCOUNTER — Telehealth: Payer: Self-pay

## 2023-06-25 NOTE — Telephone Encounter (Signed)
Called and spoke with patient regarding workplace accommodations.   A new letter will be drafted.

## 2023-06-26 NOTE — Telephone Encounter (Signed)
Spoke with patient yesterday regarding letter, will work on 1st draft today.

## 2023-07-03 ENCOUNTER — Other Ambulatory Visit: Payer: Self-pay | Admitting: Family Medicine

## 2023-07-03 DIAGNOSIS — E119 Type 2 diabetes mellitus without complications: Secondary | ICD-10-CM

## 2023-07-10 ENCOUNTER — Telehealth: Payer: Self-pay

## 2023-07-10 NOTE — Telephone Encounter (Signed)
Accommodations letter has been e-mailed, pt has been copied on the e-mail. Called pt and advised.

## 2023-07-10 NOTE — Telephone Encounter (Signed)
Patient called and wanted to make sure that the accomodation letter has been emailed to  [email or web address]  And wants to know if it can be sent again just in case because she has not heard anything from anyone about receiving the letter.   She would like a call back so she know when it is done so she can contact the second party

## 2023-07-12 ENCOUNTER — Other Ambulatory Visit: Payer: Self-pay | Admitting: Family Medicine

## 2023-07-12 DIAGNOSIS — E119 Type 2 diabetes mellitus without complications: Secondary | ICD-10-CM

## 2023-07-31 ENCOUNTER — Encounter: Payer: Self-pay | Admitting: Family Medicine

## 2023-08-05 ENCOUNTER — Encounter: Payer: Self-pay | Admitting: Family Medicine

## 2023-08-05 ENCOUNTER — Ambulatory Visit: Payer: 59 | Admitting: Family Medicine

## 2023-08-05 VITALS — BP 130/82 | HR 88 | Ht 61.5 in | Wt 153.0 lb

## 2023-08-05 DIAGNOSIS — F0781 Postconcussional syndrome: Secondary | ICD-10-CM | POA: Diagnosis not present

## 2023-08-05 DIAGNOSIS — F419 Anxiety disorder, unspecified: Secondary | ICD-10-CM

## 2023-08-05 MED ORDER — ALPRAZOLAM 0.25 MG PO TABS: 0.2500 mg | ORAL_TABLET | Freq: Two times a day (BID) | ORAL | 4 refills | Status: AC | PRN

## 2023-08-05 NOTE — Patient Instructions (Addendum)
Thank you for coming in today.   Return around Jan 18th.   Let me or Gearldine Bienenstock know if we need to change that letter.  Continue Xanax.

## 2023-08-05 NOTE — Progress Notes (Signed)
   I, Stevenson Clinch, CMA acting as a scribe for [name], MD.  Tracy Weiss is a 57 y.o. female who presents to [facility] at [facility] today to discuss return to work. Pt was last seen by Dr. Denyse Amass on 06/21/23 and was prescribed Xanax and they discussed her work situation.  Today, pt reports returning to work on 07/22/23. Still requires the following: Low traffic volume area, noise cancelling headphones, work 1 day from home, continue to seek lateral transition, continued light case load; highly recommended medically. Notes increased fatigue after returning to work full time. Has not had a case alone yet so would like to be re-evaluated in 1 month.     Pertinent review of systems: No fevers or chills  Relevant historical information: Anxiety   Exam:  BP 130/82   Pulse 88   Ht 5' 1.5" (1.562 m)   Wt 153 lb (69.4 kg)   SpO2 97%   BMI 28.44 kg/m  General: Well Developed, well nourished, and in no acute distress.   Neuropsych: Alert and oriented normal speech thought process and affect.     Assessment and Plan: 57 y.o. female with postconcussion syndrome complicated by anxiety.  She has returned to work but I do very limited capacity.  Hopefully this will be doable in the long run.  New work note written today.  Continue alprazolam.  Her speech therapist wanted to see her back after she returns to work.  New referral to speech therapy for cognitive rehab ordered today.   PDMP not reviewed this encounter. Orders Placed This Encounter  Procedures   Ambulatory referral to Speech Therapy    Referral Priority:   Routine    Referral Type:   Speech Therapy    Referral Reason:   Specialty Services Required    Requested Specialty:   Speech Pathology    Number of Visits Requested:   1   Meds ordered this encounter  Medications   ALPRAZolam (XANAX) 0.25 MG tablet    Sig: Take 1 tablet (0.25 mg total) by mouth 2 (two) times daily as needed for anxiety.     Dispense:  60 tablet    Refill:  4     Discussed warning signs or symptoms. Please see discharge instructions. Patient expresses understanding.   The above documentation has been reviewed and is accurate and complete [name], M.D.

## 2023-08-29 ENCOUNTER — Telehealth: Payer: Self-pay

## 2023-08-29 NOTE — Telephone Encounter (Signed)
ADA workplace accomodation  Change to different position within the same company.   Call pt to discuss.   E-mail form [place] [email or web address]

## 2023-09-02 NOTE — Telephone Encounter (Signed)
Called pt, left VM to call the office.  

## 2023-09-02 NOTE — Telephone Encounter (Signed)
Spoke with patient and reviewed Accomodation request form.   Pt feels that she is able to work full-time, is able to complete all job tasks with accommodations.   Form completed and placed on Dr. Zollie Pee desk to review and sign.

## 2023-09-12 ENCOUNTER — Other Ambulatory Visit: Payer: Self-pay | Admitting: Physician Assistant

## 2023-09-12 DIAGNOSIS — Z0000 Encounter for general adult medical examination without abnormal findings: Secondary | ICD-10-CM

## 2023-09-13 NOTE — Progress Notes (Signed)
   LILLETTE Ileana Collet, PhD, LAT, ATC acting as a scribe for Artist Lloyd, MD.  Tracy Weiss is a 58 y.o. female who presents to [facility] at [facility] today for f/u post-concussion syndrome, anxiety, and ADHD. Pt was last seen by Dr. Lloyd on 08/05/23 and a new work note was provided and she was advised to cont alprazolam . She was also re-referred to speech therapy, but has not yet scheduled any visits.  Today, pt reports she has cont'd rx. She is starting a new position today at work. She feels like she is going to re-start her Vyvanse . She is only needing to taking her rx sporadically.   She does have alprazolam  for anxiety but has only required it 4 times in last 1 month.  Pertinent review of systems: No fevers or chills  Relevant historical information: ADHD No longer is established with [facility] or her former PCP.  Exam:  BP (!) 148/88   Pulse 87   Ht 5' 1.5 (1.562 m)   Wt 152 lb (68.9 kg)   SpO2 99%   BMI 28.26 kg/m  General: Well Developed, well nourished, and in no acute distress.   Neuropsych: Alert and oriented normal speech thought process and affect.     Assessment and Plan: 58 y.o. female with continued postconcussion symptoms primarily now attention/ADHD and anxiety.  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  Plan to switch back to Vyvanse  as previously prescribed 70 mg dose.  She will let me know how she is feeling and I will refill it if it works okay.  Will refill alprazolam  if needed.  Recheck back with me in 3 months or sooner if needed.   PDMP reviewed during this encounter. No orders of the defined types were placed in this encounter.  Meds ordered this encounter  Medications   lisdexamfetamine (VYVANSE ) 70 MG capsule    Sig: Take 1 capsule (70 mg total) by mouth daily.     Dispense:  30 capsule    Refill:  0     Discussed warning signs or symptoms. Please see discharge instructions. Patient expresses understanding.   The above documentation has been reviewed and is accurate and complete Artist Lloyd, M.D.

## 2023-09-16 ENCOUNTER — Ambulatory Visit: Payer: 59 | Admitting: Family Medicine

## 2023-09-16 VITALS — BP 148/88 | HR 87 | Ht 61.5 in | Wt 152.0 lb

## 2023-09-16 DIAGNOSIS — F419 Anxiety disorder, unspecified: Secondary | ICD-10-CM | POA: Diagnosis not present

## 2023-09-16 DIAGNOSIS — F0781 Postconcussional syndrome: Secondary | ICD-10-CM | POA: Diagnosis not present

## 2023-09-16 DIAGNOSIS — F900 Attention-deficit hyperactivity disorder, predominantly inattentive type: Secondary | ICD-10-CM

## 2023-09-16 MED ORDER — LISDEXAMFETAMINE DIMESYLATE 70 MG PO CAPS: 70.0000 mg | ORAL_CAPSULE | Freq: Every day | ORAL | 0 refills | Status: DC

## 2023-09-16 NOTE — Patient Instructions (Addendum)
 Thank you for coming in today.   Back to Vyvanse. Let me know if you have trouble getting it.   Recheck in 3 months if all is well. If you have trouble let me know.   Congrats getting back to work.

## 2023-09-23 ENCOUNTER — Other Ambulatory Visit: Payer: Self-pay

## 2023-09-23 ENCOUNTER — Other Ambulatory Visit (HOSPITAL_COMMUNITY): Payer: Self-pay

## 2023-09-23 MED ORDER — LISDEXAMFETAMINE DIMESYLATE 70 MG PO CAPS
70.0000 mg | ORAL_CAPSULE | Freq: Every day | ORAL | 0 refills | Status: DC
  Filled 2023-09-23: qty 30, 30d supply, fill #0

## 2023-09-23 NOTE — Telephone Encounter (Signed)
Fax received from [facility] pharmacy:  Lisdexamphetamine 70 mg on back order  Called pt, would like to have rx sent to [facility].   Rx pending, forwarding to Dr. Denyse Amass.

## 2023-09-25 ENCOUNTER — Other Ambulatory Visit: Payer: Self-pay | Admitting: Family Medicine

## 2023-09-25 ENCOUNTER — Other Ambulatory Visit (HOSPITAL_COMMUNITY): Payer: Self-pay

## 2023-10-24 ENCOUNTER — Other Ambulatory Visit (HOSPITAL_COMMUNITY): Payer: Self-pay

## 2023-10-24 ENCOUNTER — Other Ambulatory Visit: Payer: Self-pay

## 2023-10-24 ENCOUNTER — Other Ambulatory Visit: Payer: Self-pay | Admitting: Family Medicine

## 2023-10-24 MED ORDER — LISDEXAMFETAMINE DIMESYLATE 70 MG PO CAPS
70.0000 mg | ORAL_CAPSULE | Freq: Every day | ORAL | 0 refills | Status: DC
Start: 2023-10-24 — End: 2023-12-09
  Filled 2023-10-24: qty 30, 30d supply, fill #0

## 2023-11-07 ENCOUNTER — Other Ambulatory Visit (HOSPITAL_COMMUNITY): Payer: Self-pay

## 2023-11-14 ENCOUNTER — Ambulatory Visit
Admission: RE | Admit: 2023-11-14 | Discharge: 2023-11-14 | Disposition: A | Discharge status: Home / Self Care | Payer: 59 | Source: Ambulatory Visit | Attending: Physician Assistant | Admitting: Physician Assistant

## 2023-11-14 DIAGNOSIS — Z0000 Encounter for general adult medical examination without abnormal findings: Secondary | ICD-10-CM

## 2023-12-09 ENCOUNTER — Other Ambulatory Visit (HOSPITAL_COMMUNITY): Payer: Self-pay

## 2023-12-09 ENCOUNTER — Ambulatory Visit: Payer: 59 | Admitting: Family Medicine

## 2023-12-09 VITALS — BP 124/82 | HR 86 | Ht 61.5 in | Wt 143.0 lb

## 2023-12-09 DIAGNOSIS — F0781 Postconcussional syndrome: Secondary | ICD-10-CM

## 2023-12-09 DIAGNOSIS — J302 Other seasonal allergic rhinitis: Secondary | ICD-10-CM | POA: Diagnosis not present

## 2023-12-09 DIAGNOSIS — F900 Attention-deficit hyperactivity disorder, predominantly inattentive type: Secondary | ICD-10-CM

## 2023-12-09 DIAGNOSIS — F419 Anxiety disorder, unspecified: Secondary | ICD-10-CM | POA: Diagnosis not present

## 2023-12-09 MED ORDER — LISDEXAMFETAMINE DIMESYLATE 70 MG PO CAPS
70.0000 mg | ORAL_CAPSULE | Freq: Every day | ORAL | 0 refills | Status: DC
  Filled 2023-12-09 – 2024-01-01 (×2): qty 30, 30d supply, fill #0

## 2023-12-09 NOTE — Progress Notes (Signed)
   [name], PhD, LAT, ATC acting as a scribe for [name], MD.  Tracy Weiss is a 58 y.o. female who presents to [facility] at [facility] today for  f/u post-concussion syndrome, anxiety, and ADHD. Pt was last seen by Dr. Denyse Amass on 09/16/23 and she was switched back to Vyvanse and her alprazolam was refilled.  Today, pt reports she the Vyvanse has been good. She notes taking the alprazolam about 3 x/ wk. She notes she will need her work note adjusted. 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   She notes congestion and runny nose and itchy watery eyes.  She has been using Zyrtec-D but that makes her tired.  Pertinent review of systems: No fevers or chills  Relevant historical information: ADHD and controlled diabetes.   Exam:  BP 124/82   Pulse 86   Ht 5' 1.5" (1.562 m)   Wt 143 lb (64.9 kg)   SpO2 99%   BMI 26.58 kg/m  General: Well Developed, well nourished, and in no acute distress.   HEENT: Nasal congestion and runny nose.  Mild erythematous conjunctiva.  Neuropsych alert and oriented normal speech thought process and affect.       Assessment and Plan: 58 y.o. female with postconcussion syndrome complicated by ADHD and anxiety.  Doing pretty well on Vyvanse and intermittent low-dose alprazolam.  Plan to continue Vyvanse and alprazolam.  Recheck in 3 months for this.  As for nasal congestion and itchy watery eyes symptoms are consistent with seasonal allergies allergic rhinitis and conjunctivitis.  It seems as though Zyrtec is making her fatigued which can happen.  Try switching to Claritin or Allegra and using an antihistamine eyedrop.  If needed also add Flonase nasal spray.  All of these are available generically over-the-counter.   PDMP not reviewed this encounter. No orders of the defined types were placed in this encounter.  Meds ordered this encounter  Medications   lisdexamfetamine (VYVANSE) 70 MG capsule    Sig:  Take 1 capsule (70 mg total) by mouth daily.    Dispense:  90 capsule    Refill:  0    Do not mail     Discussed warning signs or symptoms. Please see discharge instructions. Patient expresses understanding.   The above documentation has been reviewed and is accurate and complete [name], M.D.

## 2023-12-09 NOTE — Patient Instructions (Addendum)
 Thank you for coming in today.   Try allegra or Claritin generic over the counter.   Try Platinol and Patiday generic OTC eye drops  If that is not enough try generic Flonase or nasonsex nasal spray. It will take a few days to work.   3 Months.

## 2023-12-19 ENCOUNTER — Other Ambulatory Visit (HOSPITAL_COMMUNITY): Payer: Self-pay

## 2024-01-01 ENCOUNTER — Other Ambulatory Visit: Payer: Self-pay

## 2024-01-01 ENCOUNTER — Other Ambulatory Visit (HOSPITAL_COMMUNITY): Payer: Self-pay

## 2024-02-04 ENCOUNTER — Other Ambulatory Visit (HOSPITAL_COMMUNITY): Payer: Self-pay

## 2024-02-04 ENCOUNTER — Other Ambulatory Visit: Payer: Self-pay

## 2024-02-04 ENCOUNTER — Other Ambulatory Visit: Payer: Self-pay | Admitting: Family Medicine

## 2024-02-13 ENCOUNTER — Other Ambulatory Visit (HOSPITAL_COMMUNITY): Payer: Self-pay

## 2024-02-13 MED ORDER — LISDEXAMFETAMINE DIMESYLATE 70 MG PO CAPS
70.0000 mg | ORAL_CAPSULE | Freq: Every day | ORAL | 0 refills | Status: DC
  Filled 2024-02-13: qty 30, 30d supply, fill #0

## 2024-02-18 ENCOUNTER — Other Ambulatory Visit (HOSPITAL_COMMUNITY): Payer: Self-pay

## 2024-03-09 ENCOUNTER — Telehealth: Payer: Self-pay

## 2024-03-09 ENCOUNTER — Other Ambulatory Visit: Payer: Self-pay

## 2024-03-09 ENCOUNTER — Other Ambulatory Visit (HOSPITAL_COMMUNITY): Payer: Self-pay

## 2024-03-09 ENCOUNTER — Ambulatory Visit: Admitting: Family Medicine

## 2024-03-09 VITALS — BP 112/82 | HR 77 | Ht 61.5 in | Wt 141.0 lb

## 2024-03-09 DIAGNOSIS — F900 Attention-deficit hyperactivity disorder, predominantly inattentive type: Secondary | ICD-10-CM | POA: Diagnosis not present

## 2024-03-09 DIAGNOSIS — F0781 Postconcussional syndrome: Secondary | ICD-10-CM | POA: Diagnosis not present

## 2024-03-09 MED ORDER — LISDEXAMFETAMINE DIMESYLATE 30 MG PO CAPS
30.0000 mg | ORAL_CAPSULE | Freq: Every day | ORAL | 0 refills | Status: DC
  Filled 2024-03-09 – 2024-04-22 (×2): qty 30, 30d supply, fill #0

## 2024-03-09 MED ORDER — VYZULTA 0.024 % OP SOLN
1.0000 [drp] | Freq: Every evening | OPHTHALMIC | 11 refills | Status: AC
  Filled 2024-03-09: qty 2.5, 25d supply, fill #0

## 2024-03-09 MED ORDER — METHYLPHENIDATE HCL ER (CD) 30 MG PO CPCR
30.0000 mg | ORAL_CAPSULE | ORAL | 0 refills | Status: AC
  Filled 2024-03-09 – 2024-04-22 (×2): qty 30, 30d supply, fill #0

## 2024-03-09 NOTE — Patient Instructions (Addendum)
 Thank you for coming in today.   We are switching the ADHD meds.   I do recommend getting back in with [facility] and back with Chelle.   Recheck with me in 3 month.   Try speech recognition software.

## 2024-03-09 NOTE — Progress Notes (Signed)
   I, Leotis Batter, CMA acting as a scribe for Artist Lloyd, MD.  Tracy Weiss is a 58 y.o. female who presents to [facility] at [facility] today for 46-month f/u post-concussion syndrome, anxiety, and ADHD. Pt was last seen by Dr. Lloyd on 12/09/23 and was advised to continue Vyvanse  and alprazolam .  Today, pt reports being back to work full time with intermittent time off to attend appointments and for flare-ups, valid through 09/02/25. Continues to need accomodation with overhead lighting. Needs written over computer documentation, epscially during training, struggles when having to look up and down at power point and take notes. Needs flexibility with arrival time to work, but continuing to work the same number of hours. Would also like extension for work due. Continued time off to attend appointments. Feels like Vyvanse  isn't working as well.  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  She notes the Vyvanse  takes a long time to work.  Previously she was on methylphenidate  and Vyvanse  previously prescribed by [facility].  Pertinent review of systems: No fevers or chills  Relevant historical information: ADHD, postconcussion syndrome, diabetes.   Exam:  BP 112/82   Pulse 77   Ht 5' 1.5 (1.562 m)   Wt 141 lb (64 kg)   SpO2 100%   BMI 26.21 kg/m  General: Well Developed, well nourished, and in no acute distress.   Neuropsych: Alert and oriented normal speech thought process and affect.      Assessment and Plan: 58 y.o. female with postconcussion syndrome complicated by ADHD.  Overall doing okay but not optimized.  Ready go back to the combo of extended release methylphenidate  and Vyvanse  that she previously was on.  Recommend following up with [facility] and PCP.  Check back with me in 3 months.  Workplace accommodations renewed today.  Recommended trying speech recognition software which could be helpful.   PDMP  reviewed during this encounter. No orders of the defined types were placed in this encounter.  Meds ordered this encounter  Medications   lisdexamfetamine (VYVANSE ) 30 MG capsule    Sig: Take 1 capsule (30 mg total) by mouth daily.    Dispense:  30 capsule    Refill:  0   methylphenidate  (METADATE  CD) 30 MG CR capsule    Sig: Take 1 capsule (30 mg total) by mouth every morning.    Dispense:  30 capsule    Refill:  0     Discussed warning signs or symptoms. Please see discharge instructions. Patient expresses understanding.   The above documentation has been reviewed and is accurate and complete Artist Lloyd, M.D.

## 2024-03-09 NOTE — Telephone Encounter (Signed)
 FMLA / Accomodation

## 2024-03-12 NOTE — Telephone Encounter (Signed)
 Called and spoke with patient.   E-mailed case worker to request blank workplace accommodation form. Pt was copied on this e-mailed request.

## 2024-03-12 NOTE — Telephone Encounter (Signed)
 Tracy Weiss wants you to call her

## 2024-03-13 NOTE — Telephone Encounter (Signed)
 Workplace accommodation forms received.

## 2024-03-17 NOTE — Telephone Encounter (Signed)
Form completed and placed on Dr. Corey's desk to review and sign.  

## 2024-03-18 ENCOUNTER — Emergency Department (HOSPITAL_BASED_OUTPATIENT_CLINIC_OR_DEPARTMENT_OTHER)
Admission: EM | Admit: 2024-03-18 | Discharge: 2024-03-18 | Disposition: A | Discharge status: Home / Self Care | Attending: Emergency Medicine | Admitting: Emergency Medicine

## 2024-03-18 ENCOUNTER — Other Ambulatory Visit (HOSPITAL_BASED_OUTPATIENT_CLINIC_OR_DEPARTMENT_OTHER): Payer: Self-pay

## 2024-03-18 ENCOUNTER — Other Ambulatory Visit: Payer: Self-pay

## 2024-03-18 ENCOUNTER — Emergency Department (HOSPITAL_BASED_OUTPATIENT_CLINIC_OR_DEPARTMENT_OTHER): Admitting: Radiology

## 2024-03-18 DIAGNOSIS — H66002 Acute suppurative otitis media without spontaneous rupture of ear drum, left ear: Secondary | ICD-10-CM | POA: Diagnosis not present

## 2024-03-18 DIAGNOSIS — R079 Chest pain, unspecified: Secondary | ICD-10-CM | POA: Insufficient documentation

## 2024-03-18 DIAGNOSIS — M546 Pain in thoracic spine: Secondary | ICD-10-CM | POA: Diagnosis not present

## 2024-03-18 DIAGNOSIS — H9202 Otalgia, left ear: Secondary | ICD-10-CM | POA: Diagnosis present

## 2024-03-18 LAB — BASIC METABOLIC PANEL WITH GFR
Anion gap: 11 (ref 5–15)
BUN: 10 mg/dL (ref 6–20)
CO2: 26 mmol/L (ref 22–32)
Calcium: 10.4 mg/dL — ABNORMAL HIGH (ref 8.9–10.3)
Chloride: 102 mmol/L (ref 98–111)
Creatinine, Ser: 0.83 mg/dL (ref 0.44–1.00)
GFR, Estimated: >60 mL/min (ref >60)
Glucose, Bld: 166 mg/dL — ABNORMAL HIGH (ref 70–99)
Potassium: 3.8 mmol/L (ref 3.5–5.1)
Sodium: 139 mmol/L (ref 135–145)

## 2024-03-18 LAB — CBC
HCT: 37.4 % (ref 36.0–46.0)
Hemoglobin: 12.3 g/dL (ref 12.0–15.0)
MCH: 28.8 pg (ref 26.0–34.0)
MCHC: 32.9 g/dL (ref 30.0–36.0)
MCV: 87.6 fL (ref 80.0–100.0)
Platelets: 309 K/uL (ref 150–400)
RBC: 4.27 MIL/uL (ref 3.87–5.11)
RDW: 12.5 % (ref 11.5–15.5)
WBC: 4.3 K/uL (ref 4.0–10.5)
nRBC: 0 % (ref 0.0–0.2)

## 2024-03-18 LAB — TROPONIN T, HIGH SENSITIVITY: Troponin T High Sensitivity: <15 ng/L (ref <19)

## 2024-03-18 MED ORDER — AMOXICILLIN 500 MG PO CAPS
1000.0000 mg | ORAL_CAPSULE | Freq: Two times a day (BID) | ORAL | 0 refills | Status: AC
  Filled 2024-03-18: qty 40, 10d supply, fill #0

## 2024-03-18 NOTE — Discharge Instructions (Addendum)
 Your test here are reassuring.  It is unlikely that this is a heart attack.  Please return if you have sudden worsening pain especially with exercise if you cough up blood or if you pass out.  Please follow-up with your family doctor in the office.  Your left ear looks like it might be infected.  I have started you on antibiotics.  Please take them as prescribed.  Take tylenol  2 pills 4 times a day and motrin  4 pills 3 times a day.  Drink plenty of fluids.  Return for worsening shortness of breath, headache, confusion. Follow up with your family doctor.

## 2024-03-18 NOTE — ED Triage Notes (Signed)
 C/o central cp that rads down into lower back. Endorses SHOB with exertion.

## 2024-03-18 NOTE — ED Provider Notes (Signed)
 [facility] AT [facility] Provider Note   CSN: [id number] Arrival date & time: 03/18/24  1450     Patient presents with: Chest Pain   Tracy Weiss is a 58 y.o. female.   58 yo F with a chief complaint of left ear pain left-sided chest pain and thoracic back pain.  Has been going on for about 3 to 4 days now.  Pain has been persistent though comes and goes in severity.  Nothing seems to make it better or worse.  She denies cough congestion or fever.  Denies trauma.  Denies one-sided numbness or weakness denies difficulty speech or swallowing.  She does have a history of a concussion and has suffered with symptoms since.  Going on for about 5 years now.   Chest Pain      Prior to Admission medications   Medication Sig Start Date End Date Taking? Authorizing Provider  amoxicillin  (AMOXIL ) 500 MG capsule Take 2 capsules (1,000 mg total) by mouth 2 (two) times daily. 03/18/24  Yes Emil Share, DO  ALPRAZolam  (XANAX ) 0.25 MG tablet Take 1 tablet (0.25 mg total) by mouth 2 (two) times daily as needed for anxiety. 08/05/23   Corey, Evan S, MD  Ascorbic Acid (VITAMIN C) 1000 MG tablet Take 1,000 mg by mouth daily.    [provider]  Cholecalciferol (VITAMIN D ) 50 MCG (2000 UT) CAPS Take 1 capsule by mouth daily.    [provider]  Latanoprostene Bunod  (VYZULTA ) 0.024 % SOLN Place 1 drop into both eyes at bedtime. 03/09/24     lisdexamfetamine (VYVANSE ) 30 MG capsule Take 1 capsule (30 mg total) by mouth daily. 03/09/24   Joane Artist RAMAN, MD  methylphenidate  (METADATE  CD) 30 MG CR capsule Take 1 capsule (30 mg total) by mouth every morning. 03/09/24   Joane Artist RAMAN, MD  olmesartan  (BENICAR ) 5 MG tablet Take 1 tablet (5 mg total) by mouth daily. 10/12/22   Lilland, Alana, DO  Prenatal Vit-Fe Fumarate-FA (PRENATAL VITAMIN PO) Take 1 tablet by mouth.     [provider]  rosuvastatin  (CRESTOR ) 20 MG tablet Take 1 tablet (20 mg total) by mouth daily.  11/27/22   McDiarmid, Krystal BIRCH, MD  XIIDRA 5 % SOLN Place 1 drop into both eyes in the morning and at bedtime. 10/30/22   [provider]  Zinc  100 MG TABS Take 1 tablet by mouth daily.    [provider]    Allergies: Patient has no known allergies.    Review of Systems  Cardiovascular:  Positive for chest pain.    Updated Vital Signs BP 133/88   Pulse 82   Temp 98.3 F (36.8 C)   Resp 18   SpO2 100%   Physical Exam Vitals and nursing note reviewed.  Constitutional:      General: She is not in acute distress.    Appearance: She is well-developed. She is not diaphoretic.  HENT:     Head: Normocephalic and atraumatic.     Comments: Left TM with purulent effusion mild erythema.  Swollen turbinates posterior nasal drip posterior oropharyngeal erythema. Eyes:     Pupils: Pupils are equal, round, and reactive to light.  Cardiovascular:     Rate and Rhythm: Normal rate and regular rhythm.     Heart sounds: No murmur heard.    No friction rub. No gallop.  Pulmonary:     Effort: Pulmonary effort is normal.     Breath sounds: No wheezing or  rales.  Abdominal:     General: There is no distension.     Palpations: Abdomen is soft.     Tenderness: There is no abdominal tenderness.  Musculoskeletal:        General: No tenderness.     Cervical back: Normal range of motion and neck supple.     Comments: Pain about the mid thoracic back.  No obvious CVA tenderness percussion.  Skin:    General: Skin is warm and dry.  Neurological:     Mental Status: She is alert and oriented to person, place, and time.     Cranial Nerves: Cranial nerves 2-12 are intact.     Sensory: Sensation is intact.     Motor: Motor function is intact.     Coordination: Coordination is intact.     Gait: Gait is intact.     Comments: Subjective decrease sensation to the right frontal region.  Otherwise benign neurologic exam.  Psychiatric:        Behavior: Behavior normal.     (all labs  ordered are listed, but only abnormal results are displayed) Labs Reviewed  BASIC METABOLIC PANEL WITH GFR - Abnormal; Notable for the following components:      Result Value   Glucose, Bld 166 (*)    Calcium  10.4 (*)    All other components within normal limits  CBC  TROPONIN T, HIGH SENSITIVITY  TROPONIN T, HIGH SENSITIVITY    EKG: EKG Interpretation Date/Time:  Wednesday March 18 2024 14:59:48 EDT Ventricular Rate:  77 PR Interval:  160 QRS Duration:  78 QT Interval:  390 QTC Calculation: 441 R Axis:   24  Text Interpretation: Normal sinus rhythm Right atrial enlargement Borderline ECG No significant change since last tracing Confirmed by Emil Share [phone]) on 03/18/2024 3:03:09 PM  Radiology: [facility] Chest 2 View Result Date: 03/18/2024 CLINICAL DATA:  Chest pain EXAM: CHEST - 2 VIEW COMPARISON:  Chest x-ray 03/02/2022 FINDINGS: The heart size and mediastinal contours are within normal limits. Both lungs are clear. The visualized skeletal structures are unremarkable. IMPRESSION: No active cardiopulmonary disease. Electronically Signed   By: Greig Pique M.D.   On: 03/18/2024 16:09     Procedures   Medications Ordered in the ED - No data to display                                  Medical Decision Making Amount and/or Complexity of Data Reviewed Labs: ordered. Radiology: ordered.  Risk Prescription drug management.   58 yo F with multiple complaints.  Complaining of left ear pain left chest pain and thoracic back pain.  Going on for about 4 days in total.  Patient does have signs and symptoms consistent with otitis media for me.  Chest pain is atypical in nature.  Going on greater than 6 hours without change.  Troponin negative feel delta is not warranted.  I feel this is completely atypical of pulmonary embolism and no further workup is warranted.  No leukocytosis no anemia, no significant electrolyte abnormalities.  Chest x-ray independently interpreted by me without  focal infiltrate or pneumothorax.  5:37 PM:  I have discussed the diagnosis/risks/treatment options with the patient.  Evaluation and diagnostic testing in the emergency department does not suggest an emergent condition requiring admission or immediate intervention beyond what has been performed at this time.  They will follow up with PCP. We also discussed returning to  the ED immediately if new or worsening sx occur. We discussed the sx which are most concerning (e.g., sudden worsening pain, fever, inability to tolerate by mouth) that necessitate immediate return. Medications administered to the patient during their visit and any new prescriptions provided to the patient are listed below.  Medications given during this visit Medications - No data to display   The patient appears reasonably screen and/or stabilized for discharge and I doubt any other medical condition or other [facility] requiring further screening, evaluation, or treatment in the ED at this time prior to discharge.       Final diagnoses:  Acute suppurative otitis media of left ear without spontaneous rupture of tympanic membrane, recurrence not specified  Nonspecific chest pain  Acute midline thoracic back pain    ED Discharge Orders          Ordered    amoxicillin  (AMOXIL ) 500 MG capsule  2 times daily        03/18/24 1610               Emil Share, DO 03/18/24 1737

## 2024-03-18 NOTE — ED Notes (Signed)
 RN reviewed discharge instructions with pt. Pt verbalized understanding and had no further questions. VSS upon discharge.

## 2024-03-18 NOTE — Telephone Encounter (Signed)
 Form reviewed and signed by Dr. Denyse Amass, placed at the front desk for faxing/scanning.

## 2024-03-20 ENCOUNTER — Other Ambulatory Visit (HOSPITAL_COMMUNITY): Payer: Self-pay

## 2024-03-23 ENCOUNTER — Other Ambulatory Visit (HOSPITAL_COMMUNITY): Payer: Self-pay

## 2024-04-22 ENCOUNTER — Other Ambulatory Visit (HOSPITAL_COMMUNITY): Payer: Self-pay

## 2024-04-29 NOTE — Progress Notes (Unsigned)
   I, Claretha Schimke am a scribe for Dr. Artist Lloyd, MD.  Tracy Weiss is a 58 y.o. female who presents to [facility] at [facility] today for expedited f/u post-concussion syndrome, anxiety, and ADHD. Pt was last seen by Dr. Lloyd on 03/09/24 and was advised to switch back to extended release methylphenidate  and Vyvanse . Also advised to f/u w/ [facility] and PCP.   Today, pt reports slowing down of attention at work. Concerns with job not complying with the requirements from the letter. Need to go back to the clinic that she went to for the medication. Wants a new letter.   She is having significant difficulty at work with a lot of noise in her work environment.  They are not providing her with a quiet workspace.  She can work from home 1 or 2 days a week but that is not working well enough.  Pertinent review of systems: No fevers or chills  Relevant historical information: Postconcussion syndrome   Exam:  BP 110/70   Pulse 88   Ht 5' 1.5 (1.562 m)   Wt 142 lb (64.4 kg)   SpO2 100%   BMI 26.40 kg/m  General: Well Developed, well nourished, and in no acute distress.   Neuropsych alert and oriented normal speech thought process and affect.      Assessment and Plan: 58 y.o. female with postconcussion syndrome generally doing pretty well.  Today's visit was focused around workplace accommodations.  She is gena send us  a MyChart message on Monday or Tuesday of next week to help clarify exactly what the letter may need to say but the basics of it should be something to the effect of we will need a private workspace were she can close the door to reduce noise or the ability to work from home.  A cubicle with a partition is not going to be sufficient given the significant difficulty she has with distracting noise at work.   PDMP not reviewed this encounter. No orders of the defined types were placed in this encounter.  No orders of the defined  types were placed in this encounter.    Discussed warning signs or symptoms. Please see discharge instructions. Patient expresses understanding.   The above documentation has been reviewed and is accurate and complete Artist Lloyd, M.D.

## 2024-04-30 ENCOUNTER — Ambulatory Visit: Admitting: Family Medicine

## 2024-04-30 ENCOUNTER — Encounter: Payer: Self-pay | Admitting: Family Medicine

## 2024-04-30 VITALS — BP 110/70 | HR 88 | Ht 61.5 in | Wt 142.0 lb

## 2024-04-30 DIAGNOSIS — F900 Attention-deficit hyperactivity disorder, predominantly inattentive type: Secondary | ICD-10-CM | POA: Diagnosis not present

## 2024-04-30 DIAGNOSIS — F0781 Postconcussional syndrome: Secondary | ICD-10-CM | POA: Diagnosis not present

## 2024-04-30 NOTE — Patient Instructions (Signed)
 Thank you for coming in today.    We can work on a Physicist, medical.   I suggest having a private work space where you can close a door or ability to work from home.   Let me know with a mychart message.

## 2024-05-08 ENCOUNTER — Telehealth: Payer: Self-pay

## 2024-05-08 NOTE — Telephone Encounter (Signed)
Pt called, requesting a call back.

## 2024-05-08 NOTE — Telephone Encounter (Signed)
 Spoke with patient, she does not feel that work is providing the recommended accommodations. Has several concerns. Needs new letter.

## 2024-05-12 NOTE — Telephone Encounter (Signed)
 Rough draft of letters e-mail to pt, per pt request. She will review and let me know which options works best.

## 2024-05-15 ENCOUNTER — Other Ambulatory Visit: Payer: Self-pay | Admitting: Family Medicine

## 2024-05-15 ENCOUNTER — Other Ambulatory Visit (HOSPITAL_COMMUNITY): Payer: Self-pay

## 2024-05-15 MED ORDER — LISDEXAMFETAMINE DIMESYLATE 30 MG PO CAPS
30.0000 mg | ORAL_CAPSULE | Freq: Every day | ORAL | 0 refills | Status: AC
  Filled 2024-07-23: qty 30, 30d supply, fill #0

## 2024-06-09 ENCOUNTER — Encounter: Admitting: Family Medicine

## 2024-06-12 ENCOUNTER — Encounter: Admitting: Family Medicine

## 2024-06-18 NOTE — Progress Notes (Signed)
   I, Leotis Batter, CMA acting as a scribe for Artist Lloyd, MD.  Tracy Weiss is a 58 y.o. female who presents to [facility] at [facility] today for f/u post-concussion syndrome, anxiety, and ADHD. Pt was last seen by Dr. Lloyd on 04/30/24 and he work place accommodations were updated.  Today, pt reports worsening anxiety and headache. She is struggling with workplace accommodations with light and noise.  Pertinent review of systems: No fevers or chills  Relevant historical information: Hypertension   Exam:  BP 120/82   Pulse 92   Ht 5' 1.5 (1.562 m)   Wt 139 lb (63 kg)   SpO2 98%   BMI 25.84 kg/m  General: Well Developed, well nourished, and in no acute distress.  Neuropsych: Alert and oriented normal speech thought process and affect.    Lab and Radiology Results No results found for this or any previous visit (from the past 72 hours). No results found.     Assessment and Plan: 58 y.o. female with worsening headache and anxiety secondary effect from postconcussion syndrome.  She had a brain MRI a year ago that did have some white matter changes.  With worsening headache and the prior findings on brain MRI we will go ahead and update brain MRI it now to get a better sense for what could be causing her worsening symptoms.  Additionally talked about anxiety.  She is interested in counseling.  Provided some resource information.  We did talk about medication options.  In the past she has been on SSRIs but had trouble tolerating them.  She does have a prescription for alprazolam  but takes them very infrequently typically.  She has had using a little more frequently recently.  Workplace accommodations: Spent some time talking about some strategies.  Will update a letter. PDMP not reviewed this encounter. Orders Placed This Encounter  Procedures   MR BRAIN WO CONTRAST    Standing Status:   Future    Expiration Date:   06/19/2025    What is the  patient's sedation requirement?:   No Sedation    Does the patient have a pacemaker or implanted devices?:   No    Preferred imaging location?:   [facility] (table limit-550lbs)   No orders of the defined types were placed in this encounter.    Discussed warning signs or symptoms. Please see discharge instructions. Patient expresses understanding.   The above documentation has been reviewed and is accurate and complete Artist Lloyd, M.D. Total encounter time 30 minutes including face-to-face time with the patient and, reviewing past medical record, and charting on the date of service.

## 2024-06-19 ENCOUNTER — Encounter: Payer: Self-pay | Admitting: Family Medicine

## 2024-06-19 ENCOUNTER — Ambulatory Visit: Admitting: Family Medicine

## 2024-06-19 VITALS — BP 120/82 | HR 92 | Ht 61.5 in | Wt 139.0 lb

## 2024-06-19 DIAGNOSIS — R519 Headache, unspecified: Secondary | ICD-10-CM | POA: Diagnosis not present

## 2024-06-19 DIAGNOSIS — F411 Generalized anxiety disorder: Secondary | ICD-10-CM

## 2024-06-19 DIAGNOSIS — F0781 Postconcussional syndrome: Secondary | ICD-10-CM | POA: Diagnosis not present

## 2024-06-19 DIAGNOSIS — G8929 Other chronic pain: Secondary | ICD-10-CM

## 2024-07-14 ENCOUNTER — Ambulatory Visit
Admission: RE | Admit: 2024-07-14 | Discharge: 2024-07-14 | Disposition: A | Discharge status: Home / Self Care | Source: Ambulatory Visit | Attending: Family Medicine | Admitting: Family Medicine

## 2024-07-14 DIAGNOSIS — R519 Headache, unspecified: Secondary | ICD-10-CM

## 2024-07-14 DIAGNOSIS — F0781 Postconcussional syndrome: Secondary | ICD-10-CM

## 2024-07-20 ENCOUNTER — Ambulatory Visit: Payer: Self-pay | Admitting: Family Medicine

## 2024-07-20 NOTE — Progress Notes (Signed)
 Brain MRI is unchanged from prior.  This is good news.

## 2024-07-23 ENCOUNTER — Other Ambulatory Visit (HOSPITAL_COMMUNITY): Payer: Self-pay

## 2024-07-27 ENCOUNTER — Other Ambulatory Visit: Payer: Self-pay

## 2024-07-27 ENCOUNTER — Other Ambulatory Visit (HOSPITAL_COMMUNITY): Payer: Self-pay

## 2024-07-27 MED ORDER — MOUNJARO 15 MG/0.5ML ~~LOC~~ SOAJ
15.0000 mg | SUBCUTANEOUS | 3 refills | Status: AC
  Filled 2024-07-27 – 2024-08-21 (×3): qty 2, 28d supply, fill #0
  Filled 2024-10-06: qty 2, 28d supply, fill #1

## 2024-07-27 MED ORDER — LISDEXAMFETAMINE DIMESYLATE 70 MG PO CAPS
70.0000 mg | ORAL_CAPSULE | Freq: Every morning | ORAL | 0 refills | Status: AC
  Filled 2024-08-26 – 2024-10-06 (×2): qty 30, 30d supply, fill #0

## 2024-07-27 MED ORDER — METFORMIN HCL ER 500 MG PO TB24
ORAL_TABLET | ORAL | 3 refills | Status: AC
  Filled 2024-07-27: qty 30, 30d supply, fill #0
  Filled 2024-10-06 – 2024-10-08 (×2): qty 30, 30d supply, fill #1

## 2024-07-27 MED ORDER — OLMESARTAN MEDOXOMIL 5 MG PO TABS
5.0000 mg | ORAL_TABLET | Freq: Every day | ORAL | 3 refills | Status: AC
  Filled 2024-07-27 – 2024-10-06 (×2): qty 30, 30d supply, fill #0

## 2024-07-27 MED ORDER — LISDEXAMFETAMINE DIMESYLATE 70 MG PO CAPS
70.0000 mg | ORAL_CAPSULE | Freq: Every day | ORAL | 0 refills | Status: AC
  Filled 2024-07-27: qty 30, 30d supply, fill #0

## 2024-07-27 MED ORDER — ROSUVASTATIN CALCIUM 40 MG PO TABS
40.0000 mg | ORAL_TABLET | Freq: Every day | ORAL | 3 refills | Status: AC
  Filled 2024-07-27 – 2024-10-06 (×2): qty 30, 30d supply, fill #0

## 2024-07-27 MED ORDER — HYDROCHLOROTHIAZIDE 12.5 MG PO TABS
12.5000 mg | ORAL_TABLET | Freq: Every day | ORAL | 5 refills | Status: AC | PRN
  Filled 2024-07-27: qty 30, 30d supply, fill #0
  Filled 2024-10-06: qty 30, 30d supply, fill #1

## 2024-07-28 ENCOUNTER — Other Ambulatory Visit (HOSPITAL_COMMUNITY): Payer: Self-pay

## 2024-07-29 ENCOUNTER — Other Ambulatory Visit (HOSPITAL_COMMUNITY): Payer: Self-pay

## 2024-08-20 ENCOUNTER — Other Ambulatory Visit (HOSPITAL_COMMUNITY): Payer: Self-pay

## 2024-08-20 NOTE — Progress Notes (Unsigned)
° °  LILLETTE Ileana Collet, PhD, LAT, ATC acting as a scribe for Artist Lloyd, MD.  Tracy Weiss Tracy Weiss is a 58 y.o. female who presents to [facility] at [facility] today for f/u post-concussion syndrome, anxiety, and ADHD. Pt was last seen by Dr. Lloyd on 06/19/24 and a brain MRI was ordered. Pt was also provided some resources on counseling.  Today, pt reports ***  Pertinent review of systems: ***  Relevant historical information: ***   Exam:  There were no vitals taken for this visit. General: Well Developed, well nourished, and in no acute distress.   MSK: ***    Lab and Radiology Results No results found for this or any previous visit (from the past 72 hours). No results found.     Assessment and Plan: 58 y.o. female with ***   PDMP not reviewed this encounter. No orders of the defined types were placed in this encounter.  No orders of the defined types were placed in this encounter.    Discussed warning signs or symptoms. Please see discharge instructions. Patient expresses understanding.   ***

## 2024-08-21 ENCOUNTER — Ambulatory Visit: Admitting: Family Medicine

## 2024-08-21 ENCOUNTER — Other Ambulatory Visit (HOSPITAL_COMMUNITY): Payer: Self-pay

## 2024-08-21 VITALS — BP 128/84 | HR 89 | Ht 61.5 in | Wt 143.0 lb

## 2024-08-21 DIAGNOSIS — F900 Attention-deficit hyperactivity disorder, predominantly inattentive type: Secondary | ICD-10-CM | POA: Diagnosis not present

## 2024-08-21 DIAGNOSIS — F0781 Postconcussional syndrome: Secondary | ICD-10-CM

## 2024-08-21 NOTE — Patient Instructions (Addendum)
 Thank you for coming in today.   Aphantasia.  Recheck in 6 months.

## 2024-08-24 ENCOUNTER — Other Ambulatory Visit (HOSPITAL_COMMUNITY): Payer: Self-pay

## 2024-08-26 ENCOUNTER — Other Ambulatory Visit: Payer: Self-pay

## 2024-08-26 ENCOUNTER — Other Ambulatory Visit (HOSPITAL_COMMUNITY): Payer: Self-pay

## 2024-09-11 ENCOUNTER — Other Ambulatory Visit (HOSPITAL_COMMUNITY): Payer: Self-pay

## 2024-10-06 ENCOUNTER — Other Ambulatory Visit (HOSPITAL_COMMUNITY): Payer: Self-pay

## 2024-10-07 ENCOUNTER — Other Ambulatory Visit (HOSPITAL_COMMUNITY): Payer: Self-pay
